# Patient Record
Sex: Female | Born: 1955 | Race: White | Hispanic: No | Smoking: Former smoker
Health system: Southern US, Community
[De-identification: ages and names within clinical notes are randomized; demographics above are authoritative.]

## PROBLEM LIST (undated history)

## (undated) DIAGNOSIS — E119 Type 2 diabetes mellitus without complications: Secondary | ICD-10-CM

## (undated) DIAGNOSIS — J40 Bronchitis, not specified as acute or chronic: Secondary | ICD-10-CM

## (undated) DIAGNOSIS — Z9889 Other specified postprocedural states: Secondary | ICD-10-CM

## (undated) DIAGNOSIS — C801 Malignant (primary) neoplasm, unspecified: Secondary | ICD-10-CM

## (undated) DIAGNOSIS — I639 Cerebral infarction, unspecified: Secondary | ICD-10-CM

## (undated) DIAGNOSIS — K922 Gastrointestinal hemorrhage, unspecified: Secondary | ICD-10-CM

## (undated) DIAGNOSIS — N200 Calculus of kidney: Secondary | ICD-10-CM

## (undated) DIAGNOSIS — F32A Depression, unspecified: Secondary | ICD-10-CM

## (undated) DIAGNOSIS — F329 Major depressive disorder, single episode, unspecified: Secondary | ICD-10-CM

## (undated) DIAGNOSIS — J189 Pneumonia, unspecified organism: Secondary | ICD-10-CM

## (undated) DIAGNOSIS — K219 Gastro-esophageal reflux disease without esophagitis: Secondary | ICD-10-CM

## (undated) DIAGNOSIS — K859 Acute pancreatitis without necrosis or infection, unspecified: Secondary | ICD-10-CM

## (undated) DIAGNOSIS — I10 Essential (primary) hypertension: Secondary | ICD-10-CM

## (undated) DIAGNOSIS — E7800 Pure hypercholesterolemia, unspecified: Secondary | ICD-10-CM

## (undated) DIAGNOSIS — G4730 Sleep apnea, unspecified: Secondary | ICD-10-CM

## (undated) DIAGNOSIS — T4145XA Adverse effect of unspecified anesthetic, initial encounter: Secondary | ICD-10-CM

## (undated) DIAGNOSIS — Z86010 Personal history of colonic polyps: Secondary | ICD-10-CM

## (undated) DIAGNOSIS — K3189 Other diseases of stomach and duodenum: Secondary | ICD-10-CM

## (undated) HISTORY — PX: HERNIA REPAIR: SHX51

## (undated) HISTORY — PX: APPENDECTOMY: SHX54

## (undated) HISTORY — PX: CHOLECYSTECTOMY: SHX55

## (undated) HISTORY — DX: Calculus of kidney: N20.0

---

## 1986-10-05 HISTORY — PX: TONSILLECTOMY AND ADENOIDECTOMY: SUR1326

## 1998-11-22 ENCOUNTER — Inpatient Hospital Stay (HOSPITAL_COMMUNITY): Admission: EM | Admit: 1998-11-22 | Discharge: 1998-11-25 | Payer: Self-pay | Admitting: Emergency Medicine

## 1998-11-22 ENCOUNTER — Encounter: Payer: Self-pay | Admitting: Internal Medicine

## 1998-11-22 ENCOUNTER — Encounter: Payer: Self-pay | Admitting: Emergency Medicine

## 1998-11-28 ENCOUNTER — Ambulatory Visit (HOSPITAL_COMMUNITY): Admission: RE | Admit: 1998-11-28 | Discharge: 1998-11-28 | Payer: Self-pay | Admitting: Gastroenterology

## 1999-01-15 ENCOUNTER — Ambulatory Visit (HOSPITAL_COMMUNITY): Admission: RE | Admit: 1999-01-15 | Discharge: 1999-01-15 | Payer: Self-pay | Admitting: Gastroenterology

## 1999-01-15 ENCOUNTER — Encounter: Payer: Self-pay | Admitting: Gastroenterology

## 1999-11-07 ENCOUNTER — Emergency Department (HOSPITAL_COMMUNITY): Admission: EM | Admit: 1999-11-07 | Discharge: 1999-11-07 | Payer: Self-pay | Admitting: Emergency Medicine

## 2000-10-05 HISTORY — PX: ABDOMINAL HYSTERECTOMY: SHX81

## 2000-10-26 ENCOUNTER — Inpatient Hospital Stay (HOSPITAL_COMMUNITY): Admission: EM | Admit: 2000-10-26 | Discharge: 2000-10-30 | Payer: Self-pay | Admitting: Emergency Medicine

## 2000-10-26 ENCOUNTER — Encounter: Payer: Self-pay | Admitting: Emergency Medicine

## 2000-10-30 ENCOUNTER — Inpatient Hospital Stay (HOSPITAL_COMMUNITY): Admission: EM | Admit: 2000-10-30 | Discharge: 2000-10-31 | Payer: Self-pay | Admitting: Emergency Medicine

## 2000-11-04 ENCOUNTER — Ambulatory Visit (HOSPITAL_BASED_OUTPATIENT_CLINIC_OR_DEPARTMENT_OTHER): Admission: RE | Admit: 2000-11-04 | Discharge: 2000-11-04 | Payer: Self-pay | Admitting: Pulmonary Disease

## 2000-11-16 ENCOUNTER — Emergency Department (HOSPITAL_COMMUNITY): Admission: EM | Admit: 2000-11-16 | Discharge: 2000-11-16 | Payer: Self-pay | Admitting: Emergency Medicine

## 2000-11-30 ENCOUNTER — Encounter (INDEPENDENT_AMBULATORY_CARE_PROVIDER_SITE_OTHER): Payer: Self-pay

## 2000-11-30 ENCOUNTER — Ambulatory Visit (HOSPITAL_COMMUNITY): Admission: RE | Admit: 2000-11-30 | Discharge: 2000-11-30 | Payer: Self-pay | Admitting: Gastroenterology

## 2001-03-09 ENCOUNTER — Encounter: Payer: Self-pay | Admitting: Emergency Medicine

## 2001-03-09 ENCOUNTER — Emergency Department (HOSPITAL_COMMUNITY): Admission: EM | Admit: 2001-03-09 | Discharge: 2001-03-09 | Payer: Self-pay | Admitting: Emergency Medicine

## 2001-06-21 ENCOUNTER — Encounter: Payer: Self-pay | Admitting: Anesthesiology

## 2001-06-23 ENCOUNTER — Encounter (INDEPENDENT_AMBULATORY_CARE_PROVIDER_SITE_OTHER): Payer: Self-pay | Admitting: Specialist

## 2001-06-23 ENCOUNTER — Ambulatory Visit (HOSPITAL_COMMUNITY): Admission: RE | Admit: 2001-06-23 | Discharge: 2001-06-23 | Payer: Self-pay | Admitting: Urology

## 2002-02-09 ENCOUNTER — Inpatient Hospital Stay (HOSPITAL_COMMUNITY): Admission: EM | Admit: 2002-02-09 | Discharge: 2002-02-14 | Payer: Self-pay | Admitting: Internal Medicine

## 2002-06-27 ENCOUNTER — Emergency Department (HOSPITAL_COMMUNITY): Admission: EM | Admit: 2002-06-27 | Discharge: 2002-06-27 | Payer: Self-pay | Admitting: Emergency Medicine

## 2002-10-12 ENCOUNTER — Ambulatory Visit (HOSPITAL_COMMUNITY): Admission: RE | Admit: 2002-10-12 | Discharge: 2002-10-12 | Payer: Self-pay | Admitting: *Deleted

## 2002-10-12 ENCOUNTER — Encounter: Payer: Self-pay | Admitting: *Deleted

## 2002-11-02 ENCOUNTER — Encounter: Payer: Self-pay | Admitting: *Deleted

## 2002-11-03 ENCOUNTER — Ambulatory Visit (HOSPITAL_COMMUNITY): Admission: RE | Admit: 2002-11-03 | Discharge: 2002-11-04 | Payer: Self-pay | Admitting: *Deleted

## 2002-11-03 ENCOUNTER — Encounter (INDEPENDENT_AMBULATORY_CARE_PROVIDER_SITE_OTHER): Payer: Self-pay | Admitting: *Deleted

## 2002-12-12 ENCOUNTER — Emergency Department (HOSPITAL_COMMUNITY): Admission: EM | Admit: 2002-12-12 | Discharge: 2002-12-12 | Payer: Self-pay | Admitting: Emergency Medicine

## 2002-12-12 ENCOUNTER — Encounter: Payer: Self-pay | Admitting: Emergency Medicine

## 2002-12-29 ENCOUNTER — Encounter: Admission: RE | Admit: 2002-12-29 | Discharge: 2002-12-29 | Payer: Self-pay | Admitting: Family Medicine

## 2002-12-29 ENCOUNTER — Encounter: Payer: Self-pay | Admitting: Gastroenterology

## 2003-01-10 ENCOUNTER — Other Ambulatory Visit: Admission: RE | Admit: 2003-01-10 | Discharge: 2003-01-10 | Payer: Self-pay | Admitting: Gastroenterology

## 2003-01-16 ENCOUNTER — Encounter: Payer: Self-pay | Admitting: Gastroenterology

## 2003-01-16 ENCOUNTER — Ambulatory Visit (HOSPITAL_COMMUNITY): Admission: RE | Admit: 2003-01-16 | Discharge: 2003-01-16 | Payer: Self-pay | Admitting: Gastroenterology

## 2003-06-13 ENCOUNTER — Observation Stay (HOSPITAL_COMMUNITY): Admission: EM | Admit: 2003-06-13 | Discharge: 2003-06-14 | Payer: Self-pay | Admitting: Emergency Medicine

## 2003-06-13 ENCOUNTER — Encounter: Payer: Self-pay | Admitting: *Deleted

## 2003-06-13 ENCOUNTER — Encounter: Payer: Self-pay | Admitting: Emergency Medicine

## 2004-08-15 ENCOUNTER — Ambulatory Visit: Payer: Self-pay | Admitting: Family Medicine

## 2004-08-25 ENCOUNTER — Ambulatory Visit: Payer: Self-pay | Admitting: Family Medicine

## 2004-09-22 ENCOUNTER — Ambulatory Visit: Payer: Self-pay | Admitting: Family Medicine

## 2004-10-22 ENCOUNTER — Ambulatory Visit: Payer: Self-pay | Admitting: Family Medicine

## 2004-11-04 ENCOUNTER — Ambulatory Visit: Payer: Self-pay | Admitting: Family Medicine

## 2005-01-01 ENCOUNTER — Ambulatory Visit: Payer: Self-pay | Admitting: Family Medicine

## 2005-03-04 ENCOUNTER — Ambulatory Visit: Payer: Self-pay | Admitting: Family Medicine

## 2005-03-31 ENCOUNTER — Ambulatory Visit: Payer: Self-pay | Admitting: Family Medicine

## 2005-04-13 ENCOUNTER — Ambulatory Visit: Payer: Self-pay | Admitting: Family Medicine

## 2005-05-12 ENCOUNTER — Ambulatory Visit: Payer: Self-pay | Admitting: Family Medicine

## 2005-05-26 ENCOUNTER — Ambulatory Visit: Payer: Self-pay | Admitting: Family Medicine

## 2005-06-03 ENCOUNTER — Ambulatory Visit: Payer: Self-pay | Admitting: Cardiology

## 2005-07-17 ENCOUNTER — Ambulatory Visit: Payer: Self-pay | Admitting: Family Medicine

## 2005-08-04 ENCOUNTER — Ambulatory Visit: Payer: Self-pay | Admitting: Family Medicine

## 2005-10-29 ENCOUNTER — Ambulatory Visit: Payer: Self-pay | Admitting: Family Medicine

## 2005-11-16 ENCOUNTER — Ambulatory Visit: Payer: Self-pay | Admitting: Family Medicine

## 2006-01-05 ENCOUNTER — Ambulatory Visit: Payer: Self-pay | Admitting: Family Medicine

## 2006-02-18 ENCOUNTER — Ambulatory Visit: Payer: Self-pay | Admitting: Family Medicine

## 2006-04-12 ENCOUNTER — Ambulatory Visit: Payer: Self-pay | Admitting: Family Medicine

## 2006-05-06 ENCOUNTER — Ambulatory Visit: Payer: Self-pay | Admitting: Family Medicine

## 2006-06-15 ENCOUNTER — Ambulatory Visit: Payer: Self-pay | Admitting: Family Medicine

## 2006-08-11 ENCOUNTER — Ambulatory Visit: Payer: Self-pay | Admitting: Family Medicine

## 2006-09-24 ENCOUNTER — Ambulatory Visit: Payer: Self-pay | Admitting: Family Medicine

## 2006-12-06 ENCOUNTER — Ambulatory Visit: Payer: Self-pay | Admitting: Family Medicine

## 2006-12-24 ENCOUNTER — Ambulatory Visit: Payer: Self-pay | Admitting: Family Medicine

## 2007-02-10 ENCOUNTER — Ambulatory Visit: Payer: Self-pay | Admitting: Family Medicine

## 2007-03-07 ENCOUNTER — Ambulatory Visit: Payer: Self-pay | Admitting: Family Medicine

## 2007-08-17 ENCOUNTER — Ambulatory Visit (HOSPITAL_COMMUNITY): Admission: RE | Admit: 2007-08-17 | Discharge: 2007-08-17 | Payer: Self-pay | Admitting: Family Medicine

## 2009-04-25 ENCOUNTER — Emergency Department (HOSPITAL_COMMUNITY): Admission: EM | Admit: 2009-04-25 | Discharge: 2009-04-25 | Payer: Self-pay | Admitting: Emergency Medicine

## 2009-07-05 ENCOUNTER — Emergency Department (HOSPITAL_COMMUNITY): Admission: EM | Admit: 2009-07-05 | Discharge: 2009-07-05 | Payer: Self-pay | Admitting: Emergency Medicine

## 2011-01-11 LAB — BASIC METABOLIC PANEL WITH GFR
BUN: 10 mg/dL (ref 6–23)
CO2: 30 meq/L (ref 19–32)
Calcium: 9.7 mg/dL (ref 8.4–10.5)
Chloride: 102 meq/L (ref 96–112)
Creatinine, Ser: 0.57 mg/dL (ref 0.4–1.2)
GFR calc non Af Amer: >60 mL/min
Glucose, Bld: 124 mg/dL — ABNORMAL HIGH (ref 70–99)
Potassium: 3.7 meq/L (ref 3.5–5.1)
Sodium: 143 meq/L (ref 135–145)

## 2011-01-11 LAB — URINALYSIS, ROUTINE W REFLEX MICROSCOPIC
Bilirubin Urine: NEGATIVE
Glucose, UA: NEGATIVE mg/dL
Ketones, ur: NEGATIVE mg/dL
Nitrite: NEGATIVE
Protein, ur: NEGATIVE mg/dL
Specific Gravity, Urine: 1.01 (ref 1.005–1.030)
Urobilinogen, UA: 0.2 mg/dL (ref 0.0–1.0)
pH: 6 (ref 5.0–8.0)

## 2011-01-11 LAB — DIFFERENTIAL
Basophils Absolute: 0 10*3/uL (ref 0.0–0.1)
Basophils Relative: 0 % (ref 0–1)
Eosinophils Absolute: 0.1 10*3/uL (ref 0.0–0.7)
Eosinophils Relative: 1 % (ref 0–5)
Lymphocytes Relative: 27 % (ref 12–46)
Lymphs Abs: 1.8 10*3/uL (ref 0.7–4.0)
Monocytes Absolute: 0.4 10*3/uL (ref 0.1–1.0)
Monocytes Relative: 6 % (ref 3–12)
Neutro Abs: 4.4 10*3/uL (ref 1.7–7.7)
Neutrophils Relative %: 66 % (ref 43–77)

## 2011-01-11 LAB — CBC
Hemoglobin: 14.5 g/dL (ref 12.0–15.0)
MCHC: 34.3 g/dL (ref 30.0–36.0)
MCV: 86.5 fL (ref 78.0–100.0)
RBC: 4.88 MIL/uL (ref 3.87–5.11)

## 2011-02-20 NOTE — Op Note (Signed)
NAME:  Destiny Robinson, SACHS NO.:  [id number]   MEDICAL RECORD NO.:  [id number]                   PATIENT TYPE:  OIB   LOCATION:  2872                                 FACILITY:  [facility]   PHYSICIAN:  Veverly Fells. Arletha Grippe, M.D.             DATE OF BIRTH:  [birth year]   DATE OF PROCEDURE:  11/03/2002  DATE OF DISCHARGE:                                 OPERATIVE REPORT   PREOPERATIVE DIAGNOSIS:  Nasal airway obstruction, obstructive sleep apnea,  recurrent and persistent sinusitis involving the ethmoid and maxillary sinus  cavities.   POSTOPERATIVE DIAGNOSIS:  Nasal airway obstruction, obstructive sleep apnea,  recurrent and persistent sinusitis involving the ethmoid and maxillary sinus  cavities.   PROCEDURE:  Nasal septal reconstruction, bilateral endoscopic anterior  ethmoidectomies, and bilateral endoscopic maxillary antrostomies using the  Stealth Station for stereotactic assisted navigation.   SURGEON:  Veverly Fells. Arletha Grippe, M.D.   ANESTHESIA:  General endotracheal.   INDICATIONS FOR PROCEDURE:  This is a 55 year old white female with a  history of mild obstructive sleep apnea with significant oxygen  desaturations currently using nasal CPAP.  Does have a history of nasal  airway obstruction and does have a problem with recurrent sinusitis being on  numerous courses of antibiotics throughout the year.  Physical examination  in the office did show a significant septal deviation to the right with  mucosal thickening of the inferior turbinates bilaterally.  Post treatment  CT scan of the sinus showed minimal maxillary sinus disease.  Based on her  history and physical examination and history of recurrent sinusitis, I have  recommended proceeding with the above noted surgical procedure.  I have  discussed with her the risks and benefits of surgery including risks of  general anesthesia, infection, bleeding, CNS injury, need for septal  splinting, light nasal  packing, and a normal recovery period expected after  this type of surgery. I have entertained any questions, answered then  appropriately, informed consent has been obtained, and the patient presents  for above noted procedure.   FINDINGS:  Septal deviation right lateral in nature, some thickening of the  lining of the ethmoid and maxillary sinuses bilaterally.   DESCRIPTION OF PROCEDURE:  The patient was brought to the operating room and  placed in the supine position.  General endotracheal anesthesia administered  via the anesthesiologist without complications. The patient was administered  600 mg of Clindamycin IV x1 and 10 mg of Decadron IV x1.  The head of the  table was elevated 30 degrees.  A throat pack was placed in the posterior  pharynx using a small baby sponge, bilateral sphenopalatine blocks were  administered via the greater palatine foramen each with 1.5 ml of 1%  lidocaine solution with 1:100,000 epinephrine.  The Stealth Station Head  Piece was placed on the patient's head and was calibrated  to [employer]  suction probe and a [employer]  probe per protocol. This was used throughout  the entire case to identify laminar arch and lamina, and the patient's skull  base which were not violated at any time.   The patient's face was draped in a standard fashion.  Pledgets soaked in a  4% cocaine solution were placed in both nares and left in place for  approximately 5 to 10 minutes and then removed.  Both sites septum were  infiltrated with 1% lidocaine solution with 1:100,000 epinephrine.  After  waiting approximately 10 minutes a standard Seldinger incision was made on  the left side of the septum, mucoperichondrial and mucoperiosteal flap was  elevated on the left side using both blunt and sharp dissection.  An  intercartilaginous incision was made approximately 1 cm posterior from the  caudal limb of the septum.  The mucoperichondrial and mucoperiosteal flap  was elevated on  the right side using both biopsies.  Cartilaginous deviation  was removed and posterior bony deflection removed with Jansen-Middleton  forceps.  Septal flaps were placed back in normal position using 0 degree  ridge endoscopic guidance.  The anterior portion of both middle turbinates  and uncinate processes were injected with a 1% lidocaine solution with  1:1000 epinephrine.  Cotton pledgets soaked in a 4% cocaine solution were  placed in both middle meatus which were left in place approximately 5 to 10  minutes and then removed.  Next, attention was turned to the left nasal  chamber.  Using 0 degree ridge endoscopic guidance, the uncinate process was  back elevated using the micro elevator and uncinate performed with  combination of two cutting forceps and the micro debrider without  difficulty.  The antral sinus ostium and anterior sinus was identified and  was enlarged using through cutting forceps and the micro debrider.  Some  thick secretions were removed through the enlarged ostium with suction  without difficulty.  A 30 and then a 70 degree rigid endoscope was used to  identify the contents of the maxillary sinus.  No other lesions or masses  were noted and no further biopsies were taken.  Next using through cutting  forceps, micro debrider, 0 degree endoscopic guidance, and the Stealth  Station for guidance, an ethmoidectomy was performed by removing the ethmoid  bulla up to skull base.  Since there was no significant disease posteriorly,  a posterior ethmoidectomy was not performed.  Next, attention was turned to  the right nasal chamber. Identical procedure was carried out on this side  compared to the left side with identical results.  After this was done,  sinus cavities were reinspected and there was no evidence of any active  bleeding.  A piece of trimmed morselized cartilage was placed in between the septal flaps. Septal incision was closed with interrupted chromic suture  and  the septum was reinforced with a running septal plain gut mattress stitch.  A pack soaked in a Bactroban ointment solution placed in both middle meatus.  Both inferior turbinates were injected with a total of 6 ml of 1% lidocaine  solution with 1:1000 epinephrine and both inferior turbinates were then  intramurally cauterized using the Elmed instrument cauterization unit using  a 12 watt setting.  Three passes of both inferior turbinates performed  without difficulty and both inferior turbinates were then outfractured using  gentle pressure with a large nasal speculum.  Doyle foam soaked in a  Bactroban ointment solution was placed on either side of the septum and held  in place  with a trans septal Prolene suture. Throat pack was removed.  Oral  gastric tube was placed and was used to decompress the stomach contents.  It  was then removed without incident.  Fluids given during the procedure was  approximately 1500 ml of crystalloid. Estimated blood loss was less than 50  ml.  There were no drains.  Two Doyle splints were placed.  Specimens sent  were sinus contents for culture, sensitivity, and pathology.  The patient  tolerated the procedure well without complications, was extubated in the  operating room, and transferred to the recovery room in stable condition.  Needle, sponge, and instrument count correct.  Total duration of the  procedure was approximately two hours.  The patient will be admitted for  overnight recovery.  Once she recovers well, she will be sent home on  November 04, 2002.  She will be sent home on Levaquin 500 mg p.o. daily for  14 days and Vicodin #30 with two refills one to two tablets p.o. q.4h p.r.n.  pain. She is to have light activity, no heavy lifting or nose blowing for  two weeks after surgery. Both  her and her family are given oral and written instructions. They are to call  with any problems with bleeding, fever, or vomiting, pain, medications, or   any other questions. She will follow up in the office for splint pack  removal and bilateral endoscopic debridement of sinus cavities on Thursday,  February 5, at 3:25 p.m.                                               Veverly Fells. Arletha Grippe, M.D.    MDR/MEDQ  D:  11/03/2002  T:  11/03/2002  Job:  [id number]   cc:   Delaney Meigs, M.D.  [address]  [place]  [place] [address]  Fax: [phone]   E. Verdis Prime., M.D.  [address] [place]  [place] [address]  Fax: [phone]

## 2011-02-20 NOTE — Op Note (Signed)
[place]. [facility]  Patient:    Destiny Robinson, Destiny Robinson Visit Number: [id number] MRN: [id number]          Service Type: DSU Location: DAY Attending Physician:  Laqueta Jean Dictated by:   Vonzell Schlatter Patsi Sears, M.D. Admit Date:  06/23/2001                             Operative Report  PREOPERATIVE DIAGNOSIS:  Interstitial cystitis.  POSTOPERATIVE DIAGNOSIS:  Interstitial cystitis.  OPERATION:  Cystourethroscopy, urethral dilation, hydrodistention of the bladder, bladder biopsy, insertion of Marcaine and Pyridium in the bladder, injection of Marcaine and Kenalog in the periurethral space.  SURGEON:  Sigmund I. Patsi Sears, M.D.  ANESTHESIA:  General endotracheal anesthesia.  REVIEW OF HISTORY:  [name] is a 55 year old, obese female, weighing 210 pounds, complaining of continuous "pelvic cramping," with pain, spasm, for cysto, hydrodistention and bladder biopsy today.  She also has dyspareunia with specific pain in the top of the urethra, the back by the base of the bladder.  Her past history is significant for non-insulin-dependent diabetes, hypertension, sleep apnea, and GERD.  DESCRIPTION OF PROCEDURE:  After appropriate preanesthesia, the patient was brought to the operating room and placed on the operating table in the dorsal supine position where general endotracheal anesthesia was introduced. She was then replaced in the dorsal lithotomy position with the pubis was prepped with Betadine solution and draped in the usual fashion.  Urethral dilation was accomplished to a size 30-French urethral sounds and following this, cysto HRD was accomplished with 1000 cc bladder capacity. Following hydrodistention, bladder biopsy was accomplished and cauterized with the Bugbee electrode.  Pyridium and Marcaine was inserted into the bladder and and Marcaine and Kenalog were injected at the periurethral space.  A vaginal pack was placed which  will  be removed in the recovery room.  The patient was then awakened after being given Toradol and taken to the recovery room in good condition. Dictated by:   Vonzell Schlatter Patsi Sears, M.D. Attending Physician:  Laqueta Jean DD:  06/23/01 TD:  06/23/01 Job: [id number]

## 2011-02-20 NOTE — H&P (Signed)
[facility]  Patient:    Destiny Robinson, Destiny Robinson Visit Number: [id number] MRN: [id number]          Service Type: MED Location: [phone] Attending Physician:  Avie Echevaria Dictated by:   Charlaine Dalton. Sherene Sires, M.D. [facility] Admit Date:  02/09/2002   CC:         Dr. Lyndal Rainbow L. Annalee Genta, M.D.   History and Physical  CHIEF COMPLAINT:  Cough and fever.  HISTORY OF PRESENT ILLNESS:  This is a complicated 55 year old white female, former smoker, seen by me in the office on January 27, 2002, with a refractory cough that had developed in the setting of what initially was thought to be a "sinus infection" on December 12, 2001.  However, she received three different rounds of antibiotics and was seen by both Dr. Zion Callas and Dr. Arletha Grippe, and she reports that she has been told she has no evidence of active sinusitis.  I had evaluated her and found that she had a very functional upper airway cough with no evidence of definite active purulent infection.  On her third round of antibiotics at that time I recommended that she finish the antibiotics and not continue them.  Instead, I focused on management of the upper airway with high-dose Prevacid and made an effort to try to control what I considered to be a cyclical component of the cough.  She states that on this regimen she never completely stopped coughing, but it is not clear to me she actually followed the instructions that she was given.  For instance, she did not recognize several of the medicines that I had placed her on.  She comes back now much worse over the last 48 hours with hacking cough, generalized chest discomfort anteriorly with coughing, and coughing to the point that she vomits.  She also had fever of 101, she reports, last night.  Given the fact that she has just received three different rounds of antibiotics and the cough never really resolved with outpatient therapy, I feel she has failed outpatient  therapy and needs more aggressive inpatient measures at this time.  She denies any true pleuritic pain at this point, any abdominal complaints, active sinus pain, rigors, myalgias, arthralgias, or sore throat.  PAST MEDICAL HISTORY:  Obesity, in turn complicated by both diabetes and obstructive sleep apnea, on BiPAP.  She is also status post cholecystectomy and tonsillectomy.  Also with documented GERD with active esophagitis by most recent upper endoscopy dated here in the office on November 30, 2000.  MEDICATIONS:  1. Hydrochlorothiazide 25 mg q.d.  2. Cozaar 100 mg q.d.  3. Norvasc 5 mg q.d.  4. Lipitor 20 mg q.d.  5. Avandia 4 mg q.d.  6. Calcium 600 mg b.i.d.  7. Multivitamins daily.  8. Estraderm patches twice weekly.  9. Celexa 30 mg q.d. 10. Singulair 10 mg q.d. 11. Astelin 1 puff b.i.d. 12. Rhinocort 1 puff q.d. 13. Stool softeners daily. 14. She was supposed to be using Tussi-12 one q.12h., Drixoral Cold and     Allergy p.r.n. postnasal drainage, Mepergan 1 q.4h. p.r.n. excessive     cough, but does not think she actually received the Mepergan and, instead,     taking a cough syrup that "doesnt work" and not able to identify it.  SOCIAL HISTORY:  She quit smoking five years ago.  She works as a Lawyer at [employer].  She denies any unusual travel, pet, or hobbies.  FAMILY HISTORY:  Positive  for heart disease in her brother and mother. Negative for any form of atopy or asthma to her knowledge.  REVIEW OF SYSTEMS:  Taken in detail and essentially negative except for weight gain that she attributes to steroid exposure.  PHYSICAL EXAMINATION:  GENERAL:  Obese white female in no acute distress, with a very harsh, hacking upper airway cough that is almost a "bark."  VITAL SIGNS:  Most recent weight in our records is 217 pounds, compared to 205 when we first saw her in the pulmonary clinic in 2002.  HEENT:  Oropharynx is clear.  No evidence of active postnasal drainage  or cobblestoning.  NECK:  Supple.  Without cervical adenopathy or tenderness. Trachea midline. No thyromegaly.  LUNGS:  Lung fields reveal a few rhonchi bilaterally.  Air movement is adequate.  However, when she tries to breathe in she immediately goes into coughing fits.  HEART:  Regular rate and rhythm without murmur, gallop, or rub present.  ABDOMEN:  Soft, benign.  EXTREMITIES:  Warm, without calf tenderness, cyanosis, clubbing, or edema.  NEUROLOGIC:  No focal deficits ______ although her mood and affect were both anxious and depressed.  SKIN:  Warm and dry.  LABORATORY DATA:  Chest x-ray and sinus CT scan as well as laboratory work are all pending at the time of this dictation.  IMPRESSION:  Refractory chronic cough that really was not well controlled previously and probably was upper airway in nature (either related to poor control with reflux and/or rhinitis and/or both).  She is now much worse and will need admission to the hospital to work out the cause of fever, which most presumably is related to purulent sinusitis.  Although I do not have a chest x-ray, yet I strongly doubt that she has pneumonia.  Long-term, the major task ahead is to control the cough without the need for systemic steroids and to have her lose weight, which is the most likely reason she would become refractory to outpatient management for what I presume to be upper airway instability related to reflux.  The other possibility is that she needs more aggressive treatment of her sinuses, but will wait and see what the sinus evaluation yields.  For further information, please see orders. Dictated by:   Charlaine Dalton. Sherene Sires, M.D. [facility] Attending Physician:  Avie Echevaria DD:  02/09/02 TD:  02/11/02 Job: [phone] [id number]

## 2011-02-20 NOTE — Discharge Summary (Signed)
[place]. [facility]  Patient:    Destiny Robinson, Destiny Robinson                         MRN: [id number] Adm. Date:  [id number] Disc. Date: [id number] Attending:  Talitha Givens Dictator:   Dian Queen, P.A.C. [facility] CC:         Delaney Meigs, M.D.   Referring Physician Discharge [place]  HISTORY OF PRESENT ILLNESS:  Essentially the problem is that of a 55 year old, white, female diabetic with hypertension and hyperlipoproteinemia.  She quit smoking many years ago.  She is admitted now with sudden onset of sharp substernal chest pain beginning yesterday at work while walking, at which time she became very short of breath and had to sit down.  She tried to continue at work.  She has had some spells while driving.  She has spells noted by her husband when she seems to be unresponsive for a few seconds, during which time there is no apparent seizure activity.  She does not stop breathing.  There is a question of sleep apnea that has been raised in the past.  She is admitted for further evaluation and therapy.  LABORATORY WORK:  Blood gas revealing a pH of 7.44 with a pCO2 of 43 and a pO2 of 68.0 on 2 L.  Hemoglobin 13.3 with a hematocrit of 38.7 and white count of 7000.  Electrolytes and renal function totally normal.  CKs were low with negative MBs.  Troponins were normal.  The TSH was 0.441.  Helicobacter antibody 3.26.  Myocardial perfusion study revealed changes compatible with anterior wall ischemia with normal wall motion and ejection fraction of 55%.  A portable chest x-ray revealed no active disease.  A 2-D echocardiogram revealed normal valvular architecture and normal LV function.  Assessment of the right heart was difficult.  Lower extremity venous Dopplers were normal with no evidence of DVT.  HOSPITAL COURSE:  She was seen in consultation by pulmonary who felt that she had significant symptoms compatible with GERD.  A CT of the chest was obtained, which was  negative for pulmonary embolization.  It was felt that she probably had sleep apnea versus perhaps narcolepsy.  A sleep study was recommended.  She was advised not to drive until such was completed and further therapies outlined.  Because of her GERD symptoms, she was seen in consultation by gastroenterology.  Proton pump inhibitor was recommended, although they felt that much of her symptoms were probably musculoskeletal rather than GI in etiology.  To fully evaluate the potential for cardiac involvement, it was ultimately felt optimal to proceed with cardiac catheterization given the Cardiolite data as above.  She therefore underwent cardiac catheterization by Rollene Rotunda, M.D., in October 29, 2000, which revealed normal coronaries and normal LV function.  FINAL DIAGNOSES: 1. Noncardiac chest pain.  Normal coronary arteries and left ventricular    function.  No evidence of pulmonary embolization. 2. Suspected obstructed sleep apnea. 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 4. Diabetes. 5. Controlled hypertension. 6. Hyperlipoproteinemia. 7. Family history of coronary artery disease. 8. Remote history of cigarette use, quit years ago.  DISPOSITION:  The patient is discharged home.  DISCHARGE MEDICATIONS: 1. HCTZ 25 mg q.d. 2. Lipitor 20 mg q.d. 3. Avandia 4 mg q.d. 4. Norvasc 5 mg q.d. 5. Cozaar 100 mg q.d. 6. Climara patch 0.25 mg. 7. Protonix 40 mg b.i.d.  FOLLOW-UP:  She will with Delaney Meigs, M.D., long term, sleep  study as above, and then with Barbaraann Share, M.D., for therapy option discussion. DD:  10/30/00 TD:  10/30/00 Job: [id number]

## 2011-02-20 NOTE — Discharge Summary (Signed)
[place]. [facility]  Patient:    Destiny Robinson, Destiny Robinson                         MRN: [id number] Adm. Date:  [id number] Disc. Date: [id number] Attending:  Talitha Givens Dictator:   Tereso Newcomer, P.A. CC:         [facility]  Delaney Meigs, M.D.   Discharge Summary  DATE OF BIRTH:  [birth year]  DISCHARGE DIAGNOSES:  1. Right groin hematoma.  2. Diabetes mellitus type 2.  3. Hypertension.  4. Hyperlipidemia.  5. Obesity.  6. Hypoxia.  7. Hormone replacement therapy.  8. Remote tobacco use.  9. Gastroesophageal reflux disease. 10. Family history of coronary artery disease.  HOSPITAL COURSE:  This 55 year old female had recently been discharged from [facility] on October 30, 2000, after being worked up for chest pain and dyspnea.  Her workup included a cardiac catheterization.  She went home on October 30, 2000, and developed acute pain in her right groin and developed a hematoma.  She came to the emergency room and Dr. Myrtis Ser saw her in the emergency room and held pressure to her right femoral artery for 20 minutes.  She improved in the emergency room.  She was kept overnight in the hospital for observation and bed rest for six hours.  On the morning of October 31, 2000, she was much better.  There was no bruit in her groin and Dr. Myrtis Ser felt it was okay to discharge her to home.  DISPOSITION:  She is to follow up as previously scheduled.  WOUND CARE:  She is to call our office with any concerns over groin swelling, bleeding, or bruising. DD:  11/15/00 TD:  11/15/00 Job: [id number]

## 2011-02-20 NOTE — Discharge Summary (Signed)
Destiny Robinson, Destiny Robinson NO.:  [id number]   MEDICAL RECORD NO.:  [id number]                   PATIENT TYPE:  INP   LOCATION:  4731                                 FACILITY:  [facility]   PHYSICIAN:  Rollene Rotunda, M.D.                DATE OF BIRTH:  1956-03-26   DATE OF ADMISSION:  06/13/2003  DATE OF DISCHARGE:  06/14/2003                           DISCHARGE SUMMARY - REFERRING   DISCHARGE DIAGNOSES:  1. Syncope.  2. Chest pain.  3. Headache.  4. Palpitations.  5. Diabetes mellitus, oral agents.  6. Hypertension.  7. Hyperlipidemia.  8. Asthma.  9. Allergies.  10.      Hyperlipidemia.   HOSPITAL COURSE:  Destiny Robinson is a 55 year old female who developed a  severe headache about 2 a.m. the date of admission while working. She took  two full strength aspirin and two hours later had a progressive dyspnea  associated with palpitations and sudden syncope. With her headache, she  noted bilateral blurred visual changes. She had a similar episode in January  2002 which resulted in cardiac catheterization which revealed normal  coronary arteries with a normal EF. After her episode on June 13, 2003,  she went to see Dr. Lysbeth Galas who sent the patient to the emergency room at  [facility] for cardiac evaluation. Part of this  evaluation included CT which was negative for acute bleed. Because of the  stabbing chest pain and shortness of breath, we did perform a spiral CT  which revealed no PE. She ruled out for MI by enzymes and the following day  was ready for discharge to home. The patient states she has been under  significant stress and believes that this is the reason for her syncope. We  did keep her telemetry unit during her hospitalization, and this revealed no  dysrhythmias.   LABORATORY DATA:  Lab studies during her hospital stay include sodium 140,  potassium 3.4 replaced, BUN 16, creatinine 0.8. Cardiac isoenzymes negative.  TSH 1.521.   DISPOSITION:  The patient is discharged to home in stable condition.   DISCHARGE MEDICATIONS:  She is to continue her home medications which  include:  1. Tricor 160 mg a day.  2. Lipitor 10 mg a day.  3. Avandia 4 mg a day.  4. Cozaar 100 mg a day.  5. Norvasc 5 mg a day.  6. Effexor XR 75 mg a day.  7. Potassium 10 mEq a day.  8. Albuterol as needed.  9. Zyrtec 10 mg a day.  10.      Singulair 10 mg a day.  11.      Advair 100/50 one inhalation b.i.d.  12.      She may utilize Tylenol one to two tablets as needed for pain.   ALLERGIES:  Her allergies are PENICILLIN, CODEINE, SULFA, and PREDNISONE.   ACTIVITY:  No strenuous activity  for one to two days, and then she is to  gradually increase activity. She is to return to work on June 18, 2003.   DIET:  She is remain on a low fat, diabetic diet.   FOLLOW UP:  She is to give [place] a call for any questions or concerns. She  needs to go ahead and make a followup appointment with Dr. Lysbeth Galas within the  next several weeks.      Guy Franco, P.A. [facility]                      Rollene Rotunda, M.D.    LB/MEDQ  D:  06/14/2003  T:  06/14/2003  Job:  [id number]   cc:   Delaney Meigs, M.D.  [address]  [place]  [place] [address]  Fax: [phone]

## 2011-02-20 NOTE — Cardiovascular Report (Signed)
. [facility]  Patient:    Destiny Robinson, Destiny Robinson                         MRN: [id number] Proc. Date: 10/29/00 Adm. Date:  [id number] Disc. Date: [id number] Attending:  Talitha Givens                        Cardiac Catheterization  DATE OF BIRTH:  16-Mar-1956  PROCEDURE:  Left heart catheterization/coronary arteriography.  CARDIOLOGIST:  Rollene Rotunda, M.D. [facility]  INDICATION:  Evaluate patient with chest pain and a Cardiolite suggesting anterior ischemia.  PROCEDURAL NOTE:  Left heart catheterization was performed via the right femoral artery.  The artery was cannulated using anterior wall puncture.  A #6 French arterial sheath was inserted via the modified Seldinger technique. Preformed Judkins and a pigtail catheter were utilized.  The patient tolerated the procedure well and left the lab in stable condition.  RESULTS:  HEMODYNAMICS:  LV: 137/20,  AO: 140/87.  CORONARIES:  left main is normal.  The LAD is normal.  The circumflex is normal.  The right coronary artery is normal.  LEFT VENTRICULOGRAM:  A left ventriculogram was obtained in the RAO projection.  The EF was 65%.  CONCLUSION: 1. Normal coronary arteries. 2. Normal left ventricular function.  PLAN:  No further cardiovascular workup is planned.  There does not appear to be a cardiac etiology to her chest pain.  She will follow up with primary care physician for evaluation and management of this. DD:  10/29/00 TD:  10/30/00 Job: [id number]

## 2011-02-20 NOTE — Discharge Summary (Signed)
[facility]  Patient:    Destiny Robinson, Destiny Robinson Visit Number: [id number] MRN: [id number]          Service Type: MED Location: [phone] Attending Physician:  Destiny Robinson Dictated by:   Destiny Favor, RN, MSN, ACNP Admit Date:  02/09/2002 Discharge Date: 02/14/2002   CC:         Destiny Robinson, D.O., Destiny Robinson, Destiny Robinson  Destiny Robinson, M.D., Destiny Robinson, Destiny Robinson  Destiny Robinson. Destiny Robinson, M.D.   Discharge Summary  DISCHARGE DIAGNOSES: 1. Refractory cough. 2. Purulent sputum. 3. Diabetes mellitus, adult-onset.  HISTORY OF PRESENT ILLNESS:  Destiny Robinson is a complicated 55 year old white female former smoker followed by Dr. Sandrea Robinson, last seen in the office on January 27, 2002, with refractory cough that developed in the setting of what initially was thought to be a sinus infection on December 12, 2001. However, she had been through multiple antibiotics and was seen by Dr. Annalee Robinson and Dr. Arletha Robinson and reportedly did not have any evidence of active sinusitis.  She was evaluated and found to have a functional upper airway cough with no evidence of definite purulent infection.  Having proven refractory to outpatient treatment and presenting with a fever of 103, she was admitted for further evaluation in the inpatient setting, as due to the complexity of her care she had proven refractory to outpatient care.  LABORATORY DATA:  Respiratory culture showed normal oropharyngeal flora.  WBC 8.7, hemoglobin 13.0, hematocrit 38.1, platelets 332.  Sodium 140, potassium 3.4, chloride 102, CO2 29, glucose 146, BUN 11, creatinine 0.7, calcium 9.2. IgE serum was 142.5.  CT of the sinuses demonstrated a mild swelling of the left nasal turbinate, cannot fully evaluate it on limited study, but there appears to be no grossly unfavorable anatomy.  No air-fluid levels were seen.  Chest x-ray shows some suboptimal level of inspiration and a somewhat elevated left hemidiaphragm, with  no active disease.  HOSPITAL COURSE:  #1 -  REFRACTORY COUGH:  Ms. Destiny Robinson was admitted to Destiny Robinson and placed on voice rest along with high-dose narcotics for cough suppression.  She reached maximal hospital benefit on Feb 14, 2002, and was discharged home.  She will remain on modified voice rest along with cough suppressants.  Note that she will be placed on Mepergan Fortis p.r.n. along with Tussi-12 and Humibid DM for cough suppression on an outpatient basis.  Of note, she had Reglan and Humibid DM added to her pharmaceutical interventions.  #2 -  PURULENT SPUTUM:  She is on day 2 of 7 of Tequin.  She will be continued on Tequin for another five days on discharge.  #3 -  HYPERTENSION:  Hypertension was addressed with consideration of her antihypertensives.  #4 -  POSTMENOPAUSAL:  She continues with her Estraderm patch.  #5 -  DEPRESSION:  She remains on Celexa 30 mg a day.  #6 -  DIABETES MELLITUS:  She is doing well on her Avandia.  DISCHARGE INSTRUCTIONS:  Her diet is a low-fat, low-salt diet.  SPECIAL INSTRUCTIONS:  She is to bring all her medications and her medication list to the office.  She is to make an appointment to follow up with Dr. Sandrea Robinson in one week.  DISCHARGE MEDICATIONS: 1. Avandia 4 mg one q.d. 2. Cozaar 100 mg q.d. 3. Norvasc 5 mg q.d. 4. Hydrochlorothiazide 25 mg q.d. 5. Estraderm 0.05 mg patch twice weekly. 6. Celexa 30 mg q.d. 7. Lipitor 20 mg qd 8. Senokot S two  tablets daily at h.s. 9. Multivitamin one a day. 10. Calcium twice a day. 11. Tussi-12 two times a day. 12. Prevacid 30 mg two times a day 30 minutes prior to meals. 13. Humibid DM two tablets b.i.d. 14. Singulair 10 mg q.d. 15. Tequin 400 mg until gone q.d. 16. Rhinocort two puffs b.i.d. 17. Astelin two puffs h.s. 18. Reglan 10 mg a.c. and h.s.  P.R.N. MEDICATIONS: 1. For _____ can have albuterol puffer. 2. For drippy nose she can have _____ cold and allergy. 3.  For severe cough, Mepergan Fortis.  FOLLOW-UP:  She is to follow up again with Dr. Sherene Robinson in one week.  DISPOSITION/CONDITION ON DISCHARGE:  Her cough has improved.  She will be followed up in one week with Dr. Sherene Robinson for further streamlining of her medication counter. Dictated by:   Destiny Favor, RN, MSN, ACNP Attending Physician:  Destiny Robinson DD:  02/14/02 TD:  02/14/02 Job: [id number]

## 2011-02-20 NOTE — H&P (Signed)
NAMEREJINA, ODLE NO.:  [id number]   MEDICAL RECORD NO.:  [id number]                   PATIENT TYPE:  INP   LOCATION:  4731                                 FACILITY:  [facility]   PHYSICIAN:  Rollene Rotunda, M.D.                DATE OF BIRTH:  Aug 20, 1956   DATE OF ADMISSION:  06/13/2003  DATE OF DISCHARGE:                                HISTORY & PHYSICAL   PRIMARY CARE PHYSICIAN:  Delaney Meigs, M.D.   REASON FOR ADMISSION:  Evaluate patient with syncope.   HISTORY OF PRESENT ILLNESS:  The patient is a 55 year old white female whose  past cardiac history includes evaluation for chest pain in 2002.  At that  time, she had normal coronaries and normal left ventricular function.  She  presents now after an episode of syncope at work.  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  She said she developed a headache.  This was  occipital.  It was somewhat intense.  She then became somewhat nauseated.  She had a stabbing discomfort in her chest, traveling down into her left arm  and to her fingers.  She developed some shallow breathing.  She apparently  lost consciousness.  She said she was standing in a patient's room and had  to be helped to a chair.  She does not remember this event.  There was no  trauma.  There was no loss of bowel or bladder.  There was no reported  seizure activity.  When she came to, she knew where she was.  She did go  home after calling her husband to drive her home.  She was still hurting in  the chest, although the headache had resolved.  The chest discomfort slowly  resolved.  She presented to Dr. Lysbeth Galas and was referred to the emergency  room this morning.  She reports the headaches is returning.  She has had a  head CT which is negative for any intracranial events or masses.  She has  had a chest CT negative for pulmonary emboli.   The patient otherwise reports that she has been under quite a bit of stress.  She has a 27 year old daughter who is apparently difficult.  She is caring  for in-laws and her own father, who has medical problems.  (Her father has  severe vascular disease and is my patient.)  She says she has been able to  do activities of daily living without difficulty; however, she has lost 40  pounds through dieting.  She has actually felt fairly well.  She has not had  any recent chest discomfort, neck discomfort, arm discomfort on activity.  Denies nausea, vomiting, or excessive diaphoresis.  She does feel  palpitations, but has not had presyncope or syncope associated with this.   PAST MEDICAL HISTORY:  1. Asthma, allergies.  2. Diabetes  mellitus x 6 years.  3. Hypertension x 15 years.  4. Hyperlipidemia recently diagnosed.   PAST SURGICAL HISTORY:  1. Hysterectomy.  2. Cholecystectomy.  3. Tonsillectomy.  4. Sinus surgery.   ALLERGIES:  PENICILLIN, CODEINE, SULFA, and PREDNISONE.   CURRENT MEDICATIONS:  1. TriCor 160 mg daily.  2. Lipitor 10 mg daily.  3. Avandia 4 mg daily.  4. Cozaar 100 mg daily.  5. Norvasc 5 mg daily.  6. Effexor 75 mg daily.  7. Potassium 10 mEq daily.  8. Albuterol.  9. Zyrtec 10 mg daily.  10.      Singulair 10 mg daily.  11.      Advair 100/50 b.i.d.   SOCIAL HISTORY:  The patient lives with her husband.  She works at  [employer] in [place].  She does not smoke cigarettes.  She does  not drink alcohol.  She is married with two children.   FAMILY HISTORY:  Contributory for her father having coronary disease  apparently in his 40s.  Her mother had a myocardial infarction also in her  33s.   REVIEW OF SYSTEMS:  As stated in the HPI and negative for other systems.   PHYSICAL EXAMINATION:  GENERAL:  The patient is in no distress.  VITAL SIGNS:  Blood pressure 122/78, heart rate 82 and regular, afebrile.  HEENT:  Eyes unremarkable.  Pupils are equal, round, and reactive to light.  Fundi within normal limits.  Oral mucosa  unremarkable.  NECK:  No jugular venous distention.  Waveform within normal limits.  Carotid upstrokes brisk and symmetric.  No bruits, thyromegaly.  LYMPHATICS:  No cervical, axillary, or inguinal adenopathy.  LUNGS:  Clear to auscultation bilaterally.  BACK:  No costovertebral angle tenderness.  CHEST:  Unremarkable.  HEART:  PMI nondisplaced or sustained. S1 and S2 within normal limits.  No  S3, no S4, no murmurs.  ABDOMEN:  Obese.  Positive bowel sounds, normal in frequency and pitch.  No  bruits, no rebound, no guarding, no midline pulsatile mass, no organomegaly.  SKIN:  No rash, nodule.  EXTREMITIES:  2+ pulses throughout.  No edema, cyanosis, or clubbing.  NEUROLOGIC:  Oriented to person, place, and time.  Cranial nerves II-XII  grossly intact.  Motor grossly intact.   LABORATORY DATA:  EKG:  Sinus rhythm, rate 77, axis within normal limits,  intervals within normal limits, no acute ST wave changes.   WBC 5.7, hemoglobin 15.4. Sodium 140, potassium 3.4, BUN 16, creatinine 0.8.  CK 108, MB 1.7, INR 1.9.   Chest x-ray: Bibasilar atelectasis.   ASSESSMENT AND PLAN:  1. Syncope.  The etiology of this is not clear.  She did have a headache     preceding this with some chest pain.  She has had a negative head CT.     She does have a history of palpitations but is currently in normal sinus     rhythm.  There are no symptoms consistent with orthostasis.  She has had     no focal motor findings.  At this point, we will observe for recurrent     symptoms of syncope or presyncope.  I would like to watch her on     telemetry overnight.  2. Chest discomfort.  She will be ruled out for myocardial infarction.  If     her enzymes are normal, will most likely not pursue further cardiac     workup given her cardiac catheterization which was normal two years ago,  the atypical nature of her symptoms, and the lack of objective evidence    of ischemia.  3. Headache.  Again, this was worked  up with a head CT.  If this persists,     she will follow up with Dr. Lysbeth Galas or possibly be referred to a     neurologist.  It is a possibility that Effexor may be contributing to     this.  4. Anxiety.  The patient reports she thinks this is playing a large role.     She has trouble at home as described.  This will be followed by Dr.     Lysbeth Galas.                                                Rollene Rotunda, M.D.    JH/MEDQ  D:  06/13/2003  T:  06/13/2003  Job:  [id number]   cc:   Delaney Meigs, M.D.  [address]  [place]  [place] [address]  Fax: [phone]

## 2011-07-02 ENCOUNTER — Emergency Department (HOSPITAL_COMMUNITY)
Admission: EM | Admit: 2011-07-02 | Discharge: 2011-07-02 | Disposition: A | Discharge status: Home / Self Care | Payer: Self-pay | Attending: Emergency Medicine | Admitting: Emergency Medicine

## 2011-07-02 ENCOUNTER — Emergency Department (HOSPITAL_COMMUNITY): Payer: Self-pay

## 2011-07-02 DIAGNOSIS — I10 Essential (primary) hypertension: Secondary | ICD-10-CM | POA: Insufficient documentation

## 2011-07-02 DIAGNOSIS — E119 Type 2 diabetes mellitus without complications: Secondary | ICD-10-CM | POA: Insufficient documentation

## 2011-07-02 DIAGNOSIS — R05 Cough: Secondary | ICD-10-CM | POA: Insufficient documentation

## 2011-07-02 DIAGNOSIS — R059 Cough, unspecified: Secondary | ICD-10-CM | POA: Insufficient documentation

## 2011-07-02 DIAGNOSIS — R0602 Shortness of breath: Secondary | ICD-10-CM | POA: Insufficient documentation

## 2011-07-02 DIAGNOSIS — R071 Chest pain on breathing: Secondary | ICD-10-CM | POA: Insufficient documentation

## 2011-07-02 DIAGNOSIS — R079 Chest pain, unspecified: Secondary | ICD-10-CM | POA: Insufficient documentation

## 2011-07-02 LAB — BASIC METABOLIC PANEL
BUN: 10 mg/dL (ref 6–23)
Potassium: 4 mEq/L (ref 3.5–5.1)
Sodium: 139 mEq/L (ref 135–145)

## 2011-07-02 LAB — URINALYSIS, ROUTINE W REFLEX MICROSCOPIC
Bilirubin Urine: NEGATIVE
Nitrite: NEGATIVE
Specific Gravity, Urine: 1.006 (ref 1.005–1.030)
Urobilinogen, UA: 0.2 mg/dL (ref 0.0–1.0)

## 2011-07-02 LAB — DIFFERENTIAL
Basophils Absolute: 0 10*3/uL (ref 0.0–0.1)
Basophils Relative: 0 % (ref 0–1)
Eosinophils Relative: 1 % (ref 0–5)
Monocytes Absolute: 0.5 10*3/uL (ref 0.1–1.0)

## 2011-07-02 LAB — CBC
MCHC: 32.5 g/dL (ref 30.0–36.0)
RDW: 14.9 % (ref 11.5–15.5)

## 2011-07-02 MED ORDER — IOHEXOL 300 MG/ML  SOLN
100.0000 mL | Freq: Once | INTRAMUSCULAR | Status: AC | PRN
  Administered 2011-07-02: 80 mL via INTRAVENOUS

## 2011-11-29 ENCOUNTER — Encounter (HOSPITAL_COMMUNITY): Payer: Self-pay

## 2011-11-29 ENCOUNTER — Emergency Department (HOSPITAL_COMMUNITY): Payer: Self-pay

## 2011-11-29 ENCOUNTER — Emergency Department (HOSPITAL_COMMUNITY)
Admission: EM | Admit: 2011-11-29 | Discharge: 2011-11-29 | Disposition: A | Discharge status: Home Health | Payer: Self-pay | Attending: Emergency Medicine | Admitting: Emergency Medicine

## 2011-11-29 DIAGNOSIS — R509 Fever, unspecified: Secondary | ICD-10-CM | POA: Insufficient documentation

## 2011-11-29 DIAGNOSIS — E7800 Pure hypercholesterolemia, unspecified: Secondary | ICD-10-CM | POA: Insufficient documentation

## 2011-11-29 DIAGNOSIS — E119 Type 2 diabetes mellitus without complications: Secondary | ICD-10-CM | POA: Insufficient documentation

## 2011-11-29 DIAGNOSIS — R109 Unspecified abdominal pain: Secondary | ICD-10-CM | POA: Insufficient documentation

## 2011-11-29 DIAGNOSIS — J45909 Unspecified asthma, uncomplicated: Secondary | ICD-10-CM | POA: Insufficient documentation

## 2011-11-29 DIAGNOSIS — R1110 Vomiting, unspecified: Secondary | ICD-10-CM | POA: Insufficient documentation

## 2011-11-29 DIAGNOSIS — K859 Acute pancreatitis without necrosis or infection, unspecified: Secondary | ICD-10-CM | POA: Insufficient documentation

## 2011-11-29 DIAGNOSIS — I10 Essential (primary) hypertension: Secondary | ICD-10-CM | POA: Insufficient documentation

## 2011-11-29 DIAGNOSIS — IMO0001 Reserved for inherently not codable concepts without codable children: Secondary | ICD-10-CM | POA: Insufficient documentation

## 2011-11-29 HISTORY — DX: Essential (primary) hypertension: I10

## 2011-11-29 HISTORY — DX: Bronchitis, not specified as acute or chronic: J40

## 2011-11-29 HISTORY — DX: Pure hypercholesterolemia, unspecified: E78.00

## 2011-11-29 HISTORY — DX: Pneumonia, unspecified organism: J18.9

## 2011-11-29 LAB — COMPREHENSIVE METABOLIC PANEL
ALT: 33 U/L (ref 0–35)
AST: 39 U/L — ABNORMAL HIGH (ref 0–37)
Albumin: 3.7 g/dL (ref 3.5–5.2)
Alkaline Phosphatase: 45 U/L (ref 39–117)
Glucose, Bld: 156 mg/dL — ABNORMAL HIGH (ref 70–99)
Potassium: 3.5 mEq/L (ref 3.5–5.1)
Sodium: 140 mEq/L (ref 135–145)
Total Protein: 7.2 g/dL (ref 6.0–8.3)

## 2011-11-29 LAB — DIFFERENTIAL
Basophils Absolute: 0 10*3/uL (ref 0.0–0.1)
Basophils Relative: 0 % (ref 0–1)
Eosinophils Absolute: 0.2 10*3/uL (ref 0.0–0.7)
Lymphs Abs: 0.6 10*3/uL — ABNORMAL LOW (ref 0.7–4.0)
Neutrophils Relative %: 77 % (ref 43–77)

## 2011-11-29 LAB — URINALYSIS, ROUTINE W REFLEX MICROSCOPIC
Bilirubin Urine: NEGATIVE
Glucose, UA: NEGATIVE mg/dL
Hgb urine dipstick: NEGATIVE
Specific Gravity, Urine: <1.005 — ABNORMAL LOW (ref 1.005–1.030)
pH: 6.5 (ref 5.0–8.0)

## 2011-11-29 LAB — CBC
MCH: 28.1 pg (ref 26.0–34.0)
Platelets: 133 10*3/uL — ABNORMAL LOW (ref 150–400)
RBC: 4.81 MIL/uL (ref 3.87–5.11)
RDW: 13.9 % (ref 11.5–15.5)

## 2011-11-29 MED ORDER — METOCLOPRAMIDE HCL 5 MG/ML IJ SOLN
10.0000 mg | Freq: Once | INTRAMUSCULAR | Status: AC
  Administered 2011-11-29: 10 mg via INTRAVENOUS
  Filled 2011-11-29: qty 2

## 2011-11-29 MED ORDER — METOCLOPRAMIDE HCL 10 MG PO TABS: 10.0000 mg | ORAL_TABLET | Freq: Four times a day (QID) | ORAL | Status: DC

## 2011-11-29 MED ORDER — ONDANSETRON HCL 4 MG/2ML IJ SOLN
4.0000 mg | Freq: Once | INTRAMUSCULAR | Status: AC
  Administered 2011-11-29: 4 mg via INTRAVENOUS
  Filled 2011-11-29: qty 2

## 2011-11-29 MED ORDER — SODIUM CHLORIDE 0.9 % IV BOLUS (SEPSIS)
1000.0000 mL | Freq: Once | INTRAVENOUS | Status: AC
  Administered 2011-11-29: 1000 mL via INTRAVENOUS

## 2011-11-29 MED ORDER — HYOSCYAMINE SULFATE 0.125 MG PO TABS
0.2500 mg | ORAL_TABLET | Freq: Once | ORAL | Status: AC
  Administered 2011-11-29: 0.25 mg via ORAL
  Filled 2011-11-29: qty 2

## 2011-11-29 MED ORDER — OXYCODONE-ACETAMINOPHEN 5-325 MG PO TABS: 1.0000 | ORAL_TABLET | ORAL | Status: DC | PRN

## 2011-11-29 MED ORDER — IOHEXOL 300 MG/ML  SOLN
100.0000 mL | Freq: Once | INTRAMUSCULAR | Status: AC | PRN
  Administered 2011-11-29: 100 mL via INTRAVENOUS

## 2011-11-29 MED ORDER — IOHEXOL 300 MG/ML  SOLN
40.0000 mL | Freq: Once | INTRAMUSCULAR | Status: AC | PRN
  Administered 2011-11-29: 40 mL via ORAL

## 2011-11-29 MED ORDER — MORPHINE SULFATE 4 MG/ML IJ SOLN
4.0000 mg | Freq: Once | INTRAMUSCULAR | Status: AC
  Administered 2011-11-29: 4 mg via INTRAVENOUS
  Filled 2011-11-29: qty 1

## 2011-11-29 NOTE — ED Notes (Signed)
Pt reports Friday started with generalized body aches, vomiting, and low grade fever.  Last night fever was 101.  Has been taking tylenol and ibuprofen prn.  Denies diarrhea, lbm was yesterday.

## 2011-11-29 NOTE — ED Provider Notes (Signed)
History     CSN: [id number]  Arrival date & time 11/29/11  4098   First MD Initiated Contact with Patient 11/29/11 [id number]      Chief Complaint  Patient presents with  . Emesis    (Consider location/radiation/quality/duration/timing/severity/associated sxs/prior treatment) Patient is a 56 y.o. female presenting with vomiting. The history is provided by the patient.  Emesis  This is a new problem. The current episode started 2 days ago. The problem has not changed since onset.The emesis has an appearance of stomach contents and bilious material. The maximum temperature recorded prior to her arrival was 101 to 101.9 F. The fever has been present for less than 1 day. Associated symptoms include abdominal pain, chills, a fever and myalgias. Pertinent negatives include no arthralgias, no cough, no diarrhea, no headaches and no URI. Associated symptoms comments: She reports 10-12 well-formed stools daily which is her norm, unchanged with last BM yesterday.  Abdominal pain is cramping, partially improved after emesis.  She has taken no medicines for this symptom.  She has taken Tylenol and Motrin for temporary fever reduction..    Past Medical History  Diagnosis Date  . Hypertension   . Diabetes mellitus   . Hypercholesterolemia   . Asthma   . Bronchitis   . Pneumonia     Past Surgical History  Procedure Date  . Abdominal hysterectomy   . Cholecystectomy   . Tonsillectomy and adenoidectomy     No family history on file.  History  Substance Use Topics  . Smoking status: Never Smoker   . Smokeless tobacco: Not on file  . Alcohol Use: No    OB History    Grav Para Term Preterm Abortions TAB SAB Ect Mult Living                  Review of Systems  Constitutional: Positive for fever and chills.  HENT: Negative for congestion, sore throat and neck pain.   Eyes: Negative.   Respiratory: Negative for cough, chest tightness and shortness of breath.   Cardiovascular: Negative for  chest pain.  Gastrointestinal: Positive for vomiting and abdominal pain. Negative for nausea and diarrhea.  Genitourinary: Negative.   Musculoskeletal: Positive for myalgias. Negative for joint swelling and arthralgias.  Skin: Negative.  Negative for rash and wound.  Neurological: Negative for dizziness, weakness, light-headedness, numbness and headaches.  Hematological: Negative.   Psychiatric/Behavioral: Negative.     Allergies  Penicillins  Home Medications  No current outpatient prescriptions on file.  BP 150/86  Pulse 107  Temp(Src) 98.5 F (36.9 C) (Oral)  Resp 20  Ht 5\' 2"  (1.575 m)  Wt 198 lb (89.812 kg)  BMI 36.21 kg/m2  SpO2 95%  Physical Exam  Nursing note and vitals reviewed. Constitutional: She is oriented to person, place, and time. She appears well-developed and well-nourished.  HENT:  Head: Normocephalic and atraumatic.  Eyes: Conjunctivae are normal.  Neck: Normal range of motion.  Cardiovascular: Normal rate, regular rhythm, normal heart sounds and intact distal pulses.   Pulmonary/Chest: Effort normal and breath sounds normal. She has no wheezes.  Abdominal: Soft. Bowel sounds are normal. She exhibits no mass. There is tenderness. There is no rebound and no guarding.       Adipose abdomen, generalized tenderness with no localized pain guarding or rebound.  Bowel sounds are normal active, no increased tympany to percussion  Musculoskeletal: Normal range of motion.  Neurological: She is alert and oriented to person, place, and time.  Skin:  Skin is warm and dry.  Psychiatric: She has a normal mood and affect.    ED Course  Procedures (including critical care time)  Labs Reviewed - No data to display No results found.   No diagnosis found.  Normal saline 1 L given per IV, Zofran 4 mg IV, Levsin 0.25 mg by mouth.  Still with moderate pain and nausea.  Given morphine 3 mg IV,  reglan 10 mg IV - pain improved,  Nausea resolved.  Ct scan results  negative for acute findings.  Labs unremarkable except for elevation of lipase level.  Pt sp cholecystectomy so doubt gallstone,  Esp with normal lft's.  Suspect purely pancreatitis.  Pt did tolerate po fluids in ed.  Plan for home trial with percocet and reglan prn.  Clear liquid diet.  Recheck by pcp in 1-2 days,  Returning here sooner if sx worsen (pain,  Vomiting,  Weakness)j.  MDM          Candis Musa, PA 11/29/11 2125

## 2011-11-30 NOTE — ED Provider Notes (Signed)
Medical screening examination/treatment/procedure(s) were performed by non-physician practitioner and as supervising physician I was immediately available for consultation/collaboration.  Geoffery Lyons, MD 11/30/11 [phone]

## 2011-12-09 ENCOUNTER — Inpatient Hospital Stay (HOSPITAL_COMMUNITY)
Admission: EM | Admit: 2011-12-09 | Discharge: 2011-12-13 | DRG: 391 | Disposition: A | Discharge status: Home / Self Care | Payer: Self-pay | Attending: Internal Medicine | Admitting: Internal Medicine

## 2011-12-09 ENCOUNTER — Encounter (HOSPITAL_COMMUNITY): Payer: Self-pay | Admitting: Emergency Medicine

## 2011-12-09 ENCOUNTER — Emergency Department (HOSPITAL_COMMUNITY): Payer: Self-pay

## 2011-12-09 DIAGNOSIS — W208XXA Other cause of strike by thrown, projected or falling object, initial encounter: Secondary | ICD-10-CM | POA: Diagnosis not present

## 2011-12-09 DIAGNOSIS — I10 Essential (primary) hypertension: Secondary | ICD-10-CM | POA: Diagnosis present

## 2011-12-09 DIAGNOSIS — K2970 Gastritis, unspecified, without bleeding: Secondary | ICD-10-CM | POA: Diagnosis present

## 2011-12-09 DIAGNOSIS — S92919A Unspecified fracture of unspecified toe(s), initial encounter for closed fracture: Secondary | ICD-10-CM | POA: Diagnosis not present

## 2011-12-09 DIAGNOSIS — E119 Type 2 diabetes mellitus without complications: Secondary | ICD-10-CM | POA: Diagnosis present

## 2011-12-09 DIAGNOSIS — Z880 Allergy status to penicillin: Secondary | ICD-10-CM

## 2011-12-09 DIAGNOSIS — F3289 Other specified depressive episodes: Secondary | ICD-10-CM | POA: Diagnosis present

## 2011-12-09 DIAGNOSIS — Z87891 Personal history of nicotine dependence: Secondary | ICD-10-CM

## 2011-12-09 DIAGNOSIS — E7800 Pure hypercholesterolemia, unspecified: Secondary | ICD-10-CM | POA: Diagnosis present

## 2011-12-09 DIAGNOSIS — D126 Benign neoplasm of colon, unspecified: Secondary | ICD-10-CM | POA: Diagnosis present

## 2011-12-09 DIAGNOSIS — K859 Acute pancreatitis without necrosis or infection, unspecified: Secondary | ICD-10-CM | POA: Diagnosis present

## 2011-12-09 DIAGNOSIS — F29 Unspecified psychosis not due to a substance or known physiological condition: Secondary | ICD-10-CM | POA: Diagnosis not present

## 2011-12-09 DIAGNOSIS — Z882 Allergy status to sulfonamides status: Secondary | ICD-10-CM

## 2011-12-09 DIAGNOSIS — B009 Herpesviral infection, unspecified: Secondary | ICD-10-CM | POA: Diagnosis present

## 2011-12-09 DIAGNOSIS — E785 Hyperlipidemia, unspecified: Secondary | ICD-10-CM | POA: Diagnosis present

## 2011-12-09 DIAGNOSIS — F32A Depression, unspecified: Secondary | ICD-10-CM | POA: Diagnosis present

## 2011-12-09 DIAGNOSIS — F329 Major depressive disorder, single episode, unspecified: Secondary | ICD-10-CM | POA: Diagnosis present

## 2011-12-09 DIAGNOSIS — Z87892 Personal history of anaphylaxis: Secondary | ICD-10-CM

## 2011-12-09 DIAGNOSIS — R112 Nausea with vomiting, unspecified: Principal | ICD-10-CM | POA: Diagnosis present

## 2011-12-09 DIAGNOSIS — Z885 Allergy status to narcotic agent status: Secondary | ICD-10-CM

## 2011-12-09 DIAGNOSIS — Z79899 Other long term (current) drug therapy: Secondary | ICD-10-CM

## 2011-12-09 DIAGNOSIS — Z7982 Long term (current) use of aspirin: Secondary | ICD-10-CM

## 2011-12-09 DIAGNOSIS — R197 Diarrhea, unspecified: Secondary | ICD-10-CM | POA: Diagnosis present

## 2011-12-09 DIAGNOSIS — J45909 Unspecified asthma, uncomplicated: Secondary | ICD-10-CM | POA: Diagnosis present

## 2011-12-09 HISTORY — DX: Acute pancreatitis without necrosis or infection, unspecified: K85.90

## 2011-12-09 LAB — URINALYSIS, ROUTINE W REFLEX MICROSCOPIC
Leukocytes, UA: NEGATIVE
Nitrite: NEGATIVE
Specific Gravity, Urine: 1.005 (ref 1.005–1.030)
Urobilinogen, UA: 0.2 mg/dL (ref 0.0–1.0)
pH: 7 (ref 5.0–8.0)

## 2011-12-09 LAB — COMPREHENSIVE METABOLIC PANEL
ALT: 24 U/L (ref 0–35)
Albumin: 4.1 g/dL (ref 3.5–5.2)
Alkaline Phosphatase: 41 U/L (ref 39–117)
BUN: 8 mg/dL (ref 6–23)
Chloride: 102 mEq/L (ref 96–112)
Glucose, Bld: 95 mg/dL (ref 70–99)
Potassium: 3.1 mEq/L — ABNORMAL LOW (ref 3.5–5.1)
Sodium: 141 mEq/L (ref 135–145)
Total Bilirubin: 0.4 mg/dL (ref 0.3–1.2)
Total Protein: 7.4 g/dL (ref 6.0–8.3)

## 2011-12-09 LAB — CBC
Hemoglobin: 13 g/dL (ref 12.0–15.0)
MCH: 28.3 pg (ref 26.0–34.0)
Platelets: 188 10*3/uL (ref 150–400)
RBC: 4.59 MIL/uL (ref 3.87–5.11)
WBC: 6.5 10*3/uL (ref 4.0–10.5)

## 2011-12-09 LAB — DIFFERENTIAL
Basophils Relative: 1 % (ref 0–1)
Eosinophils Absolute: 0.1 10*3/uL (ref 0.0–0.7)
Lymphs Abs: 2.5 10*3/uL (ref 0.7–4.0)
Monocytes Relative: 5 % (ref 3–12)
Neutro Abs: 3.5 10*3/uL (ref 1.7–7.7)
Neutrophils Relative %: 53 % (ref 43–77)

## 2011-12-09 MED ORDER — SODIUM CHLORIDE 0.9 % IV SOLN
INTRAVENOUS | Status: DC
  Administered 2011-12-09 – 2011-12-10 (×2): via INTRAVENOUS

## 2011-12-09 MED ORDER — ONDANSETRON HCL 4 MG/2ML IJ SOLN
4.0000 mg | Freq: Once | INTRAMUSCULAR | Status: AC
  Administered 2011-12-09: 4 mg via INTRAVENOUS
  Filled 2011-12-09: qty 2

## 2011-12-09 MED ORDER — POTASSIUM CHLORIDE 10 MEQ/100ML IV SOLN
10.0000 meq | Freq: Once | INTRAVENOUS | Status: AC
  Administered 2011-12-09: 10 meq via INTRAVENOUS
  Filled 2011-12-09: qty 100

## 2011-12-09 MED ORDER — IOHEXOL 300 MG/ML  SOLN
80.0000 mL | Freq: Once | INTRAMUSCULAR | Status: AC | PRN
  Administered 2011-12-09: 80 mL via INTRAVENOUS

## 2011-12-09 MED ORDER — HYDROMORPHONE HCL PF 1 MG/ML IJ SOLN
1.0000 mg | Freq: Once | INTRAMUSCULAR | Status: AC
  Administered 2011-12-09: 1 mg via INTRAVENOUS
  Filled 2011-12-09: qty 1

## 2011-12-09 MED ORDER — IOHEXOL 300 MG/ML  SOLN
20.0000 mL | INTRAMUSCULAR | Status: DC
  Administered 2011-12-09: 20 mL via ORAL

## 2011-12-09 MED ORDER — SODIUM CHLORIDE 0.9 % IV BOLUS (SEPSIS)
1000.0000 mL | Freq: Once | INTRAVENOUS | Status: AC
  Administered 2011-12-09: 1000 mL via INTRAVENOUS

## 2011-12-09 NOTE — ED Notes (Signed)
Pt denies needs or complaints at this time. No acute distress is noted. Resp are unlabored. SKin is warm and dry. Dtr remains at bedside. VSS. SR up. Call bell in reach.

## 2011-12-09 NOTE — ED Notes (Signed)
CT notified that pt has finished oral contrast

## 2011-12-09 NOTE — ED Notes (Signed)
PO contrast provided at this time.  Pt noted in no acute distress.

## 2011-12-09 NOTE — ED Notes (Signed)
Patient transported to CT 

## 2011-12-09 NOTE — ED Provider Notes (Signed)
Pt reports recent vomiting/diarrhea Reports blood tinged stool today Told by [facility] to come into the ED She had recent diagnosis of pancreatitis, had negative CT scan She was told to call [facility] while in the ED   Joya Gaskins, MD 12/09/11 [phone]

## 2011-12-09 NOTE — ED Provider Notes (Signed)
History     CSN: [id number]  Arrival date & time 12/09/11  1729   First MD Initiated Contact with Patient 12/09/11 1847      Chief Complaint  Patient presents with  . Abdominal Pain  . Emesis    HPI Pt was seen at 1950.  Per pt, c/o gradual onset and persistence of multiple intermittent episodes of N/V/D for the past 3 to 4 weeks.  Has been assoc with generalized abd "pain," described as "cramping" and "aching."  Also has been assoc with intermittent "black" stools.  Pt was eval in ED several weeks ago for same, dx pancreatitis.  Pt has been eval by GI MD for same, was told her "stool tests were negative" and to come to the ED for further eval and admission.  Denies fevers, no back pain, no blood in emesis or stools, no rash, no CP/SOB, no cough.     [facility] Past Medical History  Diagnosis Date  . Hypertension   . Diabetes mellitus   . Hypercholesterolemia   . Asthma   . Bronchitis   . Pneumonia   . Pancreatitis     Past Surgical History  Procedure Date  . Abdominal hysterectomy   . Cholecystectomy   . Tonsillectomy and adenoidectomy     History  Substance Use Topics  . Smoking status: Never Smoker   . Smokeless tobacco: Not on file  . Alcohol Use: No   Review of Systems ROS: Statement: All systems negative except as marked or noted in the HPI; Constitutional: Negative for fever and chills. ; ; Eyes: Negative for eye pain, redness and discharge. ; ; ENMT: Negative for ear pain, hoarseness, nasal congestion, sinus pressure and sore throat. ; ; Cardiovascular: Negative for chest pain, palpitations, diaphoresis, dyspnea and peripheral edema. ; ; Respiratory: Negative for cough, wheezing and stridor. ; ; Gastrointestinal: +N/V/D, abd pain. Negative for hematemesis, jaundice and rectal bleeding. . ; ; Genitourinary: Negative for dysuria, flank pain and hematuria. ; ; Musculoskeletal: Negative for back pain and neck pain. Negative for swelling and trauma.; ; Skin: Negative for  pruritus, rash, abrasions, blisters, bruising and skin lesion.; ; Neuro: Negative for headache, lightheadedness and neck stiffness. Negative for weakness, altered level of consciousness , altered mental status, extremity weakness, paresthesias, involuntary movement, seizure and syncope.     Allergies  Penicillins and Codeine  Home Medications   Current Outpatient Rx  Name Route Sig Dispense Refill  . AMLODIPINE BESYLATE 5 MG PO TABS Oral Take 5 mg by mouth every evening.    . ASPIRIN EC 81 MG PO TBEC Oral Take 81 mg by mouth daily.    Marland Kitchen VITAMIN D 2000 UNITS PO TABS Oral Take 2,000 Units by mouth daily.    . CHOLINE FENOFIBRATE 135 MG PO CPDR Oral Take 135 mg by mouth at bedtime.    . DESVENLAFAXINE SUCCINATE ER 50 MG PO TB24 Oral Take 50 mg by mouth daily.    Marland Kitchen EZETIMIBE-SIMVASTATIN 10-40 MG PO TABS Oral Take 1 tablet by mouth at bedtime.    Marland Kitchen FLUCONAZOLE 100 MG PO TABS Oral Take 100 mg by mouth every other day.    Marland Kitchen GLIPIZIDE ER 5 MG PO TB24 Oral Take 5 mg by mouth at bedtime.    Marland Kitchen LORAZEPAM 0.5 MG PO TABS Oral Take 0.5 mg by mouth 3 (three) times daily as needed. FOR ANXIETY    . LYSINE 500 MG PO CAPS Oral Take 1 capsule by mouth every morning.    Marland Kitchen  METFORMIN HCL 500 MG PO TABS Oral Take 1,000 mg by mouth 2 (two) times daily with a meal.    . ADULT MULTIVITAMIN W/MINERALS CH Oral Take 1 tablet by mouth daily.    Marland Kitchen OMEPRAZOLE 40 MG PO CPDR Oral Take 40 mg by mouth daily.    Marland Kitchen CREON PO Oral Take 1 capsule by mouth daily.    Marland Kitchen POTASSIUM CHLORIDE ER 10 MEQ PO TBCR Oral Take 10 mEq by mouth every morning.    Marland Kitchen SITAGLIPTIN PHOSPHATE 100 MG PO TABS Oral Take 100 mg by mouth daily.    Marland Kitchen VALSARTAN-HYDROCHLOROTHIAZIDE 160-12.5 MG PO TABS Oral Take 1 tablet by mouth daily.    Marland Kitchen VITAMIN C 500 MG PO TABS Oral Take 500 mg by mouth daily.      BP 142/74  Pulse 77  Temp(Src) 98.1 F (36.7 C) (Oral)  Resp 15  SpO2 93%  Physical Exam 1953: Physical examination:  Nursing notes reviewed; Vital  signs and O2 SAT reviewed;  Constitutional: Well developed, Well nourished, Well hydrated, In no acute distress; Head:  Normocephalic, atraumatic; Eyes: EOMI, PERRL, No scleral icterus; ENMT: Mouth and pharynx normal, Mucous membranes moist; Neck: Supple, Full range of motion, No lymphadenopathy; Cardiovascular: Regular rate and rhythm, No murmur, rub, or gallop; Respiratory: Breath sounds clear & equal bilaterally, No rales, rhonchi, wheezes, or rub, Normal respiratory effort/excursion; Chest: Nontender, Movement normal; Abdomen: Soft, +diffusely tender to palp, no rebound or guarding, Nondistended, Normal bowel sounds; Genitourinary: No CVA tenderness; Extremities: Pulses normal, No tenderness, No edema, No calf edema or asymmetry.; Neuro: AA&Ox3, Major CN grossly intact.  No gross focal motor or sensory deficits in extremities.; Skin: Color normal, Warm, Dry, no rash.    ED Course  Procedures  0010:  Pt states she continues to feel nauseated and have abd pain despite multiple doses of IV meds.  Refuses IV reglan, states it "makes me have more diarrhea."  No diarrhea after 7+ hours in the ED, no vomiting.  States she was sent to the ED for an admission, per her [facility] office.  T/C to GI Dr. Matthias Hughs, case discussed, including:  HPI, pertinent PM/SHx, VS/PE, dx testing, ED course and treatment:  States pt did call the office today c/o multiple multiple episodes of N/V/D, ongoing for weeks/worse since yesterday, stool samples from ofc neg for cdiff; requests to admit to observation status under medicine service, needs strict I&O's, GI MD will consult in the morning.    0045:  Dx testing d/w pt and family.  Questions answered.  Verb understanding, agreeable to observation admit.  T/C to [facility] Dr. Toniann Fail, case discussed, including:  HPI, pertinent PM/SHx, VS/PE, dx testing, ED course and treatment, as well as D/W GI MD above:  Agreeable to observation admit, requests to obtain medical bed to team  3.    MDM  MDM Reviewed: nursing note, vitals and previous chart Reviewed previous: labs Interpretation: CT scan and labs   Results for orders placed during the hospital encounter of 12/09/11  URINALYSIS, ROUTINE W REFLEX MICROSCOPIC      Component Value Range   Color, Urine YELLOW  YELLOW    APPearance CLEAR  CLEAR    Specific Gravity, Urine 1.005  1.005 - 1.030    pH 7.0  5.0 - 8.0    Glucose, UA NEGATIVE  NEGATIVE (mg/dL)   Hgb urine dipstick NEGATIVE  NEGATIVE    Bilirubin Urine NEGATIVE  NEGATIVE    Ketones, ur NEGATIVE  NEGATIVE (mg/dL)  Protein, ur NEGATIVE  NEGATIVE (mg/dL)   Urobilinogen, UA 0.2  0.0 - 1.0 (mg/dL)   Nitrite NEGATIVE  NEGATIVE    Leukocytes, UA NEGATIVE  NEGATIVE   CBC      Component Value Range   WBC 6.5  4.0 - 10.5 (K/uL)   RBC 4.59  3.87 - 5.11 (MIL/uL)   Hemoglobin 13.0  12.0 - 15.0 (g/dL)   HCT 16.1  09.6 - 04.5 (%)   MCV 83.7  78.0 - 100.0 (fL)   MCH 28.3  26.0 - 34.0 (pg)   MCHC 33.9  30.0 - 36.0 (g/dL)   RDW 40.9  81.1 - 91.4 (%)   Platelets 188  150 - 400 (K/uL)  DIFFERENTIAL      Component Value Range   Neutrophils Relative 53  43 - 77 (%)   Neutro Abs 3.5  1.7 - 7.7 (K/uL)   Lymphocytes Relative 39  12 - 46 (%)   Lymphs Abs 2.5  0.7 - 4.0 (K/uL)   Monocytes Relative 5  3 - 12 (%)   Monocytes Absolute 0.3  0.1 - 1.0 (K/uL)   Eosinophils Relative 2  0 - 5 (%)   Eosinophils Absolute 0.1  0.0 - 0.7 (K/uL)   Basophils Relative 1  0 - 1 (%)   Basophils Absolute 0.1  0.0 - 0.1 (K/uL)  COMPREHENSIVE METABOLIC PANEL      Component Value Range   Sodium 141  135 - 145 (mEq/L)   Potassium 3.1 (*) 3.5 - 5.1 (mEq/L)   Chloride 102  96 - 112 (mEq/L)   CO2 25  19 - 32 (mEq/L)   Glucose, Bld 95  70 - 99 (mg/dL)   BUN 8  6 - 23 (mg/dL)   Creatinine, Ser 7.82  0.50 - 1.10 (mg/dL)   Calcium 9.8  8.4 - 95.6 (mg/dL)   Total Protein 7.4  6.0 - 8.3 (g/dL)   Albumin 4.1  3.5 - 5.2 (g/dL)   AST 29  0 - 37 (U/L)   ALT 24  0 - 35 (U/L)    Alkaline Phosphatase 41  39 - 117 (U/L)   Total Bilirubin 0.4  0.3 - 1.2 (mg/dL)   GFR calc non Af Amer >90  >90 (mL/min)   GFR calc Af Amer >90  >90 (mL/min)  LIPASE, BLOOD      Component Value Range   Lipase 58  11 - 59 (U/L)    Ct Abdomen Pelvis W Contrast 12/09/2011  *RADIOLOGY REPORT*  Clinical Data: Abdominal pain with nausea, vomiting, diarrhea.  CT ABDOMEN AND PELVIS WITH CONTRAST  Technique:  Multidetector CT imaging of the abdomen and pelvis was performed following the standard protocol during bolus administration of intravenous contrast.  Contrast: 80mL OMNIPAQUE IOHEXOL 300 MG/ML IJ SOLN  Comparison: 11/29/2011  Findings: The liver, spleen, pancreas, adrenal glands, and kidneys are normal.  Gallbladder is been removed.  No dilated bile ducts. Bowel appears normal including the terminal ileum and appendix. Uterus has been removed.  Ovaries are not visible.  Small periumbilical hernia containing only fat, stable.  No acute osseous abnormality.  IMPRESSION: No acute abnormality of the abdomen or pelvis.  No change since 11/29/2011.  Original Report Authenticated By: Gwynn Burly, M.D.          Laray Anger, DO 12/10/11 1601

## 2011-12-09 NOTE — ED Notes (Signed)
Patient states history of pancreatitis and has nausea vomiting and diarrhea with LUQ and LLQ abdominal pain.  Pain ranges from 6-10/10 achy cramping sharp. Abdomen soft distended. Airway intact bilateral equal chest rise and fall.

## 2011-12-09 NOTE — ED Notes (Signed)
Potassium complete. IV site noted benign.  MD to bedside to explain plan of care at length to patient and family member.  Pt assisted to and from bedside toilet.  Pt noted in no acute distress. Skin is warm and dry. Resp are unlabored. Will continue to monitor.

## 2011-12-09 NOTE — ED Notes (Signed)
Patient complaining of abdominal pain, nausea, vomiting, and diarrhea since yesterday; referred to ED from [facility] due to black-tinged diarrhea (patient was seen by them today) -- patient had stool sample done at [facility].  Patient has history of pancreatitis.

## 2011-12-10 ENCOUNTER — Encounter (HOSPITAL_COMMUNITY): Payer: Self-pay | Admitting: *Deleted

## 2011-12-10 DIAGNOSIS — R112 Nausea with vomiting, unspecified: Secondary | ICD-10-CM | POA: Diagnosis present

## 2011-12-10 DIAGNOSIS — I10 Essential (primary) hypertension: Secondary | ICD-10-CM | POA: Diagnosis present

## 2011-12-10 DIAGNOSIS — E119 Type 2 diabetes mellitus without complications: Secondary | ICD-10-CM | POA: Diagnosis present

## 2011-12-10 LAB — GLUCOSE, CAPILLARY
Glucose-Capillary: 87 mg/dL (ref 70–99)
Glucose-Capillary: 88 mg/dL (ref 70–99)
Glucose-Capillary: 79 mg/dL (ref 70–99)
Glucose-Capillary: 85 mg/dL (ref 70–99)

## 2011-12-10 LAB — COMPREHENSIVE METABOLIC PANEL
AST: 51 U/L — ABNORMAL HIGH (ref 0–37)
BUN: 5 mg/dL — ABNORMAL LOW (ref 6–23)
CO2: 29 mEq/L (ref 19–32)
Chloride: 106 mEq/L (ref 96–112)
Creatinine, Ser: 0.61 mg/dL (ref 0.50–1.10)
GFR calc Af Amer: >90 mL/min (ref >90)
GFR calc non Af Amer: >90 mL/min (ref >90)
Glucose, Bld: 95 mg/dL (ref 70–99)
Total Bilirubin: 0.4 mg/dL (ref 0.3–1.2)

## 2011-12-10 LAB — LIPASE, BLOOD: Lipase: 55 U/L (ref 11–59)

## 2011-12-10 LAB — CLOSTRIDIUM DIFFICILE BY PCR: Toxigenic C. Difficile by PCR: NEGATIVE

## 2011-12-10 LAB — CBC
MCH: 27.4 pg (ref 26.0–34.0)
Platelets: 164 10*3/uL (ref 150–400)
RBC: 4.63 MIL/uL (ref 3.87–5.11)

## 2011-12-10 MED ORDER — ACETAMINOPHEN 325 MG PO TABS
650.0000 mg | ORAL_TABLET | Freq: Once | ORAL | Status: AC
  Administered 2011-12-10: 650 mg via ORAL

## 2011-12-10 MED ORDER — LORAZEPAM 0.5 MG PO TABS
0.5000 mg | ORAL_TABLET | ORAL | Status: DC | PRN
  Administered 2011-12-11: 0.5 mg via ORAL
  Filled 2011-12-10: qty 1

## 2011-12-10 MED ORDER — ACETAMINOPHEN 500 MG PO TABS
1000.0000 mg | ORAL_TABLET | Freq: Once | ORAL | Status: DC
  Filled 2011-12-10: qty 2

## 2011-12-10 MED ORDER — METOCLOPRAMIDE HCL 5 MG/ML IJ SOLN
10.0000 mg | Freq: Once | INTRAMUSCULAR | Status: DC
  Filled 2011-12-10: qty 2

## 2011-12-10 MED ORDER — VITAMIN D 50 MCG (2000 UT) PO TABS: 2000.0000 [IU] | ORAL_TABLET | Freq: Every day | ORAL | Status: DC

## 2011-12-10 MED ORDER — FENTANYL CITRATE 0.05 MG/ML IJ SOLN
25.0000 ug | INTRAMUSCULAR | Status: DC | PRN
  Administered 2011-12-11: 25 ug via INTRAVENOUS
  Filled 2011-12-10: qty 2

## 2011-12-10 MED ORDER — ACYCLOVIR 5 % EX CREA
TOPICAL_CREAM | CUTANEOUS | Status: DC
  Administered 2011-12-10 – 2011-12-13 (×23): via TOPICAL
  Filled 2011-12-10: qty 5

## 2011-12-10 MED ORDER — LABETALOL HCL 5 MG/ML IV SOLN
10.0000 mg | INTRAVENOUS | Status: DC | PRN
  Filled 2011-12-10: qty 4

## 2011-12-10 MED ORDER — PANCRELIPASE (LIP-PROT-AMYL) 12000-38000 UNITS PO CPEP
1.0000 | ORAL_CAPSULE | Freq: Three times a day (TID) | ORAL | Status: DC
  Administered 2011-12-10 – 2011-12-13 (×8): 1 via ORAL
  Filled 2011-12-10 (×12): qty 1

## 2011-12-10 MED ORDER — ACETAMINOPHEN 325 MG PO TABS
ORAL_TABLET | ORAL | Status: AC
  Administered 2011-12-10: 650 mg via ORAL
  Filled 2011-12-10: qty 2

## 2011-12-10 MED ORDER — VITAMIN D3 25 MCG (1000 UNIT) PO TABS
2000.0000 [IU] | ORAL_TABLET | Freq: Every day | ORAL | Status: DC
  Administered 2011-12-10 – 2011-12-13 (×4): 2000 [IU] via ORAL
  Filled 2011-12-10 (×4): qty 2

## 2011-12-10 MED ORDER — BISACODYL 5 MG PO TBEC
10.0000 mg | DELAYED_RELEASE_TABLET | Freq: Once | ORAL | Status: AC
  Administered 2011-12-10: 10 mg via ORAL
  Filled 2011-12-10: qty 2

## 2011-12-10 MED ORDER — ACETAMINOPHEN 650 MG RE SUPP: 650.0000 mg | Freq: Four times a day (QID) | RECTAL | Status: DC | PRN

## 2011-12-10 MED ORDER — INSULIN ASPART 100 UNIT/ML ~~LOC~~ SOLN
0.0000 [IU] | SUBCUTANEOUS | Status: DC
  Filled 2011-12-10: qty 3

## 2011-12-10 MED ORDER — INSULIN ASPART 100 UNIT/ML ~~LOC~~ SOLN
0.0000 [IU] | Freq: Three times a day (TID) | SUBCUTANEOUS | Status: DC
  Filled 2011-12-10: qty 3

## 2011-12-10 MED ORDER — AMLODIPINE BESYLATE 5 MG PO TABS
5.0000 mg | ORAL_TABLET | Freq: Every evening | ORAL | Status: DC
  Administered 2011-12-10 – 2011-12-12 (×3): 5 mg via ORAL
  Filled 2011-12-10 (×4): qty 1

## 2011-12-10 MED ORDER — LINAGLIPTIN 5 MG PO TABS
5.0000 mg | ORAL_TABLET | Freq: Every day | ORAL | Status: DC
  Administered 2011-12-10 – 2011-12-13 (×4): 5 mg via ORAL
  Filled 2011-12-10 (×4): qty 1

## 2011-12-10 MED ORDER — ACETAMINOPHEN 325 MG PO TABS
650.0000 mg | ORAL_TABLET | Freq: Four times a day (QID) | ORAL | Status: DC | PRN
  Administered 2011-12-10 – 2011-12-12 (×3): 650 mg via ORAL
  Filled 2011-12-10 (×3): qty 2

## 2011-12-10 MED ORDER — ACETAMINOPHEN 325 MG PO TABS
ORAL_TABLET | ORAL | Status: AC
  Filled 2011-12-10: qty 3

## 2011-12-10 MED ORDER — ONDANSETRON HCL 4 MG PO TABS: 4.0000 mg | ORAL_TABLET | Freq: Four times a day (QID) | ORAL | Status: DC | PRN

## 2011-12-10 MED ORDER — VENLAFAXINE HCL ER 75 MG PO CP24
75.0000 mg | ORAL_CAPSULE | Freq: Every day | ORAL | Status: DC
  Administered 2011-12-11: 75 mg via ORAL
  Filled 2011-12-10 (×2): qty 1

## 2011-12-10 MED ORDER — VALSARTAN-HYDROCHLOROTHIAZIDE 160-12.5 MG PO TABS: 1.0000 | ORAL_TABLET | Freq: Every day | ORAL | Status: DC

## 2011-12-10 MED ORDER — IRBESARTAN 150 MG PO TABS
150.0000 mg | ORAL_TABLET | Freq: Every day | ORAL | Status: DC
  Administered 2011-12-10 – 2011-12-13 (×4): 150 mg via ORAL
  Filled 2011-12-10 (×4): qty 1

## 2011-12-10 MED ORDER — ONDANSETRON 4 MG PO TBDP
8.0000 mg | ORAL_TABLET | Freq: Once | ORAL | Status: AC
  Administered 2011-12-10: 8 mg via ORAL
  Filled 2011-12-10: qty 2

## 2011-12-10 MED ORDER — ONDANSETRON HCL 4 MG/2ML IJ SOLN: 4.0000 mg | Freq: Four times a day (QID) | INTRAMUSCULAR | Status: DC | PRN

## 2011-12-10 MED ORDER — HYDROCHLOROTHIAZIDE 12.5 MG PO CAPS
12.5000 mg | ORAL_CAPSULE | Freq: Every day | ORAL | Status: DC
  Administered 2011-12-10 – 2011-12-13 (×4): 12.5 mg via ORAL
  Filled 2011-12-10 (×4): qty 1

## 2011-12-10 MED ORDER — SODIUM CHLORIDE 0.9 % IV SOLN
INTRAVENOUS | Status: DC
  Administered 2011-12-10 – 2011-12-11 (×4): via INTRAVENOUS

## 2011-12-10 MED ORDER — ONDANSETRON HCL 4 MG/2ML IJ SOLN: 4.0000 mg | INTRAMUSCULAR | Status: DC | PRN

## 2011-12-10 MED ORDER — POLYETHYLENE GLYCOL 3350 17 GM/SCOOP PO POWD
1.0000 | Freq: Once | ORAL | Status: AC
  Administered 2011-12-10: 1 via ORAL
  Filled 2011-12-10: qty 255

## 2011-12-10 NOTE — ED Notes (Signed)
Pt refused tylenol and reglan.  MD at bedside to explain and discuss plan of care.  Pt noted in no acute distress without acute changes.

## 2011-12-10 NOTE — Progress Notes (Signed)
   CARE MANAGEMENT NOTE 12/10/2011  Patient:  Destiny Robinson, Destiny Robinson   Account Number:  [id number]  Date Initiated:  12/10/2011  Documentation initiated by:  Darlyne Russian  Subjective/Objective Assessment:   patient admitted with nausea, vomitting and diarrhea     Action/Plan:   patient admitted from home, she lives with her brother   Anticipated DC Date:  12/12/2011   Anticipated DC Plan:  HOME/SELF CARE      DC Planning Services  CM consult      Choice offered to / List presented to:             Status of service:  In process, will continue to follow Medicare Important Message given?   (If response is "NO", the following Medicare IM given date fields will be blank) Date Medicare IM given:   Date Additional Medicare IM given:    Discharge Disposition:    Per UR Regulation:    Comments:  PCP: Dr Rosealee Albee  12/10/2011 11:30 am Darlyne Russian RN, CCM  Met with patient, discussed discharge planning.  Patient reports she gets her meds from the health department and [facility]. She denies ay need for medication assistance at this time.  She does not have insurance and has applied in the past and not eligible.  CM to continue to follow for discharge needs.

## 2011-12-10 NOTE — Progress Notes (Signed)
Oriented patient to the room, call bell, and given "Welcome to [facility]" pamphlet and [employer]. Completed patient's health history.

## 2011-12-10 NOTE — H&P (Signed)
Destiny Robinson is an 56 y.o. female.  PCP - Dr.Nyland. Gastroenterologist - Deboraha Sprang.  Chief Complaint: Persistent nausea vomiting and diarrhea. HPI: 56 year-old female with history of hypertension, diabetes mellitus type 2, hyperlipidemia had originally come to the ER on February 24, couple of weeks ago because of abdominal pain with nausea vomiting and diarrhea. At that time labs showed a lipase of 200 with CAT scan abdomen and pelvis unremarkable. Patient subsequently went home with referral to Dr. Beverely Risen of [facility] whom patient saw last week. Patient has been having this persistent dull aching pain in the epigastric area with nausea vomiting and multiple episodes of diarrhea. As the symptoms persisted patient again came to the ER yesterday. Repeat CAT scan was done which does not show anything acute. ER physician Dr. Clarene Duke did speak to Dr. Matthias Hughs gastroenterologist on call for [place]. At this time Dr. Matthias Hughs as requested hospitalization and he will be seeing patient in consult. Patient denies any chest pain short of breath fever chills dizziness or loss of consciousness.  Past Medical History  Diagnosis Date  . Hypertension   . Diabetes mellitus   . Hypercholesterolemia   . Bronchitis   . Pneumonia   . Pancreatitis   . Asthma     "come and go"    Past Surgical History  Procedure Date  . Abdominal hysterectomy   . Cholecystectomy   . Tonsillectomy and adenoidectomy   . Cesarean section     X2     Family History  Problem Relation Age of Onset  . Colon cancer Sister    Social History:  reports that she quit smoking about 15 years ago. Her smoking use included Cigarettes. She quit after 6 years of use. She has never used smokeless tobacco. She reports that she does not drink alcohol or use illicit drugs.  Allergies:  Allergies  Allergen Reactions  . Penicillins Anaphylaxis  . Codeine Hypertension  . Sulfa Antibiotics Hives    Pt states "some of it, not all of it"  give her a reaction     Medications Prior to Admission  Medication Dose Route Frequency Provider Last Rate Last Dose  . 0.9 %  sodium chloride infusion   Intravenous Continuous Eduard Clos, MD      . acetaminophen (TYLENOL) tablet 650 mg  650 mg Oral Q6H PRN Eduard Clos, MD       Or  . acetaminophen (TYLENOL) suppository 650 mg  650 mg Rectal Q6H PRN Eduard Clos, MD      . acetaminophen (TYLENOL) tablet 650 mg  650 mg Oral Once Laray Anger, DO   650 mg at 12/10/11 0049  . fentaNYL (SUBLIMAZE) injection 25 mcg  25 mcg Intravenous Q30 min PRN Laray Anger, DO      . HYDROmorphone (DILAUDID) injection 1 mg  1 mg Intravenous Once Joya Gaskins, MD   1 mg at 12/09/11 1913  . insulin aspart (novoLOG) injection 0-9 Units  0-9 Units Subcutaneous TID WC Eduard Clos, MD      . iohexol (OMNIPAQUE) 300 MG/ML solution 80 mL  80 mL Intravenous Once PRN Medication Radiologist, MD   80 mL at 12/09/11 2339  . labetalol (NORMODYNE,TRANDATE) injection 10 mg  10 mg Intravenous Q2H PRN Eduard Clos, MD      . ondansetron [facility]) injection 4 mg  4 mg Intravenous Once Joya Gaskins, MD   4 mg at 12/09/11 1912  . ondansetron (ZOFRAN) tablet  4 mg  4 mg Oral Q6H PRN Eduard Clos, MD       Or  . ondansetron [facility]) injection 4 mg  4 mg Intravenous Q6H PRN Eduard Clos, MD      . ondansetron (ZOFRAN-ODT) disintegrating tablet 8 mg  8 mg Oral Once Laray Anger, DO   8 mg at 12/10/11 0040  . potassium chloride 10 mEq in 100 mL IVPB  10 mEq Intravenous Once Laray Anger, DO   10 mEq at 12/09/11 2015  . sodium chloride 0.9 % bolus 1,000 mL  1,000 mL Intravenous Once Joya Gaskins, MD   1,000 mL at 12/09/11 1911  . DISCONTD: 0.9 %  sodium chloride infusion   Intravenous Continuous Laray Anger, DO 100 mL/hr at 12/10/11 0258    . DISCONTD: acetaminophen (TYLENOL) 325 MG tablet           . DISCONTD: acetaminophen (TYLENOL)  tablet 1,000 mg  1,000 mg Oral Once Laray Anger, DO      . DISCONTD: iohexol (OMNIPAQUE) 300 MG/ML solution 20 mL  20 mL Oral Q1 Hr x 2 Medication Radiologist, MD   20 mL at 12/09/11 2218  . DISCONTD: metoCLOPramide (REGLAN) injection 10 mg  10 mg Intravenous Once Laray Anger, DO      . DISCONTD: ondansetron [facility]) injection 4 mg  4 mg Intravenous Q1H PRN Laray Anger, DO       Medications Prior to Admission  Medication Sig Dispense Refill  . amLODipine (NORVASC) 5 MG tablet Take 5 mg by mouth every evening.      Marland Kitchen aspirin EC 81 MG tablet Take 81 mg by mouth daily.      . Cholecalciferol (VITAMIN D) 2000 UNITS tablet Take 2,000 Units by mouth daily.      . Choline Fenofibrate (TRILIPIX) 135 MG capsule Take 135 mg by mouth at bedtime.      Marland Kitchen desvenlafaxine (PRISTIQ) 50 MG 24 hr tablet Take 50 mg by mouth daily.      Marland Kitchen ezetimibe-simvastatin (VYTORIN) 10-40 MG per tablet Take 1 tablet by mouth at bedtime.      . fluconazole (DIFLUCAN) 100 MG tablet Take 100 mg by mouth every other day.      Marland Kitchen glipiZIDE (GLUCOTROL XL) 5 MG 24 hr tablet Take 5 mg by mouth at bedtime.      Marland Kitchen LORazepam (ATIVAN) 0.5 MG tablet Take 0.5 mg by mouth 3 (three) times daily as needed. FOR ANXIETY      . Lysine 500 MG CAPS Take 1 capsule by mouth every morning.      . Multiple Vitamin (MULITIVITAMIN WITH MINERALS) TABS Take 1 tablet by mouth daily.      . potassium chloride (K-DUR) 10 MEQ tablet Take 10 mEq by mouth every morning.      . sitaGLIPtin (JANUVIA) 100 MG tablet Take 100 mg by mouth daily.      . valsartan-hydrochlorothiazide (DIOVAN-HCT) 160-12.5 MG per tablet Take 1 tablet by mouth daily.      . vitamin C (ASCORBIC ACID) 500 MG tablet Take 500 mg by mouth daily.        Results for orders placed during the hospital encounter of 12/09/11 (from the past 48 hour(s))  URINALYSIS, ROUTINE W REFLEX MICROSCOPIC     Status: Normal   Collection Time   12/09/11  6:02 PM      Component Value Range  Comment   Color, Urine YELLOW  YELLOW  APPearance CLEAR  CLEAR     Specific Gravity, Urine 1.005  1.005 - 1.030     pH 7.0  5.0 - 8.0     Glucose, UA NEGATIVE  NEGATIVE (mg/dL)    Hgb urine dipstick NEGATIVE  NEGATIVE     Bilirubin Urine NEGATIVE  NEGATIVE     Ketones, ur NEGATIVE  NEGATIVE (mg/dL)    Protein, ur NEGATIVE  NEGATIVE (mg/dL)    Urobilinogen, UA 0.2  0.0 - 1.0 (mg/dL)    Nitrite NEGATIVE  NEGATIVE     Leukocytes, UA NEGATIVE  NEGATIVE  MICROSCOPIC NOT DONE ON URINES WITH NEGATIVE PROTEIN, BLOOD, LEUKOCYTES, NITRITE, OR GLUCOSE <1000 mg/dL.  CBC     Status: Normal   Collection Time   12/09/11  6:56 PM      Component Value Range Comment   WBC 6.5  4.0 - 10.5 (K/uL)    RBC 4.59  3.87 - 5.11 (MIL/uL)    Hemoglobin 13.0  12.0 - 15.0 (g/dL)    HCT 19.1  47.8 - 29.5 (%)    MCV 83.7  78.0 - 100.0 (fL)    MCH 28.3  26.0 - 34.0 (pg)    MCHC 33.9  30.0 - 36.0 (g/dL)    RDW 62.1  30.8 - 65.7 (%)    Platelets 188  150 - 400 (K/uL)   DIFFERENTIAL     Status: Normal   Collection Time   12/09/11  6:56 PM      Component Value Range Comment   Neutrophils Relative 53  43 - 77 (%)    Neutro Abs 3.5  1.7 - 7.7 (K/uL)    Lymphocytes Relative 39  12 - 46 (%)    Lymphs Abs 2.5  0.7 - 4.0 (K/uL)    Monocytes Relative 5  3 - 12 (%)    Monocytes Absolute 0.3  0.1 - 1.0 (K/uL)    Eosinophils Relative 2  0 - 5 (%)    Eosinophils Absolute 0.1  0.0 - 0.7 (K/uL)    Basophils Relative 1  0 - 1 (%)    Basophils Absolute 0.1  0.0 - 0.1 (K/uL)   COMPREHENSIVE METABOLIC PANEL     Status: Abnormal   Collection Time   12/09/11  6:56 PM      Component Value Range Comment   Sodium 141  135 - 145 (mEq/L)    Potassium 3.1 (*) 3.5 - 5.1 (mEq/L)    Chloride 102  96 - 112 (mEq/L)    CO2 25  19 - 32 (mEq/L)    Glucose, Bld 95  70 - 99 (mg/dL)    BUN 8  6 - 23 (mg/dL)    Creatinine, Ser 8.46  0.50 - 1.10 (mg/dL)    Calcium 9.8  8.4 - 10.5 (mg/dL)    Total Protein 7.4  6.0 - 8.3 (g/dL)    Albumin 4.1   3.5 - 5.2 (g/dL)    AST 29  0 - 37 (U/L)    ALT 24  0 - 35 (U/L)    Alkaline Phosphatase 41  39 - 117 (U/L)    Total Bilirubin 0.4  0.3 - 1.2 (mg/dL)    GFR calc non Af Amer >90  >90 (mL/min)    GFR calc Af Amer >90  >90 (mL/min)   LIPASE, BLOOD     Status: Normal   Collection Time   12/09/11  6:56 PM      Component Value Range Comment   Lipase 58  11 - 59 (U/L)    Ct Abdomen Pelvis W Contrast  12/09/2011  *RADIOLOGY REPORT*  Clinical Data: Abdominal pain with nausea, vomiting, diarrhea.  CT ABDOMEN AND PELVIS WITH CONTRAST  Technique:  Multidetector CT imaging of the abdomen and pelvis was performed following the standard protocol during bolus administration of intravenous contrast.  Contrast: 80mL OMNIPAQUE IOHEXOL 300 MG/ML IJ SOLN  Comparison: 11/29/2011  Findings: The liver, spleen, pancreas, adrenal glands, and kidneys are normal.  Gallbladder is been removed.  No dilated bile ducts. Bowel appears normal including the terminal ileum and appendix. Uterus has been removed.  Ovaries are not visible.  Small periumbilical hernia containing only fat, stable.  No acute osseous abnormality.  IMPRESSION: No acute abnormality of the abdomen or pelvis.  No change since 11/29/2011.  Original Report Authenticated By: Gwynn Burly, M.D.    Review of Systems  Constitutional: Negative.   HENT: Negative.   Eyes: Negative.   Respiratory: Negative.   Cardiovascular: Negative.   Gastrointestinal: Positive for nausea, vomiting, abdominal pain and diarrhea.  Genitourinary: Negative.   Musculoskeletal: Negative.   Skin: Negative.   Neurological: Negative.   Endo/Heme/Allergies: Negative.   Psychiatric/Behavioral: Negative.     Blood pressure 108/65, pulse 71, temperature 98.2 F (36.8 C), temperature source Oral, resp. rate 17, height 5\' 2"  (1.575 m), weight 89.8 kg (197 lb 15.6 oz), SpO2 90.00%. Physical Exam  Constitutional: She is oriented to person, place, and time. She appears well-developed  and well-nourished. No distress.  HENT:  Head: Normocephalic and atraumatic.  Right Ear: External ear normal.  Left Ear: External ear normal.  Mouth/Throat: No oropharyngeal exudate.  Eyes: Conjunctivae are normal. Pupils are equal, round, and reactive to light. Right eye exhibits no discharge. Left eye exhibits no discharge. No scleral icterus.  Neck: Normal range of motion. Neck supple.  Cardiovascular: Normal rate, regular rhythm and normal heart sounds.   Respiratory: Effort normal and breath sounds normal. No respiratory distress. She has no wheezes. She has no rales.  GI: Soft. Bowel sounds are normal. She exhibits no distension. There is no tenderness. There is no rebound.  Musculoskeletal: Normal range of motion. She exhibits no edema and no tenderness.  Neurological: She is alert and oriented to person, place, and time.       Moves all extremities.  Skin: Skin is warm and dry. No rash noted. She is not diaphoretic. No erythema.  Psychiatric: Her behavior is normal.     Assessment/Plan #1. Nausea vomiting, abdominal pain and diarrhea - could be from viral but has been persistent for 2 weeks now. Patient did have elevated lipase 2 weeks ago and today's lipase levels were normal. Patient could also have possibility of pancreatitis. At this time I am going to keep patient on IV fluids and n.p.o. Get stool cultures. Wait for GI consult and recommendations. #2. Diabetes mellitus2 - as patient is n.p.o. patient will be on CBG checks with sliding scale coverage. #3. History of hypertension - patient is placed on when necessary IV labetalol for systolic blood pressure more than 160. #4. History of hyperlipidemia.  CODE STATUS - full code.  Jaquavious Mercer N. 12/10/2011, 4:17 AM

## 2011-12-10 NOTE — Consult Note (Signed)
[facility] Consult Note  Referring Provider: No ref. provider found Primary Care Physician:  Josue Hector, MD, MD Primary Gastroenterologist:  Dr.  Antony Contras Complaint: Nausea vomiting and diarrhea HPI: Destiny Robinson is an 56 y.o. white female.  Admitted with a two-week history of begin his abrupt onset of nausea vomiting diarrhea and diffuse abdominal pain which has persisted. She was seen in the emergency room on February 24 and apparently had an unrevealing workup including a CT scan. She was then seen by her primary care physician at some point had an elevated lipase and possibly liver function tests although I do not have the results. She was then referred to gastroenterology saw her and started her on pancreatic enzymes. She persisted in having nausea and diarrhea with variable degrees of abdominal pain since the onset of her symptoms but yesterday she began having increasing diarrhea with recurrent nausea and vomiting. She has not had any fever. She states she does have a family history of colon cancer in a sister and has not had a colonoscopy. She denies any recent travel previous antibiotics but does get her drinking water from a well. She had some stool studies returned to our office yesterday which were reportedly negative but I'm not sure what studies these were.  Past Medical History  Diagnosis Date  . Hypertension   . Diabetes mellitus   . Hypercholesterolemia   . Bronchitis   . Pneumonia   . Pancreatitis   . Asthma     "come and go"    Past Surgical History  Procedure Date  . Abdominal hysterectomy   . Cholecystectomy   . Tonsillectomy and adenoidectomy   . Cesarean section     X2     Medications Prior to Admission  Medication Dose Route Frequency Provider Last Rate Last Dose  . 0.9 %  sodium chloride infusion   Intravenous Continuous Eduard Clos, MD 100 mL/hr at 12/10/11 0444    . acetaminophen (TYLENOL) tablet 650 mg  650 mg Oral Q6H PRN  Eduard Clos, MD       Or  . acetaminophen (TYLENOL) suppository 650 mg  650 mg Rectal Q6H PRN Eduard Clos, MD      . acetaminophen (TYLENOL) tablet 650 mg  650 mg Oral Once Laray Anger, DO   650 mg at 12/10/11 0049  . fentaNYL (SUBLIMAZE) injection 25 mcg  25 mcg Intravenous Q30 min PRN Laray Anger, DO      . HYDROmorphone (DILAUDID) injection 1 mg  1 mg Intravenous Once Joya Gaskins, MD   1 mg at 12/09/11 1913  . insulin aspart (novoLOG) injection 0-9 Units  0-9 Units Subcutaneous TID WC Eduard Clos, MD      . iohexol (OMNIPAQUE) 300 MG/ML solution 80 mL  80 mL Intravenous Once PRN Medication Radiologist, MD   80 mL at 12/09/11 2339  . labetalol (NORMODYNE,TRANDATE) injection 10 mg  10 mg Intravenous Q2H PRN Eduard Clos, MD      . ondansetron [facility]) injection 4 mg  4 mg Intravenous Once Joya Gaskins, MD   4 mg at 12/09/11 1912  . ondansetron (ZOFRAN) tablet 4 mg  4 mg Oral Q6H PRN Eduard Clos, MD       Or  . ondansetron [facility]) injection 4 mg  4 mg Intravenous Q6H PRN Eduard Clos, MD      . ondansetron (ZOFRAN-ODT) disintegrating tablet 8 mg  8 mg Oral Once Magnus Sinning  McManus, DO   8 mg at 12/10/11 0040  . potassium chloride 10 mEq in 100 mL IVPB  10 mEq Intravenous Once Laray Anger, DO   10 mEq at 12/09/11 2015  . sodium chloride 0.9 % bolus 1,000 mL  1,000 mL Intravenous Once Joya Gaskins, MD   1,000 mL at 12/09/11 1911  . DISCONTD: 0.9 %  sodium chloride infusion   Intravenous Continuous Laray Anger, DO 100 mL/hr at 12/10/11 0258    . DISCONTD: acetaminophen (TYLENOL) 325 MG tablet           . DISCONTD: acetaminophen (TYLENOL) tablet 1,000 mg  1,000 mg Oral Once Laray Anger, DO      . DISCONTD: iohexol (OMNIPAQUE) 300 MG/ML solution 20 mL  20 mL Oral Q1 Hr x 2 Medication Radiologist, MD   20 mL at 12/09/11 2218  . DISCONTD: metoCLOPramide (REGLAN) injection 10 mg  10 mg Intravenous Once  Laray Anger, DO      . DISCONTD: ondansetron [place]) injection 4 mg  4 mg Intravenous Q1H PRN Laray Anger, DO       Medications Prior to Admission  Medication Sig Dispense Refill  . amLODipine (NORVASC) 5 MG tablet Take 5 mg by mouth every evening.      Marland Kitchen aspirin EC 81 MG tablet Take 81 mg by mouth daily.      . Cholecalciferol (VITAMIN D) 2000 UNITS tablet Take 2,000 Units by mouth daily.      . Choline Fenofibrate (TRILIPIX) 135 MG capsule Take 135 mg by mouth at bedtime.      Marland Kitchen desvenlafaxine (PRISTIQ) 50 MG 24 hr tablet Take 50 mg by mouth daily.      Marland Kitchen ezetimibe-simvastatin (VYTORIN) 10-40 MG per tablet Take 1 tablet by mouth at bedtime.      . fluconazole (DIFLUCAN) 100 MG tablet Take 100 mg by mouth every other day.      Marland Kitchen glipiZIDE (GLUCOTROL XL) 5 MG 24 hr tablet Take 5 mg by mouth at bedtime.      Marland Kitchen LORazepam (ATIVAN) 0.5 MG tablet Take 0.5 mg by mouth 3 (three) times daily as needed. FOR ANXIETY      . Lysine 500 MG CAPS Take 1 capsule by mouth every morning.      . Multiple Vitamin (MULITIVITAMIN WITH MINERALS) TABS Take 1 tablet by mouth daily.      . Pancrelipase, Lip-Prot-Amyl, (CREON PO) Take 1 capsule by mouth 3 (three) times daily with meals.      . potassium chloride (K-DUR) 10 MEQ tablet Take 10 mEq by mouth every morning.      . sitaGLIPtin (JANUVIA) 100 MG tablet Take 100 mg by mouth daily.      . valsartan-hydrochlorothiazide (DIOVAN-HCT) 160-12.5 MG per tablet Take 1 tablet by mouth daily.      . vitamin C (ASCORBIC ACID) 500 MG tablet Take 500 mg by mouth daily.        Allergies:  Allergies  Allergen Reactions  . Penicillins Anaphylaxis  . Codeine Hypertension  . Sulfa Antibiotics Hives    Pt states "some of it, not all of it" give her a reaction     Family History  Problem Relation Age of Onset  . Colon cancer Sister     Social History:  reports that she quit smoking about 15 years ago. Her smoking use included Cigarettes. She quit after 6  years of use. She has never used smokeless tobacco. She reports  that she does not drink alcohol or use illicit drugs.  Negative except for as above   Blood pressure 119/68, pulse 69, temperature 98.3 F (36.8 C), temperature source Oral, resp. rate 17, height 5\' 2"  (1.575 m), weight 89.8 kg (197 lb 15.6 oz), SpO2 92.00%. Head: Normocephalic, without obvious abnormality, atraumatic Neck: no adenopathy, no carotid bruit, no JVD, supple, symmetrical, trachea midline and thyroid not enlarged, symmetric, no tenderness/mass/nodules Resp: clear to auscultation bilaterally Cardio: regular rate and rhythm, S1, S2 normal, no murmur, click, rub or gallop GI: Abdomen soft moderately tender diffusely no rebound guarding or mass Extremities: extremities normal, atraumatic, no cyanosis or edema  Results for orders placed during the hospital encounter of 12/09/11 (from the past 48 hour(s))  URINALYSIS, ROUTINE W REFLEX MICROSCOPIC     Status: Normal   Collection Time   12/09/11  6:02 PM      Component Value Range Comment   Color, Urine YELLOW  YELLOW     APPearance CLEAR  CLEAR     Specific Gravity, Urine 1.005  1.005 - 1.030     pH 7.0  5.0 - 8.0     Glucose, UA NEGATIVE  NEGATIVE (mg/dL)    Hgb urine dipstick NEGATIVE  NEGATIVE     Bilirubin Urine NEGATIVE  NEGATIVE     Ketones, ur NEGATIVE  NEGATIVE (mg/dL)    Protein, ur NEGATIVE  NEGATIVE (mg/dL)    Urobilinogen, UA 0.2  0.0 - 1.0 (mg/dL)    Nitrite NEGATIVE  NEGATIVE     Leukocytes, UA NEGATIVE  NEGATIVE  MICROSCOPIC NOT DONE ON URINES WITH NEGATIVE PROTEIN, BLOOD, LEUKOCYTES, NITRITE, OR GLUCOSE <1000 mg/dL.  CBC     Status: Normal   Collection Time   12/09/11  6:56 PM      Component Value Range Comment   WBC 6.5  4.0 - 10.5 (K/uL)    RBC 4.59  3.87 - 5.11 (MIL/uL)    Hemoglobin 13.0  12.0 - 15.0 (g/dL)    HCT 16.1  09.6 - 04.5 (%)    MCV 83.7  78.0 - 100.0 (fL)    MCH 28.3  26.0 - 34.0 (pg)    MCHC 33.9  30.0 - 36.0 (g/dL)    RDW 40.9   81.1 - 91.4 (%)    Platelets 188  150 - 400 (K/uL)   DIFFERENTIAL     Status: Normal   Collection Time   12/09/11  6:56 PM      Component Value Range Comment   Neutrophils Relative 53  43 - 77 (%)    Neutro Abs 3.5  1.7 - 7.7 (K/uL)    Lymphocytes Relative 39  12 - 46 (%)    Lymphs Abs 2.5  0.7 - 4.0 (K/uL)    Monocytes Relative 5  3 - 12 (%)    Monocytes Absolute 0.3  0.1 - 1.0 (K/uL)    Eosinophils Relative 2  0 - 5 (%)    Eosinophils Absolute 0.1  0.0 - 0.7 (K/uL)    Basophils Relative 1  0 - 1 (%)    Basophils Absolute 0.1  0.0 - 0.1 (K/uL)   COMPREHENSIVE METABOLIC PANEL     Status: Abnormal   Collection Time   12/09/11  6:56 PM      Component Value Range Comment   Sodium 141  135 - 145 (mEq/L)    Potassium 3.1 (*) 3.5 - 5.1 (mEq/L)    Chloride 102  96 - 112 (mEq/L)  CO2 25  19 - 32 (mEq/L)    Glucose, Bld 95  70 - 99 (mg/dL)    BUN 8  6 - 23 (mg/dL)    Creatinine, Ser 9.14  0.50 - 1.10 (mg/dL)    Calcium 9.8  8.4 - 10.5 (mg/dL)    Total Protein 7.4  6.0 - 8.3 (g/dL)    Albumin 4.1  3.5 - 5.2 (g/dL)    AST 29  0 - 37 (U/L)    ALT 24  0 - 35 (U/L)    Alkaline Phosphatase 41  39 - 117 (U/L)    Total Bilirubin 0.4  0.3 - 1.2 (mg/dL)    GFR calc non Af Amer >90  >90 (mL/min)    GFR calc Af Amer >90  >90 (mL/min)   LIPASE, BLOOD     Status: Normal   Collection Time   12/09/11  6:56 PM      Component Value Range Comment   Lipase 58  11 - 59 (U/L)   GLUCOSE, CAPILLARY     Status: Normal   Collection Time   12/10/11  4:49 AM      Component Value Range Comment   Glucose-Capillary 87  70 - 99 (mg/dL)   COMPREHENSIVE METABOLIC PANEL     Status: Abnormal   Collection Time   12/10/11  6:36 AM      Component Value Range Comment   Sodium 145  135 - 145 (mEq/L)    Potassium 3.4 (*) 3.5 - 5.1 (mEq/L)    Chloride 106  96 - 112 (mEq/L)    CO2 29  19 - 32 (mEq/L)    Glucose, Bld 95  70 - 99 (mg/dL)    BUN 5 (*) 6 - 23 (mg/dL)    Creatinine, Ser 7.82  0.50 - 1.10 (mg/dL)    Calcium  9.1  8.4 - 10.5 (mg/dL)    Total Protein 7.2  6.0 - 8.3 (g/dL)    Albumin 3.8  3.5 - 5.2 (g/dL)    AST 51 (*) 0 - 37 (U/L)    ALT 34  0 - 35 (U/L)    Alkaline Phosphatase 55  39 - 117 (U/L)    Total Bilirubin 0.4  0.3 - 1.2 (mg/dL)    GFR calc non Af Amer >90  >90 (mL/min)    GFR calc Af Amer >90  >90 (mL/min)   CBC     Status: Normal   Collection Time   12/10/11  6:36 AM      Component Value Range Comment   WBC 5.2  4.0 - 10.5 (K/uL)    RBC 4.63  3.87 - 5.11 (MIL/uL)    Hemoglobin 12.7  12.0 - 15.0 (g/dL)    HCT 95.6  21.3 - 08.6 (%)    MCV 85.5  78.0 - 100.0 (fL)    MCH 27.4  26.0 - 34.0 (pg)    MCHC 32.1  30.0 - 36.0 (g/dL)    RDW 57.8  46.9 - 62.9 (%)    Platelets 164  150 - 400 (K/uL)   LIPASE, BLOOD     Status: Normal   Collection Time   12/10/11  6:36 AM      Component Value Range Comment   Lipase 55  11 - 59 (U/L)   LIPID PANEL     Status: Abnormal   Collection Time   12/10/11  6:36 AM      Component Value Range Comment   Cholesterol 139  0 -  200 (mg/dL)    Triglycerides 161 (*) <150 (mg/dL)    HDL 39 (*) >09 (mg/dL)    Total CHOL/HDL Ratio 3.6      VLDL 31  0 - 40 (mg/dL)    LDL Cholesterol 69  0 - 99 (mg/dL)   GLUCOSE, CAPILLARY     Status: Normal   Collection Time   12/10/11  7:56 AM      Component Value Range Comment   Glucose-Capillary 88  70 - 99 (mg/dL)    Comment 1 Notify RN      Ct Abdomen Pelvis W Contrast  12/09/2011  *RADIOLOGY REPORT*  Clinical Data: Abdominal pain with nausea, vomiting, diarrhea.  CT ABDOMEN AND PELVIS WITH CONTRAST  Technique:  Multidetector CT imaging of the abdomen and pelvis was performed following the standard protocol during bolus administration of intravenous contrast.  Contrast: 80mL OMNIPAQUE IOHEXOL 300 MG/ML IJ SOLN  Comparison: 11/29/2011  Findings: The liver, spleen, pancreas, adrenal glands, and kidneys are normal.  Gallbladder is been removed.  No dilated bile ducts. Bowel appears normal including the terminal ileum and  appendix. Uterus has been removed.  Ovaries are not visible.  Small periumbilical hernia containing only fat, stable.  No acute osseous abnormality.  IMPRESSION: No acute abnormality of the abdomen or pelvis.  No change since 11/29/2011.  Original Report Authenticated By: Gwynn Burly, M.D.    Assessment: Nausea vomiting diarrhea persisted over 2-3 weeks no clear etiology Family history of colon cancer in first-degree  Plan:  At this point will pursue both EGD in evaluation of her nausea vomiting and diarrhea and colonoscopy particularly given absence of clear etiology date in need for high risk colon cancer screening. Kalyb Pemble C 12/10/2011, 9:06 AM

## 2011-12-10 NOTE — Progress Notes (Signed)
   Subjective:   Slightly better        Physical Exam: Blood pressure 119/68, pulse 69, temperature 98.3 F (36.8 C), temperature source Oral, resp. rate 17, height 5\' 2"  (1.575 m), weight 89.8 kg (197 lb 15.6 oz), SpO2 92.00%. Alert and oriented x3 CVS: RRR RS: CTAB Abdomen : soft minimal epigastric tenderness   Investigations:  No results found for this or any previous visit (from the past 240 hour(s)).   Basic Metabolic Panel:  Basename 12/10/11 0636 12/09/11 1856  NA 145 141  K 3.4* 3.1*  CL 106 102  CO2 29 25  GLUCOSE 95 95  BUN 5* 8  CREATININE 0.61 0.55  CALCIUM 9.1 9.8  MG -- --  PHOS -- --   Liver Function Tests:  [facility] 12/10/11 0636 12/09/11 1856  AST 51* 29  ALT 34 24  ALKPHOS 55 41  BILITOT 0.4 0.4  PROT 7.2 7.4  ALBUMIN 3.8 4.1     CBC:  Basename 12/10/11 0636 12/09/11 1856  WBC 5.2 6.5  NEUTROABS -- 3.5  HGB 12.7 13.0  HCT 39.6 38.4  MCV 85.5 83.7  PLT 164 188    Ct Abdomen Pelvis W Contrast  12/09/2011  *RADIOLOGY REPORT*  Clinical Data: Abdominal pain with nausea, vomiting, diarrhea.  CT ABDOMEN AND PELVIS WITH CONTRAST  Technique:  Multidetector CT imaging of the abdomen and pelvis was performed following the standard protocol during bolus administration of intravenous contrast.  Contrast: 80mL OMNIPAQUE IOHEXOL 300 MG/ML IJ SOLN  Comparison: 11/29/2011  Findings: The liver, spleen, pancreas, adrenal glands, and kidneys are normal.  Gallbladder is been removed.  No dilated bile ducts. Bowel appears normal including the terminal ileum and appendix. Uterus has been removed.  Ovaries are not visible.  Small periumbilical hernia containing only fat, stable.  No acute osseous abnormality.  IMPRESSION: No acute abnormality of the abdomen or pelvis.  No change since 11/29/2011.  Original Report Authenticated By: Gwynn Burly, M.D.      Medications:  Scheduled:   . acetaminophen  650 mg Oral Once  .  HYDROmorphone (DILAUDID)  injection  1 mg Intravenous Once  . insulin aspart  0-9 Units Subcutaneous TID WC  . ondansetron  4 mg Intravenous Once  . ondansetron  8 mg Oral Once  . potassium chloride  10 mEq Intravenous Once  . sodium chloride  1,000 mL Intravenous Once  . DISCONTD: acetaminophen  1,000 mg Oral Once  . DISCONTD: iohexol  20 mL Oral Q1 Hr x 2  . DISCONTD: metoCLOPramide (REGLAN) injection  10 mg Intravenous Once   Continuous:   . sodium chloride 100 mL/hr at 12/10/11 0444  . DISCONTD: sodium chloride 100 mL/hr at 12/10/11 0454   UJW:[id number], acetaminophen, fentaNYL, iohexol, labetalol, ondansetron (ZOFRAN) IV, ondansetron, DISCONTD: ondansetron  Impression:  Principal Problem:  *Nausea, vomiting and diarrhea Active Problems:  Pancreatitis  HTN (hypertension)  Diabetes mellitus, type 2     Plan: Await workup per GI     LOS: 1 day   Judas Mohammad, MD Pager: [phone] 12/10/2011, 8:54 AM

## 2011-12-10 NOTE — ED Notes (Signed)
Pt transported to ready bed by tech at this time stable and in no acute distress.

## 2011-12-10 NOTE — Progress Notes (Signed)
Utilization review complete 

## 2011-12-11 ENCOUNTER — Encounter (HOSPITAL_COMMUNITY)
Admission: EM | Disposition: A | Discharge status: Home / Self Care | Payer: Self-pay | Source: Home / Self Care | Attending: Internal Medicine

## 2011-12-11 ENCOUNTER — Encounter (HOSPITAL_COMMUNITY): Payer: Self-pay | Admitting: *Deleted

## 2011-12-11 DIAGNOSIS — T8859XA Other complications of anesthesia, initial encounter: Secondary | ICD-10-CM

## 2011-12-11 DIAGNOSIS — Z86010 Personal history of colonic polyps: Secondary | ICD-10-CM

## 2011-12-11 DIAGNOSIS — Z9889 Other specified postprocedural states: Secondary | ICD-10-CM

## 2011-12-11 DIAGNOSIS — F329 Major depressive disorder, single episode, unspecified: Secondary | ICD-10-CM | POA: Diagnosis present

## 2011-12-11 HISTORY — PX: ESOPHAGOGASTRODUODENOSCOPY: SHX5428

## 2011-12-11 HISTORY — DX: Other specified postprocedural states: Z98.890

## 2011-12-11 HISTORY — PX: COLONOSCOPY: SHX5424

## 2011-12-11 HISTORY — DX: Personal history of colonic polyps: Z86.010

## 2011-12-11 HISTORY — DX: Other complications of anesthesia, initial encounter: T88.59XA

## 2011-12-11 LAB — BASIC METABOLIC PANEL
Calcium: 9.7 mg/dL (ref 8.4–10.5)
Chloride: 105 mEq/L (ref 96–112)
Creatinine, Ser: 0.48 mg/dL — ABNORMAL LOW (ref 0.50–1.10)
GFR calc Af Amer: >90 mL/min (ref >90)
GFR calc non Af Amer: >90 mL/min (ref >90)

## 2011-12-11 LAB — GLUCOSE, CAPILLARY
Glucose-Capillary: 79 mg/dL (ref 70–99)
Glucose-Capillary: 79 mg/dL (ref 70–99)
Glucose-Capillary: 87 mg/dL (ref 70–99)
Glucose-Capillary: 93 mg/dL (ref 70–99)
Glucose-Capillary: 96 mg/dL (ref 70–99)

## 2011-12-11 SURGERY — EGD (ESOPHAGOGASTRODUODENOSCOPY)
Anesthesia: Moderate Sedation

## 2011-12-11 MED ORDER — DIPHENHYDRAMINE HCL 50 MG/ML IJ SOLN
INTRAMUSCULAR | Status: AC
  Filled 2011-12-11: qty 1

## 2011-12-11 MED ORDER — SPOT INK MARKER SYRINGE KIT
PACK | SUBMUCOSAL | Status: DC | PRN
  Administered 2011-12-11: 2 mL via SUBMUCOSAL

## 2011-12-11 MED ORDER — GLIPIZIDE ER 5 MG PO TB24
5.0000 mg | ORAL_TABLET | Freq: Every day | ORAL | Status: DC
  Administered 2011-12-12 – 2011-12-13 (×2): 5 mg via ORAL
  Filled 2011-12-11 (×3): qty 1

## 2011-12-11 MED ORDER — MIDAZOLAM HCL 10 MG/2ML IJ SOLN
INTRAMUSCULAR | Status: DC | PRN
  Administered 2011-12-11 (×7): 2 mg via INTRAVENOUS

## 2011-12-11 MED ORDER — MIDAZOLAM HCL 10 MG/2ML IJ SOLN
INTRAMUSCULAR | Status: AC
  Filled 2011-12-11: qty 4

## 2011-12-11 MED ORDER — SPOT INK MARKER SYRINGE KIT
PACK | SUBMUCOSAL | Status: AC
  Filled 2011-12-11: qty 5

## 2011-12-11 MED ORDER — ASPIRIN EC 81 MG PO TBEC
81.0000 mg | DELAYED_RELEASE_TABLET | Freq: Every day | ORAL | Status: DC
  Administered 2011-12-12 – 2011-12-13 (×2): 81 mg via ORAL
  Filled 2011-12-11 (×3): qty 1

## 2011-12-11 MED ORDER — FENTANYL CITRATE 0.05 MG/ML IJ SOLN
INTRAMUSCULAR | Status: AC
  Filled 2011-12-11: qty 4

## 2011-12-11 MED ORDER — FENTANYL NICU IV SYRINGE 50 MCG/ML
INJECTION | INTRAMUSCULAR | Status: DC | PRN
  Administered 2011-12-11 (×6): 25 ug via INTRAVENOUS

## 2011-12-11 MED ORDER — VENLAFAXINE HCL ER 150 MG PO CP24
150.0000 mg | ORAL_CAPSULE | Freq: Every day | ORAL | Status: DC
  Administered 2011-12-11: 75 mg via ORAL
  Administered 2011-12-12 – 2011-12-13 (×2): 150 mg via ORAL
  Filled 2011-12-11 (×4): qty 1

## 2011-12-11 MED ORDER — BUTAMBEN-TETRACAINE-BENZOCAINE 2-2-14 % EX AERO
INHALATION_SPRAY | CUTANEOUS | Status: DC | PRN
  Administered 2011-12-11: 2 via TOPICAL

## 2011-12-11 NOTE — Progress Notes (Signed)
Dr. Madilyn Fireman in to speak with patient about findings, sister present. After Dr. Madilyn Fireman left, pt became irrate, threatening to leave hospital if her doctor did not tell her what he found. Unable to reason with patient. All findings repeated by RN and sister of patient. Patient insisting that Dr Madilyn Fireman had not talked with her.

## 2011-12-11 NOTE — Progress Notes (Signed)
Called to room for patient complaint, at 1215.  Patient is yelling and not oriented to what has happened.  Came back from endo very upset.  Allowed pt. To vent, Dr Lavera Guise has already been paged to see pt. Pt is reacting to sedation per Dr. Lavera Guise and allowed to vent but asked not to scream.  Meds given to pt by L.  Moss RN at this time as pt was NPO earlier.  Visitor in room will not verify that Dr. Madilyn Fireman spoke to pt post procedure but per his note he did.  Dr Madilyn Fireman called room after Dr. Lavera Guise spoke with him.  Liquids at bedside for pt., family member in room.  Pt has calmed down a little and no longer yelling at this time. Adline Mango RN CC

## 2011-12-11 NOTE — Op Note (Signed)
[facility] [address] [place], [place]  [address]  COLONOSCOPY PROCEDURE REPORT  PATIENT:  Destiny, Robinson  MR#:  [id number] BIRTHDATE:  Jun 21, 1956, 55 yrs. old  GENDER:  female ENDOSCOPIST:  Dorena Cookey REF. BY: PROCEDURE DATE:  12/11/2011 PROCEDURE: ASA CLASS: INDICATIONS: MEDICATIONS:  Versed 6 mg, fentanyl 50 mcg  DESCRIPTION OF PROCEDURE:   After the risks benefits and alternatives of the procedure were thoroughly explained, informed consent was obtained.  The [id number] ([id number]) endoscope was introduced through the anus and advanced to the , without limitations.  The quality of the prep was .  The instrument was then slowly withdrawn as the colon was fully examined. <<PROCEDUREIMAGES>>  FINDINGS: 8 mm Cecal and ascending polyps snared 1.2-1.5 cm sessile multilobulated polyp in the transverse colon and a very difficult position, could not be snared despite multiple position changes. Knowing that there were multiple other polyps and then she would require fairly close followup I elected to tattoo the area with [place] in and reattempt the procedure in about 3 months. Multiple transverse descending and sigmoid polyps removed by snare ranging from 3-6 mm removed by snare Colonic mucosa was normal and biopsies were taken to rule out microscopic colitis  COMPLICATIONS:  IMPRESSION: Multiple colon polyps removed and one polyp left in place, could not be snared U2 sessile shaped, location behind a fold and difficult positioning, tattooed with [place] 8.  RECOMMENDATIONS: All biopsy results and will need repeat colonoscopy attempt possibly by one of my partners in 3 months.  ______________________________ Dorena Cookey  CC:  n. eSIGNED:   Dorena Cookey at 12/11/2011 10:46 AM  Trilby Leaver, [phone]

## 2011-12-11 NOTE — Progress Notes (Signed)
Subjective:  The patient had a reaction to sedation that resulted in anger and agitation. She seems to have calmed down now. She would like to stay in the hospital for further workup if necessary    Physical Exam: Blood pressure 152/98, pulse 78, temperature 98.1 F (36.7 C), temperature source Oral, resp. rate 54, height 5\' 2"  (1.575 m), weight 89.8 kg (197 lb 15.6 oz), SpO2 94.00%. Alert and oriented x3 CVS: RRR RS: CTAB Abdomen : soft minimal epigastric tenderness tearful and emotionally labile Has a herpes eruption of the upper lip  Investigations:  Recent Results (from the past 240 hour(s))  CLOSTRIDIUM DIFFICILE BY PCR     Status: Normal   Collection Time   12/10/11  2:36 PM      Component Value Range Status Comment   C difficile by pcr NEGATIVE  NEGATIVE  Final      Basic Metabolic Panel:  Basename 12/11/11 0655 12/10/11 0636  NA 146* 145  K 3.2* 3.4*  CL 105 106  CO2 28 29  GLUCOSE 85 95  BUN 5* 5*  CREATININE 0.48* 0.61  CALCIUM 9.7 9.1  MG -- --  PHOS -- --   Liver Function Tests:  [facility] 12/10/11 0636 12/09/11 1856  AST 51* 29  ALT 34 24  ALKPHOS 55 41  BILITOT 0.4 0.4  PROT 7.2 7.4  ALBUMIN 3.8 4.1     CBC:  Basename 12/10/11 0636 12/09/11 1856  WBC 5.2 6.5  NEUTROABS -- 3.5  HGB 12.7 13.0  HCT 39.6 38.4  MCV 85.5 83.7  PLT 164 188    Ct Abdomen Pelvis W Contrast  12/09/2011  *RADIOLOGY REPORT*  Clinical Data: Abdominal pain with nausea, vomiting, diarrhea.  CT ABDOMEN AND PELVIS WITH CONTRAST  Technique:  Multidetector CT imaging of the abdomen and pelvis was performed following the standard protocol during bolus administration of intravenous contrast.  Contrast: 80mL OMNIPAQUE IOHEXOL 300 MG/ML IJ SOLN  Comparison: 11/29/2011  Findings: The liver, spleen, pancreas, adrenal glands, and kidneys are normal.  Gallbladder is been removed.  No dilated bile ducts. Bowel appears normal including the terminal ileum and appendix. Uterus has been  removed.  Ovaries are not visible.  Small periumbilical hernia containing only fat, stable.  No acute osseous abnormality.  IMPRESSION: No acute abnormality of the abdomen or pelvis.  No change since 11/29/2011.  Original Report Authenticated By: Gwynn Burly, M.D.      Medications:  Scheduled:    . acyclovir cream   Topical Q3H  . amLODipine  5 mg Oral QPM  . aspirin EC  81 mg Oral Daily  . cholecalciferol  2,000 Units Oral Daily  . glipiZIDE  5 mg Oral QHS  . irbesartan  150 mg Oral Daily   And  . hydrochlorothiazide  12.5 mg Oral Daily  . insulin aspart  0-9 Units Subcutaneous Q4H  . linagliptin  5 mg Oral Daily  . lipase/protease/amylase  1 capsule Oral TID AC  . venlafaxine  150 mg Oral Q breakfast  . DISCONTD: insulin aspart  0-9 Units Subcutaneous TID WC  . DISCONTD: venlafaxine  75 mg Oral Q breakfast   Continuous:    . sodium chloride 100 mL/hr at 12/11/11 1225   ZOX:[id number], acetaminophen, fentaNYL, labetalol, LORazepam, ondansetron (ZOFRAN) IV, ondansetron, spot ink marker, DISCONTD: butamben-tetracaine-benzocaine, DISCONTD: fentaNYL, DISCONTD: midazolam  Impression:  Principal Problem:  *Nausea, vomiting and diarrhea Active Problems:  Pancreatitis  HTN (hypertension)  Diabetes mellitus, type 2  Depression  No clear explanation for the GI symptoms.  Will see how she responds to advancing the diet       LOS: 2 days   Tykira Wachs, MD Pager: [phone] 12/11/2011, 2:28 PM

## 2011-12-11 NOTE — Progress Notes (Signed)
MD notified of pt's K+ being 3.2  Rachelle Hora, Sharyon Medicus

## 2011-12-11 NOTE — Progress Notes (Signed)
[facility] Progress Note  Subjective: Tolerated bowel prep without vomiting  Objective: Vital signs in last 24 hours: Temp:  [98 F (36.7 C)-98.7 F (37.1 C)] 98.1 F (36.7 C) (03/08 0851) Pulse Rate:  [77-78] 78  (03/08 0517) Resp:  [11-57] 13  (03/08 1045) BP: (116-156)/(62-113) 125/68 mmHg (03/08 1045) SpO2:  [89 %-99 %] 96 % (03/08 1045) Weight change:    PE: Unchanged  Lab Results: Results for orders placed during the hospital encounter of 12/09/11 (from the past 24 hour(s))  GLUCOSE, CAPILLARY     Status: Normal   Collection Time   12/10/11 12:34 PM      Component Value Range   Glucose-Capillary 79  70 - 99 (mg/dL)   Comment 1 Notify RN    CLOSTRIDIUM DIFFICILE BY PCR     Status: Normal   Collection Time   12/10/11  2:36 PM      Component Value Range   C difficile by pcr NEGATIVE  NEGATIVE   GLUCOSE, CAPILLARY     Status: Normal   Collection Time   12/10/11  5:37 PM      Component Value Range   Glucose-Capillary 82  70 - 99 (mg/dL)   Comment 1 Notify RN    GLUCOSE, CAPILLARY     Status: Normal   Collection Time   12/10/11  8:15 PM      Component Value Range   Glucose-Capillary 85  70 - 99 (mg/dL)  GLUCOSE, CAPILLARY     Status: Normal   Collection Time   12/11/11 12:22 AM      Component Value Range   Glucose-Capillary 79  70 - 99 (mg/dL)  GLUCOSE, CAPILLARY     Status: Normal   Collection Time   12/11/11  5:14 AM      Component Value Range   Glucose-Capillary 87  70 - 99 (mg/dL)  BASIC METABOLIC PANEL     Status: Abnormal   Collection Time   12/11/11  6:55 AM      Component Value Range   Sodium 146 (*) 135 - 145 (mEq/L)   Potassium 3.2 (*) 3.5 - 5.1 (mEq/L)   Chloride 105  96 - 112 (mEq/L)   CO2 28  19 - 32 (mEq/L)   Glucose, Bld 85  70 - 99 (mg/dL)   BUN 5 (*) 6 - 23 (mg/dL)   Creatinine, Ser 0.86 (*) 0.50 - 1.10 (mg/dL)   Calcium 9.7  8.4 - 57.8 (mg/dL)   GFR calc non Af Amer >90  >90 (mL/min)   GFR calc Af Amer >90  >90 (mL/min)  GLUCOSE,  CAPILLARY     Status: Normal   Collection Time   12/11/11  8:00 AM      Component Value Range   Glucose-Capillary 81  70 - 99 (mg/dL)  GLUCOSE, CAPILLARY     Status: Normal   Collection Time   12/11/11  8:58 AM      Component Value Range   Glucose-Capillary 80  70 - 99 (mg/dL)    Studies/Results: Ct Abdomen Pelvis W Contrast  12/09/2011  *RADIOLOGY REPORT*  Clinical Data: Abdominal pain with nausea, vomiting, diarrhea.  CT ABDOMEN AND PELVIS WITH CONTRAST  Technique:  Multidetector CT imaging of the abdomen and pelvis was performed following the standard protocol during bolus administration of intravenous contrast.  Contrast: 80mL OMNIPAQUE IOHEXOL 300 MG/ML IJ SOLN  Comparison: 11/29/2011  Findings: The liver, spleen, pancreas, adrenal glands, and kidneys are normal.  Gallbladder is been removed.  No dilated bile ducts. Bowel appears normal including the terminal ileum and appendix. Uterus has been removed.  Ovaries are not visible.  Small periumbilical hernia containing only fat, stable.  No acute osseous abnormality.  IMPRESSION: No acute abnormality of the abdomen or pelvis.  No change since 11/29/2011.  Original Report Authenticated By: Gwynn Burly, M.D.    EGD revealed minimal gastritis, biopsies taken to rule out H. pylori and celiac disease  Colonoscopy revealed several polyps. The largest one was in the transverse colon was in a very difficult position and I could not snare despite moving the patient in several positions. It was injected with [place] in for localization for followup colonoscopy in 3 months. Biopsies were taken of normal mucosa to assess for microscopic colitis.  Assessment: Nausea vomiting diarrhea and abdominal pain, no clear cause from procedures. Await biopsies to rule out celiac disease H. pylori and microscopic colitis Multiple colon polyps 1 colon polyp that could not be removed will need repeat attempt later  Plan: Await all biopsy results Begin full liquid  diet We'll need repeat colonoscopy as an outpatient in 3-6 months    Destiny Robinson C 12/11/2011, 10:49 AM

## 2011-12-11 NOTE — Progress Notes (Signed)
Dr. Madilyn Fireman notified Pts'  K+ 3.2. No orders rec. Macy Mis RN

## 2011-12-11 NOTE — Op Note (Signed)
[facility] [address] [place], [place]  [address]  ENDOSCOPY PROCEDURE REPORT  PATIENT:  Destiny Robinson, Destiny Robinson  MR#:  [id number] BIRTHDATE:  04-Jan-1956, 55 yrs. old  GENDER:  female  ENDOSCOPIST:  Dorena Cookey Referred by:  PROCEDURE DATE:  12/11/2011 PROCEDURE: ASA CLASS: INDICATIONS:nauesea and vomiting  100mg  fentanyl 8 mg  MEDICATIONS:100 mg fentanyl 8 mg versed  TOPICAL ANESTHETIC: Cetacaine spray  DESCRIPTION OF PROCEDURE:   After the risks benefits and alternatives of the procedure were thoroughly explained, informed consent was obtained.  The Pentax Gastroscope [id number] endoscope was introduced through the mouth and advanced to the , without limitations.  The instrument was slowly withdrawn as the mucosa was fully examined. <<PROCEDUREIMAGES>>  FINDINGS: Mild antral gastritis otherwise normal. Biopsies taken of the duodenum and antrum  COMPLICATIONS:  None  ENDOSCOPIC IMPRESSION:  Mild antral gastritis  RECOMMENDATIONS: Await pathology and proceed with colonoscopy  REPEAT EXAM:  ______________________________ Dorena Cookey  CC:  n. eSIGNEDDorena Cookey at 12/11/2011 09:45 AM  Trilby Leaver, [phone]

## 2011-12-12 ENCOUNTER — Inpatient Hospital Stay (HOSPITAL_COMMUNITY): Payer: Self-pay

## 2011-12-12 LAB — GLUCOSE, CAPILLARY: Glucose-Capillary: 89 mg/dL (ref 70–99)

## 2011-12-12 MED ORDER — POTASSIUM CHLORIDE CRYS ER 20 MEQ PO TBCR
40.0000 meq | EXTENDED_RELEASE_TABLET | Freq: Once | ORAL | Status: AC
  Administered 2011-12-12: 40 meq via ORAL
  Filled 2011-12-12: qty 2

## 2011-12-12 MED ORDER — OXYCODONE-ACETAMINOPHEN 5-325 MG PO TABS: 1.0000 | ORAL_TABLET | ORAL | Status: DC | PRN

## 2011-12-12 MED ORDER — TETANUS-DIPHTH-ACELL PERTUSSIS 5-2.5-18.5 LF-MCG/0.5 IM SUSP
0.5000 mL | Freq: Once | INTRAMUSCULAR | Status: AC
  Administered 2011-12-12: 0.5 mL via INTRAMUSCULAR
  Filled 2011-12-12: qty 0.5

## 2011-12-12 MED ORDER — DIPHENHYDRAMINE HCL 50 MG/ML IJ SOLN: 12.5000 mg | Freq: Four times a day (QID) | INTRAMUSCULAR | Status: DC | PRN

## 2011-12-12 MED ORDER — METFORMIN HCL 500 MG PO TABS
1000.0000 mg | ORAL_TABLET | Freq: Two times a day (BID) | ORAL | Status: DC
  Administered 2011-12-12 – 2011-12-13 (×3): 1000 mg via ORAL
  Filled 2011-12-12 (×5): qty 2

## 2011-12-12 MED ORDER — EZETIMIBE-SIMVASTATIN 10-40 MG PO TABS
1.0000 | ORAL_TABLET | Freq: Every day | ORAL | Status: DC
  Administered 2011-12-12: 1 via ORAL
  Filled 2011-12-12 (×2): qty 1

## 2011-12-12 MED ORDER — ADULT MULTIVITAMIN W/MINERALS CH
1.0000 | ORAL_TABLET | Freq: Every day | ORAL | Status: DC
  Administered 2011-12-12 – 2011-12-13 (×2): 1 via ORAL
  Filled 2011-12-12 (×2): qty 1

## 2011-12-12 MED ORDER — SODIUM CHLORIDE 0.9 % IV SOLN: INTRAVENOUS | Status: DC

## 2011-12-12 MED ORDER — FLUCONAZOLE 100 MG PO TABS
100.0000 mg | ORAL_TABLET | ORAL | Status: DC
  Administered 2011-12-12: 100 mg via ORAL
  Filled 2011-12-12: qty 1

## 2011-12-12 MED ORDER — FENOFIBRATE 160 MG PO TABS
160.0000 mg | ORAL_TABLET | Freq: Every day | ORAL | Status: DC
  Administered 2011-12-12 – 2011-12-13 (×2): 160 mg via ORAL
  Filled 2011-12-12 (×2): qty 1

## 2011-12-12 MED ORDER — VITAMIN C 500 MG PO TABS
500.0000 mg | ORAL_TABLET | Freq: Every day | ORAL | Status: DC
  Administered 2011-12-12 – 2011-12-13 (×2): 500 mg via ORAL
  Filled 2011-12-12 (×2): qty 1

## 2011-12-12 NOTE — Progress Notes (Addendum)
Subjective:  No further nausea and vomiting  Physical Exam: Blood pressure 131/70, pulse 75, temperature 98.6 F (37 C), temperature source Oral, resp. rate 18, height 5\' 2"  (1.575 m), weight 89.8 kg (197 lb 15.6 oz), SpO2 97.00%. Patient Vitals for the past 24 hrs:  BP Temp Temp src Pulse Resp SpO2  12/12/11 1400 131/70 mmHg 98.6 F (37 C) Oral 75  18  97 %  12/12/11 0850 131/74 mmHg 98.1 F (36.7 C) Oral 84  18  99 %  12/12/11 0623 137/83 mmHg 98.9 F (37.2 C) Oral 70  18  99 %  12/12/11 0228 143/89 mmHg 97.5 F (36.4 C) Oral 75  18  98 %  12/11/11 2133 145/70 mmHg 98.3 F (36.8 C) Oral 76  18  96 %   Alert oriented x3 Chest clear to auscultation without wheeze rhonchi crackles Abdomen soft nontender bowel sounds are present Left foot third toe has bluish discoloration less than 0.5 cm skin excoriation on the dorsal aspect  Investigations:  Recent Results (from the past 240 hour(s))  CLOSTRIDIUM DIFFICILE BY PCR     Status: Normal   Collection Time   12/10/11  2:36 PM      Component Value Range Status Comment   C difficile by pcr NEGATIVE  NEGATIVE  Final      Basic Metabolic Panel:  Basename 12/11/11 0655 12/10/11 0636  NA 146* 145  K 3.2* 3.4*  CL 105 106  CO2 28 29  GLUCOSE 85 95  BUN 5* 5*  CREATININE 0.48* 0.61  CALCIUM 9.7 9.1  MG -- --  PHOS -- --   Liver Function Tests:  [facility] 12/10/11 0636 12/09/11 1856  AST 51* 29  ALT 34 24  ALKPHOS 55 41  BILITOT 0.4 0.4  PROT 7.2 7.4  ALBUMIN 3.8 4.1     CBC:  Basename 12/10/11 0636 12/09/11 1856  WBC 5.2 6.5  NEUTROABS -- 3.5  HGB 12.7 13.0  HCT 39.6 38.4  MCV 85.5 83.7  PLT 164 188    No results found.  I have personally reviewed the x-ray of the left foot and I do not see a fracture of the phalanx  Medications:  Scheduled:    . acyclovir cream   Topical Q3H  . amLODipine  5 mg Oral QPM  . aspirin EC  81 mg Oral Daily  . cholecalciferol  2,000 Units Oral Daily  .  ezetimibe-simvastatin  1 tablet Oral QHS  . fenofibrate  160 mg Oral Daily  . fluconazole  100 mg Oral QODAY  . glipiZIDE  5 mg Oral Q breakfast  . irbesartan  150 mg Oral Daily   And  . hydrochlorothiazide  12.5 mg Oral Daily  . insulin aspart  0-9 Units Subcutaneous Q4H  . linagliptin  5 mg Oral Daily  . lipase/protease/amylase  1 capsule Oral TID AC  . metFORMIN  1,000 mg Oral BID WC  . mulitivitamin with minerals  1 tablet Oral Daily  . potassium chloride  40 mEq Oral Once  . venlafaxine  150 mg Oral Q breakfast  . vitamin C  500 mg Oral Daily   Colonoscopy 12/11/11 IMPRESSION: Multiple colon polyps removed and one polyp left in  place, could not be snared U2 sessile shaped, location behind a  fold and difficult positioning, tattooed with [place] 8.  RECOMMENDATIONS: All biopsy results and will need repeat  colonoscopy attempt possibly by one of my partners in 3 months. EGD 12/11/11 ENDOSCOPIC IMPRESSION:  Mild antral gastritis   Continuous:   VHQ:IONGEXBMWUXLK, acetaminophen, fentaNYL, labetalol, LORazepam, ondansetron (ZOFRAN) IV, ondansetron, spot ink marker  Yesterday the patient pulled  the drawer on her toe.   Impression:  Principal Problem:  *Nausea, vomiting and diarrhea Active Problems:  Pancreatitis  HTN (hypertension)  Diabetes mellitus, type 2  Depression  The plan is to advance the diet, resume home medications, and see if the diarrhea nausea vomiting reappears  The radiologist called for a distal phalanx fracture- will proceed with prn percocet    LOS: 3 days   Areyanna Figeroa, MD Pager: [phone] 12/12/2011, 4:28 PM

## 2011-12-12 NOTE — Progress Notes (Signed)
Subjective: Less diarrhea and abdominal pain. Is hungry.  Objective: Vital signs in last 24 hours: Temp:  [97.5 F (36.4 C)-98.9 F (37.2 C)] 98.9 F (37.2 C) (03/09 0623) Pulse Rate:  [70-89] 70  (03/09 0623) Resp:  [11-54] 18  (03/09 0623) BP: (121-168)/(62-106) 137/83 mmHg (03/09 0623) SpO2:  [89 %-99 %] 99 % (03/09 0623) Weight change:  Last BM Date: 12/11/11  PE: GEN:  NAD, older appearing than stated age ABD:  Protuberant, soft, active bowel sounds  Lab Results: CBC    Component Value Date/Time   WBC 5.2 12/10/2011 0636   RBC 4.63 12/10/2011 0636   HGB 12.7 12/10/2011 0636   HCT 39.6 12/10/2011 0636   PLT 164 12/10/2011 0636   MCV 85.5 12/10/2011 0636   MCH 27.4 12/10/2011 0636   MCHC 32.1 12/10/2011 0636   RDW 13.7 12/10/2011 0636   LYMPHSABS 2.5 12/09/2011 1856   MONOABS 0.3 12/09/2011 1856   EOSABS 0.1 12/09/2011 1856   BASOSABS 0.1 12/09/2011 1856   CMP     Component Value Date/Time   NA 146* 12/11/2011 0655   K 3.2* 12/11/2011 0655   CL 105 12/11/2011 0655   CO2 28 12/11/2011 0655   GLUCOSE 85 12/11/2011 0655   BUN 5* 12/11/2011 0655   CREATININE 0.48* 12/11/2011 0655   CALCIUM 9.7 12/11/2011 0655   PROT 7.2 12/10/2011 0636   ALBUMIN 3.8 12/10/2011 0636   AST 51* 12/10/2011 0636   ALT 34 12/10/2011 0636   ALKPHOS 55 12/10/2011 0636   BILITOT 0.4 12/10/2011 0636   GFRNONAA >90 12/11/2011 0655   GFRAA >90 12/11/2011 1610   -Endoscopy:  Negative (small bowel biopsies pending)  - Colonoscopy:  Multiple small polyps (removed); larger polyp (site tattooed, not removed); random biopsies for microscopic colitis were obtained.  Assessment:  1.  Nausea, vomiting. Unclear etiology.  Improving with supportive care. 2.  Diarrhea.  Unclear etiology.  Negative evaluation to date.  Seemingly improving, but she has not been eating much since admission. 3.  Colon polyps.  Most removed.  Larger transverse polyp site was tattooed, but was not removed.  Plan:  1.  Advance diet. 2.  Supportive care with prn  antiemetics; if nausea/vomiting becomes more problematic, might need to consider scheduled antiemetics.  If symptoms persist still, might have to consider gastric emptying study +/- abdominal ultrasound. 3.  Continue pancreatic enzymes for now, pending endoscopy and colonoscopy biopsy results. 4.  Monitor stool output with increase in diet. 5.  If she tolerates increase in diet without prohibitive diarrhea or nausea/vomiting, would consider discharge tomorrow with outpatient follow-up with Dr. Dorena Cookey.   Destiny Robinson 12/12/2011, 10:07 AM

## 2011-12-13 LAB — GLUCOSE, CAPILLARY
Glucose-Capillary: 108 mg/dL — ABNORMAL HIGH (ref 70–99)
Glucose-Capillary: 118 mg/dL — ABNORMAL HIGH (ref 70–99)

## 2011-12-13 MED ORDER — OXYCODONE-ACETAMINOPHEN 5-325 MG PO TABS: 1.0000 | ORAL_TABLET | ORAL | Status: AC | PRN

## 2011-12-13 MED ORDER — ACYCLOVIR 5 % EX CREA: 1.0000 "application " | TOPICAL_CREAM | CUTANEOUS | Status: DC

## 2011-12-13 NOTE — Discharge Summary (Signed)
Patient ID: Destiny Robinson MRN: [id number] DOB/AGE: 1956/04/14 55 y.o. 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 Madilyn Fireman   Medication List  As of 12/13/2011  3:07 PM   START taking these medications         acyclovir cream 5 %   Commonly known as: ZOVIRAX   Apply 1 application topically every 3 (three) hours.         CONTINUE taking these medications         amLODipine 5 MG tablet   Commonly known as: NORVASC      aspirin EC 81 MG tablet      CREON PO      desvenlafaxine 50 MG 24 hr tablet   Commonly known as: PRISTIQ      ezetimibe-simvastatin 10-40 MG per tablet   Commonly known as: VYTORIN      fluconazole 100 MG tablet   Commonly known as: DIFLUCAN      glipiZIDE 5 MG 24 hr tablet   Commonly known as: GLUCOTROL XL      LORazepam 0.5 MG tablet   Commonly known as: ATIVAN      Lysine 500 MG Caps      metFORMIN 500 MG tablet   Commonly known as: GLUCOPHAGE      mulitivitamin with minerals Tabs      omeprazole 40 MG capsule   Commonly known as: PRILOSEC      oxyCODONE-acetaminophen 5-325 MG per tablet   Commonly known as: PERCOCET   Take 1 tablet by mouth every 4 (four) hours as needed for pain.      potassium chloride 10 MEQ tablet   Commonly known as: K-DUR      sitaGLIPtin 100 MG tablet   Commonly known as: JANUVIA      TRILIPIX 135 MG capsule   Generic drug: Choline Fenofibrate      valsartan-hydrochlorothiazide 160-12.5 MG per tablet   Commonly known as: DIOVAN-HCT      vitamin  C 500 MG tablet   Commonly known as: ASCORBIC ACID      Vitamin D 2000 UNITS tablet         STOP taking these medications         metoCLOPramide 10 MG tablet          Where to get your medications    These are the prescriptions that you need to pick up.   You may get these medications from any pharmacy.         oxyCODONE-acetaminophen 5-325 MG per tablet         Information on where to get these meds is not yet available. Ask your nurse or doctor.         acyclovir cream 5 %            Discharged Condition: Patient is tolerating a regular diet without abdominal pain nausea or vomiting   Consults: Dr. Dorena Cookey from gastroenterology  Significant Diagnostic Studies: Ct Abdomen Pelvis W Contrast  12/09/2011  *RADIOLOGY REPORT*  Clinical Data: Abdominal pain with  nausea, vomiting, diarrhea.  CT ABDOMEN AND PELVIS WITH CONTRAST  Technique:  Multidetector CT imaging of the abdomen and pelvis was performed following the standard protocol during bolus administration of intravenous contrast.  Contrast: 80mL OMNIPAQUE IOHEXOL 300 MG/ML IJ SOLN  Comparison: 11/29/2011  Findings: The liver, spleen, pancreas, adrenal glands, and kidneys are normal.  Gallbladder is been removed.  No dilated bile ducts. Bowel appears normal including the terminal ileum and appendix. Uterus has been removed.  Ovaries are not visible.  Small periumbilical hernia containing only fat, stable.  No acute osseous abnormality.  IMPRESSION: No acute abnormality of the abdomen or pelvis.  No change since 11/29/2011.  Original Report Authenticated By: Gwynn Burly, M.D.   Ct Abdomen Pelvis W Contrast  11/29/2011  *RADIOLOGY REPORT*  Clinical Data: 56 year old female with abdominal and pelvic pain, nausea and vomiting.  CT ABDOMEN AND PELVIS WITH CONTRAST  Technique:  Multidetector CT imaging of the abdomen and pelvis was performed following the standard protocol during bolus administration of intravenous  contrast.  Contrast: OMNIPAQUE IOHEXOL 300 MG/ML IV SOLN  Comparison: None  Findings: Probable fatty infiltration of the liver is noted. No focal hepatic abnormalities are present. The spleen, adrenal glands, pancreas, and kidneys are unremarkable. The patient is status post cholecystectomy and hysterectomy.  No free fluid, enlarged lymph nodes, biliary dilation or abdominal aortic aneurysm identified. The bowel, appendix and bladder are unremarkable.  A small to moderate periumbilical hernia containing fat is noted.  No acute or suspicious bony abnormalities are identified.  IMPRESSION: No evidence of acute abnormality.  Probable fatty infiltration of the liver.  Small to moderate periumbilical hernia containing fat.  Original Report Authenticated By: Rosendo Gros, M.D.   Dg Foot 2 Views Left  12/12/2011  *RADIOLOGY REPORT*  Clinical Data: Trauma to the third toe.  LEFT FOOT - 2 VIEW  Comparison: None.  Findings: There is a fracture of the distal phalanx of the third toe.  No displaced fragments are seen.  The remainder the foot appears negative.  IMPRESSION: Fracture of the distal phalanx of the third toe.  Original Report Authenticated By: Thomasenia Sales, M.D.    Lab Results: Results for orders placed during the hospital encounter of 12/09/11 (from the past 48 hour(s))  GLUCOSE, CAPILLARY     Status: Abnormal   Collection Time   12/11/11  3:58 PM      Component Value Range Comment   Glucose-Capillary 106 (*) 70 - 99 (mg/dL)   GLUCOSE, CAPILLARY     Status: Normal   Collection Time   12/11/11  8:19 PM      Component Value Range Comment   Glucose-Capillary 96  70 - 99 (mg/dL)   GLUCOSE, CAPILLARY     Status: Normal   Collection Time   12/11/11 11:53 PM      Component Value Range Comment   Glucose-Capillary 93  70 - 99 (mg/dL)   GLUCOSE, CAPILLARY     Status: Abnormal   Collection Time   12/12/11  4:09 AM      Component Value Range Comment   Glucose-Capillary 110 (*) 70 - 99 (mg/dL)     GLUCOSE, CAPILLARY     Status: Abnormal   Collection Time   12/12/11  8:13 AM      Component Value Range Comment   Glucose-Capillary 102 (*) 70 - 99 (mg/dL)    Comment 1 Documented in Chart      Comment 2 Notify RN  GLUCOSE, CAPILLARY     Status: Normal   Collection Time   12/12/11 11:57 AM      Component Value Range Comment   Glucose-Capillary 87  70 - 99 (mg/dL)    Comment 1 Documented in Chart      Comment 2 Notify RN     GLUCOSE, CAPILLARY     Status: Abnormal   Collection Time   12/12/11  4:04 PM      Component Value Range Comment   Glucose-Capillary 102 (*) 70 - 99 (mg/dL)    Comment 1 Documented in Chart      Comment 2 Notify RN     GLUCOSE, CAPILLARY     Status: Normal   Collection Time   12/12/11  8:07 PM      Component Value Range Comment   Glucose-Capillary 89  70 - 99 (mg/dL)    Comment 1 Notify RN      Comment 2 Documented in Chart     GLUCOSE, CAPILLARY     Status: Abnormal   Collection Time   12/12/11 11:59 PM      Component Value Range Comment   Glucose-Capillary 108 (*) 70 - 99 (mg/dL)    Comment 1 Notify RN      Comment 2 Documented in Chart     GLUCOSE, CAPILLARY     Status: Abnormal   Collection Time   12/13/11  3:11 AM      Component Value Range Comment   Glucose-Capillary 118 (*) 70 - 99 (mg/dL)    Comment 1 Notify RN      Comment 2 Documented in Chart     GLUCOSE, CAPILLARY     Status: Abnormal   Collection Time   12/13/11  6:49 AM      Component Value Range Comment   Glucose-Capillary 137 (*) 70 - 99 (mg/dL)    Comment 1 Documented in Chart      Comment 2 Notify RN      Recent Results (from the past 240 hour(s))  STOOL CULTURE     Status: Normal (Preliminary result)   Collection Time   12/10/11  2:35 PM      Component Value Range Status Comment   Specimen Description STOOL   Final    Special Requests NONE   Final    Culture NO GROWTH 1 DAY   Final    Report Status PENDING   Incomplete   CLOSTRIDIUM DIFFICILE BY PCR     Status: Normal    Collection Time   12/10/11  2:36 PM      Component Value Range Status Comment   C difficile by pcr NEGATIVE  NEGATIVE  Final      Hospital Course:  1. reported nausea vomiting and diarrhea. Patient was admitted from an outside emergency room with complaints of recurrent nausea vomiting diarrhea and abdominal pain. She were to have an outpatient visit for similar symptoms and was started on pancreatic enzymes by Dr. Evette Cristal. Dr. Madilyn Fireman proceeded with endoscopy and colonoscopy on hospital day #2 without any findings to explain the symptoms.  Postprocedure the patient developed acute psychotic rage which resolved after the sedation wore off. She will followup in the office of the gastroenterologists for the results of the pathology report. 2. diabetes mellitus - patient was kept on a sliding scale insulin while in-house, metformin was held for 48 hours, by the time of discharge all medications be resumed as before. 3. on December 11, 2011 the patient pulled a  drawer that was under the sink in her room and a drawer fell on her toe. There is a skin excoriation of less than 0.5 cm on top of the toe, and the toe is blue in color from the resultant hemorrhage. X-ray reveals fracture of the distal phalanx. The patient was placed on a postoperative hard sole shoe, and was told to followup with orthopedic surgeon [place] Dr. Fuller Canada. She was provided with Percocet for pain   Discharge Exam: Robinson pressure 135/76, pulse 81, temperature 98.1 F (36.7 C), temperature source Oral, resp. rate 17, height 5\' 2"  (1.575 m), weight 89.8 kg (197 lb 15.6 oz), SpO2 93.00%. Alert and oriented x3 CVS: RRR RS: CTAB Abdomen : soft, NT   Disposition:  home  Discharge Orders    Future Orders Please Complete By Expires   Diet Carb Modified      Increase activity slowly         Follow-up Information    Schedule an appointment as soon as possible for a visit with Josue Hector, MD.   Contact  information:   [address] [place] [address] [phone]       Schedule an appointment as soon as possible for a visit with Fuller Canada, MD.   Contact information:   [address], [address] [place] [address] [phone]       Schedule an appointment as soon as possible for a visit with HAYES,JOHN C, MD.   Contact information:   [address], [address] [employer], [place]. [place] [address] [phone]          Signed: Lonia Robinson 12/13/2011, 3:07 PM

## 2011-12-13 NOTE — Progress Notes (Addendum)
Telephone order for d/c EGD selected T. Outlaw,should be W. Jamse Arn

## 2011-12-13 NOTE — Progress Notes (Signed)
Orthopedic Tech Progress Note Patient Details:  Destiny Robinson September 19, 1956 [id number]  Other Ortho Devices Type of Ortho Device: Postop boot Ortho Device Location: left foot Ortho Device Interventions: Application   Debe Anfinson T 12/13/2011, 12:55 PM

## 2011-12-13 NOTE — Progress Notes (Signed)
Subjective: Diarrhea has resolved. No further pain. Tolerating diet.  Objective: Vital signs in last 24 hours: Temp:  [98 F (36.7 C)-98.6 F (37 C)] 98.1 F (36.7 C) (03/10 0606) Pulse Rate:  [75-86] 81  (03/10 0606) Resp:  [17-18] 17  (03/10 0606) BP: (131-151)/(70-89) 135/76 mmHg (03/10 0606) SpO2:  [93 %-97 %] 93 % (03/10 0606) Weight change:  Last BM Date: 12/12/11  PE: GEN:  NAD ABD:  Soft, non-tender  Assessment:  1.  Abdominal pain, resolved. 2.  Diarrhea, resolved while in-hospital, unclear etiology. 3.  Large colon polyp, tattooed, but not removed.  Plan:  1.  OK to discharge home from GI perspective. 2.  Would send home on pancreatic enzymes. 3.  Will sign off; please call with any questions. 4.  Dr. Madilyn Fireman will see her as outpatient, to follow biopsies and orchestrate repeat colonoscopy.   Destiny Robinson 12/13/2011, 10:44 AM

## 2011-12-14 ENCOUNTER — Encounter (HOSPITAL_COMMUNITY): Payer: Self-pay | Admitting: Gastroenterology

## 2011-12-14 LAB — STOOL CULTURE

## 2011-12-21 ENCOUNTER — Inpatient Hospital Stay (HOSPITAL_COMMUNITY)
Admission: EM | Admit: 2011-12-21 | Discharge: 2011-12-24 | DRG: 919 | Disposition: A | Discharge status: Home / Self Care | Payer: Self-pay | Attending: Family Medicine | Admitting: Family Medicine

## 2011-12-21 ENCOUNTER — Encounter (HOSPITAL_COMMUNITY): Payer: Self-pay | Admitting: *Deleted

## 2011-12-21 DIAGNOSIS — R109 Unspecified abdominal pain: Secondary | ICD-10-CM | POA: Diagnosis present

## 2011-12-21 DIAGNOSIS — L089 Local infection of the skin and subcutaneous tissue, unspecified: Secondary | ICD-10-CM

## 2011-12-21 DIAGNOSIS — K625 Hemorrhage of anus and rectum: Secondary | ICD-10-CM

## 2011-12-21 DIAGNOSIS — Y849 Medical procedure, unspecified as the cause of abnormal reaction of the patient, or of later complication, without mention of misadventure at the time of the procedure: Secondary | ICD-10-CM | POA: Diagnosis present

## 2011-12-21 DIAGNOSIS — E119 Type 2 diabetes mellitus without complications: Secondary | ICD-10-CM | POA: Diagnosis present

## 2011-12-21 DIAGNOSIS — J45909 Unspecified asthma, uncomplicated: Secondary | ICD-10-CM | POA: Diagnosis present

## 2011-12-21 DIAGNOSIS — IMO0002 Reserved for concepts with insufficient information to code with codable children: Principal | ICD-10-CM | POA: Diagnosis present

## 2011-12-21 DIAGNOSIS — T39095A Adverse effect of salicylates, initial encounter: Secondary | ICD-10-CM | POA: Diagnosis present

## 2011-12-21 DIAGNOSIS — F329 Major depressive disorder, single episode, unspecified: Secondary | ICD-10-CM | POA: Diagnosis present

## 2011-12-21 DIAGNOSIS — G8929 Other chronic pain: Secondary | ICD-10-CM | POA: Diagnosis present

## 2011-12-21 DIAGNOSIS — K859 Acute pancreatitis without necrosis or infection, unspecified: Secondary | ICD-10-CM | POA: Diagnosis present

## 2011-12-21 DIAGNOSIS — F3289 Other specified depressive episodes: Secondary | ICD-10-CM | POA: Diagnosis present

## 2011-12-21 DIAGNOSIS — E7800 Pure hypercholesterolemia, unspecified: Secondary | ICD-10-CM | POA: Diagnosis present

## 2011-12-21 DIAGNOSIS — I10 Essential (primary) hypertension: Secondary | ICD-10-CM | POA: Diagnosis present

## 2011-12-21 DIAGNOSIS — K922 Gastrointestinal hemorrhage, unspecified: Secondary | ICD-10-CM | POA: Diagnosis present

## 2011-12-21 HISTORY — DX: Adverse effect of unspecified anesthetic, initial encounter: T41.45XA

## 2011-12-21 HISTORY — DX: Personal history of colonic polyps: Z86.010

## 2011-12-21 HISTORY — DX: Other specified postprocedural states: Z98.890

## 2011-12-21 HISTORY — DX: Depression, unspecified: F32.A

## 2011-12-21 HISTORY — DX: Gastro-esophageal reflux disease without esophagitis: K21.9

## 2011-12-21 HISTORY — DX: Gastrointestinal hemorrhage, unspecified: K92.2

## 2011-12-21 HISTORY — DX: Type 2 diabetes mellitus without complications: E11.9

## 2011-12-21 HISTORY — DX: Malignant (primary) neoplasm, unspecified: C80.1

## 2011-12-21 HISTORY — DX: Major depressive disorder, single episode, unspecified: F32.9

## 2011-12-21 LAB — CBC
HCT: 32.9 % — ABNORMAL LOW (ref 36.0–46.0)
Hemoglobin: 12.7 g/dL (ref 12.0–15.0)
Hemoglobin: 10.9 g/dL — ABNORMAL LOW (ref 12.0–15.0)
MCH: 27.7 pg (ref 26.0–34.0)
MCH: 27.9 pg (ref 26.0–34.0)
MCHC: 33.1 g/dL (ref 30.0–36.0)
MCV: 84.1 fL (ref 78.0–100.0)
RBC: 4.59 MIL/uL (ref 3.87–5.11)

## 2011-12-21 LAB — HEPATIC FUNCTION PANEL
ALT: 15 U/L (ref 0–35)
AST: 23 U/L (ref 0–37)
Alkaline Phosphatase: 35 U/L — ABNORMAL LOW (ref 39–117)
Bilirubin, Direct: <0.1 mg/dL (ref 0.0–0.3)
Total Bilirubin: 0.2 mg/dL — ABNORMAL LOW (ref 0.3–1.2)

## 2011-12-21 LAB — DIFFERENTIAL
Eosinophils Absolute: 0.2 10*3/uL (ref 0.0–0.7)
Lymphs Abs: 2.2 10*3/uL (ref 0.7–4.0)
Monocytes Relative: 7 % (ref 3–12)
Neutro Abs: 3.3 10*3/uL (ref 1.7–7.7)
Neutrophils Relative %: 54 % (ref 43–77)

## 2011-12-21 LAB — LIPID PANEL
Cholesterol: 101 mg/dL (ref 0–200)
HDL: 28 mg/dL — ABNORMAL LOW (ref >39)
LDL Cholesterol: 37 mg/dL (ref 0–99)
Total CHOL/HDL Ratio: 3.6 RATIO
Triglycerides: 180 mg/dL — ABNORMAL HIGH (ref <150)
VLDL: 36 mg/dL (ref 0–40)

## 2011-12-21 LAB — BASIC METABOLIC PANEL
BUN: 25 mg/dL — ABNORMAL HIGH (ref 6–23)
Chloride: 103 mEq/L (ref 96–112)
Glucose, Bld: 97 mg/dL (ref 70–99)
Potassium: 3.7 mEq/L (ref 3.5–5.1)

## 2011-12-21 LAB — HEMOGLOBIN AND HEMATOCRIT, BLOOD
HCT: 32.4 % — ABNORMAL LOW (ref 36.0–46.0)
Hemoglobin: 10.7 g/dL — ABNORMAL LOW (ref 12.0–15.0)

## 2011-12-21 LAB — URINALYSIS, ROUTINE W REFLEX MICROSCOPIC
Bilirubin Urine: NEGATIVE
Glucose, UA: NEGATIVE mg/dL
Hgb urine dipstick: NEGATIVE
Ketones, ur: NEGATIVE mg/dL
Protein, ur: NEGATIVE mg/dL
Urobilinogen, UA: 0.2 mg/dL (ref 0.0–1.0)

## 2011-12-21 LAB — GLUCOSE, CAPILLARY: Glucose-Capillary: 89 mg/dL (ref 70–99)

## 2011-12-21 LAB — URINE MICROSCOPIC-ADD ON

## 2011-12-21 MED ORDER — DOXYCYCLINE HYCLATE 100 MG PO TABS
100.0000 mg | ORAL_TABLET | Freq: Two times a day (BID) | ORAL | Status: DC
  Administered 2011-12-21 – 2011-12-24 (×6): 100 mg via ORAL
  Filled 2011-12-21 (×7): qty 1

## 2011-12-21 MED ORDER — SODIUM CHLORIDE 0.9 % IV SOLN
1000.0000 mL | INTRAVENOUS | Status: DC
  Administered 2011-12-21: 1000 mL via INTRAVENOUS

## 2011-12-21 MED ORDER — OXYCODONE-ACETAMINOPHEN 5-325 MG PO TABS: 1.0000 | ORAL_TABLET | ORAL | Status: DC | PRN

## 2011-12-21 MED ORDER — AMLODIPINE BESYLATE 5 MG PO TABS
5.0000 mg | ORAL_TABLET | Freq: Every evening | ORAL | Status: DC
  Administered 2011-12-21 – 2011-12-23 (×3): 5 mg via ORAL
  Filled 2011-12-21 (×4): qty 1

## 2011-12-21 MED ORDER — ONDANSETRON HCL 4 MG/2ML IJ SOLN
4.0000 mg | Freq: Four times a day (QID) | INTRAMUSCULAR | Status: DC | PRN
  Administered 2011-12-22: 4 mg via INTRAVENOUS
  Filled 2011-12-21: qty 2

## 2011-12-21 MED ORDER — ACETAMINOPHEN 325 MG PO TABS
650.0000 mg | ORAL_TABLET | Freq: Once | ORAL | Status: AC
  Administered 2011-12-21: 650 mg via ORAL
  Filled 2011-12-21: qty 2

## 2011-12-21 MED ORDER — LINAGLIPTIN 5 MG PO TABS
5.0000 mg | ORAL_TABLET | Freq: Every day | ORAL | Status: DC
  Administered 2011-12-21: 5 mg via ORAL
  Filled 2011-12-21: qty 1

## 2011-12-21 MED ORDER — ZOLPIDEM TARTRATE 5 MG PO TABS: 5.0000 mg | ORAL_TABLET | Freq: Every evening | ORAL | Status: DC | PRN

## 2011-12-21 MED ORDER — ACETAMINOPHEN 650 MG RE SUPP: 650.0000 mg | Freq: Four times a day (QID) | RECTAL | Status: DC | PRN

## 2011-12-21 MED ORDER — INSULIN ASPART 100 UNIT/ML ~~LOC~~ SOLN
0.0000 [IU] | Freq: Three times a day (TID) | SUBCUTANEOUS | Status: DC
  Administered 2011-12-23 – 2011-12-24 (×2): 2 [IU] via SUBCUTANEOUS

## 2011-12-21 MED ORDER — SODIUM CHLORIDE 0.9 % IV SOLN
INTRAVENOUS | Status: DC
  Administered 2011-12-21: 1000 mL via INTRAVENOUS
  Administered 2011-12-21 – 2011-12-22 (×2): via INTRAVENOUS
  Administered 2011-12-23: 150 mL/h via INTRAVENOUS
  Administered 2011-12-23 (×2): via INTRAVENOUS

## 2011-12-21 MED ORDER — SODIUM CHLORIDE 0.9 % IV SOLN: INTRAVENOUS | Status: DC

## 2011-12-21 MED ORDER — METRONIDAZOLE 500 MG PO TABS
500.0000 mg | ORAL_TABLET | Freq: Two times a day (BID) | ORAL | Status: DC
  Administered 2011-12-21 – 2011-12-22 (×3): 500 mg via ORAL
  Filled 2011-12-21 (×3): qty 1

## 2011-12-21 MED ORDER — DESVENLAFAXINE SUCCINATE ER 50 MG PO TB24
50.0000 mg | ORAL_TABLET | Freq: Every day | ORAL | Status: DC
  Filled 2011-12-21: qty 1

## 2011-12-21 MED ORDER — PANTOPRAZOLE SODIUM 40 MG PO TBEC
40.0000 mg | DELAYED_RELEASE_TABLET | Freq: Every day | ORAL | Status: DC
  Administered 2011-12-22 – 2011-12-24 (×3): 40 mg via ORAL
  Filled 2011-12-21 (×3): qty 1

## 2011-12-21 MED ORDER — DESVENLAFAXINE SUCCINATE ER 50 MG PO TB24
50.0000 mg | ORAL_TABLET | Freq: Every day | ORAL | Status: DC
  Filled 2011-12-21 (×2): qty 1

## 2011-12-21 MED ORDER — ALUM & MAG HYDROXIDE-SIMETH 200-200-20 MG/5ML PO SUSP: 30.0000 mL | Freq: Four times a day (QID) | ORAL | Status: DC | PRN

## 2011-12-21 MED ORDER — LORAZEPAM 0.5 MG PO TABS
0.5000 mg | ORAL_TABLET | Freq: Three times a day (TID) | ORAL | Status: DC | PRN
  Administered 2011-12-21 – 2011-12-22 (×3): 0.5 mg via ORAL
  Filled 2011-12-21 (×3): qty 1

## 2011-12-21 MED ORDER — SIMVASTATIN 40 MG PO TABS
20.0000 mg | ORAL_TABLET | Freq: Every day | ORAL | Status: DC
  Administered 2011-12-21: 20 mg via ORAL
  Filled 2011-12-21: qty 0.5

## 2011-12-21 MED ORDER — VITAMIN D3 25 MCG (1000 UNIT) PO TABS
2000.0000 [IU] | ORAL_TABLET | Freq: Every day | ORAL | Status: DC
  Administered 2011-12-21 – 2011-12-24 (×3): 2000 [IU] via ORAL
  Filled 2011-12-21 (×4): qty 2

## 2011-12-21 MED ORDER — DICYCLOMINE HCL 20 MG PO TABS
20.0000 mg | ORAL_TABLET | Freq: Three times a day (TID) | ORAL | Status: DC
  Administered 2011-12-21 – 2011-12-24 (×8): 20 mg via ORAL
  Filled 2011-12-21 (×11): qty 1

## 2011-12-21 MED ORDER — ACETAMINOPHEN 325 MG PO TABS
650.0000 mg | ORAL_TABLET | Freq: Four times a day (QID) | ORAL | Status: DC | PRN
  Administered 2011-12-22 – 2011-12-23 (×5): 650 mg via ORAL
  Filled 2011-12-21 (×5): qty 2

## 2011-12-21 MED ORDER — VITAMIN D 50 MCG (2000 UT) PO TABS: 2000.0000 [IU] | ORAL_TABLET | Freq: Every day | ORAL | Status: DC

## 2011-12-21 MED ORDER — ONDANSETRON HCL 4 MG PO TABS: 4.0000 mg | ORAL_TABLET | Freq: Four times a day (QID) | ORAL | Status: DC | PRN

## 2011-12-21 MED ORDER — VITAMIN C 500 MG PO TABS
500.0000 mg | ORAL_TABLET | Freq: Every day | ORAL | Status: DC
  Administered 2011-12-21 – 2011-12-24 (×3): 500 mg via ORAL
  Filled 2011-12-21 (×4): qty 1

## 2011-12-21 NOTE — ED Notes (Signed)
Patient is resting comfortably. 

## 2011-12-21 NOTE — ED Notes (Signed)
Pt comes to the ED complaining of rectal bleeding that she has been having every 30 minutes.  Pt noticed bright red blood and clots in the toilet when she has a  bowel movement.  Pt reports that she was diagnosed with precancerous cells about a week ago.

## 2011-12-21 NOTE — ED Notes (Signed)
Medicated for c/o h/a

## 2011-12-21 NOTE — Consult Note (Signed)
Referring Provider: No ref. provider found Primary Care Physician:  Josue Hector, MD, MD Primary Gastroenterologist:  Dr. Evette Cristal   Reason for Consultation:  Hematochezia, 10 days s/p polypectomies  HPI: Destiny Robinson is a 56 y.o. female who has a several week h/o low abd pn, for which she underwent inpatient colonoscopy by Dr. Dorena Cookey 10 days ago.  She had 7 proximal colonic polyps removed by snare cautery, as well as a somewhat larger polyp in the transverse colon which could not be removed at the time due to its unusual position.  She did well thereafter, including improvement in her abd pn over the past several days, until this morning when she began to have multiple episodes of hematochezia, associated with dizziness but no syncope.  She would have some low abd pn prior to her passage of blood, which would then be relieved by the hematochezia.  Her last episode of hematochezia was several hours ago, at which time the stool had converted from a fairly bright red color to a dark color.  Of note, the patient has been on a daily baby aspirin.   Past Medical History  Diagnosis Date  . Hypertension   . Diabetes mellitus   . Hypercholesterolemia   . Bronchitis   . Pneumonia   . Pancreatitis   . Asthma     "come and go"    Past Surgical History  Procedure Date  . Abdominal hysterectomy   . Cholecystectomy   . Tonsillectomy and adenoidectomy   . Cesarean section     X2   . Esophagogastroduodenoscopy 12/11/2011    Procedure: ESOPHAGOGASTRODUODENOSCOPY (EGD);  Surgeon: Barrie Folk, MD;  Location: [facility] ENDOSCOPY;  Service: Endoscopy;  Laterality: N/A;  . Colonoscopy 12/11/2011    Procedure: COLONOSCOPY;  Surgeon: Barrie Folk, MD;  Location: [facility] ENDOSCOPY;  Service: Endoscopy;  Laterality: N/A;    Prior to Admission medications   Medication Sig Start Date End Date Taking? Authorizing Provider  acyclovir cream (ZOVIRAX) 5 % Apply 1 application topically every 3 (three) hours. 12/13/11  12/12/12 Yes Sorin Luanne Bras, MD  amLODipine (NORVASC) 5 MG tablet Take 5 mg by mouth every evening.   Yes Historical Provider, MD  aspirin EC 81 MG tablet Take 81 mg by mouth daily.   Yes Historical Provider, MD  Cholecalciferol (VITAMIN D) 2000 UNITS tablet Take 2,000 Units by mouth daily.   Yes Historical Provider, MD  Choline Fenofibrate (TRILIPIX) 135 MG capsule Take 135 mg by mouth at bedtime.   Yes Historical Provider, MD  desvenlafaxine (PRISTIQ) 50 MG 24 hr tablet Take 50 mg by mouth daily.   Yes Historical Provider, MD  dicyclomine (BENTYL) 20 MG tablet Take 20 mg by mouth 3 (three) times daily.   Yes Historical Provider, MD  doxycycline (VIBRA-TABS) 100 MG tablet Take 100 mg by mouth 2 (two) times daily. Take for 10 days 12/15/11 12/25/11 Yes Historical Provider, MD  ezetimibe-simvastatin (VYTORIN) 10-40 MG per tablet Take 1 tablet by mouth at bedtime.   Yes Historical Provider, MD  fluconazole (DIFLUCAN) 100 MG tablet Take 100 mg by mouth every other day.   Yes Historical Provider, MD  glipiZIDE (GLUCOTROL XL) 5 MG 24 hr tablet Take 5 mg by mouth at bedtime.   Yes Historical Provider, MD  LORazepam (ATIVAN) 0.5 MG tablet Take 0.5 mg by mouth 3 (three) times daily as needed. FOR ANXIETY   Yes Historical Provider, MD  Lysine 500 MG CAPS Take 1 capsule by mouth every  morning.   Yes Historical Provider, MD  metFORMIN (GLUCOPHAGE) 500 MG tablet Take 1,000 mg by mouth 2 (two) times daily with a meal.   Yes Historical Provider, MD  Multiple Vitamin (MULITIVITAMIN WITH MINERALS) TABS Take 1 tablet by mouth daily.   Yes Historical Provider, MD  omeprazole (PRILOSEC) 40 MG capsule Take 40 mg by mouth daily.   Yes Historical Provider, MD  oxyCODONE-acetaminophen (PERCOCET) 5-325 MG per tablet Take 1 tablet by mouth every 4 (four) hours as needed for pain. 12/13/11 12/23/11 Yes Sorin Luanne Bras, MD  Pancrelipase, Lip-Prot-Amyl, (CREON PO) Take 1 capsule by mouth 3 (three) times daily with meals.   Yes  Historical Provider, MD  potassium chloride (K-DUR) 10 MEQ tablet Take 10 mEq by mouth every morning.   Yes Historical Provider, MD  sitaGLIPtin (JANUVIA) 100 MG tablet Take 100 mg by mouth daily.   Yes Historical Provider, MD  valsartan-hydrochlorothiazide (DIOVAN-HCT) 160-12.5 MG per tablet Take 1 tablet by mouth daily.   Yes Historical Provider, MD  vitamin C (ASCORBIC ACID) 500 MG tablet Take 500 mg by mouth daily.   Yes Historical Provider, MD    Current Facility-Administered Medications  Medication Dose Route Frequency Provider Last Rate Last Dose  . 0.9 %  sodium chloride infusion   Intravenous Continuous Edsel Petrin, DO 150 mL/hr at 12/21/11 1702 1,000 mL at 12/21/11 1702  . acetaminophen (TYLENOL) tablet 650 mg  650 mg Oral Q6H PRN Edsel Petrin, DO       Or  . acetaminophen (TYLENOL) suppository 650 mg  650 mg Rectal Q6H PRN Edsel Petrin, DO      . acetaminophen (TYLENOL) tablet 650 mg  650 mg Oral Once Flint Melter, MD   650 mg at 12/21/11 1505  . alum & mag hydroxide-simeth (MAALOX/MYLANTA) 200-200-20 MG/5ML suspension 30 mL  30 mL Oral Q6H PRN Edsel Petrin, DO      . amLODipine (NORVASC) tablet 5 mg  5 mg Oral QPM Edsel Petrin, DO      . cholecalciferol (VITAMIN D) tablet 2,000 Units  2,000 Units Oral Daily Edsel Petrin, DO   2,000 Units at 12/21/11 1813  . desvenlafaxine (PRISTIQ) 24 hr tablet 50 mg  50 mg Oral Daily Edsel Petrin, DO      . dicyclomine (BENTYL) tablet 20 mg  20 mg Oral TID Edsel Petrin, DO   20 mg at 12/21/11 1812  . doxycycline (VIBRA-TABS) tablet 100 mg  100 mg Oral Q12H Edsel Petrin, DO      . LORazepam (ATIVAN) tablet 0.5 mg  0.5 mg Oral TID PRN Edsel Petrin, DO      . metroNIDAZOLE (FLAGYL) tablet 500 mg  500 mg Oral Q12H Edsel Petrin, DO      . ondansetron [facility]) tablet 4 mg  4 mg Oral Q6H PRN Edsel Petrin, DO       Or  . ondansetron [facility]) injection 4 mg  4 mg  Intravenous Q6H PRN Edsel Petrin, DO      . oxyCODONE-acetaminophen (PERCOCET) 5-325 MG per tablet 1 tablet  1 tablet Oral Q4H PRN Edsel Petrin, DO      . pantoprazole (PROTONIX) EC tablet 40 mg  40 mg Oral Q1200 Edsel Petrin, DO      . simvastatin (ZOCOR) tablet 20 mg  20 mg Oral q1800 Edsel Petrin, DO   20 mg at 12/21/11 1812  . vitamin C (ASCORBIC  ACID) tablet 500 mg  500 mg Oral Daily Edsel Petrin, DO   500 mg at 12/21/11 1813  . zolpidem (AMBIEN) tablet 5 mg  5 mg Oral QHS PRN Edsel Petrin, DO      . DISCONTD: 0.9 %  sodium chloride infusion  1,000 mL Intravenous Continuous Flint Melter, MD 125 mL/hr at 12/21/11 0748 1,000 mL at 12/21/11 0748  . DISCONTD: 0.9 %  sodium chloride infusion   Intravenous STAT Flint Melter, MD      . DISCONTD: Vitamin D 2,000 Units  2,000 Units Oral Daily Edsel Petrin, DO        Allergies as of 12/21/2011 - Review Complete 12/21/2011  Allergen Reaction Noted  . Penicillins Anaphylaxis 11/29/2011  . Codeine Hypertension 11/29/2011  . Sulfa antibiotics Hives 12/10/2011    Family History  Problem Relation Age of Onset  . Colon cancer Sister     History   Social History  . Marital Status: Widowed    Spouse Name: N/A    Number of Children: N/A  . Years of Education: N/A   Occupational History  . Not on file.   Social History Main Topics  . Smoking status: Former Smoker -- 6 years    Types: Cigarettes    Quit date: 12/09/1996  . Smokeless tobacco: Never Used  . Alcohol Use: No  . Drug Use: No  . Sexually Active: No     complete hysterectomy   Other Topics Concern  . Not on file   Social History Narrative  . No narrative on file    Review of Systems: Positive per HPI  Physical Exam:      Pleasant, healthy-appearing female, NAD, VS ok, no pallor, skin warm and dry, radial pulse full. Chest clr, heart nl, abd has sparse bowel sds w/ diffuse moderate tympany, but no frank distension,  no guarding or tenderness, no organomegaly or masses.  Vital signs in last 24 hours: Temp:  [98.3 F (36.8 C)-98.5 F (36.9 C)] 98.5 F (36.9 C) (03/18 1548) Pulse Rate:  [75-107] 75  (03/18 1548) Resp:  [16-18] 18  (03/18 1548) BP: (98-125)/(60-86) 121/75 mmHg (03/18 1548) SpO2:  [93 %-98 %] 95 % (03/18 1548) Weight:  [85.4 kg (188 lb 4.4 oz)] 85.4 kg (188 lb 4.4 oz) (03/18 1548)    Lab Results:  Basename 12/21/11 1546 12/21/11 0800  WBC 5.6 6.2  HGB 10.9* 12.7  HCT 32.9* 38.5  PLT 146* 146*   BMET  Basename 12/21/11 0800  NA 142  K 3.7  CL 103  CO2 26  GLUCOSE 97  BUN 25*  CREATININE 0.53  CALCIUM 9.4   LFT No results found for this basename: PROT,ALBUMIN,AST,ALT,ALKPHOS,BILITOT,BILIDIR,IBILI in the last 72 hours PT/INR No results found for this basename: LABPROT:2,INR:2 in the last 72 hours    Studies/Results: No results found.  Impression: The overall picture is very c/w a post-polypectomy bleed, especially considering the number, size and location of the polyps removed.  Her daily use of aspirin might have increased her risk of hemorrhage, although this is controversial.  Post-polypectomy bleeding can occur any time up to 2 weeks post procedure.  Plan: It appears that the patient's bleeding is slowing down, as reflected both by the decreased frequency, and the darker color, of her BM's.   She is hemodynamically stable, and her Hgb is acceptable.  Therefore, I would favor observation and clinical monitoring (VS, bld count, stool frequency and appearance).   If there  is evidence of accelerated bleeding, consider bleeding scan, and/or attempts at prep and colonoscopy to possibly find and treat the bleeding spot.   LOS: 0 days   Jesiah Yerby V  12/21/2011, 6:20 PM

## 2011-12-21 NOTE — ED Notes (Addendum)
Had colonoscopy3-8-13 & 7 polyps removed. C/o lower abd pain & Approx 8 bloody stools since 0300. Reports bright blood with clots. Denies n/v

## 2011-12-21 NOTE — ED Notes (Signed)
Report received, assumed care.  

## 2011-12-21 NOTE — ED Notes (Signed)
Vital signs stable. Has had total of 3 episodes of small bloody stools

## 2011-12-21 NOTE — ED Notes (Signed)
Diet tray ordered per Dr. Effie Shy order

## 2011-12-21 NOTE — ED Notes (Signed)
Pt given a cup of ice, ok'd by Effie Shy, MD

## 2011-12-21 NOTE — ED Notes (Signed)
Pt inquiring about the next step of her care; informed Effie Shy, MD and delayed explained to pt per MD

## 2011-12-21 NOTE — ED Notes (Signed)
Family at bedside. 

## 2011-12-21 NOTE — ED Notes (Signed)
Pt placed back on continuous pulse oximetry and blood pressure cuff; family at bedside 

## 2011-12-21 NOTE — ED Notes (Signed)
  States she was discharged from hospital 3/10 had colonscopy. States she had several poylps removed . Noticed streaks of blood yest. States she has been going to the bathroom every 30 mins and passing nothing but bright red blood.

## 2011-12-21 NOTE — ED Provider Notes (Signed)
History     CSN: [id number]  Arrival date & time 12/21/11  4098   First MD Initiated Contact with Patient 12/21/11 [phone]      Chief Complaint  Patient presents with  . Rectal Bleeding    (Consider location/radiation/quality/duration/timing/severity/associated sxs/prior treatment) HPI Comments: Patient is a 56 y.o. female presenting with hematochezia. The history is provided by the patient and a relative.  Rectal Bleeding  The current episode started today. The problem occurs frequently. The problem has been unchanged. There was no prior successful therapy. There was no prior unsuccessful therapy. Associated symptoms include abdominal pain and nausea. Pertinent negatives include no anorexia, no fever, no hematuria, no chest pain, no headaches and no difficulty breathing. She has been behaving normally. She has been eating and drinking normally. There were no sick contacts. Recently, medical care has been given by a specialist.   She had endoscopy 10 days ago with removal of several polyps that are stated to be benign. She has had subacute, abdominal pain for several weeks with diarrhea, nausea and vomiting. She's had several episodes of bright red per rectum, this morning. Her pain migrated from the upper abdomen to the lower abdomen, 3 days ago. Now, it is crampy in nature. Her GI specialist put her on Bentyl last week.      Past Medical History  Diagnosis Date  . Hypertension   . Diabetes mellitus   . Hypercholesterolemia   . Bronchitis   . Pneumonia   . Pancreatitis   . Asthma     "come and go"    Past Surgical History  Procedure Date  . Abdominal hysterectomy   . Cholecystectomy   . Tonsillectomy and adenoidectomy   . Cesarean section     X2   . Esophagogastroduodenoscopy 12/11/2011    Procedure: ESOPHAGOGASTRODUODENOSCOPY (EGD);  Surgeon: Barrie Folk, MD;  Location: [facility] ENDOSCOPY;  Service: Endoscopy;  Laterality: N/A;  . Colonoscopy 12/11/2011    Procedure: COLONOSCOPY;   Surgeon: Barrie Folk, MD;  Location: [facility] ENDOSCOPY;  Service: Endoscopy;  Laterality: N/A;    Family History  Problem Relation Age of Onset  . Colon cancer Sister     History  Substance Use Topics  . Smoking status: Former Smoker -- 6 years    Types: Cigarettes    Quit date: 12/09/1996  . Smokeless tobacco: Never Used  . Alcohol Use: No    OB History    Grav Para Term Preterm Abortions TAB SAB Ect Mult Living                  Review of Systems  Allergies  Penicillins; Codeine; and Sulfa antibiotics  Home Medications   Current Outpatient Rx  Name Route Sig Dispense Refill  . ACYCLOVIR 5 % EX CREA Topical Apply 1 application topically every 3 (three) hours. 15 g   . AMLODIPINE BESYLATE 5 MG PO TABS Oral Take 5 mg by mouth every evening.    . ASPIRIN EC 81 MG PO TBEC Oral Take 81 mg by mouth daily.    Marland Kitchen VITAMIN D 2000 UNITS PO TABS Oral Take 2,000 Units by mouth daily.    . CHOLINE FENOFIBRATE 135 MG PO CPDR Oral Take 135 mg by mouth at bedtime.    . DESVENLAFAXINE SUCCINATE ER 50 MG PO TB24 Oral Take 50 mg by mouth daily.    Marland Kitchen DICYCLOMINE HCL 20 MG PO TABS Oral Take 20 mg by mouth 3 (three) times daily.    Marland Kitchen DOXYCYCLINE  HYCLATE 100 MG PO TABS Oral Take 100 mg by mouth 2 (two) times daily. Take for 10 days    . EZETIMIBE-SIMVASTATIN 10-40 MG PO TABS Oral Take 1 tablet by mouth at bedtime.    Marland Kitchen FLUCONAZOLE 100 MG PO TABS Oral Take 100 mg by mouth every other day.    Marland Kitchen GLIPIZIDE ER 5 MG PO TB24 Oral Take 5 mg by mouth at bedtime.    Marland Kitchen LORAZEPAM 0.5 MG PO TABS Oral Take 0.5 mg by mouth 3 (three) times daily as needed. FOR ANXIETY    . LYSINE 500 MG PO CAPS Oral Take 1 capsule by mouth every morning.    Marland Kitchen METFORMIN HCL 500 MG PO TABS Oral Take 1,000 mg by mouth 2 (two) times daily with a meal.    . ADULT MULTIVITAMIN W/MINERALS CH Oral Take 1 tablet by mouth daily.    Marland Kitchen OMEPRAZOLE 40 MG PO CPDR Oral Take 40 mg by mouth daily.    . OXYCODONE-ACETAMINOPHEN 5-325 MG PO TABS Oral  Take 1 tablet by mouth every 4 (four) hours as needed for pain. 30 tablet 0  . CREON PO Oral Take 1 capsule by mouth 3 (three) times daily with meals.    Marland Kitchen POTASSIUM CHLORIDE ER 10 MEQ PO TBCR Oral Take 10 mEq by mouth every morning.    Marland Kitchen SITAGLIPTIN PHOSPHATE 100 MG PO TABS Oral Take 100 mg by mouth daily.    Marland Kitchen VALSARTAN-HYDROCHLOROTHIAZIDE 160-12.5 MG PO TABS Oral Take 1 tablet by mouth daily.    Marland Kitchen VITAMIN C 500 MG PO TABS Oral Take 500 mg by mouth daily.      BP 114/66  Pulse 90  Temp(Src) 98.3 F (36.8 C) (Oral)  Resp 16  SpO2 96%  Physical Exam  Nursing note and vitals reviewed. Constitutional: She is oriented to person, place, and time. She appears well-developed and well-nourished.  HENT:  Head: Normocephalic and atraumatic.  Eyes: Conjunctivae and EOM are normal. Pupils are equal, round, and reactive to light.  Neck: Normal range of motion and phonation normal. Neck supple.  Cardiovascular: Normal rate, regular rhythm and intact distal pulses.   Pulmonary/Chest: Effort normal and breath sounds normal. She exhibits no tenderness.  Abdominal: Soft. She exhibits no distension. There is no tenderness. There is no guarding.  Musculoskeletal: Normal range of motion.  Neurological: She is alert and oriented to person, place, and time. She has normal strength. She exhibits normal muscle tone.  Skin: Skin is warm and dry.  Psychiatric: She has a normal mood and affect. Her behavior is normal. Judgment and thought content normal.    ED Course  Procedures (including critical care time)   11:14 AM Reevaluation with update and discussion. After initial assessment and treatment, an updated evaluation reveals she has had one additional episode of bleeding in the emergency department that she describes as mild. Venecia Mehl L   11:10- consultation with her GI service- Dr Matthias Hughs, recommend admit as observation, to allow the process to declare itself. He will arrange for her to be seen  as a consult.   Labs Reviewed  CBC - Abnormal; Notable for the following:    Platelets 146 (*)    All other components within normal limits  BASIC METABOLIC PANEL - Abnormal; Notable for the following:    BUN 25 (*)    All other components within normal limits  DIFFERENTIAL   No results found.   1. Rectal bleed       MDM  Post polypectomy, rectal bleeding.  Stable in , requiring emergency requiring admission for observation. Department.    Plan: Admit- admit to hospitalist service    Flint Melter, MD 12/21/11 1115

## 2011-12-21 NOTE — ED Notes (Signed)
Pt and family demanded a bedside commode. Pt also reports having just had a bloody stool

## 2011-12-21 NOTE — H&P (Signed)
Hospital Admission Note Date: 12/21/2011  Patient name: Destiny Robinson Medical record number: [id number] Date of birth: Feb 19, 1956 Age: 56 y.o. Gender: female PCP: Josue Hector, MD, MD  Medical Service: [facility] 8 Attending physician: Aileen Fass     Chief Complaint:  Bloody stools, abdominal pain  History of Present Illness: This is a 56 year old woman with a medical history significant for type 2 diabetes, hyperlipidemia, hypertension and depression who has been dealing with abdominal pain of unknown etiology since November 29, 2011 when she had an initial emergency room evaluation for abdominal pain. At that time she was found to have an elevated lipase and was given a diagnosis of pancreatitis she was treated conservatively with IV fluids and pain medications she does not have a gallbladder so the etiology was undetermined she does not drink alcohol. CT scan did imaging at that time were negative. She followed up as an outpatient with gastroenterology but returned to the emergency department on March 6 where she was admitted for refractory abdominal pain with multiple episodes of nausea vomiting and diarrhea. She underwent a colonoscopy 10 days ago and was discharged home. She now comes back to the emergency department complaining of multiple bloody stools. She had 7 polyps removed during the colonoscopy with snare cautery. She remained on 81 mg baby aspirin. Bloody stools began this morning, he does not have any dizziness nausea or vomiting, he does complain of persistent diarrhea has been ongoing for the past few weeks. Incidentally she tells me that she was recently put on an antibiotic regimen for a presumed MRSA infection by her primary care provider she injured her toe while in the hospital or in the last admission and thinks that she got MRSA infection however the infection is in her left upper arm and looks like a subcutaneous nodule is mildly inflamed.  She denies any chest pain,  nausea vomiting. She denies any fevers chills. She says for the past month she has only been on clear liquids and every time she tries to eat "real food"  She gets sick and abdominal pain diarrhea recur minutes after eating  Meds: Medications Prior to Admission  Medication Dose Route Frequency Provider Last Rate Last Dose  . 0.9 %  sodium chloride infusion   Intravenous Continuous Edsel Petrin, DO 150 mL/hr at 12/21/11 1702 1,000 mL at 12/21/11 1702  . acetaminophen (TYLENOL) tablet 650 mg  650 mg Oral Q6H PRN Edsel Petrin, DO       Or  . acetaminophen (TYLENOL) suppository 650 mg  650 mg Rectal Q6H PRN Edsel Petrin, DO      . acetaminophen (TYLENOL) tablet 650 mg  650 mg Oral Once Flint Melter, MD   650 mg at 12/21/11 1505  . alum & mag hydroxide-simeth (MAALOX/MYLANTA) 200-200-20 MG/5ML suspension 30 mL  30 mL Oral Q6H PRN Edsel Petrin, DO      . amLODipine (NORVASC) tablet 5 mg  5 mg Oral QPM Edsel Petrin, DO   5 mg at 12/21/11 2115  . cholecalciferol (VITAMIN D) tablet 2,000 Units  2,000 Units Oral Daily Edsel Petrin, DO   2,000 Units at 12/21/11 1813  . dicyclomine (BENTYL) tablet 20 mg  20 mg Oral TID Edsel Petrin, DO   20 mg at 12/21/11 2115  . doxycycline (VIBRA-TABS) tablet 100 mg  100 mg Oral Q12H Edsel Petrin, DO   100 mg at 12/21/11 2115  . insulin aspart (novoLOG) injection 0-15 Units  0-15 Units Subcutaneous TID WC Edsel Petrin, DO      . linagliptin (TRADJENTA) tablet 5 mg  5 mg Oral Daily Edsel Petrin, DO   5 mg at 12/21/11 2115  . LORazepam (ATIVAN) tablet 0.5 mg  0.5 mg Oral TID PRN Edsel Petrin, DO   0.5 mg at 12/21/11 2114  . metroNIDAZOLE (FLAGYL) tablet 500 mg  500 mg Oral Q12H Edsel Petrin, DO   500 mg at 12/21/11 2115  . ondansetron (ZOFRAN) tablet 4 mg  4 mg Oral Q6H PRN Edsel Petrin, DO       Or  . ondansetron [facility]) injection 4 mg  4 mg Intravenous Q6H PRN Edsel Petrin, DO      . oxyCODONE-acetaminophen (PERCOCET) 5-325 MG per tablet 1 tablet  1 tablet Oral Q4H PRN Edsel Petrin, DO      . pantoprazole (PROTONIX) EC tablet 40 mg  40 mg Oral Q1200 Edsel Petrin, DO      . simvastatin (ZOCOR) tablet 20 mg  20 mg Oral q1800 Edsel Petrin, DO   20 mg at 12/21/11 1812  . vitamin C (ASCORBIC ACID) tablet 500 mg  500 mg Oral Daily Edsel Petrin, DO   500 mg at 12/21/11 1813  . zolpidem (AMBIEN) tablet 5 mg  5 mg Oral QHS PRN Edsel Petrin, DO      . DISCONTD: 0.9 %  sodium chloride infusion  1,000 mL Intravenous Continuous Flint Melter, MD 125 mL/hr at 12/21/11 0748 1,000 mL at 12/21/11 0748  . DISCONTD: 0.9 %  sodium chloride infusion   Intravenous STAT Flint Melter, MD      . DISCONTD: desvenlafaxine (PRISTIQ) 24 hr tablet 50 mg  50 mg Oral Daily Edsel Petrin, DO      . DISCONTD: desvenlafaxine (PRISTIQ) 24 hr tablet 50 mg  50 mg Oral Daily Edsel Petrin, DO      . DISCONTD: Vitamin D 2,000 Units  2,000 Units Oral Daily Edsel Petrin, DO       Medications Prior to Admission  Medication Sig Dispense Refill  . acyclovir cream (ZOVIRAX) 5 % Apply 1 application topically every 3 (three) hours.  15 g    . amLODipine (NORVASC) 5 MG tablet Take 5 mg by mouth every evening.      Marland Kitchen aspirin EC 81 MG tablet Take 81 mg by mouth daily.      . Cholecalciferol (VITAMIN D) 2000 UNITS tablet Take 2,000 Units by mouth daily.      . Choline Fenofibrate (TRILIPIX) 135 MG capsule Take 135 mg by mouth at bedtime.      Marland Kitchen desvenlafaxine (PRISTIQ) 50 MG 24 hr tablet Take 50 mg by mouth daily.      Marland Kitchen doxycycline (VIBRA-TABS) 100 MG tablet Take 100 mg by mouth 2 (two) times daily. Take for 10 days      . ezetimibe-simvastatin (VYTORIN) 10-40 MG per tablet Take 1 tablet by mouth at bedtime.      . fluconazole (DIFLUCAN) 100 MG tablet Take 100 mg by mouth every other day.      Marland Kitchen glipiZIDE (GLUCOTROL XL) 5 MG 24 hr tablet Take 5  mg by mouth at bedtime.      Marland Kitchen LORazepam (ATIVAN) 0.5 MG tablet Take 0.5 mg by mouth 3 (three) times daily as needed. FOR ANXIETY      . Lysine 500 MG CAPS Take 1 capsule by mouth every  morning.      . metFORMIN (GLUCOPHAGE) 500 MG tablet Take 1,000 mg by mouth 2 (two) times daily with a meal.      . Multiple Vitamin (MULITIVITAMIN WITH MINERALS) TABS Take 1 tablet by mouth daily.      Marland Kitchen omeprazole (PRILOSEC) 40 MG capsule Take 40 mg by mouth daily.      Marland Kitchen oxyCODONE-acetaminophen (PERCOCET) 5-325 MG per tablet Take 1 tablet by mouth every 4 (four) hours as needed for pain.  30 tablet  0  . Pancrelipase, Lip-Prot-Amyl, (CREON PO) Take 1 capsule by mouth 3 (three) times daily with meals.      . potassium chloride (K-DUR) 10 MEQ tablet Take 10 mEq by mouth every morning.      . sitaGLIPtin (JANUVIA) 100 MG tablet Take 100 mg by mouth daily.      . valsartan-hydrochlorothiazide (DIOVAN-HCT) 160-12.5 MG per tablet Take 1 tablet by mouth daily.      . vitamin C (ASCORBIC ACID) 500 MG tablet Take 500 mg by mouth daily.        Allergies: Codeine; Penicillins; and Sulfa antibiotics Past Medical History  Diagnosis Date  . Hypertension   . Hypercholesterolemia   . Bronchitis   . Pancreatitis   . Complication of anesthesia 12/11/11    "angry, mean, hateful after" endoscopy & colonoscopy  . Asthma ~ 1991    "come and go"  . Pneumonia 06/2011; 07/2011  . Diabetes mellitus type 2, controlled   . GERD (gastroesophageal reflux disease)   . GI bleeding 12/21/11    ""that's why I'm here"  . Hx of colonoscopy with polypectomy 12/11/11    "took out 7; couldn't get #8, that one was precancerous"  . Depression   . Cancer     "female parts; they got it all when I had hysterectomy"   Past Surgical History  Procedure Date  . Cesarean section 1981; 1986  . Esophagogastroduodenoscopy 12/11/2011    Procedure: ESOPHAGOGASTRODUODENOSCOPY (EGD);  Surgeon: Barrie Folk, MD;  Location: [facility] ENDOSCOPY;  Service:  Endoscopy;  Laterality: N/A;  . Colonoscopy 12/11/2011    Procedure: COLONOSCOPY;  Surgeon: Barrie Folk, MD;  Location: [facility] ENDOSCOPY;  Service: Endoscopy;  Laterality: N/A;  . Abdominal hysterectomy 2002  . Cholecystectomy ~ 2002  . Tonsillectomy and adenoidectomy 1988  . Appendectomy ~ 2002    "w/hysterectomy"   Family History  Problem Relation Age of Onset  . Colon cancer Sister    History   Social History  . Marital Status: Widowed    Spouse Name: N/A    Number of Children: N/A  . Years of Education: N/A   Occupational History  . Not on file.   Social History Main Topics  . Smoking status: Former Smoker -- 0.1 packs/day for 6 years    Types: Cigarettes    Quit date: 12/09/1996  . Smokeless tobacco: Never Used  . Alcohol Use: No  . Drug Use: No  . Sexually Active: No     complete hysterectomy   Other Topics Concern  . Not on file   Social History Narrative  . No narrative on file    Review of Systems: Pertinent items are noted in HPI.  Physical Exam: Blood pressure 123/74, pulse 75, temperature 97 F (36.1 C), temperature source Oral, resp. rate 18, weight 85.4 kg (188 lb 4.4 oz), SpO2 91.00%. BP 123/74  Pulse 75  Temp(Src) 97 F (36.1 C) (Oral)  Resp 18  Wt 85.4 kg (188 lb  4.4 oz)  SpO2 91%  General Appearance:    Alert, cooperative, no distress, appears stated age  Head:    Normocephalic, without obvious abnormality, atraumatic  Eyes:    PERRL, conjunctiva/corneas clear, EOM's intact,   Ears:    Normal external ear canals, both ears  Nose:   Nares normal, septum midline, mucosa normal, no drainage    or sinus tenderness  Throat:   Lips, mucosa, and tongue normal; teeth and gums normal  Neck:   Supple, symmetrical, trachea midline, no adenopathy;    thyroid:  no enlargement/tenderness/nodules; no carotid   bruit or JVD  Back:     Symmetric, no curvature, ROM normal, no CVA tenderness  Lungs:     Clear to auscultation bilaterally, respirations  unlabored  Chest Wall:    No tenderness or deformity   Heart:    Regular rate and rhythm, S1 and S2 normal, no murmur, rub   or gallop  Breast Exam:    No tenderness, masses, or nipple abnormality  Abdomen:     Soft, non-tender, bowel sounds active all four quadrants,    no masses, no organomegaly        Extremities:   Upper left arm small hard subq nodule, mildly inflammed  Pulses:   2+ and symmetric all extremities  Skin:   Skin color, texture, turgor normal, no rashes or lesions  Lymph nodes:   Cervical, supraclavicular, and axillary nodes normal  Neurologic:   CNII-XII intact, normal strength, sensation and reflexes    throughout    Lab results: Basic Metabolic Panel:  Basename 12/21/11 1804 12/21/11 0800  NA -- 142  K -- 3.7  CL -- 103  CO2 -- 26  GLUCOSE -- 97  BUN -- 25*  CREATININE -- 0.53  CALCIUM -- 9.4  MG 1.8 --  PHOS -- --   Liver Function Tests:  [facility] 12/21/11 1804  AST 23  ALT 15  ALKPHOS 35*  BILITOT 0.2*  PROT 6.3  ALBUMIN 3.6    Basename 12/21/11 1804  LIPASE 89*  AMYLASE --   CBC:  Basename 12/21/11 1804 12/21/11 1546 12/21/11 0800  WBC -- 5.6 6.2  NEUTROABS -- -- 3.3  HGB 10.7* 10.9* --  HCT 32.4* 32.9* --  MCV -- 84.1 83.9  PLT -- 146* 146*    Basename 12/21/11 2056 12/21/11 1826  GLUCAP 116* 89   Fasting Lipid Panel:  Basename 12/21/11 1804  CHOL 101  HDL 28*  LDLCALC 37  TRIG 161*  CHOLHDL 3.6  LDLDIRECT --    Basename 12/21/11 1804  LABPROT 13.0  INR 0.96   Urinalysis:  Basename 12/21/11 1948  COLORURINE YELLOW  LABSPEC 1.005  PHURINE 7.0  GLUCOSEU NEGATIVE  HGBUR NEGATIVE  BILIRUBINUR NEGATIVE  KETONESUR NEGATIVE  PROTEINUR NEGATIVE  UROBILINOGEN 0.2  NITRITE NEGATIVE  LEUKOCYTESUR TRACE*   Imaging results:  No results found.  Other results: EKG: normal EKG, normal sinus rhythm, unchanged from previous tracings, normal sinus rhythm.  Assessment & Plan by Problem: 1. GIB Likely secondary to  being on aspirin therapy and recent polypectomies.  -Will follow Hb, observation -Appreciate GI assistance -ASA stoped  2. Abdominal Pain Now consider chronic abdominal pain without a clear etiology she did have an elevated lipase in February therefore elected to check a lipase again today and it is mildly elevated cannot determine if there has been a significant workup for a reason for her pancreatitis-since she doesn't have and gallbladder and she doesn't drink alcohol  I am presuming that this is iatrogenic or medication induced -also in the setting of a normal CT scan. She was on a very aggressive regimen of a cholesterol drugs and diabetes drugs that I suspect may be culprits in her pancreatitis and possibly contributing to her diarrhea and abdominal pain-notably her lipid panel was very unusual with a very low LDL cholesterol and HDL cholesterol she was on a fibrate as well as a Vytorin- I have stopped both of these recommend that she not resume these until she has a discussion with her regular physician- metformin and Januvia have also been implicated in causing pancreatitis. My opinion she needs a complete outpatient medication overhaul. Urinalysis was negative. -Lipase is elevated, IV fluid, and diet as tolerate -Stop Januvia, Metformin,  Fenofibrate. Vytorin -Continue PPI  3. Type 2 diabetes The patient insisted that she continue on her Januvia I have advised against this since she has elevated lipase. She needs to be on insulin but has been very resistant to this in the outpatient setting, her A1C is pending.  4. Skin/ Soft tissue infection Subq/follicle nodule in left upper arm inflammed, was started on Doxy a few days ago for MRSA infection -Will resume Doxy and add on Flagyl for possible anerobic coverage/colitis until c. Diff PCR comes back  5. Dispo: Pending work-up completion, home when medically stable  Signed: Christapher Gillian 12/21/2011, 10:28 PM

## 2011-12-22 ENCOUNTER — Observation Stay (HOSPITAL_COMMUNITY): Payer: Self-pay

## 2011-12-22 LAB — BASIC METABOLIC PANEL
Chloride: 108 mEq/L (ref 96–112)
Creatinine, Ser: 0.53 mg/dL (ref 0.50–1.10)
GFR calc Af Amer: >90 mL/min (ref >90)
Potassium: 4.1 mEq/L (ref 3.5–5.1)

## 2011-12-22 LAB — HEMOGLOBIN AND HEMATOCRIT, BLOOD
HCT: 28.7 % — ABNORMAL LOW (ref 36.0–46.0)
HCT: 30.1 % — ABNORMAL LOW (ref 36.0–46.0)
HCT: 32.4 % — ABNORMAL LOW (ref 36.0–46.0)
Hemoglobin: 9.6 g/dL — ABNORMAL LOW (ref 12.0–15.0)

## 2011-12-22 LAB — HEMOGLOBIN A1C: Mean Plasma Glucose: 131 mg/dL — ABNORMAL HIGH (ref <117)

## 2011-12-22 LAB — GLUCOSE, CAPILLARY: Glucose-Capillary: 81 mg/dL (ref 70–99)

## 2011-12-22 MED ORDER — POLYETHYLENE GLYCOL 3350 17 G PO PACK
17.0000 g | PACK | Freq: Two times a day (BID) | ORAL | Status: DC
  Administered 2011-12-22 – 2011-12-24 (×4): 17 g via ORAL
  Filled 2011-12-22 (×5): qty 1

## 2011-12-22 MED ORDER — DESVENLAFAXINE SUCCINATE ER 50 MG PO TB24
50.0000 mg | ORAL_TABLET | Freq: Every day | ORAL | Status: DC
  Administered 2011-12-22 – 2011-12-24 (×3): 50 mg via ORAL
  Filled 2011-12-22 (×5): qty 1

## 2011-12-22 NOTE — Progress Notes (Signed)
3 Large bloody stools noted from pt. Recent HG 9.9 Hct 30.1 BP 140/83. Per MD Bucini previous note, notified MD oncall Magodi of increasing bloody stools, red/frank blood in stools and frequency increasing at this time. Will continue plan and redraw Hgb/Hct @ 0600. Will call MD if Hgb less than 8.

## 2011-12-22 NOTE — Progress Notes (Signed)
Pt noted 8th bloody stool on shift, awaiting am Hgb level. MD aware of stools and frequency, will call with results.

## 2011-12-22 NOTE — Progress Notes (Signed)
GASTROENTEROLOGY PROGRESS NOTE  Problem:   Hematochezia, presumed post-polypectomy hemorrhage  Subjective: The patient reports a large bloody bowel movement a short while ago. It had been probably about 6 or 8 hours since her last bloody bowel movement. She does report some nausea.  Objective: Hemoglobin is rising steadily, from 9.9 yesterday to 10.2 this morning to 10.6 this afternoon. BUN normal.  Bleeding scan negative.  Vital signs normal. No tachycardia or hypotension. Skin is warm and dry. Radial pulse is full. Abdomen nontender.  Assessment: Quiescent GI bleed Moderate posthemorrhagic anemia  Plan: Continue observation. Advance diet. Hold off on colonoscopy in view of negative bleeding scan and rising hemoglobin. Gentle laxation with MiraLAX to help push blood out of the colon.  Florencia Reasons, M.D. 12/22/2011 5:39 PM

## 2011-12-22 NOTE — Progress Notes (Addendum)
1027- Patient complains of having 9 bright red bloody stools last night, night RN confirmed.  This am patient had moderate amount of bright red blood in toilet.  MD paged and notified.  Patient to remain on med-surg unit off tele for now.  Per patient, patient has only taken in clear liquids since midnight last night.  Placing patient on clear liquid diet this am.  12/22/2011 Morley Kos

## 2011-12-22 NOTE — Progress Notes (Signed)
21- RN to bedside after patient called for medication.  Patient continued to be agitated, shouting at nurse.  Dinner tray brought in, patient slamming tea down, shouting at dietary, pounding her fists on herself and the bedside table, shouting in RNs face.  Sister shouting at [employer] as well.  Patient now refusing medications.  Security notified.   Morley Kos 12/22/2011

## 2011-12-22 NOTE — Progress Notes (Signed)
INITIAL ADULT NUTRITION ASSESSMENT Date: 12/22/2011   Time: 2:37 PM Reason for Assessment: Screened for nutrition risk, unintentional weight loss  ASSESSMENT: Female 56 y.o.  Dx: Abdominal pain  Hx:  Past Medical History  Diagnosis Date  . Hypertension   . Hypercholesterolemia   . Bronchitis   . Pancreatitis   . Complication of anesthesia 12/11/11    "angry, mean, hateful after" endoscopy & colonoscopy  . Asthma ~ 1991    "come and go"  . Pneumonia 06/2011; 07/2011  . Diabetes mellitus type 2, controlled   . GERD (gastroesophageal reflux disease)   . GI bleeding 12/21/11    ""that's why I'm here"  . Hx of colonoscopy with polypectomy 12/11/11    "took out 7; couldn't get #8, that one was precancerous"  . Depression   . Cancer     "female parts; they got it all when I had hysterectomy"   Related Meds:  Scheduled Meds:   . acetaminophen  650 mg Oral Once  . amLODipine  5 mg Oral QPM  . cholecalciferol  2,000 Units Oral Daily  . desvenlafaxine  50 mg Oral Daily  . dicyclomine  20 mg Oral TID  . doxycycline  100 mg Oral Q12H  . insulin aspart  0-15 Units Subcutaneous TID WC  . metroNIDAZOLE  500 mg Oral Q12H  . pantoprazole  40 mg Oral Q1200  . vitamin C  500 mg Oral Daily  . DISCONTD: sodium chloride   Intravenous STAT  . DISCONTD: desvenlafaxine  50 mg Oral Daily  . DISCONTD: desvenlafaxine  50 mg Oral Daily  . DISCONTD: linagliptin  5 mg Oral Daily  . DISCONTD: simvastatin  20 mg Oral q1800  . DISCONTD: Vitamin D  2,000 Units Oral Daily   Continuous Infusions:   . sodium chloride 150 mL/hr at 12/22/11 0500  . DISCONTD: sodium chloride 1,000 mL (12/21/11 0748)   PRN Meds:.acetaminophen, acetaminophen, alum & mag hydroxide-simeth, LORazepam, ondansetron (ZOFRAN) IV, ondansetron, oxyCODONE-acetaminophen, zolpidem  Ht:  5'2" per patient  Wt: 188 lb 4.4 oz (85.4 kg)   Ideal Wt:   50 kg % Ideal Wt: 170.9%  Usual Wt: 200 lb. % Usual Wt: 94%  BMI: 34.38 kg/m^2     (Class I obesity)  Food/Nutrition Related Hx: Patient reports unintentional weight loss since November 29, 2011. Unintentional weight loss of 12 pounds over the past month from UBW of 200 lb..  Patient reports has been on clear liquid diet per MD at home due to not tolerating solid foods. Patient stated follows diabetic diet. Patient currently NPO, prior to NPO status patient with varied PO intake at meals documented between 15-100%.   Labs:  CMP     Component Value Date/Time   NA 145 12/22/2011 0645   K 4.1 12/22/2011 0645   CL 108 12/22/2011 0645   CO2 29 12/22/2011 0645   GLUCOSE 122* 12/22/2011 0645   BUN 14 12/22/2011 0645   CREATININE 0.53 12/22/2011 0645   CALCIUM 8.9 12/22/2011 0645   PROT 6.3 12/21/2011 1804   ALBUMIN 3.6 12/21/2011 1804   AST 23 12/21/2011 1804   ALT 15 12/21/2011 1804   ALKPHOS 35* 12/21/2011 1804   BILITOT 0.2* 12/21/2011 1804   GFRNONAA >90 12/22/2011 0645   GFRAA >90 12/22/2011 0645    Intake/Output Summary (Last 24 hours) at 12/22/11 1438 Last data filed at 12/22/11 0600  Gross per 24 hour  Intake   2850 ml  Output      0  ml  Net   2850 ml  *fluid retention noted  Diet Order: NPO  Supplements/Tube Feeding: none at this time  IVF:    sodium chloride Last Rate: 150 mL/hr at 12/22/11 0500  DISCONTD: sodium chloride Last Rate: 1,000 mL (12/21/11 0748)    Estimated Nutritional Needs:   Kcal: 1610-9604 Protein: 94-111 grams Fluid: 1 ml per kcal  NUTRITION DIAGNOSIS: -Inadequate oral intake (NI-2.1).  Status: Ongoing  RELATED TO: inability to eat  AS EVIDENCE BY: clear liquid diet restriction  MONITORING/EVALUATION(Goals): Labs, PO intake, Weight trends, Diet advancement 1. Meet > 90% of estimated nutrition needs 2. PO intake >75 % at meals 3. Promote weight maintenance 4. Diet advancement  EDUCATION NEEDS: -No education needs identified at this time  INTERVENTION: 1. Patient refuses clear liquid diet nutrition supplement Resource  Breeze. 2. Recommend order patient Glucerna BID when medically able/ diet is advanced. Provides 440 kcal and 20 grams protein.  3. RD to follow for nutrition needs.   Dietitian [phone]  DOCUMENTATION CODES Per approved criteria  -Obesity Unspecified    Iven Finn [facility] 12/22/2011, 2:37 PM

## 2011-12-22 NOTE — Progress Notes (Signed)
Utilization review complete 

## 2011-12-22 NOTE — Progress Notes (Signed)
Subjective: Reported 9-10 bloody stools, Hb has been stable, BP is in good range, patient very agitated about situation and that no one is "doing anything", "feels chilled".  Objective: Vital signs in last 24 hours: Temp:  [96.7 F (35.9 C)-97.8 F (36.6 C)] 96.7 F (35.9 C) (03/19 2210) Pulse Rate:  [80-84] 82  (03/19 2210) Resp:  [18] 18  (03/19 2210) BP: (117-140)/(66-85) 126/85 mmHg (03/19 2210) SpO2:  [95 %-97 %] 97 % (03/19 2210) Weight change:  Last BM Date: 12/21/11  Intake/Output from previous day: 03/18 0701 - 03/19 0700 In: 2850 [P.O.:1200; I.V.:1650] Out: -    Physical Exam: General: Alert, awake, oriented x3, in no acute distress. HEENT: No bruits, no goiter. Heart: Regular rate and rhythm, without murmurs, rubs, gallops. Lungs: Clear to auscultation bilaterally. Abdomen: Soft, nontender, nondistended, positive bowel sounds. Extremities: No clubbing cyanosis or edema with positive pedal pulses. Neuro: Grossly intact, nonfocal.  Lab Results: Basic Metabolic Panel:  Basename 12/22/11 0645 12/21/11 1804 12/21/11 0800  NA 145 -- 142  K 4.1 -- 3.7  CL 108 -- 103  CO2 29 -- 26  GLUCOSE 122* -- 97  BUN 14 -- 25*  CREATININE 0.53 -- 0.53  CALCIUM 8.9 -- 9.4  MG -- 1.8 --  PHOS -- -- --   Liver Function Tests:  [facility] 12/21/11 1804  AST 23  ALT 15  ALKPHOS 35*  BILITOT 0.2*  PROT 6.3  ALBUMIN 3.6    Basename 12/21/11 1804  LIPASE 89*  AMYLASE --   No results found for this basename: AMMONIA:2 in the last 72 hours CBC:  Basename 12/22/11 2104 12/22/11 1339 12/21/11 1546 12/21/11 0800  WBC -- -- 5.6 6.2  NEUTROABS -- -- -- 3.3  HGB 9.6* 10.6* -- --  HCT 28.7* 32.4* -- --  MCV -- -- 84.1 83.9  PLT -- -- 146* 146*   CBG:  Basename 12/22/11 2216 12/22/11 1718 12/22/11 1358 12/22/11 0802 12/22/11 0644 12/21/11 2056  GLUCAP 85 117* 81 114* 119* 116*   Hemoglobin A1C:  Basename 12/21/11 2349  HGBA1C 6.2*   Fasting Lipid Panel:  Basename  12/21/11 1804  CHOL 101  HDL 28*  LDLCALC 37  TRIG 604*  CHOLHDL 3.6  LDLDIRECT --   Thyroid Function Tests:  Basename 12/21/11 1804  TSH 1.820  T4TOTAL --  FREET4 --  T3FREE --  THYROIDAB --   Coagulation:  Basename 12/21/11 1804  LABPROT 13.0  INR 0.96    Basename 12/21/11 1948  COLORURINE YELLOW  LABSPEC 1.005  PHURINE 7.0  GLUCOSEU NEGATIVE  HGBUR NEGATIVE  BILIRUBINUR NEGATIVE  KETONESUR NEGATIVE  PROTEINUR NEGATIVE  UROBILINOGEN 0.2  NITRITE NEGATIVE  LEUKOCYTESUR TRACE*    Recent Results (from the past 240 hour(s))  CLOSTRIDIUM DIFFICILE BY PCR     Status: Normal   Collection Time   12/22/11  1:00 AM      Component Value Range Status Comment   C difficile by pcr NEGATIVE  NEGATIVE  Final     Studies/Results: Nm Gi Blood Loss  12/22/2011  *RADIOLOGY REPORT*  Clinical Data: Gastrointestinal bleed.  NUCLEAR MEDICINE GASTROINTESTINAL BLEEDING STUDY  Technique:  Sequential abdominal images were obtained following intravenous administration of Tc-9m labeled red blood cells.  Radiopharmaceutical:  25 mCi technetium 63m.  Comparison: CT abdomen pelvis 12/09/2011  Findings: Images of the abdomen and pelvis were performed over 120 minutes.  Typical of radiotracer distribution is seen within the aortoiliac vasculature, the liver, kidneys, and urinary bladder. No  radiotracer activity is identified in the expected location of the bowel.  There is no migratory radiotracer to suggest blood moving within the bowel over time.  IMPRESSION: No evidence of active gastrointestinal bleeding.  Imaged over 120 minutes.  Original Report Authenticated By: Britta Mccreedy, M.D.    Medications: Scheduled Meds:   . amLODipine  5 mg Oral QPM  . cholecalciferol  2,000 Units Oral Daily  . desvenlafaxine  50 mg Oral Daily  . dicyclomine  20 mg Oral TID  . doxycycline  100 mg Oral Q12H  . insulin aspart  0-15 Units Subcutaneous TID WC  . metroNIDAZOLE  500 mg Oral Q12H  . pantoprazole   40 mg Oral Q1200  . polyethylene glycol  17 g Oral BID  . vitamin C  500 mg Oral Daily  . DISCONTD: linagliptin  5 mg Oral Daily  . DISCONTD: simvastatin  20 mg Oral q1800   Continuous Infusions:   . sodium chloride 150 mL/hr at 12/22/11 0500   PRN Meds:.acetaminophen, acetaminophen, alum & mag hydroxide-simeth, LORazepam, ondansetron (ZOFRAN) IV, ondansetron, oxyCODONE-acetaminophen, zolpidem  Assessment/Plan:  1. GIB  Likely secondary to being on aspirin therapy and recent polypectomies.  -Will follow Hb, observation, despite reports of bloody stools her Hb has been very stable and is starting to rise, patient very upset by this-probably old blood evacuation -Appreciate GI assistance  -ASA stoped   2. 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 She was on a very aggressive regimen of a cholesterol drugs and diabetes drugs that I suspect may be culprits in her pancreatitis and possibly contributing to her diarrhea and abdominal pain-notably her lipid panel was very unusual with a very low LDL cholesterol and HDL cholesterol she was on a fibrate as well as a Vytorin- I have stopped both of these recommend that she not resume these until she has a discussion with her regular physician- metformin and Januvia have also been implicated in causing pancreatitis. My opinion she needs a complete outpatient medication overhaul. Urinalysis was negative.  -Lipase is elevated, IV fluid, and diet as tolerate  -Stop Januvia, Metformin, Fenofibrate. Vytorin    -Continue PPI   3. Type 2 diabetes  A1C is only 6.1 she does not need aggressive oral DM regimen-she could probably be diet controlled-will not resume an DM meds now or at discharge-she can check sugars and discuss with her PCP.   4. Skin/ Soft tissue infection  Subq/follicle nodule in left upper arm inflammed, was started on Doxy a few days ago for MRSA infection  -Will resume Doxy and add on Flagyl for possible anerobic coverage/colitis until c. Diff PCR comes back   5. Dispo:  Pending work-up completion, home when medically stable    LOS: 1 day   [facility] Pager: [phone] 12/22/2011, 10:42 PM

## 2011-12-22 NOTE — Progress Notes (Signed)
1800- RN to bedside to check on patient.  Patient states she wants to leave AMA because the doctors can't find anything wrong with her.  Patient crying and shouting at RN.  MD paged and notified.  RN returned to room at 1845, patient states she has spoken to MD and that she was going to stay the night but continued to shout at [employer].  RN offered comfort and medication.  Patient told RN to "leave me alone".

## 2011-12-23 LAB — HEMOGLOBIN AND HEMATOCRIT, BLOOD
HCT: 29.9 % — ABNORMAL LOW (ref 36.0–46.0)
HCT: 31.9 % — ABNORMAL LOW (ref 36.0–46.0)
Hemoglobin: 9.8 g/dL — ABNORMAL LOW (ref 12.0–15.0)
Hemoglobin: 10.6 g/dL — ABNORMAL LOW (ref 12.0–15.0)
Hemoglobin: 10.6 g/dL — ABNORMAL LOW (ref 12.0–15.0)
Hemoglobin: 10.1 g/dL — ABNORMAL LOW (ref 12.0–15.0)

## 2011-12-23 LAB — GLUCOSE, CAPILLARY
Glucose-Capillary: 115 mg/dL — ABNORMAL HIGH (ref 70–99)
Glucose-Capillary: 93 mg/dL (ref 70–99)

## 2011-12-23 NOTE — Progress Notes (Signed)
Destiny Robinson 9:50 AM  Subjective: The patient is seeing less blood and more formed stool and is still having some cramps and urgency after she eats which predates her colonoscopy and we reviewed her two negative CT and I discussed her case with my partner Dr. Leonard Schwartz. and she has no new complaints Objective: Vital signs stable afebrile no acute distress abdomen is soft nontender good bowel sounds hemoglobin increased  Assessment: Probable post polypectomy bleed stable and chronic GI complaints questionable etiology  Plan: Would observe one more day and and probable discharge soon since she is eating a regular diet and the amount of blood she is seeing is less and her hemoglobin has been very stable and I have moved up her outpatient followup to next week with my partner Dr. Evette Cristal and please call us sooner when necessary  [facility]

## 2011-12-23 NOTE — Progress Notes (Signed)
Subjective: Pt mentions that bleeding has subsided.  Feels better today. Having some streaks of blood with her BM's.  Otherwise no acute issues overnight.  Objective: Filed Vitals:   12/22/11 2210 12/23/11 0700 12/23/11 1420 12/23/11 1602  BP: 126/85 122/75 149/83 139/80  Pulse: 82 78 79   Temp: 96.7 F (35.9 C) 98.4 F (36.9 C) 97.2 F (36.2 C)   TempSrc: Oral Oral Oral   Resp: 18 18 20    Weight:      SpO2: 97% 95% 98%    Weight change:   Intake/Output Summary (Last 24 hours) at 12/23/11 1834 Last data filed at 12/23/11 1300  Gross per 24 hour  Intake    360 ml  Output      0 ml  Net    360 ml    General: Alert, awake, oriented x3, in no acute distress.  HEENT: No bruits, no goiter.  Heart: Regular rate and rhythm, without murmurs, rubs, gallops.  Lungs: Clear to auscultation, no wheezes Abdomen: Soft, nontender, nondistended, positive bowel sounds.  Neuro: Grossly intact, nonfocal.   Lab Results:  Basename 12/22/11 0645 12/21/11 1804 12/21/11 0800  NA 145 -- 142  K 4.1 -- 3.7  CL 108 -- 103  CO2 29 -- 26  GLUCOSE 122* -- 97  BUN 14 -- 25*  CREATININE 0.53 -- 0.53  CALCIUM 8.9 -- 9.4  MG -- 1.8 --  PHOS -- -- --    Basename 12/21/11 1804  AST 23  ALT 15  ALKPHOS 35*  BILITOT 0.2*  PROT 6.3  ALBUMIN 3.6    Basename 12/21/11 1804  LIPASE 89*  AMYLASE --    Basename 12/23/11 1359 12/23/11 0812 12/21/11 1546 12/21/11 0800  WBC -- -- 5.6 6.2  NEUTROABS -- -- -- 3.3  HGB 10.6* 10.6* -- --  HCT 31.9* 31.6* -- --  MCV -- -- 84.1 83.9  PLT -- -- 146* 146*   No results found for this basename: CKTOTAL:3,CKMB:3,CKMBINDEX:3,TROPONINI:3 in the last 72 hours No components found with this basename: POCBNP:3 No results found for this basename: DDIMER:2 in the last 72 hours  Basename 12/21/11 2349  HGBA1C 6.2*    Basename 12/21/11 1804  CHOL 101  HDL 28*  LDLCALC 37  TRIG 161*  CHOLHDL 3.6  LDLDIRECT --    Basename 12/21/11 1804  TSH 1.820    T4TOTAL --  T3FREE --  THYROIDAB --   No results found for this basename: VITAMINB12:2,FOLATE:2,FERRITIN:2,TIBC:2,IRON:2,RETICCTPCT:2 in the last 72 hours  Micro Results: Recent Results (from the past 240 hour(s))  STOOL CULTURE     Status: Normal (Preliminary result)   Collection Time   12/22/11  1:00 AM      Component Value Range Status Comment   Specimen Description STOOL   Final    Special Requests NONE   Final    Culture Culture reincubated for better growth   Final    Report Status PENDING   Incomplete   CLOSTRIDIUM DIFFICILE BY PCR     Status: Normal   Collection Time   12/22/11  1:00 AM      Component Value Range Status Comment   C difficile by pcr NEGATIVE  NEGATIVE  Final     Studies/Results: Nm Gi Blood Loss  12/22/2011  *RADIOLOGY REPORT*  Clinical Data: Gastrointestinal bleed.  NUCLEAR MEDICINE GASTROINTESTINAL BLEEDING STUDY  Technique:  Sequential abdominal images were obtained following intravenous administration of Tc-34m labeled red blood cells.  Radiopharmaceutical:  25 mCi technetium 74m.  Comparison: CT abdomen pelvis 12/09/2011  Findings: Images of the abdomen and pelvis were performed over 120 minutes.  Typical of radiotracer distribution is seen within the aortoiliac vasculature, the liver, kidneys, and urinary bladder. No radiotracer activity is identified in the expected location of the bowel.  There is no migratory radiotracer to suggest blood moving within the bowel over time.  IMPRESSION: No evidence of active gastrointestinal bleeding.  Imaged over 120 minutes.  Original Report Authenticated By: Britta Mccreedy, M.D.    Medications: I have reviewed the patient's current medications.  Abdominal discomfort:  Patient's abdominal discomfort is subsiding.  Etiology uncertain at this point.  Patient Active Hospital Problem List: GIB (gastrointestinal bleeding) (12/21/2011)  Anemia:  Blood loss anemia from # 2.  Currently subsiding.  Will follow CBC's.  Likely  thought to be a post polypectomy bleed.  Will follow up with GI's recommendations and discharge patient once they feel patient is medically stable from their stand point.  Will recheck cbc tomorrow.  Soft tissue infection (12/21/2011) Subq/follicle nodule in left upper arm inflammed, was started on Doxy a few days ago for MRSA infection  -Will resume Doxy and add on Flagyl for possible anerobic coverage/colitis until c. Diff PCR comes back   Type 2 diabetes  A1C is only 6.1 she does not need aggressive oral DM regimen-she could probably be diet controlled-will not resume an DM meds now or at discharge-she can check sugars and discuss with her PCP.     LOS: 2 days   Penny Pia M.D.  [facility] 12/23/2011, 6:34 PM

## 2011-12-23 NOTE — Progress Notes (Signed)
   CARE MANAGEMENT NOTE 12/23/2011  Patient:  Destiny Robinson, Destiny Robinson   Account Number:  [id number]  Date Initiated:  12/23/2011  Documentation initiated by:  Onnie Boer  Subjective/Objective Assessment:   PT WAS ADMITTED WITH ABD PAIN     Action/Plan:   PROGRESSION OF CARE AND DISCHARGE PLANNING   Anticipated DC Date:  12/25/2011   Anticipated DC Plan:  HOME/SELF CARE      DC Planning Services  CM consult      Choice offered to / List presented to:             Status of service:  In process, will continue to follow Medicare Important Message given?   (If response is "NO", the following Medicare IM given date fields will be blank) Date Medicare IM given:   Date Additional Medicare IM given:    Discharge Disposition:    Per UR Regulation:    If discussed at Long Length of Stay Meetings, dates discussed:    Comments:  12/23/11 Onnie Boer, RN, BSN [address] [place]  PT WAS ADMITTED WITH ABD PAIN.  PTA PT WAS AT HOME WITH SELF CARE.  PT IS NOT EMPLOYED AND HAS NO INSURANCE.  PT HAS BEEN CONTACTED BY FINANCIAL COORDINATORS.  WILL F/U ON OTHER DC NEEDS.

## 2011-12-24 LAB — GLUCOSE, CAPILLARY: Glucose-Capillary: 131 mg/dL — ABNORMAL HIGH (ref 70–99)

## 2011-12-24 NOTE — Progress Notes (Signed)
D/c instructions reviewed with pt and her sister. Education provided. Both verbalized understanding of all instructions. Copy of instructions given to pt. No new meds. Pt had no further questions at this time. Pt d/c'd via wheelchair with belongings, with family escorted by hospital volunteer.

## 2011-12-24 NOTE — Progress Notes (Signed)
GASTROENTEROLOGY PROGRESS NOTE  Problem:   Diverticular hemorrhage. Posthemorrhagic anemia.  Subjective: Feels well. No bowel movements, no bleeding. Feels ready to go home.  Objective: Hemoglobin was 10.1 yesterday, not rechecked today.  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  Plan: Agree with plans for discharge. Patient has a followup appointment with Dr. Wandalee Ferdinand. Advised to avoid aspirin at least until seen by Dr. Evette Cristal. The patient and her daughter are aware that there is a slight risk, perhaps 10%, that she will have a delayed recurrence of her diverticular bleeding, in which case she knows to call our office day or night.  Destiny Robinson, M.D. 12/24/2011 1:23 PM

## 2011-12-24 NOTE — Discharge Summary (Signed)
Admit date: 12/21/2011 Discharge date: 12/24/2011  Primary Care Physician:  Josue Hector, MD, MD   Discharge Diagnoses:   No resolved problems to display.  Active Hospital Problems  Diagnoses Date Noted   . Abdominal pain 12/21/2011   . GIB (gastrointestinal bleeding) 12/21/2011   . Soft tissue infection 12/21/2011     Resolved Hospital Problems  Diagnoses Date Noted Date Resolved     DISCHARGE MEDICATION: Medication List  As of 12/24/2011 12:42 PM   STOP taking these medications         dicyclomine 20 MG tablet      ezetimibe-simvastatin 10-40 MG per tablet      glipiZIDE 5 MG 24 hr tablet      oxyCODONE-acetaminophen 5-325 MG per tablet      sitaGLIPtin 100 MG tablet      TRILIPIX 135 MG capsule         TAKE these medications         acyclovir cream 5 %   Commonly known as: ZOVIRAX   Apply 1 application topically every 3 (three) hours.      amLODipine 5 MG tablet   Commonly known as: NORVASC   Take 5 mg by mouth every evening.      aspirin EC 81 MG tablet   Take 81 mg by mouth daily.      CREON PO   Take 1 capsule by mouth 3 (three) times daily with meals.      desvenlafaxine 50 MG 24 hr tablet   Commonly known as: PRISTIQ   Take 50 mg by mouth daily.      doxycycline 100 MG tablet   Commonly known as: VIBRA-TABS   Take 100 mg by mouth 2 (two) times daily. Take for 10 days      fluconazole 100 MG tablet   Commonly known as: DIFLUCAN   Take 100 mg by mouth every other day.      LORazepam 0.5 MG tablet   Commonly known as: ATIVAN   Take 0.5 mg by mouth 3 (three) times daily as needed. FOR ANXIETY      Lysine 500 MG Caps   Take 1 capsule by mouth every morning.      metFORMIN 500 MG tablet   Commonly known as: GLUCOPHAGE   Take 1,000 mg by mouth 2 (two) times daily with a meal.      mulitivitamin with minerals Tabs   Take 1 tablet by mouth daily.      omeprazole 40 MG capsule   Commonly known as: PRILOSEC   Take 40 mg by mouth  daily.      potassium chloride 10 MEQ tablet   Commonly known as: K-DUR   Take 10 mEq by mouth every morning.      valsartan-hydrochlorothiazide 160-12.5 MG per tablet   Commonly known as: DIOVAN-HCT   Take 1 tablet by mouth daily.      vitamin C 500 MG tablet   Commonly known as: ASCORBIC ACID   Take 500 mg by mouth daily.      Vitamin D 2000 UNITS tablet   Take 2,000 Units by mouth daily.              Consults: Treatment Team:  Florencia Reasons, MD   SIGNIFICANT DIAGNOSTIC STUDIES:  Nm Gi Blood Loss  12/22/2011  *RADIOLOGY REPORT*  Clinical Data: Gastrointestinal bleed.  NUCLEAR MEDICINE GASTROINTESTINAL BLEEDING STUDY  Technique:  Sequential abdominal images were obtained following intravenous administration of Tc-6m labeled  red blood cells.  Radiopharmaceutical:  25 mCi technetium 32m.  Comparison: CT abdomen pelvis 12/09/2011  Findings: Images of the abdomen and pelvis were performed over 120 minutes.  Typical of radiotracer distribution is seen within the aortoiliac vasculature, the liver, kidneys, and urinary bladder. No radiotracer activity is identified in the expected location of the bowel.  There is no migratory radiotracer to suggest blood moving within the bowel over time.  IMPRESSION: No evidence of active gastrointestinal bleeding.  Imaged over 120 minutes.  Original Report Authenticated By: Britta Mccreedy, M.D.   Ct Abdomen Pelvis W Contrast  12/09/2011  *RADIOLOGY REPORT*  Clinical Data: Abdominal pain with nausea, vomiting, diarrhea.  CT ABDOMEN AND PELVIS WITH CONTRAST  Technique:  Multidetector CT imaging of the abdomen and pelvis was performed following the standard protocol during bolus administration of intravenous contrast.  Contrast: 80mL OMNIPAQUE IOHEXOL 300 MG/ML IJ SOLN  Comparison: 11/29/2011  Findings: The liver, spleen, pancreas, adrenal glands, and kidneys are normal.  Gallbladder is been removed.  No dilated bile ducts. Bowel appears normal including  the terminal ileum and appendix. Uterus has been removed.  Ovaries are not visible.  Small periumbilical hernia containing only fat, stable.  No acute osseous abnormality.  IMPRESSION: No acute abnormality of the abdomen or pelvis.  No change since 11/29/2011.  Original Report Authenticated By: Gwynn Burly, M.D.   Ct Abdomen Pelvis W Contrast  11/29/2011  *RADIOLOGY REPORT*  Clinical Data: 56 year old female with abdominal and pelvic pain, nausea and vomiting.  CT ABDOMEN AND PELVIS WITH CONTRAST  Technique:  Multidetector CT imaging of the abdomen and pelvis was performed following the standard protocol during bolus administration of intravenous contrast.  Contrast: OMNIPAQUE IOHEXOL 300 MG/ML IV SOLN  Comparison: None  Findings: Probable fatty infiltration of the liver is noted. No focal hepatic abnormalities are present. The spleen, adrenal glands, pancreas, and kidneys are unremarkable. The patient is status post cholecystectomy and hysterectomy.  No free fluid, enlarged lymph nodes, biliary dilation or abdominal aortic aneurysm identified. The bowel, appendix and bladder are unremarkable.  A small to moderate periumbilical hernia containing fat is noted.  No acute or suspicious bony abnormalities are identified.  IMPRESSION: No evidence of acute abnormality.  Probable fatty infiltration of the liver.  Small to moderate periumbilical hernia containing fat.  Original Report Authenticated By: Rosendo Gros, M.D.   Dg Foot 2 Views Left  12/12/2011  *RADIOLOGY REPORT*  Clinical Data: Trauma to the third toe.  LEFT FOOT - 2 VIEW  Comparison: None.  Findings: There is a fracture of the distal phalanx of the third toe.  No displaced fragments are seen.  The remainder the foot appears negative.  IMPRESSION: Fracture of the distal phalanx of the third toe.  Original Report Authenticated By: Thomasenia Sales, M.D.      CARDIAC CATH & OTHER PROCEDURES: Patient had multiple H/H and was fount to stabilize  with last hemoglobin level at 10.1.   Recent Results (from the past 240 hour(s))  STOOL CULTURE     Status: Normal (Preliminary result)   Collection Time   12/22/11  1:00 AM      Component Value Range Status Comment   Specimen Description STOOL   Final    Special Requests NONE   Final    Culture NO SUSPICIOUS COLONIES, CONTINUING TO HOLD   Final    Report Status PENDING   Incomplete   CLOSTRIDIUM DIFFICILE BY PCR     Status: Normal  Collection Time   12/22/11  1:00 AM      Component Value Range Status Comment   C difficile by pcr NEGATIVE  NEGATIVE  Final     BRIEF ADMITTING H & P:  Destiny Robinson is a 56 y.o. female who has a several week h/o low abd pn, for which she underwent inpatient colonoscopy by Dr. Dorena Cookey 10 days ago. She had 7 proximal colonic polyps removed by snare cautery, as well as a somewhat larger polyp in the transverse colon which could not be removed at the time due to its unusual position. She did well thereafter, including improvement in her abd pn over the past several days, until this morning when she began to have multiple episodes of hematochezia, associated with dizziness but no syncope.   Please see below for the hospital problem list and respective recommendations.  1. GIB  Likely secondary to being on aspirin therapy and recent polypectomies.  Patient has not had any more bloody stools and has resolved.  We have discontinue her aspirin.  Patient has GI follow up for next week.    2. 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 She was on a very aggressive regimen of a cholesterol drugs and diabetes drugs that I suspect may be culprits in her pancreatitis and possibly contributing to her diarrhea and abdominal pain-notably her lipid panel was very unusual with a very low LDL cholesterol and HDL cholesterol she was on a fibrate as well as a Vytorin- I have stopped both of these recommend that she not resume these until she has a discussion with her regular physician- metformin and Januvia have also been implicated in causing pancreatitis. My opinion she needs a complete outpatient medication overhaul. Urinalysis was negative.  -Lipase is elevated, IV fluid, and diet as tolerate  -Stop Januvia,Fenofibrate. Vytorin    3. Type 2 diabetes  A1C is only 6.2 on 12/21/11 she does not need aggressive oral DM regimen.  Will have her continue her metformin for now but will hold her other hypoglycemics.  Recommend discussion with her primary care physician.  4. Skin/ Soft tissue infection  Subq/follicle nodule in left upper arm inflammed, was started on Doxy a few days ago for MRSA infection  -Will resume Doxy on discharge.  Patient has doxycycline at home and reports that she was prescribed a 10 day course.  Will have her follow up with her primary care physician.    No resolved problems to display.  Active Hospital Problems  Diagnoses Date Noted   . Abdominal pain 12/21/2011   . GIB (gastrointestinal bleeding) 12/21/2011   . Soft tissue infection 12/21/2011     Resolved Hospital Problems  Diagnoses Date Noted Date Resolved     Disposition and Follow-up:  Discharge Orders    Future Orders Please Complete By Expires   Diet - low sodium heart healthy      Increase activity slowly      Discharge instructions      Comments:   Please stop taking your trilipix, vytorin, januvia, and  glucotrol until cleared by your primary care physician.  Your last hemoglobin A1c was 6.2.  Continue taking your Metformin and  continue to check your blood sugars at home.  Also finish all of your oral antibiotics and follow up with your primary care physician.   Call MD for:  temperature >100.4      Call MD for:  severe uncontrolled pain      Call MD for:  redness, tenderness, or signs of infection (pain, swelling, redness, odor or green/yellow discharge around incision site)        Follow-up Information    Follow up with Josue Hector, MD .          DISCHARGE EXAM:   General: Alert, awake, oriented x3, in no acute distress.  HEENT: No bruits, no goiter.  Heart: Regular rate and rhythm, without murmurs, rubs, gallops.  Lungs: Clear to auscultation bilaterally.  Abdomen: Soft, nontender, nondistended, positive bowel sounds.  Extremities: No clubbing cyanosis or edema with positive pedal pulses.  Neuro: Grossly intact, nonfocal. Skin:  There is subcutaneous nodule that is non erythematous with no overlying cellulitis ~ 2x2cm non adherent to underlying musculature.   Blood pressure 127/84, pulse 71, temperature 96.7 F (35.9 C), temperature source Oral, resp. rate 18, weight 85.4 kg (188 lb 4.4 oz), SpO2 97.00%.   Basename 12/22/11 0645 12/21/11 1804  NA 145 --  K 4.1 --  CL 108 --  CO2 29 --  GLUCOSE 122* --  BUN 14 --  CREATININE 0.53 --  CALCIUM 8.9 --  MG -- 1.8  PHOS -- --    Basename 12/21/11 1804  AST 23  ALT 15  ALKPHOS 35*  BILITOT 0.2*  PROT 6.3  ALBUMIN 3.6    Basename 12/21/11 1804  LIPASE 89*  AMYLASE --    Basename 12/23/11 1935 12/23/11 1359 12/21/11 1546  WBC -- -- 5.6  NEUTROABS -- -- --  HGB 10.1* 10.6* --  HCT 30.1* 31.9* --  MCV -- -- 84.1  PLT -- -- 146*    Signed: Penny Pia M.D. 12/24/2011, 12:42 PM

## 2011-12-25 LAB — GLUCOSE, CAPILLARY

## 2011-12-30 ENCOUNTER — Other Ambulatory Visit (HOSPITAL_COMMUNITY): Payer: Self-pay | Admitting: Gastroenterology

## 2011-12-30 DIAGNOSIS — R1110 Vomiting, unspecified: Secondary | ICD-10-CM

## 2012-01-08 ENCOUNTER — Encounter (HOSPITAL_COMMUNITY): Payer: Self-pay

## 2012-01-08 ENCOUNTER — Encounter (HOSPITAL_COMMUNITY)
Admission: RE | Admit: 2012-01-08 | Discharge: 2012-01-08 | Disposition: A | Discharge status: Home / Self Care | Payer: Self-pay | Source: Ambulatory Visit | Attending: Gastroenterology | Admitting: Gastroenterology

## 2012-01-08 DIAGNOSIS — R1013 Epigastric pain: Secondary | ICD-10-CM | POA: Insufficient documentation

## 2012-01-08 DIAGNOSIS — E119 Type 2 diabetes mellitus without complications: Secondary | ICD-10-CM | POA: Insufficient documentation

## 2012-01-08 DIAGNOSIS — R1110 Vomiting, unspecified: Secondary | ICD-10-CM | POA: Insufficient documentation

## 2012-01-08 DIAGNOSIS — K3189 Other diseases of stomach and duodenum: Secondary | ICD-10-CM | POA: Insufficient documentation

## 2012-01-08 MED ORDER — TECHNETIUM TC 99M SULFUR COLLOID
2.2000 | Freq: Once | INTRAVENOUS | Status: AC | PRN
  Administered 2012-01-08: 2.2 via ORAL

## 2012-01-26 ENCOUNTER — Other Ambulatory Visit: Payer: Self-pay | Admitting: Gastroenterology

## 2012-04-26 ENCOUNTER — Other Ambulatory Visit (HOSPITAL_COMMUNITY): Payer: Self-pay | Admitting: Family Medicine

## 2012-04-26 ENCOUNTER — Ambulatory Visit (HOSPITAL_COMMUNITY)
Admission: RE | Admit: 2012-04-26 | Discharge: 2012-04-26 | Disposition: A | Discharge status: Home / Self Care | Payer: Medicaid Other | Source: Ambulatory Visit | Attending: Gastroenterology | Admitting: Gastroenterology

## 2012-04-26 ENCOUNTER — Encounter (HOSPITAL_COMMUNITY): Payer: Self-pay | Admitting: *Deleted

## 2012-04-26 ENCOUNTER — Encounter (HOSPITAL_COMMUNITY)
Admission: RE | Disposition: A | Discharge status: Home / Self Care | Payer: Self-pay | Source: Ambulatory Visit | Attending: Gastroenterology

## 2012-04-26 DIAGNOSIS — J45909 Unspecified asthma, uncomplicated: Secondary | ICD-10-CM | POA: Insufficient documentation

## 2012-04-26 DIAGNOSIS — Z09 Encounter for follow-up examination after completed treatment for conditions other than malignant neoplasm: Secondary | ICD-10-CM | POA: Insufficient documentation

## 2012-04-26 DIAGNOSIS — E119 Type 2 diabetes mellitus without complications: Secondary | ICD-10-CM | POA: Insufficient documentation

## 2012-04-26 DIAGNOSIS — E7800 Pure hypercholesterolemia, unspecified: Secondary | ICD-10-CM | POA: Insufficient documentation

## 2012-04-26 DIAGNOSIS — I10 Essential (primary) hypertension: Secondary | ICD-10-CM | POA: Insufficient documentation

## 2012-04-26 DIAGNOSIS — Z139 Encounter for screening, unspecified: Secondary | ICD-10-CM

## 2012-04-26 DIAGNOSIS — Z9089 Acquired absence of other organs: Secondary | ICD-10-CM | POA: Insufficient documentation

## 2012-04-26 DIAGNOSIS — D126 Benign neoplasm of colon, unspecified: Secondary | ICD-10-CM | POA: Insufficient documentation

## 2012-04-26 HISTORY — PX: COLONOSCOPY: SHX5424

## 2012-04-26 SURGERY — COLONOSCOPY
Anesthesia: Moderate Sedation

## 2012-04-26 MED ORDER — FENTANYL CITRATE 0.05 MG/ML IJ SOLN
INTRAMUSCULAR | Status: AC
  Filled 2012-04-26: qty 2

## 2012-04-26 MED ORDER — SODIUM CHLORIDE 0.9 % IV SOLN
INTRAVENOUS | Status: DC
  Administered 2012-04-26: 500 mL via INTRAVENOUS

## 2012-04-26 MED ORDER — FENTANYL CITRATE 0.05 MG/ML IJ SOLN
INTRAMUSCULAR | Status: DC | PRN
  Administered 2012-04-26: 15 ug via INTRAVENOUS
  Administered 2012-04-26 (×3): 25 ug via INTRAVENOUS
  Administered 2012-04-26: 10 ug via INTRAVENOUS

## 2012-04-26 MED ORDER — MIDAZOLAM HCL 10 MG/2ML IJ SOLN
INTRAMUSCULAR | Status: AC
  Filled 2012-04-26: qty 2

## 2012-04-26 MED ORDER — MIDAZOLAM HCL 5 MG/5ML IJ SOLN
INTRAMUSCULAR | Status: DC | PRN
  Administered 2012-04-26: 1 mg via INTRAVENOUS
  Administered 2012-04-26: 2 mg via INTRAVENOUS
  Administered 2012-04-26: 1 mg via INTRAVENOUS
  Administered 2012-04-26: 2 mg via INTRAVENOUS
  Administered 2012-04-26: 1.5 mg via INTRAVENOUS
  Administered 2012-04-26: 2 mg via INTRAVENOUS

## 2012-04-26 MED ORDER — DIPHENHYDRAMINE HCL 50 MG/ML IJ SOLN
INTRAMUSCULAR | Status: AC
  Filled 2012-04-26: qty 1

## 2012-04-26 NOTE — H&P (Signed)
History: The patient is a 89 her old female who presents today to the outpatient endoscopy center at [facility] for a followup colonoscopy do to a history of colon polyps. Her last colonoscopy was in late April of this year with removal of several tubular adenomas and there was one adenoma in the descending colon that was removed but we are bringing her back today to see if there is any residual polyp.  Past history hypertension, diabetes, hypercholesterolemia, asthma, bronchitis, fatty liver, depression,  Surgical history hysterectomy, cholecystectomy, tonsillectomy, C-section  Social history former smoker, denies alcohol  Allergies penicillin, sulfa, Crestor, codeine,  Systems review denies chest pain or shortness of breath  Physical: Vital signs stable, she is alert and oriented  Heart regular rhythm no murmurs  Lungs clear  Abdomen is soft and nontender  Impression: History of colon polyps  Plan colonoscopy

## 2012-04-26 NOTE — Op Note (Signed)
[facility] [address] [place], [place]  [address]  COLONOSCOPY PROCEDURE REPORT  PATIENT:  Novelle, Addair  MR#:  [id number] BIRTHDATE:  1956-03-20, 55 yrs. old  GENDER:  female ENDOSCOPIST:  Wandalee Ferdinand, MD REF. BY: PROCEDURE DATE:  04/26/2012 PROCEDURE: Colonoscopy with polypectomy ASA CLASS: 2 INDICATIONS: History of colon polyps check for residual polyp from prior polypectomy in the ascending colon MEDICATIONS:  Fentanyl 100 mcg IV, Versed 10 mg IV DESCRIPTION OF PROCEDURE:   After the risks benefits and alternatives of the procedure were thoroughly explained, informed consent was obtained.  The  endoscope was introduced through the anus and advanced to the cecum with identification of the ileocecal valve and appendiceal orifice.  The quality of the prep was good.  The instrument was then slowly withdrawn as the colon was fully examined. <<PROCEDUREIMAGES>>  FINDINGS: The site of the previous polypectomy where the [place] ink stain was also noted did not reveal any residual polyp.  In the ascending colon there were 3 sessile polyps ranging in size from 0.5-1 cm and these were removed by snare cautery technique.  COMPLICATIONS:  No immediate complications  IMPRESSION: Colon polyps  RECOMMENDATIONS: Check pathology.  ______________________________ Wandalee Ferdinand, MD  CC:  n. eSIGNED:   Sam Kanishk Stroebel at 04/26/2012 10:11 AM  Trilby Leaver, [phone]

## 2012-04-27 ENCOUNTER — Encounter (HOSPITAL_COMMUNITY): Payer: Self-pay | Admitting: Gastroenterology

## 2012-04-27 ENCOUNTER — Encounter (HOSPITAL_COMMUNITY): Payer: Self-pay

## 2012-05-02 ENCOUNTER — Ambulatory Visit (HOSPITAL_COMMUNITY)
Admission: RE | Admit: 2012-05-02 | Discharge: 2012-05-02 | Disposition: A | Discharge status: Home / Self Care | Payer: Medicaid Other | Source: Ambulatory Visit | Attending: Family Medicine | Admitting: Family Medicine

## 2012-05-02 DIAGNOSIS — Z139 Encounter for screening, unspecified: Secondary | ICD-10-CM

## 2012-05-02 DIAGNOSIS — Z1231 Encounter for screening mammogram for malignant neoplasm of breast: Secondary | ICD-10-CM | POA: Insufficient documentation

## 2012-06-07 ENCOUNTER — Emergency Department (HOSPITAL_COMMUNITY)
Admission: EM | Admit: 2012-06-07 | Discharge: 2012-06-07 | Disposition: A | Discharge status: Home / Self Care | Payer: Medicaid Other | Attending: Emergency Medicine | Admitting: Emergency Medicine

## 2012-06-07 ENCOUNTER — Encounter (HOSPITAL_COMMUNITY): Payer: Self-pay

## 2012-06-07 ENCOUNTER — Emergency Department (HOSPITAL_COMMUNITY): Payer: Medicaid Other

## 2012-06-07 DIAGNOSIS — J45909 Unspecified asthma, uncomplicated: Secondary | ICD-10-CM | POA: Insufficient documentation

## 2012-06-07 DIAGNOSIS — Z79899 Other long term (current) drug therapy: Secondary | ICD-10-CM | POA: Insufficient documentation

## 2012-06-07 DIAGNOSIS — T466X5A Adverse effect of antihyperlipidemic and antiarteriosclerotic drugs, initial encounter: Secondary | ICD-10-CM | POA: Insufficient documentation

## 2012-06-07 DIAGNOSIS — Z87891 Personal history of nicotine dependence: Secondary | ICD-10-CM | POA: Insufficient documentation

## 2012-06-07 DIAGNOSIS — Z889 Allergy status to unspecified drugs, medicaments and biological substances status: Secondary | ICD-10-CM

## 2012-06-07 DIAGNOSIS — I10 Essential (primary) hypertension: Secondary | ICD-10-CM | POA: Insufficient documentation

## 2012-06-07 DIAGNOSIS — Z9089 Acquired absence of other organs: Secondary | ICD-10-CM | POA: Insufficient documentation

## 2012-06-07 DIAGNOSIS — E119 Type 2 diabetes mellitus without complications: Secondary | ICD-10-CM | POA: Insufficient documentation

## 2012-06-07 DIAGNOSIS — Z888 Allergy status to other drugs, medicaments and biological substances status: Secondary | ICD-10-CM | POA: Insufficient documentation

## 2012-06-07 DIAGNOSIS — E785 Hyperlipidemia, unspecified: Secondary | ICD-10-CM | POA: Insufficient documentation

## 2012-06-07 HISTORY — DX: Other diseases of stomach and duodenum: K31.89

## 2012-06-07 MED ORDER — SODIUM CHLORIDE 0.9 % IV SOLN
Freq: Once | INTRAVENOUS | Status: AC
  Administered 2012-06-07: 1000 mL via INTRAVENOUS

## 2012-06-07 MED ORDER — DIPHENHYDRAMINE HCL 50 MG/ML IJ SOLN
25.0000 mg | Freq: Once | INTRAMUSCULAR | Status: AC
  Administered 2012-06-07: 25 mg via INTRAVENOUS
  Filled 2012-06-07: qty 1

## 2012-06-07 MED ORDER — FAMOTIDINE IN NACL 20-0.9 MG/50ML-% IV SOLN
20.0000 mg | Freq: Once | INTRAVENOUS | Status: AC
  Administered 2012-06-07: 20 mg via INTRAVENOUS
  Filled 2012-06-07: qty 50

## 2012-06-07 NOTE — ED Provider Notes (Signed)
History  This chart was scribed for Flint Melter, MD by Ladona Ridgel Day. This patient was seen in room APA08/APA08 and the patient's care was started at 1108.   CSN: [id number]  Arrival date & time 06/07/12  1108   First MD Initiated Contact with Patient 06/07/12 1217      Chief Complaint  Patient presents with  . Allergic Reaction   The history is provided by the patient. No language interpreter was used.   Destiny Robinson is a 56 y.o. female who presents to the Emergency Department complaining of an allergic reaction to a medication that she began 6 days ago involving itchiness over her body and hives over her chest and buttocks. She recently started xifaxan last Wednesday by Dr. Chrissie Noa for an overgrowth of bacteria in her small intestines. She states that she regularly has nausea, emesis and diarrhea and that she is at her baseline today and yesterday except now she is having an allergic reaction to her new medicine. Her last episode of emesis was three days ago. She denies any chest pain. She states a mild sore throat and raspy voice which is new. She states allergic to Zocor, codeine, and PCN.   Past Medical History  Diagnosis Date  . Hypertension   . Hypercholesterolemia   . Bronchitis   . Pancreatitis   . Complication of anesthesia 12/11/11    "angry, mean, hateful after" endoscopy & colonoscopy  . Asthma ~ 1991    "come and go"  . Pneumonia 06/2011; 07/2011  . Diabetes mellitus type 2, controlled   . GERD (gastroesophageal reflux disease)   . GI bleeding 12/21/11    ""that's why I'm here"  . Hx of colonoscopy with polypectomy 12/11/11    "took out 7; couldn't get #8, that one was precancerous"  . Depression   . 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 Paralysis gastric     Past Surgical History  Procedure Date  . Cesarean section 1981; 1986  . Esophagogastroduodenoscopy 12/11/2011    Procedure: ESOPHAGOGASTRODUODENOSCOPY (EGD);  Surgeon: Barrie Folk, MD;  [facility] END[facility]COPY;  Service: Endoscopy;  Laterality: N/A;  . Colonoscopy 12/11/2011    Procedure: COLONOSCOPY;  Surgeon: Barrie Folk, MD;  Location: [facility] ENDOSCOPY;  Service: Endoscopy;  Laterality: N/A;  . Abdominal hysterectomy 2002  . Cholecystectomy ~ 2002  . Tonsillectomy and adenoidectomy 1988  . Appendectomy ~ 2002    "w/hysterectomy"  . Colonoscopy 04/26/2012    Procedure: COLONOSCOPY;  Surgeon: Graylin Shiver, MD;  Location: [facility] ENDOSCOPY;  Service: Endoscopy;  Laterality: N/A;  apc    Family History  Problem Relation Age of Onset  . Colon cancer Sister     History  Substance Use Topics  . Smoking status: Former Smoker -- 0.1 packs/day for 6 years    Types: Cigarettes    Quit date: 12/09/1996  . Smokeless tobacco: Never Used  . Alcohol Use: No    OB History    Grav Para Term Preterm Abortions TAB SAB Ect Mult Living                  Review of Systems  Constitutional: Negative for fever and chills.  HENT: Positive for sore throat and voice change. Negative for congestion and rhinorrhea.   Respiratory: Negative for shortness of breath.   Cardiovascular: Negative for chest pain.  Gastrointestinal: Positive for nausea and vomiting. Negative for abdominal pain.  Musculoskeletal: Negative for back pain.  Skin: Positive for rash (hives over her chest, buttocks and itchy all over her body).  Neurological: Negative for weakness and headaches.  All other systems reviewed and are negative.    Allergies  Codeine; Penicillins; Reglan; Sulfa antibiotics; Xifaxan; and Zocor  Home Medications   Current Outpatient Rx  Name Route Sig Dispense Refill  . ACYCLOVIR 5 % EX CREA Topical Apply 1 application topically every 3 (three) hours as needed. Fever Blisters    . AMLODIPINE BESYLATE 5 MG PO TABS Oral Take 5 mg by mouth every evening.    . CHOLINE FENOFIBRATE 135 MG PO CPDR Oral Take 135 mg by mouth daily.    . DESVENLAFAXINE SUCCINATE ER 50 MG PO TB24 Oral  Take 50 mg by mouth daily.    . DEXLANSOPRAZOLE 60 MG PO CPDR Oral Take 60 mg by mouth daily.    Marland Kitchen EZETIMIBE-SIMVASTATIN 10-40 MG PO TABS Oral Take 1 tablet by mouth at bedtime.    Marland Kitchen FLUCONAZOLE 100 MG PO TABS Oral Take 100 mg by mouth at bedtime.     Marland Kitchen GLIPIZIDE ER 5 MG PO TB24 Oral Take 5 mg by mouth daily.    Marland Kitchen HYOSCYAMINE SULFATE 0.125 MG SL SUBL Sublingual Place 0.125 mg under the tongue every 4 (four) hours as needed. Stomach Cramps    . LORAZEPAM 0.5 MG PO TABS Oral Take 0.5 mg by mouth 3 (three) times daily as needed. FOR ANXIETY    . POTASSIUM CHLORIDE ER 10 MEQ PO TBCR Oral Take 10 mEq by mouth every morning.    Marland Kitchen PSEUDOEPHEDRINE HCL 30 MG PO TABS Oral Take 30 mg by mouth daily as needed. Congestion    . SITAGLIPTIN PHOSPHATE 100 MG PO TABS Oral Take 100 mg by mouth daily.    Marland Kitchen VALSARTAN-HYDROCHLOROTHIAZIDE 160-12.5 MG PO TABS Oral Take 1 tablet by mouth daily.      Triage Vitals: BP 143/88  Pulse 85  Temp 98.1 F (36.7 C) (Oral)  Resp 22  Ht 5\' 2"  (1.575 m)  Wt 181 lb (82.101 kg)  BMI 33.11 kg/m2  SpO2 94%  Physical Exam  Nursing note and vitals reviewed. Constitutional: She is oriented to person, place, and time. She appears well-developed and well-nourished. No distress.  HENT:  Head: Normocephalic and atraumatic.  Eyes: EOM are normal.  Neck: Neck supple. No tracheal deviation present.  Cardiovascular: Normal rate, regular rhythm and normal heart sounds.   No murmur heard. Pulmonary/Chest: Effort normal and breath sounds normal. No respiratory distress. She has no wheezes. She has no rales.  Abdominal: There is tenderness (Mild diffuse tenderness.).  Musculoskeletal: Normal range of motion.  Neurological: She is alert and oriented to person, place, and time.  Skin: Skin is warm and dry.  Psychiatric: She has a normal mood and affect. Her behavior is normal.    ED Course  Procedures (including critical care time) DIAGNOSTIC STUDIES: Oxygen Saturation is 94% on  room air, adequate by my interpretation.     Date: 06/07/2012  Rate: 73  Rhythm: normal sinus rhythm  QRS Axis: normal  Intervals: normal  ST/T Wave abnormalities: normal  Conduction Disutrbances:none  Narrative Interpretation:   Old EKG Reviewed: unchanged    COORDINATION OF CARE: At 100 PM Discussed treatment plan with patient which includes pepcid, benadryl, IV fluids, and CXR. Patient agrees.   345 PM Patient states that she feels better and informed her that our tests here in the ED are normal and  without any abnormalities. Patient agrees and understands.   Plan: Home Medications- benadryl and pepcid; Home Treatments- take her medicines for 5 days; Stop Xifaxan.  Recommended follow up- return to ED if her symptoms change or worsen. Contact GI doctor who Rx Xifaxan.  Labs Reviewed - No data to display Dg Chest Portable 1 View  06/07/2012  *RADIOLOGY REPORT*  Clinical Data: Asthma  PORTABLE CHEST - 1 VIEW  Comparison: 07/02/2011  Findings: Mild cardiomegaly.  Normal vascularity.  No pneumothorax. Clear lungs.  IMPRESSION: No active cardiopulmonary disease.  Cardiomegaly.   Original Report Authenticated By: Donavan Burnet, M.D.      1. Drug allergy       MDM  Drug allergy with mild respiratory distress. Doubt anaphylaxis, or significant angioedema. Doubt metabolic instability, serious bacterial infection or impending vascular collapse; the patient is stable for discharge.      I personally performed the services described in this documentation, which was scribed in my presence. The recorded information has been reviewed and considered.           Flint Melter, MD 06/07/12 2035

## 2012-06-07 NOTE — ED Notes (Signed)
Pt reports started taking xifaxan last Wednesday for over growth of bacteria in intestines.  PT says yesterday started noticing a little difference in breathing.  This morning woke up with hives on buttocks, itching, and worsening SOB.

## 2012-07-07 ENCOUNTER — Ambulatory Visit (HOSPITAL_BASED_OUTPATIENT_CLINIC_OR_DEPARTMENT_OTHER): Payer: Medicare Other | Attending: Family Medicine

## 2012-07-07 VITALS — Ht 62.0 in | Wt 177.0 lb

## 2012-07-07 DIAGNOSIS — G4733 Obstructive sleep apnea (adult) (pediatric): Secondary | ICD-10-CM | POA: Insufficient documentation

## 2012-07-09 DIAGNOSIS — R0989 Other specified symptoms and signs involving the circulatory and respiratory systems: Secondary | ICD-10-CM

## 2012-07-09 DIAGNOSIS — G4733 Obstructive sleep apnea (adult) (pediatric): Secondary | ICD-10-CM

## 2012-07-09 DIAGNOSIS — R0609 Other forms of dyspnea: Secondary | ICD-10-CM

## 2012-07-09 NOTE — Procedures (Signed)
NAME:  Destiny Robinson, Destiny Robinson NO.:  [id number]  MEDICAL RECORD NO.:  [id number]          PATIENT TYPE:  OUT  LOCATION:  SLEEP CENTER                 FACILITY:  [facility]  PHYSICIAN:  Zamantha Strebel D. Maple Hudson, MD, FCCP, FACPDATE OF BIRTH:  [birth year] 07, 1956  DATE OF STUDY:  07/07/2012                           NOCTURNAL POLYSOMNOGRAM  REFERRING PHYSICIAN:  Delaney Meigs, M.D.  INDICATION FOR STUDY:  Hypersomnia with sleep apnea.  EPWORTH SLEEPINESS SCORE:  8/24.  BMI 32, weight 177 pounds, height 62 inches, neck 15 inches.  MEDICATIONS:  Home medications are charted and reviewed.  SLEEP ARCHITECTURE:  Split study protocol.  During the diagnostic phase, total sleep time 202 minutes with sleep efficiency 96.4%.  Stage I was 6.9%, stage II 80.7%, stage III absent, REM 12.4% of total sleep time. Sleep latency 4.5 minutes, REM latency 147 minutes.  Awake after sleep onset 2.5 minutes.  Arousal index 5.  Bedtime medication:  Erythromycin.  RESPIRATORY DATA:  Split study protocol.  Apnea/hypopnea index (AHI) 14.3 per hour.  A total of 48 events was scored including 19 obstructive apneas, 7 central apneas, 22 hypopneas.  Events were not positional. REM AHI 52.8 per hour.  CPAP was titrated to 8 CWP, AHI 0 per hour.  She wore a large Eson mask with heated humidifier.  OXYGEN DATA:  Moderate snoring with oxygen desaturation to a nadir of 67% before CPAP.  With CPAP control, mean oxygen saturation held 92.2% on room air and snoring was prevented.  CARDIAC DATA:  Normal sinus rhythm.  MOVEMENT/PARASOMNIA:  No significant movement disturbance. Bathroom x1.  IMPRESSION/RECOMMENDATION: 1. Mild obstructive sleep apnea/hypopnea syndrome, AHI 14.3 per hour     with non-positional events.  Moderate snoring and oxygen     desaturation to a nadir of 67% on room air. 2. Successful CPAP titration to 8 CWP, AHI 0 per hour.  She wore a     large Eson mask with heated humidifier.  Snoring was  prevented and     mean oxygen saturation held 92.2% on room air.     Keland Peyton D. Maple Hudson, MD, [facility], FACP Diplomate, American Board of Sleep Medicine    CDY/MEDQ  D:  07/09/2012 11:27:56  T:  07/09/2012 13:50:57  Job:  [id number]

## 2012-11-11 ENCOUNTER — Ambulatory Visit (INDEPENDENT_AMBULATORY_CARE_PROVIDER_SITE_OTHER): Payer: Medicaid Other | Admitting: Internal Medicine

## 2012-11-11 ENCOUNTER — Ambulatory Visit (INDEPENDENT_AMBULATORY_CARE_PROVIDER_SITE_OTHER)
Admission: RE | Admit: 2012-11-11 | Discharge: 2012-11-11 | Disposition: A | Discharge status: Home / Self Care | Payer: Medicaid Other | Source: Ambulatory Visit | Attending: Internal Medicine | Admitting: Internal Medicine

## 2012-11-11 ENCOUNTER — Encounter: Payer: Self-pay | Admitting: Internal Medicine

## 2012-11-11 VITALS — BP 120/76 | HR 100 | Ht 62.0 in | Wt 199.4 lb

## 2012-11-11 DIAGNOSIS — G4733 Obstructive sleep apnea (adult) (pediatric): Secondary | ICD-10-CM

## 2012-11-11 DIAGNOSIS — R0600 Dyspnea, unspecified: Secondary | ICD-10-CM

## 2012-11-11 DIAGNOSIS — R0989 Other specified symptoms and signs involving the circulatory and respiratory systems: Secondary | ICD-10-CM

## 2012-11-11 DIAGNOSIS — R0609 Other forms of dyspnea: Secondary | ICD-10-CM

## 2012-11-11 NOTE — Progress Notes (Signed)
11/11/12- 57 yoF former smoker Dr Letta Median study attached to consult sheet; falls asleep not meaning to and stops breathing as told by her sister; had to have O2 bled through CPAP after recent surgery-no longer has O2. Also states that she feels liker her O2 levels drop when she walks. CPAP Advanced, 8 CWP.less sleepy with CPAP. NPSG 07/07/12- AHI 14.3 per hour. Body weight 177 pounds. Mild obstructive sleep apnea/hypoxia syndrome. CPAP titrated to 8. Bedtime between 9 and 10 PM, sleep latency 15 minutes, waking twice before up at 7 AM. Hospital for umbilical hernia repair. At that time desaturating and oxygen was added through her CPAP. Medical history of hypertension, followed by cardiology with atherosclerosis seen on CT scan. She denies MI or stroke. Diabetic with gastroparesis. Abdominal distention crowds breathing lying supine. No history of lung disease. Long history of second hand smoke. She quit smoking herself at age 57 but husband died of lung cancer.  Prior to Admission medications   Medication Sig Start Date End Date Taking? Authorizing Provider  acyclovir cream (ZOVIRAX) 5 % Apply 1 application topically every 3 (three) hours as needed. Fever Blisters 12/13/11 12/12/12 Yes Sorin Luanne Bras, MD  amLODipine (NORVASC) 5 MG tablet Take 5 mg by mouth every evening.   Yes Historical Provider, MD  Choline Fenofibrate (TRILIPIX) 135 MG capsule Take 135 mg by mouth daily.   Yes Historical Provider, MD  dexlansoprazole (DEXILANT) 60 MG capsule Take 60 mg by mouth daily.   Yes Historical Provider, MD  ezetimibe-simvastatin (VYTORIN) 10-40 MG per tablet Take 1 tablet by mouth at bedtime.   Yes Historical Provider, MD  fluconazole (DIFLUCAN) 100 MG tablet Take 100 mg by mouth at bedtime.    Yes Historical Provider, MD  glipiZIDE (GLUCOTROL XL) 5 MG 24 hr tablet Take 5 mg by mouth daily.   Yes Historical Provider, MD  HYDROcodone-acetaminophen (NORCO/VICODIN) 5-325 MG per tablet Take 1 tablet by mouth  every 4 (four) hours as needed.   Yes Historical Provider, MD  hyoscyamine (LEVSIN SL) 0.125 MG SL tablet Place 0.125 mg under the tongue every 4 (four) hours as needed. Stomach Cramps   Yes Historical Provider, MD  LORazepam (ATIVAN) 0.5 MG tablet Take 0.5 mg by mouth 3 (three) times daily as needed. FOR ANXIETY   Yes Historical Provider, MD  Pancrelipase, Lip-Prot-Amyl, (CREON) 24000 UNITS CPEP Take by mouth. 1-2 capsules with meals as directed   Yes Historical Provider, MD  potassium chloride (K-DUR) 10 MEQ tablet Take 10 mEq by mouth every morning.   Yes Historical Provider, MD  promethazine (PHENERGAN) 25 MG tablet Take 25 mg by mouth every 6 (six) hours as needed.   Yes Historical Provider, MD  pseudoephedrine (SUDAFED) 30 MG tablet Take 30 mg by mouth daily as needed. Congestion   Yes Historical Provider, MD  valsartan-hydrochlorothiazide (DIOVAN-HCT) 160-12.5 MG per tablet Take 1 tablet by mouth daily.   Yes Historical Provider, MD  venlafaxine (EFFEXOR) 75 MG tablet Take 75 mg by mouth 2 (two) times daily.   Yes Historical Provider, MD   Past Medical History  Diagnosis Date  . Hypertension   . Hypercholesterolemia   . Bronchitis   . Pancreatitis   . Complication of anesthesia 12/11/11    "angry, mean, hateful after" endoscopy & colonoscopy  . Asthma ~ 1991    "come and go"  . Pneumonia 06/2011; 07/2011  . Diabetes mellitus type 2, controlled   . GERD (gastroesophageal reflux disease)   . GI bleeding 12/21/11    ""  that's why I'm here"  . Hx of colonoscopy with polypectomy 12/11/11    "took out 7; couldn't get #8, that one was precancerous"  . Depression   . 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 Paralysis gastric    Past Surgical History  Procedure Laterality Date  . Cesarean section  1981; 1986  . Esophagogastroduodenoscopy  12/11/2011    Procedure: ESOPHAGOGASTRODUODENOSCOPY (EGD);  Surgeon: Barrie Folk, MD;  Location: [facility] ENDOSCOPY;  Service:  Endoscopy;  Laterality: N/A;  . Colonoscopy  12/11/2011    Procedure: COLONOSCOPY;  Surgeon: Barrie Folk, MD;  Location: [facility] ENDOSCOPY;  Service: Endoscopy;  Laterality: N/A;  . Abdominal hysterectomy  2002  . Cholecystectomy  ~ 2002  . Tonsillectomy and adenoidectomy  1988  . Appendectomy  ~ 2002    "w/hysterectomy"  . Colonoscopy  04/26/2012    Procedure: COLONOSCOPY;  Surgeon: Graylin Shiver, MD;  Location: [facility] ENDOSCOPY;  Service: Endoscopy;  Laterality: N/A;  apc   Family History  Problem Relation Age of Onset  . Colon cancer Sister    History   Social History  . Marital Status: Widowed    Spouse Name: N/A    Number of Children: N/A  . Years of Education: N/A   Occupational History  . Not on file.   Social History Main Topics  . Smoking status: Former Smoker -- 0.12 packs/day for 6 years    Types: Cigarettes    Quit date: 12/09/1996  . Smokeless tobacco: Never Used  . Alcohol Use: No  . Drug Use: No  . Sexually Active: No     Comment: complete hysterectomy   Other Topics Concern  . Not on file   Social History Narrative  . No narrative on file   ROS-see HPI Constitutional:   No-   weight loss, night sweats, fevers, chills, +fatigue, lassitude. HEENT:   No-  headaches, difficulty swallowing, tooth/dental problems, sore throat,       No-  sneezing, itching, ear ache, +nasal congestion, post nasal drip,  CV:  No-   chest pain, orthopnea, PND, swelling in lower extremities, anasarca,                                  dizziness, +palpitations Resp: + shortness of breath with exertion or at rest.              No-   productive cough,  No non-productive cough,  No- coughing up of blood.              No-   change in color of mucus.  No- wheezing.   Skin: No-   rash or lesions. GI:  No-   heartburn, indigestion, abdominal pain, nausea, vomiting, diarrhea,                 change in bowel habits, loss of appetite GU: No-   dysuria, change in color of urine, no urgency or  frequency.  No- flank pain. MS:  No-   joint pain or swelling.  No- decreased range of motion.  No- back pain. Neuro-     nothing unusual Psych:  No- change in mood or affect. + depression or anxiety.  No memory loss.  OBJ- Physical Exam General- Alert, Oriented, Affect-appropriate, Distress- none acute Skin- rash-none, lesions- none, excoriation- none Lymphadenopathy- none Head- atraumatic  Eyes- Gross vision intact, PERRLA, conjunctivae and secretions clear            Ears- Hearing, canals-normal            Nose- Clear, no-Septal dev, mucus, polyps, erosion, perforation             Throat- Mallampati III , mucosa clear , drainage- none, tonsils- atrophic. Upper denture, lower missing teeth. Neck- flexible , trachea midline, no stridor , thyroid nl, carotid no bruit Chest - symmetrical excursion , unlabored           Heart/CV- RRR , no murmur , no gallop  , no rub, nl s1 s2                           - JVD- none , edema- none, stasis changes- none, varices- none           Lung- clear to P&A, wheeze- none, cough- none , dullness-none, rub- none           Chest wall-  Abd- tender-no, distended-no, bowel sounds-present, HSM- no Br/ Gen/ Rectal- Not done, not indicated Extrem- cyanosis- none, clubbing, none, atrophy- none, strength- nl Neuro- grossly intact to observation

## 2012-11-11 NOTE — Patient Instructions (Addendum)
Order- CXR dx dyspnea  Order- schedule PFT, 6 MWT   Dx dyspnea  Order- DME Advanced  ONOX on CPAP with room air    Dx OSA

## 2012-11-14 ENCOUNTER — Telehealth: Payer: Self-pay | Admitting: Internal Medicine

## 2012-11-15 NOTE — Telephone Encounter (Signed)
Order was given to [email or web address]  to forward to the [place] office Tobe Sos

## 2012-11-16 NOTE — Progress Notes (Signed)
Quick Note:  Pt aware of results. ______ 

## 2012-11-19 DIAGNOSIS — G4733 Obstructive sleep apnea (adult) (pediatric): Secondary | ICD-10-CM | POA: Insufficient documentation

## 2012-11-19 NOTE — Assessment & Plan Note (Signed)
Compliance and control and seems good but she still notices excessive daytime sleepiness. Question role ofAtivan given for her GI symptoms. Plan-overnight oximetry on CPAP

## 2012-11-19 NOTE — Assessment & Plan Note (Signed)
She notices dyspnea. Some of this is hypoventilation related to abdominal distention and crowding of diaphragm because of her diabetic gastroparesis as she describes. There seems to be increased awareness of dyspnea with exertion and she was given oxygen through her CPAP during her hospital stay. Former smoker with long secondhand exposure. Plan-PFT, chest x-ray, 6 minute walk test

## 2012-12-12 ENCOUNTER — Ambulatory Visit (INDEPENDENT_AMBULATORY_CARE_PROVIDER_SITE_OTHER): Payer: Medicaid Other | Admitting: Internal Medicine

## 2012-12-12 ENCOUNTER — Encounter: Payer: Self-pay | Admitting: Internal Medicine

## 2012-12-12 VITALS — BP 120/76 | HR 110 | Ht 62.0 in | Wt 205.6 lb

## 2012-12-12 DIAGNOSIS — R0902 Hypoxemia: Secondary | ICD-10-CM

## 2012-12-12 DIAGNOSIS — R0609 Other forms of dyspnea: Secondary | ICD-10-CM

## 2012-12-12 DIAGNOSIS — R0600 Dyspnea, unspecified: Secondary | ICD-10-CM

## 2012-12-12 DIAGNOSIS — G4733 Obstructive sleep apnea (adult) (pediatric): Secondary | ICD-10-CM

## 2012-12-12 DIAGNOSIS — R0989 Other specified symptoms and signs involving the circulatory and respiratory systems: Secondary | ICD-10-CM

## 2012-12-12 LAB — PULMONARY FUNCTION TEST

## 2012-12-12 MED ORDER — ALBUTEROL SULFATE HFA 108 (90 BASE) MCG/ACT IN AERS: 2.0000 | INHALATION_SPRAY | Freq: Four times a day (QID) | RESPIRATORY_TRACT | Status: DC | PRN

## 2012-12-12 NOTE — Progress Notes (Signed)
PFT done today. 

## 2012-12-12 NOTE — Patient Instructions (Addendum)
Script sent for albuterol HFA rescue inhaler to use only if needed for chest tightness, wheeze  Order- DME Advanced- Add O2 for sleep, 2 L/Min, through CPAP     Dx hypoxemia, OSA

## 2012-12-12 NOTE — Progress Notes (Signed)
11/11/12- 57 yoF former smoker Dr Letta Median study attached to consult sheet; falls asleep not meaning to and stops breathing as told by her sister; had to have O2 bled through CPAP after recent surgery-no longer has O2. Also states that she feels like her O2 levels drop when she walks. CPAP Advanced, 8 CWP.less sleepy with CPAP. NPSG 07/07/12- AHI 14.3 per hour. Body weight 177 pounds. Mild obstructive sleep apnea/hypoxia syndrome. CPAP titrated to 8. Bedtime between 9 and 10 PM, sleep latency 15 minutes, waking twice before up at 7 AM. Hospital for umbilical hernia repair. At that time desaturating and oxygen was added through her CPAP. Medical history of hypertension, followed by cardiology with atherosclerosis seen on CT scan. She denies MI or stroke. Diabetic with gastroparesis. Abdominal distention crowds breathing lying supine. No history of lung disease. Long history of second hand smoke. She quit smoking herself at age 51 but husband died of lung cancer.  Dec 29, 2012- 57 yo F former smoker followed for OSA, dyspnea Daughter here  On oxygen now. Being evaluated for pancreatitis. She felt "just tired" with some cough and wheeze. Dyspnea on exertion walking 1/2 mile. Wakes in the morning with dry cough. Chest tightness without pain but sometimes with wheeze. Continues CPAP 8/Advanced, all night, every night. 29-Dec-2012-93%, 96%, 97%, 495 m. Noted some chest tightness while walking, resolved with rest. No oxygen limitation. PFT 12-29-2012-normal spirometry airflow, normal lung volumes, normal diffusion. Small airway flows improved with bronchodilator. FVC 2.54/85%, FEV1 2.16/97%, FEV1/FVC 0.85, FEF 25-75% 2.79/105%, TLC 95%, DLCO 88%. ONOX on CPAP 8/ Room Air 11/21/12- significant desaturation- less than or equal to 88% for over one hour during the night. CXR 11/16/12- IMPRESSION:  No acute cardiopulmonary abnormality.  Original Report Authenticated By: Erskine Speed, M.D.   ROS-see  HPI Constitutional:   No-   weight loss, night sweats, fevers, chills, +fatigue, lassitude. HEENT:   No-  headaches, difficulty swallowing, tooth/dental problems, sore throat,       No-  sneezing, itching, ear ache, +nasal congestion, post nasal drip,  CV:  No-   chest pain, orthopnea, PND, swelling in lower extremities, anasarca,                                  dizziness, +palpitations Resp: + shortness of breath with exertion or at rest.              No-   productive cough,  No non-productive cough,  No- coughing up of blood.              No-   change in color of mucus.  No- wheezing.   Skin: No-   rash or lesions. GI:  No-   heartburn, indigestion, abdominal pain, nausea, vomiting,  GU: No-   dysuria, change in color of urine, no urgency or frequency.  No- flank pain. MS:  No-   joint pain or swelling.   Neuro-     nothing unusual Psych:  No- change in mood or affect. + depression or anxiety.  No memory loss.  OBJ- Physical Exam General- Alert, Oriented, Affect-appropriate, Distress- none acute. Overweight Skin- rash-none, lesions- none, excoriation- none Lymphadenopathy- none Head- atraumatic            Eyes- Gross vision intact, PERRLA, conjunctivae and secretions clear            Ears- Hearing, canals-normal  Nose- Clear, no-Septal dev, mucus, polyps, erosion, perforation             Throat- Mallampati III , mucosa clear , drainage- none, tonsils- atrophic. Upper denture, lower missing teeth. Neck- flexible , trachea midline, no stridor , thyroid nl, carotid no bruit Chest - symmetrical excursion , unlabored           Heart/CV- RRR , no murmur , no gallop  , no rub, nl s1 s2                           - JVD- none , edema- none, stasis changes- none, varices- none           Lung- clear to P&A, wheeze- none, cough- none , dullness-none, rub- none           Chest wall-  Abd-  Br/ Gen/ Rectal- Not done, not indicated Extrem- cyanosis- none, clubbing, none, atrophy- none,  strength- nl Neuro- grossly intact to observation

## 2012-12-14 NOTE — Assessment & Plan Note (Addendum)
Plan-normal pulmonary function and oxygenation makes a pulmonary basis for her dyspnea on exertion unlikely. Cardiac problems aren't excluded but most of this will be deconditioning and obesity with hypoventilation. Treat as mild asthma with a rescue inhaler for use before exercise.

## 2012-12-14 NOTE — Assessment & Plan Note (Addendum)
Good compliance and control with CPAP Persistent oxygen desaturation during sleep probably reflects obesity with hypoventilation. Plan-Advanced will add oxygen at 2 L during sleep

## 2012-12-14 NOTE — Progress Notes (Signed)
Documentation for 6 minute walk test 

## 2012-12-22 ENCOUNTER — Encounter: Payer: Self-pay | Admitting: Internal Medicine

## 2013-01-05 ENCOUNTER — Telehealth: Payer: Self-pay | Admitting: Internal Medicine

## 2013-01-05 MED ORDER — AZITHROMYCIN 250 MG PO TABS: ORAL_TABLET | ORAL | Status: DC

## 2013-01-05 NOTE — Telephone Encounter (Signed)
lmomtcb x1 

## 2013-01-05 NOTE — Telephone Encounter (Signed)
I spoke with pt and she c/o PND, nasal congestion, sneezing, SOB, cough w/ green phlem, wheezing, cehst tx, HA, facial pressure x 1 week. No f/c/s/n/v. Nothing OTC. She is requesting further recs. Please advise Dr. Maple Hudson thanks Last OV 12/12/12 Pending 04/13/14 Allergies  Allergen Reactions  . Codeine Hypertension  . Penicillins Anaphylaxis  . Reglan (Metoclopramide) Swelling  . Sulfa Antibiotics Hives    Pt states "some of it, not all of it" give her a reaction; "hives all over"  . Xifaxan (Rifaximin) Hives and Shortness Of Breath    SOB  . Zocor (Simvastatin) Anaphylaxis and Hives

## 2013-01-05 NOTE — Telephone Encounter (Signed)
Pt aware of CDY recs. She stated she is not having a lot of wheezing. rx has been sent. Nothing further was needed

## 2013-01-05 NOTE — Telephone Encounter (Signed)
Per CY-Zpak #1 take as directed no refills, Mucinex D; if a lot of wheeze she may need prednisone.

## 2013-01-05 NOTE — Telephone Encounter (Signed)
Pt returned call. Destiny Robinson  

## 2013-03-08 IMAGING — CT CT ANGIO CHEST
1 of 2 series · 19 of 32 positions shown · IV contrast (APPLIED)
Comparison: Chest radiograph 07/02/2011

CLINICAL DATA: Shortness of breath on exertion with right-sided
chest pain

CT ANGIOGRAPHY CHEST WITH CONTRAST
TECHNIQUE: Multidetector CT imaging of the chest was performed
using the standard protocol during bolus administration of
intravenous contrast.  Multiplanar CT image reconstructions
including MIPs were obtained to evaluate the vascular anatomy.
Contrast:  80 ml [id number]-P99

[Series 7: thins for pacs · axial · 0.77mm/px · z∈[+1112,+1341]mm · 19 of 255 slices shown]
[im 13/255  lung]
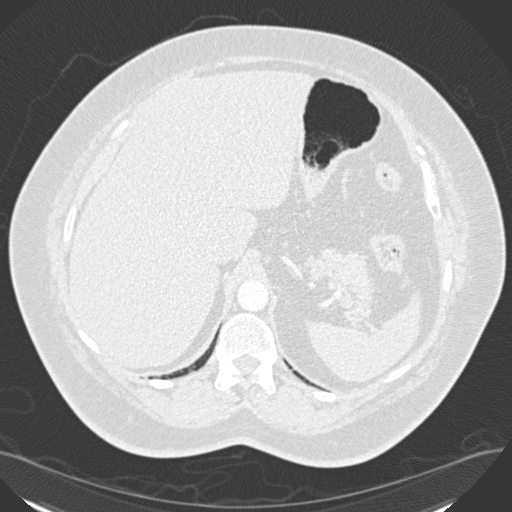
[im 26/255  mediastinal]
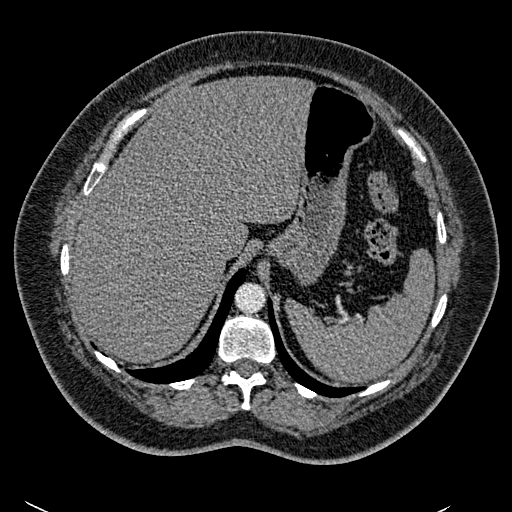
[im 39/255  lung]
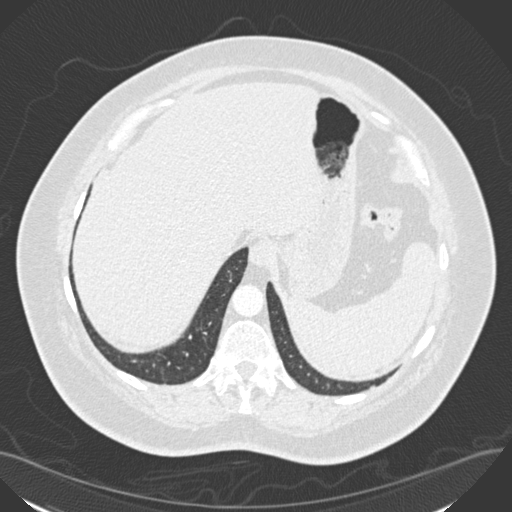
[im 64/255  mediastinal]
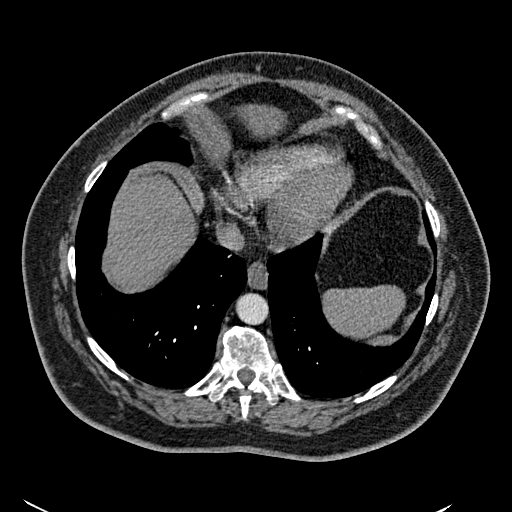
[im 77/255  lung]
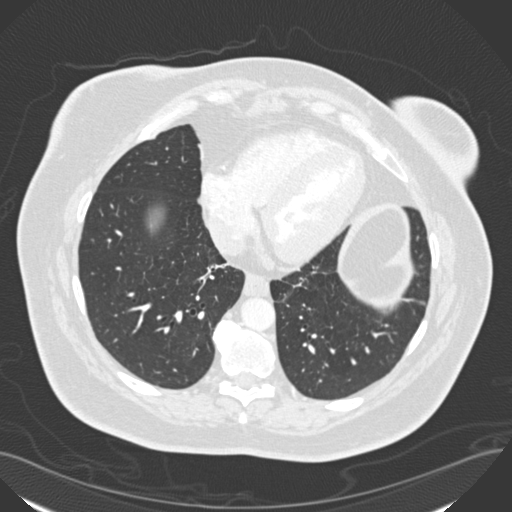
[im 85/255  mediastinal]
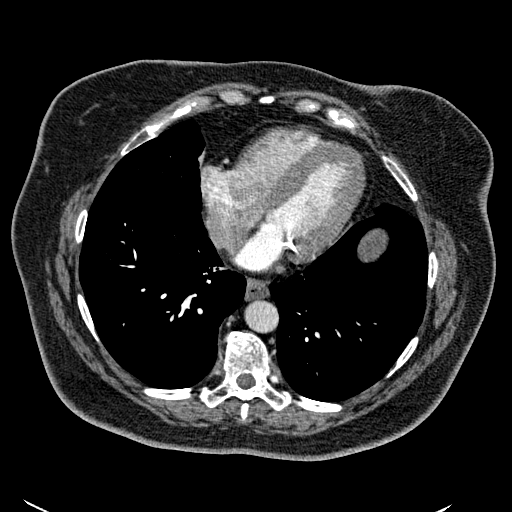
[im 89/255  lung]
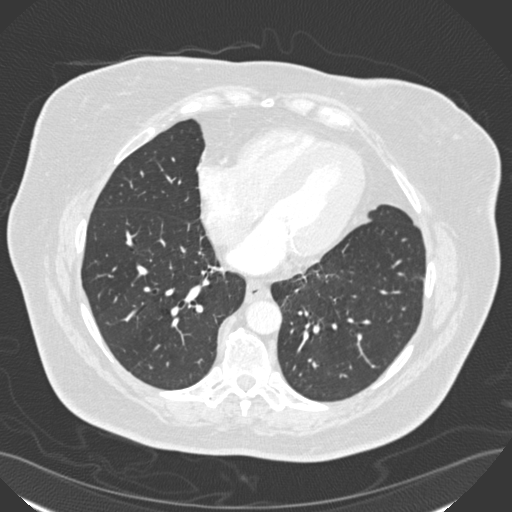
[im 102/255  mediastinal]
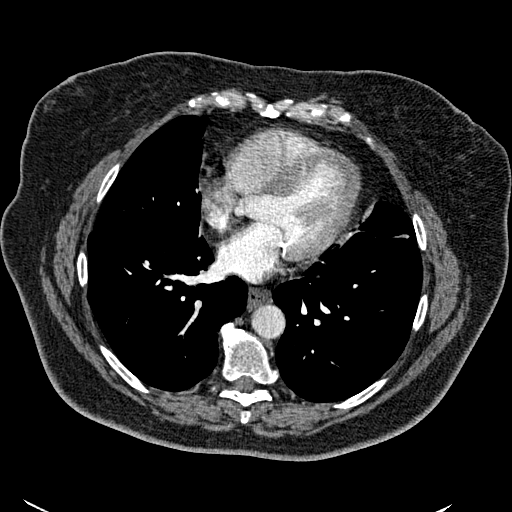
[im 115/255  lung]
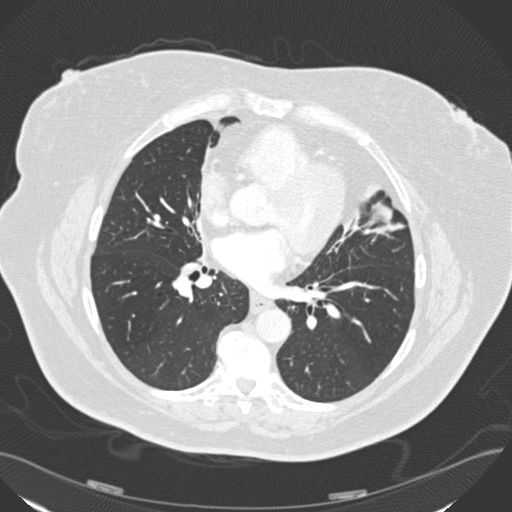
[im 128/255  mediastinal]
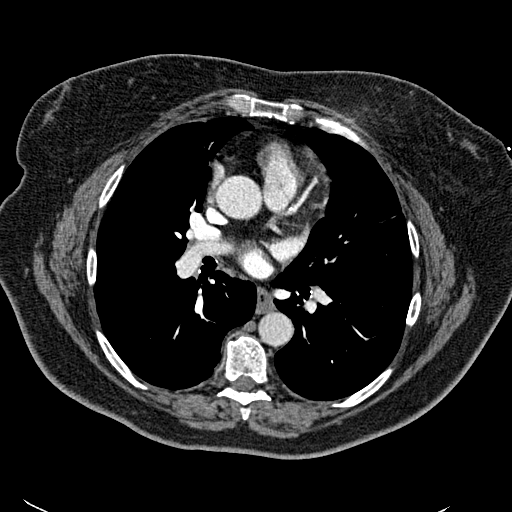
[im 140/255  lung]
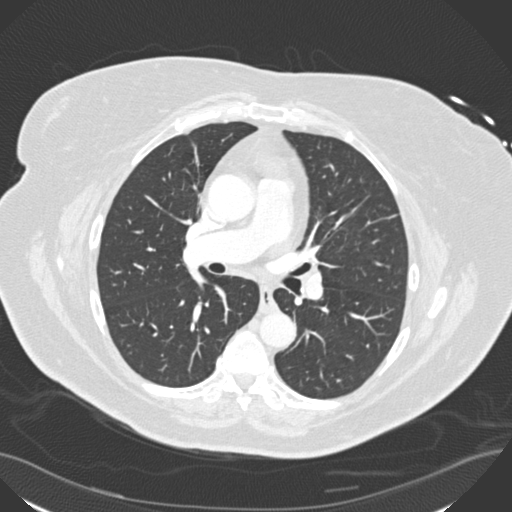
[im 153/255  mediastinal]
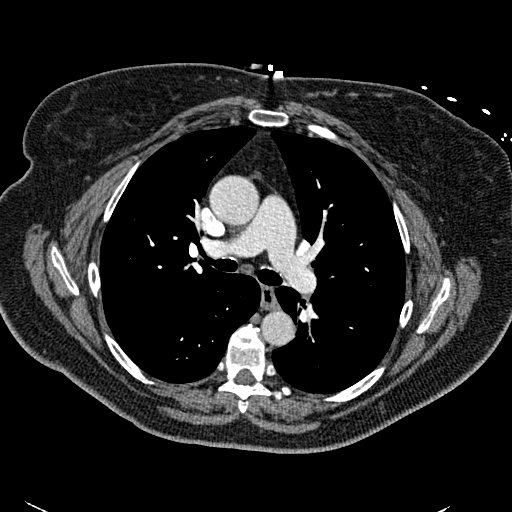
[im 166/255  lung]
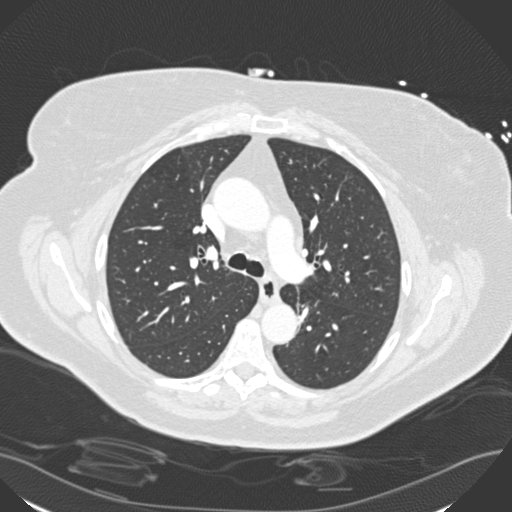
[im 170/255  mediastinal]
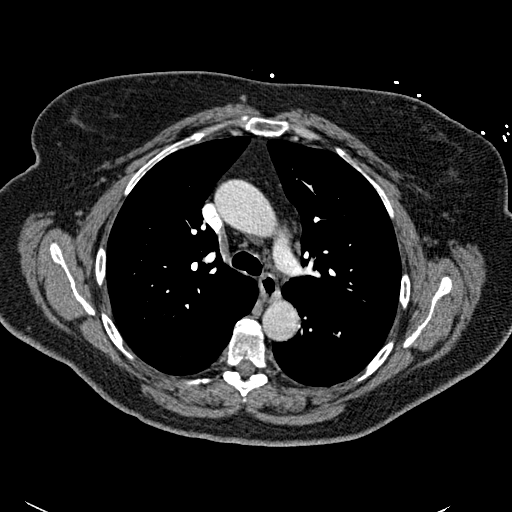
[im 178/255  lung]
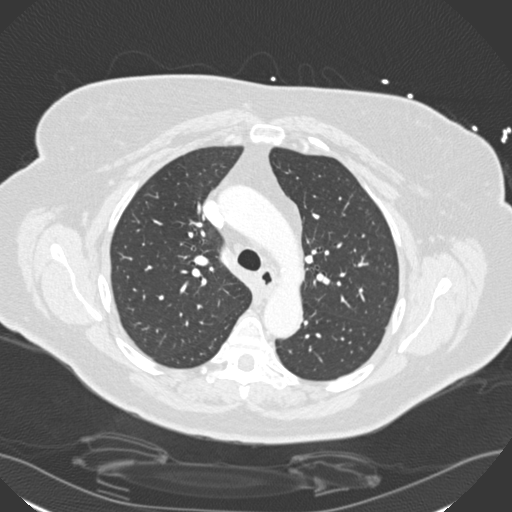
[im 191/255  mediastinal]
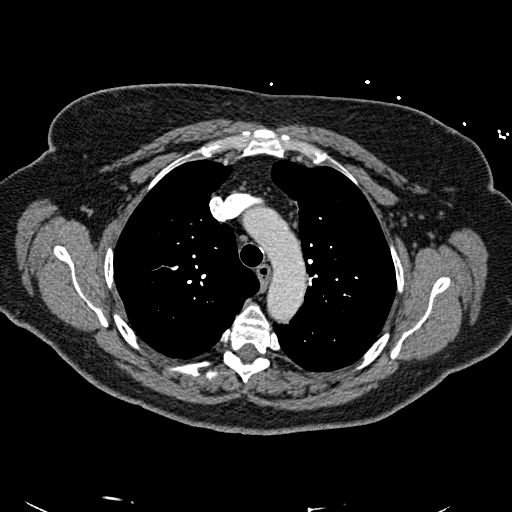
[im 216/255  lung]
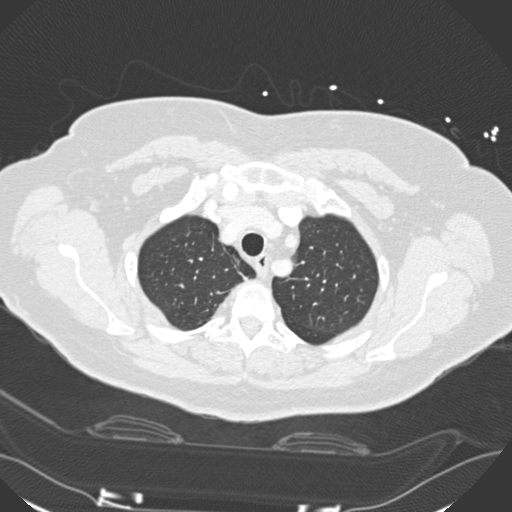
[im 229/255  mediastinal]
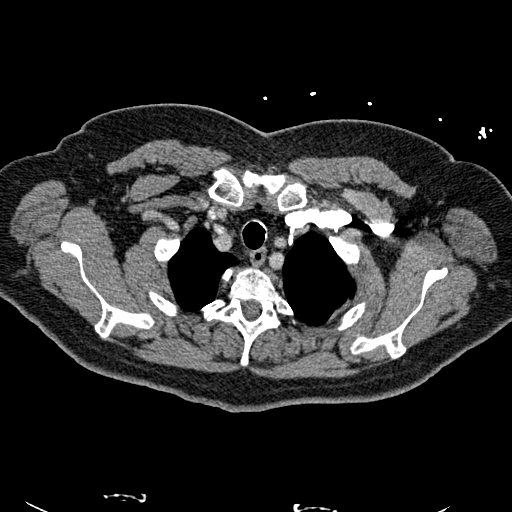
[im 242/255  lung]
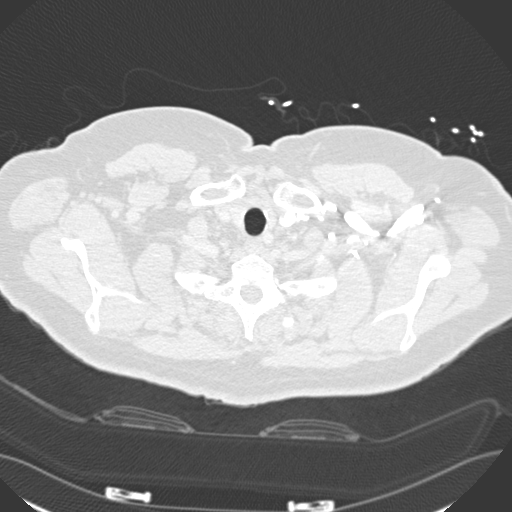

[19 of 32 positions shown; findings below may reference images not displayed]

FINDINGS: Technically satisfactory evaluation of the pulmonary
arterial tree.  No focal filling defects are identified to suggest
pulmonary embolism.  The thoracic aorta is normal in caliber.
Negative for dissection.  Heart size is upper normal to mildly
prominent, with slight biatrial enlargement. There is some
calcification in the region of the mitral valve.  Negative for
pleural pericardial effusion.  Negative for lymphadenopathy.
Atherosclerotic calcification of the left coronary artery noted.

There is linear atelectasis or scarring in the lingula.  Otherwise,
the lungs are well expanded and clear.  The trachea mainstem
bronchi are patent.  The bony thorax is intact.  Vertebral bodies
are normal in height and alignment.

Visualized portion of the upper abdomen is unremarkable.

Soft tissues of the breasts appear normal by CT.

Review of the MIP images confirms the above findings.
IMPRESSION: 1.  Negative for pulmonary embolism.
2.  No acute findings in the lungs.  Minimal scar or atelectasis in
the lingula.
3.  Borderline cardiomegaly with slight biatrial prominence.
Calcification in the region of the mitral valve noted.  Cannot
exclude mitral valve stenosis.
4.  Coronary artery atherosclerotic calcification.

## 2013-03-08 IMAGING — CR DG CHEST 2V
2 series · 2 of 2 positions shown · non-contrast
Comparison: Chest radiograph 07/05/2009

CLINICAL DATA: Shortness of breath, chest pain

CHEST - 2 VIEW

[w chest lat]
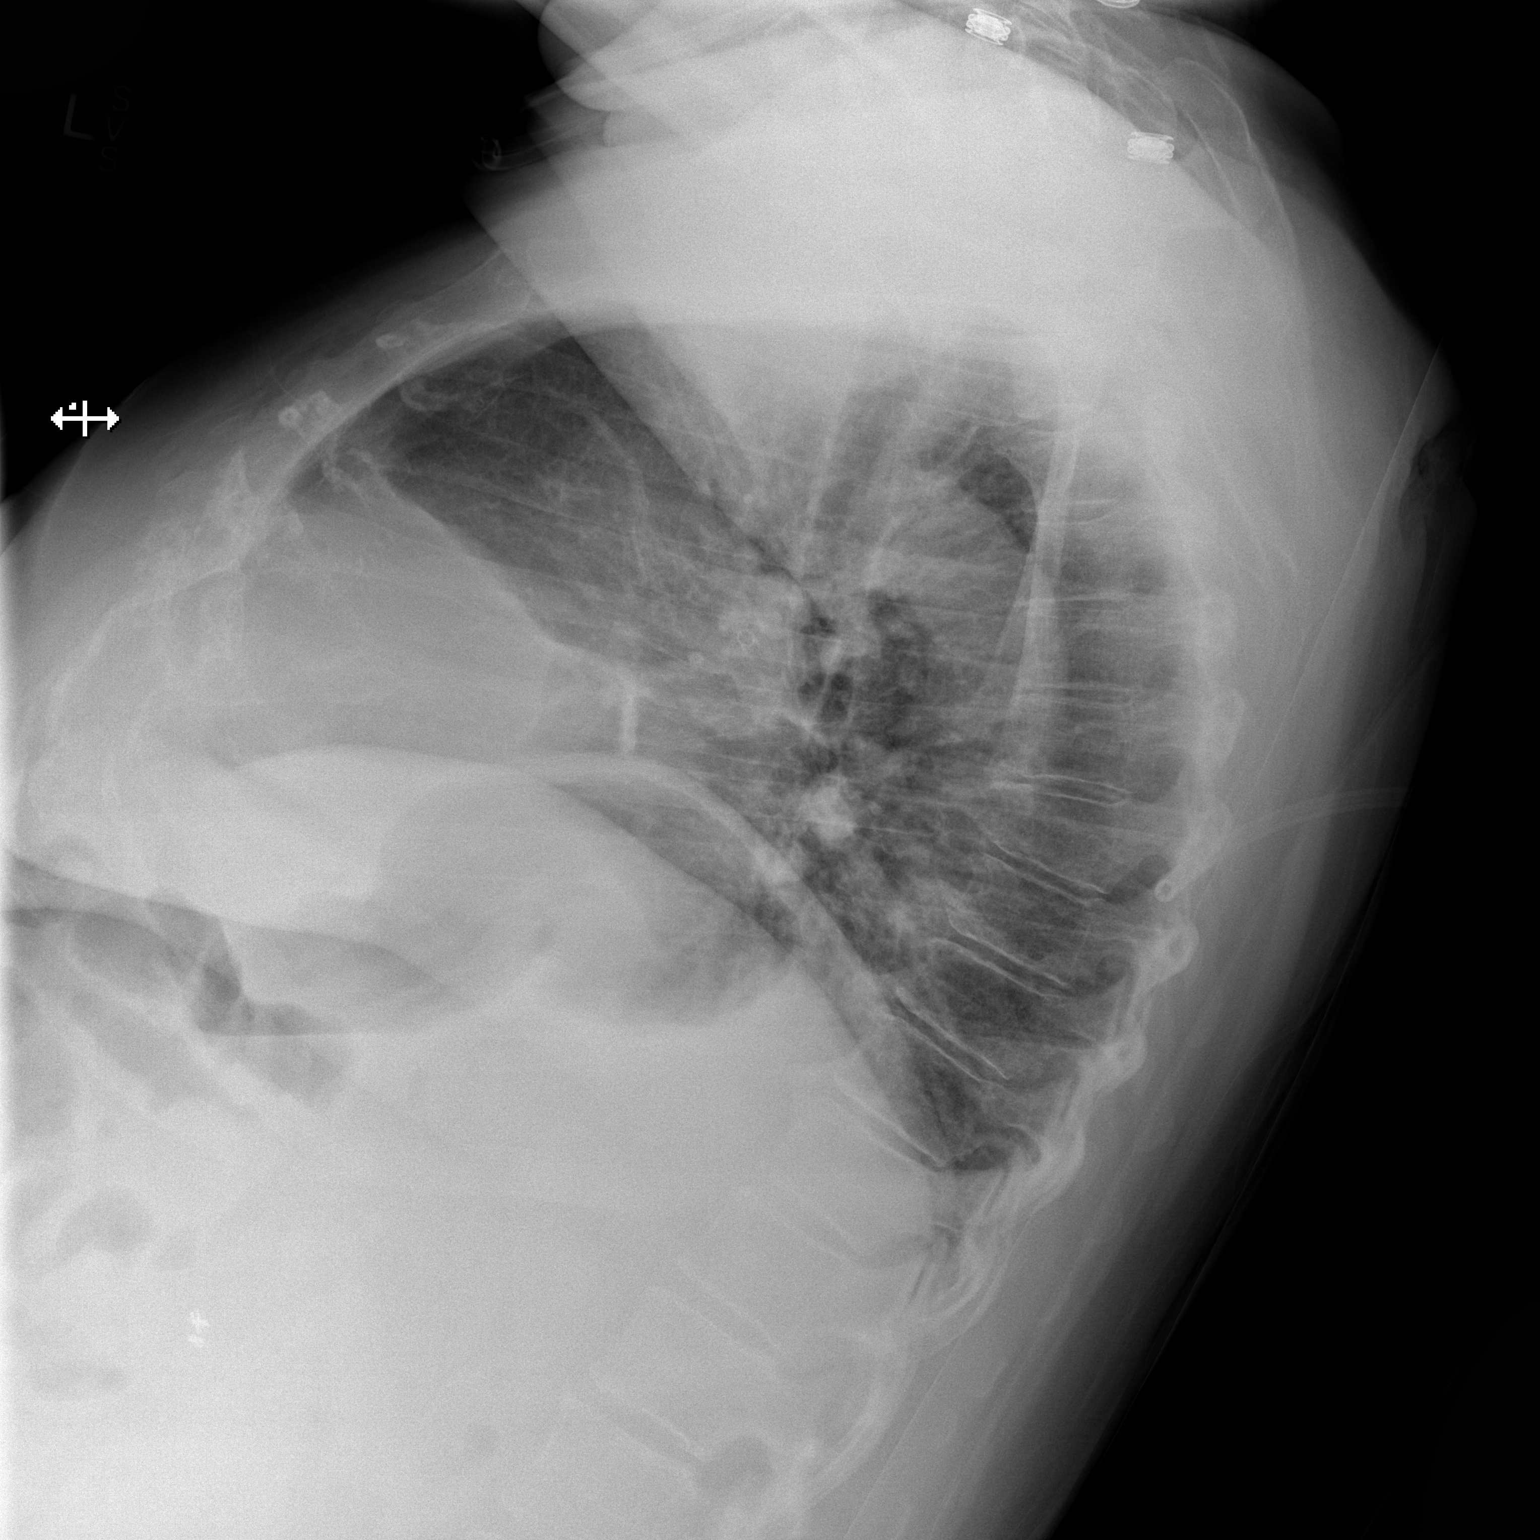

[x chest ap]
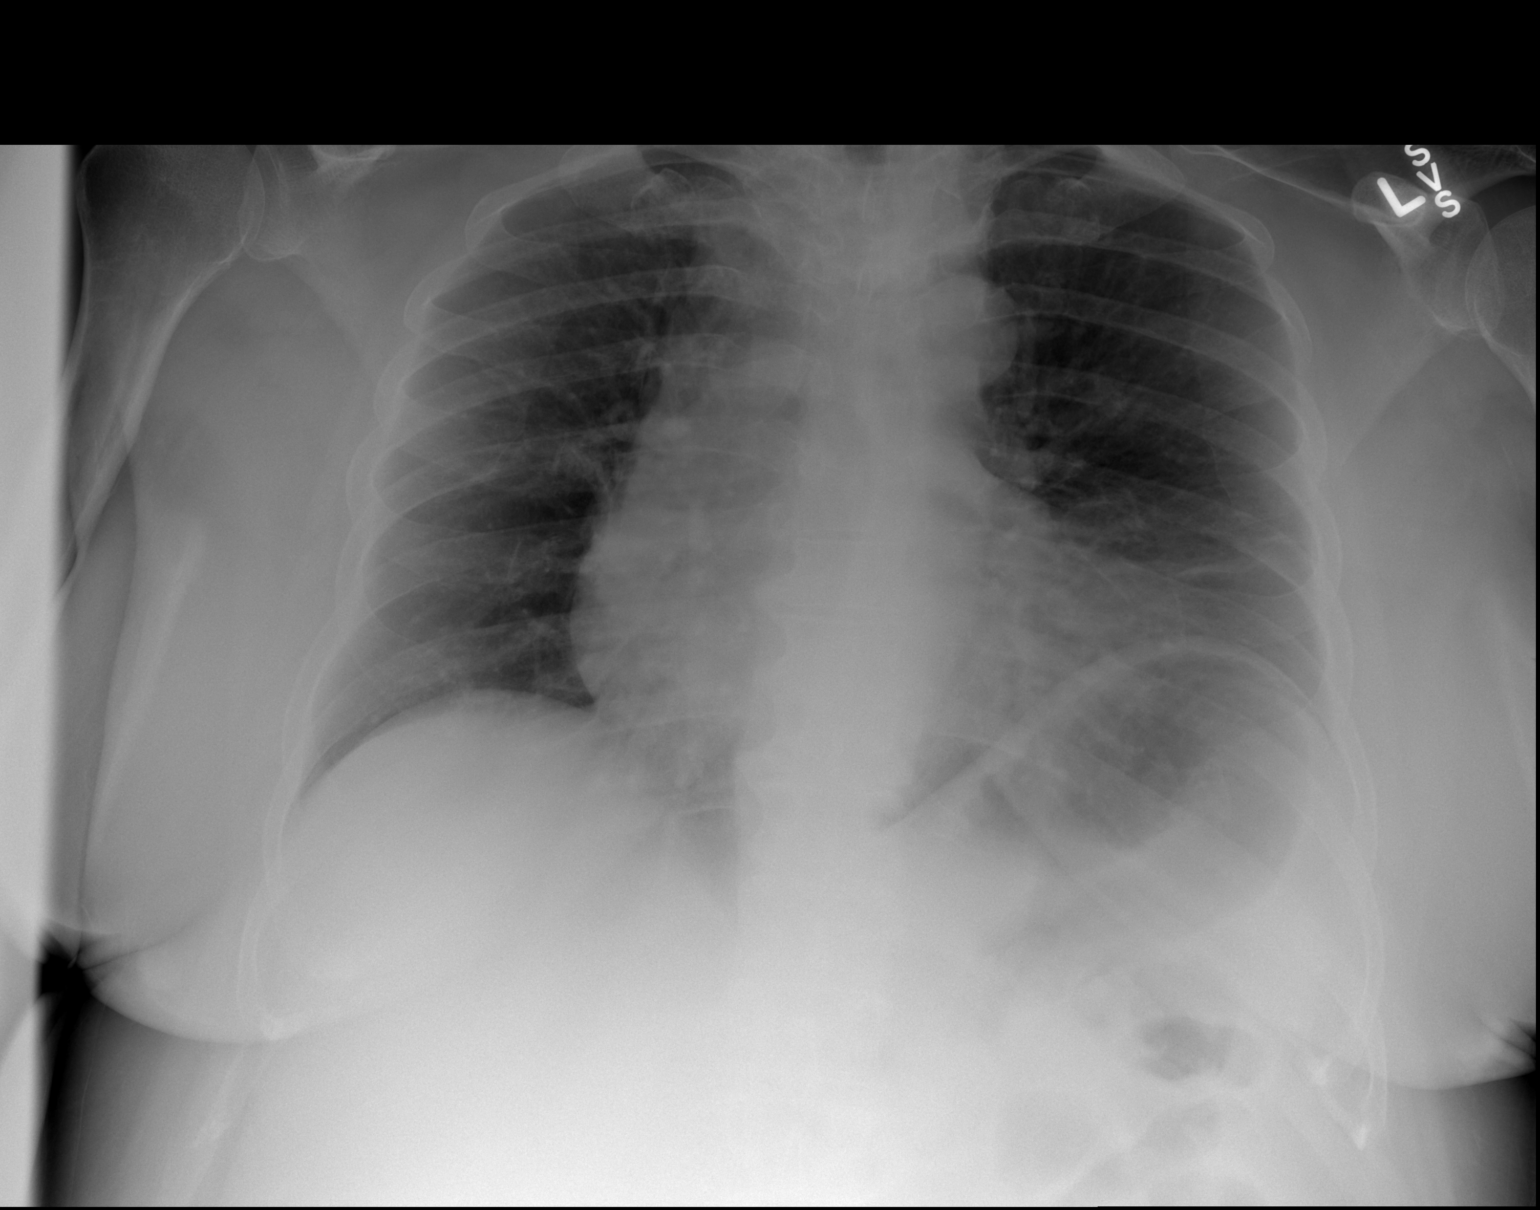

[2 of 2 positions shown; findings below may reference images not displayed]

FINDINGS: Heart size is upper normal in stable.  The lung volumes
are very low.  There is perihilar atelectasis on the left.
Peribronchial thickening is present.  No airspace disease,
effusion, or pneumothorax.  The trachea is midline.  No acute bony
abnormality.
IMPRESSION: Low lung volumes are with peribronchial thickening and minimal left
basilar atelectasis.

Borderline cardiomegaly.

## 2013-04-05 ENCOUNTER — Encounter: Payer: Self-pay | Admitting: Internal Medicine

## 2013-04-05 ENCOUNTER — Ambulatory Visit (INDEPENDENT_AMBULATORY_CARE_PROVIDER_SITE_OTHER): Payer: Medicaid Other | Admitting: Internal Medicine

## 2013-04-05 VITALS — BP 146/84 | HR 109 | Ht 62.0 in | Wt 208.0 lb

## 2013-04-05 DIAGNOSIS — G4733 Obstructive sleep apnea (adult) (pediatric): Secondary | ICD-10-CM

## 2013-04-05 DIAGNOSIS — K219 Gastro-esophageal reflux disease without esophagitis: Secondary | ICD-10-CM

## 2013-04-05 DIAGNOSIS — R0989 Other specified symptoms and signs involving the circulatory and respiratory systems: Secondary | ICD-10-CM

## 2013-04-05 MED ORDER — FLUTICASONE FUROATE-VILANTEROL 100-25 MCG/INH IN AEPB: 1.0000 | INHALATION_SPRAY | Freq: Every day | RESPIRATORY_TRACT | Status: DC

## 2013-04-05 NOTE — Patient Instructions (Addendum)
Continue Dexilant each morning  Add otc Pepcid/ famotadine  10  Or 20 mg every evening before supper. Try this for at least 2 weeks. See if it helps the cough  Sample Breo Ellipta     1 puff then rinse mouth, one time each day

## 2013-04-05 NOTE — Progress Notes (Signed)
11/11/12- 88 yoF former smoker Dr Letta Median study attached to consult sheet; falls asleep not meaning to and stops breathing as told by her sister; had to have O2 bled through CPAP after recent surgery-no longer has O2. Also states that she feels like her O2 levels drop when she walks. CPAP Advanced, 8 CWP.less sleepy with CPAP. NPSG 07/07/12- AHI 14.3 per hour. Body weight 177 pounds. Mild obstructive sleep apnea/hypoxia syndrome. CPAP titrated to 8. Bedtime between 9 and 10 PM, sleep latency 15 minutes, waking twice before up at 7 AM. Hospital for umbilical hernia repair. At that time desaturating and oxygen was added through her CPAP. Medical history of hypertension, followed by cardiology with atherosclerosis seen on CT scan. She denies MI or stroke. Diabetic with gastroparesis. Abdominal distention crowds breathing lying supine. No history of lung disease. Long history of second hand smoke. She quit smoking herself at age 55 but husband died of lung cancer.  2012/12/28- 51 yo F former smoker followed for OSA, dyspnea Daughter here  On oxygen now. Being evaluated for pancreatitis. She felt "just tired" with some cough and wheeze. Dyspnea on exertion walking 1/2 mile. Wakes in the morning with dry cough. Chest tightness without pain but sometimes with wheeze. Continues CPAP 8/Advanced, all night, every night. 12-28-2012-93%, 96%, 97%, 495 m. Noted some chest tightness while walking, resolved with rest. No oxygen limitation. PFT 12-28-2012-normal spirometry airflow, normal lung volumes, normal diffusion. Small airway flows improved with bronchodilator. FVC 2.54/85%, FEV1 2.16/97%, FEV1/FVC 0.85, FEF 25-75% 2.79/105%, TLC 95%, DLCO 88%. ONOX on CPAP 8/ Room Air 11/21/12- significant desaturation- less than or equal to 88% for over one hour during the night. CXR 11/16/12- IMPRESSION:  No acute cardiopulmonary abnormality.  Original Report Authenticated By: Erskine Speed, M.D.  04/05/13- 67 yo F former  smoker followed for OSA, dyspnea, OHS    Sister here Short of breath started mid-may, coming and going, but worsened in June,hoarseness, worsened, coughing, no production except in the mornings, greenish yellow.  nasal canula with cpap 8/ Advanced and O2 2L - wearing it every night, no problems with pressure.  Hoarseness and dyspnea, and go. On Dexilant for known reflux and peptic ulcer. Discussed relation to hoarseness. Uses rescue inhaler at least once daily, but not wheezing.  ROS-see HPI Constitutional:   No-   weight loss, night sweats, fevers, chills, +fatigue, lassitude. HEENT:   No-  headaches, difficulty swallowing, tooth/dental problems, sore throat,       No-  sneezing, itching, ear ache, +nasal congestion, post nasal drip,  CV:  No-   chest pain, orthopnea, PND, swelling in lower extremities, anasarca,                                  dizziness, +palpitations Resp: + shortness of breath with exertion or at rest.              No-   productive cough,  No non-productive cough,  No- coughing up of blood.              No-   change in color of mucus.  No- wheezing.   Skin: No-   rash or lesions. GI:  + heartburn, no-indigestion, abdominal pain, nausea, vomiting,  GU: No-   dysuria, change in color of urine, no urgency or frequency.  No- flank pain. MS:  No-   joint pain or swelling.   Neuro-  nothing unusual Psych:  No- change in mood or affect. + depression or anxiety.  No memory loss.  OBJ- Physical Exam General- Alert, Oriented, Affect-appropriate, Distress- none acute. Overweight Skin- rash-none, lesions- none, excoriation- none Lymphadenopathy- none Head- atraumatic            Eyes- Gross vision intact, PERRLA, conjunctivae and secretions clear            Ears- Hearing, canals-normal            Nose- Clear, no-Septal dev, mucus, polyps, erosion, perforation             Throat- Mallampati III , mucosa clear , drainage- none, tonsils- atrophic. +Upper denture, lower missing  teeth. Neck- flexible , trachea midline, no stridor , thyroid nl, carotid no bruit Chest - symmetrical excursion , unlabored           Heart/CV- RRR , no murmur , no gallop  , no rub, nl s1 s2                           - JVD- none , edema- none, stasis changes- none, varices- none           Lung- clear to P&A, wheeze- none, cough- none , dullness-none, rub- none           Chest wall-  Abd-  Br/ Gen/ Rectal- Not done, not indicated Extrem- +boot right foot Neuro- grossly intact to observation

## 2013-04-06 ENCOUNTER — Other Ambulatory Visit (HOSPITAL_COMMUNITY): Payer: Self-pay | Admitting: Family Medicine

## 2013-04-06 DIAGNOSIS — Z139 Encounter for screening, unspecified: Secondary | ICD-10-CM

## 2013-04-11 ENCOUNTER — Telehealth: Payer: Self-pay | Admitting: Internal Medicine

## 2013-04-11 MED ORDER — FLUTICASONE FUROATE-VILANTEROL 100-25 MCG/INH IN AEPB: 1.0000 | INHALATION_SPRAY | Freq: Every day | RESPIRATORY_TRACT | Status: DC

## 2013-04-11 NOTE — Telephone Encounter (Signed)
Please advise if okay to give RX:  Patient Instructions    Continue Dexilant each morning  Add otc Pepcid/ famotadine 10 Or 20 mg every evening before supper. Try this for at least 2 weeks. See if it helps the cough  Sample Breo Ellipta 1 puff then rinse mouth, one time each day    Thanks.

## 2013-04-11 NOTE — Telephone Encounter (Signed)
Per CY---  #1  1 puff and rinse mouth once daily and ok to refill prn.    Called and spoke with pt and she is aware of rx that has been sent in to her pharmacy. Nothing further is needed.

## 2013-04-13 ENCOUNTER — Ambulatory Visit: Payer: Medicaid Other | Admitting: Internal Medicine

## 2013-04-18 ENCOUNTER — Telehealth: Payer: Self-pay | Admitting: Internal Medicine

## 2013-04-18 MED ORDER — MOMETASONE FURO-FORMOTEROL FUM 100-5 MCG/ACT IN AERO: 2.0000 | INHALATION_SPRAY | Freq: Two times a day (BID) | RESPIRATORY_TRACT | Status: DC

## 2013-04-18 NOTE — Telephone Encounter (Signed)
Pt advised and samples left at front. Carron Curie, CMA

## 2013-04-18 NOTE — Telephone Encounter (Signed)
Dr. Maple Hudson please advise if you want [place] to initiate PA for the breo and if so may pt have a sample? Thanks Pt last OV 04/05/13 Pending 08/10/2013  Allergies  Allergen Reactions  . Codeine Hypertension  . Penicillins Anaphylaxis  . Reglan (Metoclopramide) Swelling  . Sulfa Antibiotics Hives    Pt states "some of it, not all of it" give her a reaction; "hives all over"  . Xifaxan (Rifaximin) Hives and Shortness Of Breath    SOB  . Zocor (Simvastatin) Anaphylaxis and Hives

## 2013-04-18 NOTE — Telephone Encounter (Signed)
Breo prior auth won't be approved unless we demonstrate lack of benefit from cheaper meds. Recommend trial sample Dulera 100  2 puffs then rinse mouth, twice daily.

## 2013-04-22 DIAGNOSIS — K219 Gastro-esophageal reflux disease without esophagitis: Secondary | ICD-10-CM | POA: Insufficient documentation

## 2013-04-22 NOTE — Assessment & Plan Note (Signed)
MWT 12/12/12- 93%, 96%, 97%, 495 m. Noted some chest tightness while walking, resolved with rest. No oxygen limitation. ONOX on CPAP 8/ Room Air 11/21/12- significant desaturation- less than or equal to 88% for over one hour during the night. PFT 12/12/2012-normal spirometry airflow, normal lung volumes, normal diffusion. Small airway flows improved with bronchodilator. FVC 2.54/85%, FEV1 2.16/97%, FEV1/FVC 0.85, FEF 25-75% 2.79/105%, TLC 95%, DLCO 88% Probable obesity hypoventilation syndrome causing hypoxia at night. Plan-try sample Breo Ellipta. Weight loss encouraged

## 2013-04-22 NOTE — Assessment & Plan Note (Signed)
Good compliance and control 

## 2013-04-22 NOTE — Assessment & Plan Note (Signed)
Already on Dexilant daily. Insurance won't increase.  Plan-add Pepcid to give twice daily therapy. See if this gives better control of occasional heart burn and cough.

## 2013-05-02 ENCOUNTER — Ambulatory Visit (INDEPENDENT_AMBULATORY_CARE_PROVIDER_SITE_OTHER): Payer: Medicaid Other | Admitting: Internal Medicine

## 2013-05-02 ENCOUNTER — Other Ambulatory Visit: Payer: Medicaid Other

## 2013-05-02 ENCOUNTER — Encounter: Payer: Self-pay | Admitting: Internal Medicine

## 2013-05-02 ENCOUNTER — Ambulatory Visit (INDEPENDENT_AMBULATORY_CARE_PROVIDER_SITE_OTHER)
Admission: RE | Admit: 2013-05-02 | Discharge: 2013-05-02 | Disposition: A | Discharge status: Home / Self Care | Payer: Medicaid Other | Source: Ambulatory Visit | Attending: Internal Medicine | Admitting: Internal Medicine

## 2013-05-02 VITALS — BP 130/76 | HR 84 | Ht 62.0 in | Wt 205.4 lb

## 2013-05-02 DIAGNOSIS — R0609 Other forms of dyspnea: Secondary | ICD-10-CM

## 2013-05-02 DIAGNOSIS — R0989 Other specified symptoms and signs involving the circulatory and respiratory systems: Secondary | ICD-10-CM

## 2013-05-02 DIAGNOSIS — R0600 Dyspnea, unspecified: Secondary | ICD-10-CM

## 2013-05-02 DIAGNOSIS — J209 Acute bronchitis, unspecified: Secondary | ICD-10-CM

## 2013-05-02 DIAGNOSIS — G4733 Obstructive sleep apnea (adult) (pediatric): Secondary | ICD-10-CM

## 2013-05-02 MED ORDER — LEVALBUTEROL HCL 0.63 MG/3ML IN NEBU
0.6300 mg | INHALATION_SOLUTION | Freq: Once | RESPIRATORY_TRACT | Status: AC
  Administered 2013-05-02: 0.63 mg via RESPIRATORY_TRACT

## 2013-05-02 MED ORDER — METHYLPREDNISOLONE ACETATE 80 MG/ML IJ SUSP
80.0000 mg | Freq: Once | INTRAMUSCULAR | Status: AC
  Administered 2013-05-02: 80 mg via INTRAMUSCULAR

## 2013-05-02 NOTE — Progress Notes (Signed)
11/11/12- 57 yoF former smoker Dr Letta Median study attached to consult sheet; falls asleep not meaning to and stops breathing as told by her sister; had to have O2 bled through CPAP after recent surgery-no longer has O2. Also states that she feels like her O2 levels drop when she walks. CPAP Advanced, 8 CWP.less sleepy with CPAP. NPSG 07/07/12- AHI 14.3 per hour. Body weight 177 pounds. Mild obstructive sleep apnea/hypoxia syndrome. CPAP titrated to 8. Bedtime between 9 and 10 PM, sleep latency 15 minutes, waking twice before up at 7 AM. Hospital for [facility] hernia repair. At that time desaturating and oxygen was added through her CPAP. Medical history of hypertension, followed by cardiology with atherosclerosis seen on CT scan. She denies MI or stroke. Diabetic with gastroparesis. Abdominal distention crowds breathing lying supine. No history of lung disease. Long history of second hand smoke. She quit smoking herself at age 65 but husband died of lung cancer.  2013-01-05- 57 yo F former smoker followed for OSA, dyspnea Daughter here  On oxygen now. Being evaluated for pancreatitis. She felt "just tired" with some cough and wheeze. Dyspnea on exertion walking 1/2 mile. Wakes in the morning with dry cough. Chest tightness without pain but sometimes with wheeze. Continues CPAP 8/Advanced, all night, every night. 01-05-2013-93%, 96%, 97%, 495 m. Noted some chest tightness while walking, resolved with rest. No oxygen limitation. PFT 01-05-13-normal spirometry airflow, normal lung volumes, normal diffusion. Small airway flows improved with bronchodilator. FVC 2.54/85%, FEV1 2.16/97%, FEV1/FVC 0.85, FEF 25-75% 2.79/105%, TLC 95%, DLCO 88%. ONOX on CPAP 8/ Room Air 11/21/12- significant desaturation- less than or equal to 88% for over one hour during the night. CXR 11/16/12- IMPRESSION:  No acute cardiopulmonary abnormality.  Original Report Authenticated By: Erskine Speed, M.D.  04/05/13- 57 yo F former  smoker followed for OSA, dyspnea, OHS    Sister here Short of breath started mid-may, coming and going, but worsened in June,hoarseness, worsened, coughing, no production except in the mornings, greenish yellow.  nasal canula with cpap 8/ Advanced and O2 2L - wearing it every night, no problems with pressure.  Hoarseness and dyspnea, and go. On Dexilant for known reflux and peptic ulcer. Discussed relation to hoarseness. Uses rescue inhaler at least once daily, but not wheezing.  05/02/13- 57 yo F former smoker followed for OSA, dyspnea, OHS    Dtr here here ACUTE VISIT: spoke with CY over wkend; told to call if no better today; continues to have SOB and CP in center upper and under breast area. Dry cough at night. Using [facility] and Pepcid. CPAP 8/ O2 2L/Advanced. Will short of breath and put oxygen on without CPAP dyspnea persisted. No sore throat or fever feels worse with cough and wheeze. Orthopnea 3 pillows but feels better. Heavy sensation upper anterior chest, not pain, persistent. Was taken off diuretic in January because she didn't seem to need it, and has felt bloated with more rapid pulse. Weight gain 6 lbs since Feb. Denies history of MI or blood clot. Started tetracycline 04/26/13. Started prednisone taper 7/27.   ROS-see HPI Constitutional:   No-   weight loss, night sweats, fevers, chills, +fatigue, lassitude. HEENT:   No-  headaches, difficulty swallowing, tooth/dental problems, sore throat,       No-  sneezing, itching, ear ache, +nasal congestion, post nasal drip,  CV:  No-   chest pain, orthopnea, PND, swelling in lower extremities, anasarca, dizziness, +palpitations Resp: + shortness of breath with exertion or at rest.  No-   productive cough,  + non-productive cough,  No- coughing up of blood.              No-   change in color of mucus.  + wheezing.   Skin: No-   rash or lesions. GI:  + heartburn, no-indigestion, abdominal pain, nausea, vomiting,  GU: No-   dysuria,  change in color of urine, no urgency or frequency.  No- flank pain. MS:  No-   joint pain or swelling.   Neuro-     nothing unusual Psych:  No- change in mood or affect. + depression or anxiety.  No memory loss.  OBJ- Physical Exam General- Alert, Oriented, Affect-appropriate, Distress- none acute. Overweight Skin- rash-none, lesions- none, excoriation- none Lymphadenopathy- none Head- atraumatic            Eyes- Gross vision intact, PERRLA, conjunctivae and secretions clear            Ears- Hearing, canals-normal            Nose- Clear, no-Septal dev, mucus, polyps, erosion, perforation             Throat- Mallampati III , mucosa clear , drainage- none, tonsils- atrophic. +Upper denture, lower                          missing teeth. Neck- flexible , trachea midline, no stridor , thyroid nl, carotid no bruit Chest - symmetrical excursion , unlabored           Heart/CV- RRR , no murmur , no gallop  , no rub, nl s1 s2                           - JVD- none , edema- none, stasis changes- none, varices- none           Lung- clear to P&A, wheeze- none, cough- none , dullness-none, rub- none           Chest wall-  Abd-  Br/ Gen/ Rectal- Not done, not indicated Extrem-  Neuro- grossly intact to observation

## 2013-05-02 NOTE — Patient Instructions (Addendum)
Neb xop 0.63  Depo 80  EKG   Dx Chest pain  Order lab-  D-dimer, Troponin I  Dx dyspnea  Order CXR   Dx dyspnea,

## 2013-05-03 LAB — TROPONIN I: Troponin I: 0.02 ng/mL (ref <0.06)

## 2013-05-03 NOTE — Progress Notes (Signed)
Quick Note:  Spoke with patient, informed her of results/recs as listed below per Dr. Maple Hudson Patient verbalized understanding and nothing further needed at this time ______

## 2013-05-04 ENCOUNTER — Telehealth: Payer: Self-pay | Admitting: Internal Medicine

## 2013-05-04 NOTE — Telephone Encounter (Signed)
Notes Recorded by Waymon Budge, MD on 05/02/2013 at 8:54 PM CXR looks clear with no active process  I spoke with patient about results and she verbalized understanding and had no questions

## 2013-05-08 ENCOUNTER — Ambulatory Visit (HOSPITAL_COMMUNITY)
Admission: RE | Admit: 2013-05-08 | Discharge: 2013-05-08 | Disposition: A | Discharge status: Home / Self Care | Payer: Medicaid Other | Source: Ambulatory Visit | Attending: Family Medicine | Admitting: Family Medicine

## 2013-05-08 DIAGNOSIS — Z1231 Encounter for screening mammogram for malignant neoplasm of breast: Secondary | ICD-10-CM | POA: Insufficient documentation

## 2013-05-08 DIAGNOSIS — Z139 Encounter for screening, unspecified: Secondary | ICD-10-CM

## 2013-05-14 DIAGNOSIS — J209 Acute bronchitis, unspecified: Secondary | ICD-10-CM | POA: Insufficient documentation

## 2013-05-14 NOTE — Assessment & Plan Note (Addendum)
Tentative diagnosis based on her report of cough and wheeze. Further testing required. Plan-EKG, D- Dimer, chest x-ray, cardiac enzymes, nebulizer treatment Xopenex

## 2013-05-14 NOTE — Assessment & Plan Note (Signed)
Continues CPAP 8/O2 2L/ Advanced

## 2013-05-15 ENCOUNTER — Telehealth: Payer: Self-pay | Admitting: Internal Medicine

## 2013-05-15 NOTE — Telephone Encounter (Signed)
Left message for patient to call back-was not home at the time of my call.

## 2013-05-15 NOTE — Telephone Encounter (Signed)
Pt returned call. Please call [phone] instead of the # originally given  (that's her sister's house #). Destiny Robinson

## 2013-05-15 NOTE — Telephone Encounter (Signed)
Ok to schedule 6 MWT on room air, so we can see if she qualifies for portable O2  Dx COPD

## 2013-05-15 NOTE — Telephone Encounter (Signed)
Pt is returning call & can be reached at [phone].  Destiny Robinson

## 2013-05-15 NOTE — Telephone Encounter (Signed)
lmtcb x1 

## 2013-05-15 NOTE — Telephone Encounter (Signed)
i spoke with pt and scheduled . Will sign off message

## 2013-05-15 NOTE — Telephone Encounter (Signed)
Called spoke with patient who reports her breathing is improved since last ov, but noticed some increased DOE x2 weeks > mainly when she ambulates "any distance outside" w/ hoarsness, wheezing w/ lying down and bloating.  The patient reports the wheezing improves if she wears her O2 when she lies down.  Pt denies any cough, congestion, tightness in chest, CP, edema.    Pt is requesting Dr Roxy Cedar recs and wonders if wearing O2 continuously would be an option, though I advised her insurance will require qualification for this. Pt has also called her her GI physician. Dr Maple Hudson please advise, thank you.  Allergies  Allergen Reactions  . Codeine Hypertension  . Penicillins Anaphylaxis  . Reglan (Metoclopramide) Swelling  . Sulfa Antibiotics Hives    Pt states "some of it, not all of it" give her a reaction; "hives all over"  . Xifaxan (Rifaximin) Hives and Shortness Of Breath    SOB  . Zocor (Simvastatin) Anaphylaxis and [employer].

## 2013-05-16 ENCOUNTER — Ambulatory Visit (INDEPENDENT_AMBULATORY_CARE_PROVIDER_SITE_OTHER): Payer: Medicaid Other | Admitting: Internal Medicine

## 2013-05-16 DIAGNOSIS — R0600 Dyspnea, unspecified: Secondary | ICD-10-CM

## 2013-05-16 DIAGNOSIS — R0609 Other forms of dyspnea: Secondary | ICD-10-CM

## 2013-06-02 ENCOUNTER — Other Ambulatory Visit: Payer: Self-pay | Admitting: Internal Medicine

## 2013-06-02 MED ORDER — MOMETASONE FURO-FORMOTEROL FUM 100-5 MCG/ACT IN AERO: 2.0000 | INHALATION_SPRAY | Freq: Two times a day (BID) | RESPIRATORY_TRACT | Status: DC

## 2013-06-02 NOTE — Telephone Encounter (Signed)
pts insurance will not cover the breo.  Ok per CY to change to the dulera 100 mg  2 puff bid+ rinse mouth after each use.  #1  With prn refills.  This has been sent to the pharmacy with a note attached to make the pharmacy aware.

## 2013-06-03 NOTE — Progress Notes (Signed)
Documentation for 6 minute walk test 

## 2013-07-26 ENCOUNTER — Ambulatory Visit (INDEPENDENT_AMBULATORY_CARE_PROVIDER_SITE_OTHER): Payer: Medicaid Other

## 2013-07-26 VITALS — BP 139/87 | HR 99 | Resp 20 | Ht 62.0 in | Wt 198.0 lb

## 2013-07-26 DIAGNOSIS — R269 Unspecified abnormalities of gait and mobility: Secondary | ICD-10-CM

## 2013-07-26 DIAGNOSIS — R52 Pain, unspecified: Secondary | ICD-10-CM

## 2013-07-26 DIAGNOSIS — M7752 Other enthesopathy of left foot: Secondary | ICD-10-CM

## 2013-07-26 DIAGNOSIS — M76821 Posterior tibial tendinitis, right leg: Secondary | ICD-10-CM

## 2013-07-26 DIAGNOSIS — M2020 Hallux rigidus, unspecified foot: Secondary | ICD-10-CM

## 2013-07-26 DIAGNOSIS — M2022 Hallux rigidus, left foot: Secondary | ICD-10-CM

## 2013-07-26 DIAGNOSIS — Z9889 Other specified postprocedural states: Secondary | ICD-10-CM

## 2013-07-26 DIAGNOSIS — M779 Enthesopathy, unspecified: Secondary | ICD-10-CM

## 2013-07-26 MED ORDER — TRIAMCINOLONE ACETONIDE 10 MG/ML IJ SUSP: 10.0000 mg | Freq: Once | INTRAMUSCULAR | Status: DC

## 2013-07-26 NOTE — Progress Notes (Signed)
  Subjective:    Patient ID: Destiny Robinson, female    DOB: 06-22-1956, 57 y.o.   MRN: [id number] "My right foot is good except for my ankle.  It hurts in this joint on my left foot."  HPI Comments: N sharp shooting pains L  Bunion left painful D  1 month O  Gradually C  Gotten worse A  Bending it T  Tylenol       Review of Systems  Constitutional: Negative.   HENT: Positive for congestion.   Eyes: Positive for itching.  Respiratory: Positive for apnea and wheezing.   Cardiovascular: Positive for leg swelling.  Gastrointestinal: Positive for nausea, vomiting, abdominal pain and diarrhea.  Endocrine: Negative.   Genitourinary: Positive for difficulty urinating.  Musculoskeletal: Positive for arthralgias and joint swelling.  Skin: Negative.   Allergic/Immunologic: Positive for environmental allergies and immunocompromised state.  Neurological: Negative.   Hematological: Bruises/bleeds easily.  Psychiatric/Behavioral: Negative.        Objective:   Physical Exam Neurovascular status is intact. Pedal pulses palpable DP and PT +2/4. Refill timed 3-4 seconds all digits. Patient plenty of pain medial left first MTP area limitation of motion of the great toe joint left x-rays confirm essentially space narrowing for spurring first MTP area consistent with hallux demonstrates deformity left foot similar to the problem she's had on the right foot for which she's had surgical correction. Right foot has successful fusion with screw fixation intact as confirmed on x-rays. The only concern on the right side at this time is some residual posterior tibial tendinitis secondary to history gait changes and sciatic neuropathy causing gait abnormality. Patient is having some gait unsteadiness and was recommended to use a cane for walking. Skin color pigment normal hair growth absent patient has mild to moderate HAV deformity on the left with limitation of motion great toe joint and asymmetric joint  space narrowing       Assessment & Plan:  Assessment this time mild posterior tibial tendinitis right ankle. Good postop progress following first MTP fusion right foot with intact screw fixation. Left foot demonstrates continued onset of arthrosis first MTP joint and capsulitis secondary to hallux limitus rigidus deformity left. Per patient request my recommendation the injection tender with Kenalog in 20 mg Xylocaine plain infiltrated to the first MTP joint left recommended ice and Tylenol for pain reappointed 2 months for long-term followup maintain a stiff soled athletic or walking shoe at all times patient may be candidate for future surgical intervention or additional steroid injections based on progress.  Alvan Dame DPM

## 2013-07-26 NOTE — Patient Instructions (Signed)

## 2013-08-10 ENCOUNTER — Encounter: Payer: Self-pay | Admitting: Internal Medicine

## 2013-08-10 ENCOUNTER — Other Ambulatory Visit: Payer: Self-pay

## 2013-08-10 ENCOUNTER — Ambulatory Visit (INDEPENDENT_AMBULATORY_CARE_PROVIDER_SITE_OTHER): Payer: Medicaid Other | Admitting: Internal Medicine

## 2013-08-10 VITALS — BP 118/82 | HR 106 | Ht 62.0 in | Wt 209.4 lb

## 2013-08-10 DIAGNOSIS — R0609 Other forms of dyspnea: Secondary | ICD-10-CM

## 2013-08-10 DIAGNOSIS — G4733 Obstructive sleep apnea (adult) (pediatric): Secondary | ICD-10-CM

## 2013-08-10 MED ORDER — MOMETASONE FURO-FORMOTEROL FUM 100-5 MCG/ACT IN AERO: INHALATION_SPRAY | RESPIRATORY_TRACT | Status: DC

## 2013-08-10 NOTE — Progress Notes (Signed)
11/11/12- 57 yoF former smoker Dr Roma Schanz study attached to consult sheet; falls asleep not meaning to and stops breathing as told by her sister; had to have O2 bled through CPAP after recent surgery-no longer has O2. Also states that she feels like her O2 levels drop when she walks. CPAP Advanced, 8 CWP.less sleepy with CPAP. NPSG 07/07/12- AHI 14.3 per hour. Body weight 177 pounds. Mild obstructive sleep apnea/hypoxia syndrome. CPAP titrated to 8. Bedtime between 9 and 10 PM, sleep latency 15 minutes, waking twice before up at [facility] for umbilical hernia repair. At that time desaturating and oxygen was added through her CPAP. Medical history of hypertension, followed by cardiology with atherosclerosis seen on CT scan. She denies MI or stroke. Diabetic with gastroparesis. Abdominal distention crowds breathing lying supine. No history of lung disease. Long history of second hand smoke. She quit smoking herself at age 57 but husband died of lung cancer.  12-31-12- 57 yo F former smoker followed for OSA, dyspnea Daughter here  On oxygen now. Being evaluated for pancreatitis. She felt "just tired" with some cough and wheeze. Dyspnea on exertion walking 1/2 mile. Wakes in the morning with dry cough. Chest tightness without pain but sometimes with wheeze. Continues CPAP 8/Advanced, all night, every night. 6MWT 2012-12-31-93%, 96%, 97%, 495 m. Noted some chest tightness while walking, resolved with rest. No oxygen limitation. PFT December 31, 2012-normal spirometry airflow, normal lung volumes, normal diffusion. Small airway flows improved with bronchodilator. FVC 2.54/85%, FEV1 2.16/97%, FEV1/FVC 0.85, FEF 25-75% 2.79/105%, TLC 95%, DLCO 88%. ONOX on CPAP 8/ Room Air 11/21/12- significant desaturation- less than or equal to 88% for over one hour during the night. CXR 11/16/12- IMPRESSION:  No acute cardiopulmonary abnormality.  Original Report Authenticated By: Roselyn Reef, M.D.  04/05/13- 57 yo F former  smoker followed for OSA, dyspnea, OHS    Sister here Short of breath started mid-may, coming and going, but worsened in June,hoarseness, worsened, coughing, no production except in the mornings, greenish yellow.  nasal canula with cpap 8/ Advanced and O2 2L - wearing it every night, no problems with pressure.  Hoarseness and dyspnea, and go. On Dexilant for known reflux and peptic ulcer. Discussed relation to hoarseness. Uses rescue inhaler at least once daily, but not wheezing.  05/02/13- 57 yo F former smoker followed for OSA, dyspnea, OHS    Dtr here here ACUTE VISIT: spoke with CY over wkend; told to call if no better today; continues to have SOB and CP in center upper and under breast area. Dry cough at night. Using [facility] and Pepcid. CPAP 8/ O2 2L/Advanced. Will short of breath and put oxygen on without CPAP dyspnea persisted. No sore throat or fever feels worse with cough and wheeze. Orthopnea 3 pillows but feels better. Heavy sensation upper anterior chest, not pain, persistent. Was taken off diuretic in January because she didn't seem to need it, and has felt bloated with more rapid pulse. Weight gain 6 lbs since Feb. Denies history of MI or blood clot. Started tetracycline 04/26/13. Started prednisone taper 7/27.   08/10/13- 57 yo F former smoker followed for OSA, dyspnea, OHS    Dtr here, wearing portable oxygen FOLLOWS FOR: has noticed she wheezes alot when moving around. Review 6MW test with patient. CPAP 8/ O2 2L/ Advanced Weight up 10 lbs in past month. Blames flu vaccine October 30 for vague malaise.we reviewed labs, all okay. Dr Edrick Oh started [facility]- she likes. Discussed pneumonia vaccine. 6 MWT 05/16/13- 95%, 94%,  97%, 474 m. Some nonspecific chest tightness relieved by rest. No oxygen limitation on this exercise. CXR 05/02/13 IMPRESSION:  No acute cardiopulmonary disease identified.  Original Report Authenticated By: Sherian Rein, M.D.  ROS-see HPI Constitutional:   No-   weight  loss, night sweats, fevers, chills, +fatigue, lassitude. HEENT:   No-  headaches, difficulty swallowing, tooth/dental problems, sore throat,       No-  sneezing, itching, ear ache, +nasal congestion, post nasal drip,  CV:  No-   chest pain, orthopnea, PND, swelling in lower extremities, anasarca, dizziness, +palpitations Resp: + shortness of breath with exertion or at rest.              No-   productive cough,  + non-productive cough,  No- coughing up of blood.              No-   change in color of mucus.  + wheezing.   Skin: No-   rash or lesions. GI:  + heartburn, no-indigestion, abdominal pain, nausea, vomiting,  GU: . MS:  No-   joint pain or swelling.   Neuro-     nothing unusual Psych:  No- change in mood or affect. + depression or anxiety.  No memory loss.  OBJ- Physical Exam General- Alert, Oriented, Affect-appropriate, Distress- none acute. Overweight Skin- rash-none, lesions- none, excoriation- none Lymphadenopathy- none Head- atraumatic            Eyes- Gross vision intact, PERRLA, conjunctivae and secretions clear            Ears- Hearing, canals-normal            Nose- Clear, no-Septal dev, mucus, polyps, erosion, perforation             Throat- Mallampati III , mucosa clear , drainage- none, tonsils- atrophic. +Upper denture, lower                          missing teeth. Neck- flexible , trachea midline, no stridor , thyroid nl, carotid no bruit Chest - symmetrical excursion , unlabored           Heart/CV- RRR , no murmur , no gallop  , no rub, nl s1 s2                           - JVD- none , edema- none, stasis changes- none, varices- none           Lung- clear to P&A, wheeze- none, cough- none , dullness-none, rub- none           Chest wall-  Abd-  Br/ Gen/ Rectal- Not done, not indicated Extrem-  Neuro- grossly intact to observation

## 2013-08-23 ENCOUNTER — Other Ambulatory Visit: Payer: Self-pay

## 2013-08-25 NOTE — Assessment & Plan Note (Signed)
Malaise, nonspecific, after flu shot. She likes Elwin Sleight- educated on bronchodilators.

## 2013-08-25 NOTE — Assessment & Plan Note (Signed)
Discussed CPAP compliance and oxygenation. Probable component of obesity hypoventilation syndrome causing nocturnal desaturation.  Plan-work first on CPAP optimization including compliance and mask fit

## 2013-08-25 NOTE — Patient Instructions (Addendum)
We can continue CPAP 8 with oxygen 2 L/Advanced  Watch response to [facility]  Keep weight down

## 2013-09-20 ENCOUNTER — Ambulatory Visit (INDEPENDENT_AMBULATORY_CARE_PROVIDER_SITE_OTHER): Payer: Medicaid Other

## 2013-09-20 VITALS — BP 136/80 | HR 94 | Resp 16 | Ht 62.0 in | Wt 198.0 lb

## 2013-09-20 DIAGNOSIS — M2020 Hallux rigidus, unspecified foot: Secondary | ICD-10-CM

## 2013-09-20 DIAGNOSIS — S90121A Contusion of right lesser toe(s) without damage to nail, initial encounter: Secondary | ICD-10-CM

## 2013-09-20 DIAGNOSIS — M2022 Hallux rigidus, left foot: Secondary | ICD-10-CM

## 2013-09-20 DIAGNOSIS — E1142 Type 2 diabetes mellitus with diabetic polyneuropathy: Secondary | ICD-10-CM

## 2013-09-20 DIAGNOSIS — R52 Pain, unspecified: Secondary | ICD-10-CM

## 2013-09-20 DIAGNOSIS — M7752 Other enthesopathy of left foot: Secondary | ICD-10-CM

## 2013-09-20 MED ORDER — GABAPENTIN 300 MG PO CAPS: 300.0000 mg | ORAL_CAPSULE | Freq: Three times a day (TID) | ORAL | Status: DC

## 2013-09-20 NOTE — Patient Instructions (Signed)

## 2013-09-20 NOTE — Progress Notes (Signed)
   Subjective:    Patient ID: Denna Haggard, female    DOB: 1956-06-28, 57 y.o.   MRN: [id number]  "I think I broke my toe on the right foot.  I hit it on a couch with my shoe on.  I heard it pop."  HPI Comments: N  Throbs, swelling, redness L  5th digit right painful D  Yesterday  O  Suddenly  C  About the same A  Shoes  T  Iced it       Review of Systems no change     Objective:   Physical Exam Neurovascular status is unchanged patient continues to smoke paresthesia burning in both feet pain tenderness and dorsal to plantar flexion of the knee the digits on the left foot. Significant hallux with capsulitis left foot noted neurovascular status unchanged bilateral. When you completed this time is a contusion to the fifth toe right foot ran into a door jam. X-rays reveal no signs of fracture no displacements dislocation. Likely findings severe contusion of toe with significant edema being noted dispensed and applied buddy wrap utilizing Coflex wrap in. Maintain Coflex wrap for the next several weeks.  Patient continues to pain of the left foot is bending gabapentin just once at bedtime at this time I suggested since she cannot take any NSAIDs due to GI history ulcerations of her gabapentin dose to 300 mg 3 times daily recheck in 2-3 months for followup       Assessment & Plan:  #1 contusion of toe right foot apply Coflex wrap and suggested ice to the area. Capsulitis hallux rigidus first MTP area left foot as well as persistent diabetic neuropathy and paresthesia. Upgraded dose gabapentin 300 mg 3 times daily called in to pharmacy. Recheck in 2-3 months for reassessment. Maintain appropriate accommodative shoes. No barefoot or flimsy shoes allowed.  Alvan Dame DP

## 2013-10-31 ENCOUNTER — Telehealth: Payer: Self-pay | Admitting: *Deleted

## 2013-10-31 NOTE — Telephone Encounter (Signed)
sched pt to come in tomorrow 1-28 @ 1:30 pm

## 2013-10-31 NOTE — Telephone Encounter (Signed)
Pt complains of a sticking or stinging feeling, and blueness in the left big toe of the foot Dr Blenda Mounts did surgery.  I told pt I would have the schedulers call to set up a earlier appt than March.

## 2013-11-01 ENCOUNTER — Ambulatory Visit (INDEPENDENT_AMBULATORY_CARE_PROVIDER_SITE_OTHER): Payer: Medicaid Other

## 2013-11-01 DIAGNOSIS — M1990 Unspecified osteoarthritis, unspecified site: Secondary | ICD-10-CM

## 2013-11-01 DIAGNOSIS — G629 Polyneuropathy, unspecified: Secondary | ICD-10-CM

## 2013-11-01 DIAGNOSIS — E1149 Type 2 diabetes mellitus with other diabetic neurological complication: Secondary | ICD-10-CM

## 2013-11-01 DIAGNOSIS — G609 Hereditary and idiopathic neuropathy, unspecified: Secondary | ICD-10-CM

## 2013-11-01 DIAGNOSIS — R52 Pain, unspecified: Secondary | ICD-10-CM

## 2013-11-01 DIAGNOSIS — E1142 Type 2 diabetes mellitus with diabetic polyneuropathy: Secondary | ICD-10-CM

## 2013-11-01 NOTE — Patient Instructions (Signed)
Diabetes and Foot Care Diabetes may cause you to have problems because of poor blood supply (circulation) to your feet and legs. This may cause the skin on your feet to become thinner, break easier, and heal more slowly. Your skin may become dry, and the skin may peel and crack. You may also have nerve damage in your legs and feet causing decreased feeling in them. You may not notice minor injuries to your feet that could lead to infections or more serious problems. Taking care of your feet is one of the most important things you can do for yourself.  HOME CARE INSTRUCTIONS  Wear shoes at all times, even in the house. Do not go barefoot. Bare feet are easily injured.  Check your feet daily for blisters, cuts, and redness. If you cannot see the bottom of your feet, use a mirror or ask someone for help.  Wash your feet with warm water (do not use hot water) and mild soap. Then pat your feet and the areas between your toes until they are completely dry. Do not soak your feet as this can dry your skin.  Apply a moisturizing lotion or petroleum jelly (that does not contain alcohol and is unscented) to the skin on your feet and to dry, brittle toenails. Do not apply lotion between your toes.  Trim your toenails straight across. Do not dig under them or around the cuticle. File the edges of your nails with an emery board or nail file.  Do not cut corns or calluses or try to remove them with medicine.  Wear clean socks or stockings every day. Make sure they are not too tight. Do not wear knee-high stockings since they may decrease blood flow to your legs.  Wear shoes that fit properly and have enough cushioning. To break in new shoes, wear them for just a few hours a day. This prevents you from injuring your feet. Always look in your shoes before you put them on to be sure there are no objects inside.  Do not cross your legs. This may decrease the blood flow to your feet.  If you find a minor  scrape, cut, or break in the skin on your feet, keep it and the skin around it clean and dry. These areas may be cleansed with mild soap and water. Do not cleanse the area with peroxide, alcohol, or iodine.  When you remove an adhesive bandage, be sure not to damage the skin around it.  If you have a wound, look at it several times a day to make sure it is healing.  Do not use heating pads or hot water bottles. They may burn your skin. If you have lost feeling in your feet or legs, you may not know it is happening until it is too late.  Make sure your health care provider performs a complete foot exam at least annually or more often if you have foot problems. Report any cuts, sores, or bruises to your health care provider immediately. SEEK MEDICAL CARE IF:   You have an injury that is not healing.  You have cuts or breaks in the skin.  You have an ingrown nail.  You notice redness on your legs or feet.  You feel burning or tingling in your legs or feet.  You have pain or cramps in your legs and feet.  Your legs or feet are numb.  Your feet always feel cold. SEEK IMMEDIATE MEDICAL CARE IF:   There is increasing  redness, swelling, or pain in or around a wound.  There is a red line that goes up your leg.  Pus is coming from a wound.  You develop a fever or as directed by your health care provider.  You notice a bad smell coming from an ulcer or wound. Document Released: 09/18/2000 Document Revised: 05/24/2013 Document Reviewed: 02/28/2013 [facility] Patient Information 2014 [place].  To help with the neuropathy and burning in the feet and stinging in the feet increase your dose of gabapentin to 4 pills a day for 3 or 4 day. And then return to regular doses needed  Followup in one month if symptoms continue

## 2013-11-01 NOTE — Progress Notes (Signed)
   Subjective:    Patient ID: Destiny Robinson, female    DOB: 12/27/1955, 58 y.o.   MRN: [id number]  HPI Comments: "It feels like something is sticking me under this right big toe"  DOS 02-06-2013 POV hallux fusion right       Review of Systems no significant changes or findings other than patient's blood sugars have been running high. Patient had some kidney infections and kidney stones and her blood sugars have been running higher than usual.     Objective:   Physical Exam For her extremity objective findings vascular status is intact with DP +2/4 PT plus one over 4 bilateral Refill time 3 seconds all digits skin temperature warm turgor normal no edema rubor pallor or varicosities noted dermatologically skin color pigment normal hair growth absent nails unremarkable. Patient describing pain and she both feet have paresthesias and diabetic neuropathy symptoms or has more significant pain on palpation around first MTP joint under the metatarsal sesamoids actually the distal IP joint is painful as well as and digital sulcus  of the right hallux. No history of injury or trauma. Patient also indicates that in her hands been bothering her more and her left foot is also bothering and burning at times as well to patient has increased her gabapentin to 3 times a day as recommended last visit.       Assessment & Plan:  Assessment this time is diabetic neuropathy and neuralgia x-rays revealed this time consolidation of the osteotomy and first fusion first MTP joint right foot. Although there may be some irritation from the screw fixation I cannot actually palpate the screw however there is diffuse paresthesia and pain on palpation around MTP IP joint of the right hallux and right forefoot. My feeling is this time is exacerbation of her diabetic neuropathy due to elevated blood sugars they're adjusting her insulin dosage and hopefully 1 seconds improved her symptoms may resolve temporarily this time  suggested increasing gabapentin to 300 mg 4 times a day for a 3-5 day regimen and then resuming regular dosing. Followup in one month if there is no improvement  Harriet Masson DPM

## 2013-12-01 ENCOUNTER — Ambulatory Visit: Payer: Medicaid Other

## 2013-12-08 ENCOUNTER — Ambulatory Visit: Payer: Medicaid Other

## 2013-12-19 ENCOUNTER — Ambulatory Visit: Payer: Medicaid Other

## 2013-12-20 ENCOUNTER — Ambulatory Visit: Payer: Medicaid Other

## 2013-12-26 ENCOUNTER — Ambulatory Visit: Payer: Medicaid Other

## 2013-12-27 ENCOUNTER — Ambulatory Visit (INDEPENDENT_AMBULATORY_CARE_PROVIDER_SITE_OTHER): Payer: Medicaid Other

## 2013-12-27 VITALS — BP 166/99 | HR 100 | Resp 12

## 2013-12-27 DIAGNOSIS — R269 Unspecified abnormalities of gait and mobility: Secondary | ICD-10-CM

## 2013-12-27 DIAGNOSIS — E1142 Type 2 diabetes mellitus with diabetic polyneuropathy: Secondary | ICD-10-CM

## 2013-12-27 DIAGNOSIS — G609 Hereditary and idiopathic neuropathy, unspecified: Secondary | ICD-10-CM

## 2013-12-27 DIAGNOSIS — G629 Polyneuropathy, unspecified: Secondary | ICD-10-CM

## 2013-12-27 DIAGNOSIS — R52 Pain, unspecified: Secondary | ICD-10-CM

## 2013-12-27 DIAGNOSIS — M1990 Unspecified osteoarthritis, unspecified site: Secondary | ICD-10-CM

## 2013-12-27 DIAGNOSIS — E1149 Type 2 diabetes mellitus with other diabetic neurological complication: Secondary | ICD-10-CM

## 2013-12-27 DIAGNOSIS — G608 Other hereditary and idiopathic neuropathies: Secondary | ICD-10-CM

## 2013-12-27 MED ORDER — GABAPENTIN 300 MG PO CAPS: ORAL_CAPSULE | ORAL | Status: DC

## 2013-12-27 NOTE — Patient Instructions (Signed)
Diabetes and Foot Care Diabetes may cause you to have problems because of poor blood supply (circulation) to your feet and legs. This may cause the skin on your feet to become thinner, break easier, and heal more slowly. Your skin may become dry, and the skin may peel and crack. You may also have nerve damage in your legs and feet causing decreased feeling in them. You may not notice minor injuries to your feet that could lead to infections or more serious problems. Taking care of your feet is one of the most important things you can do for yourself.  HOME CARE INSTRUCTIONS  Wear shoes at all times, even in the house. Do not go barefoot. Bare feet are easily injured.  Check your feet daily for blisters, cuts, and redness. If you cannot see the bottom of your feet, use a mirror or ask someone for help.  Wash your feet with warm water (do not use hot water) and mild soap. Then pat your feet and the areas between your toes until they are completely dry. Do not soak your feet as this can dry your skin.  Apply a moisturizing lotion or petroleum jelly (that does not contain alcohol and is unscented) to the skin on your feet and to dry, brittle toenails. Do not apply lotion between your toes.  Trim your toenails straight across. Do not dig under them or around the cuticle. File the edges of your nails with an emery board or nail file.  Do not cut corns or calluses or try to remove them with medicine.  Wear clean socks or stockings every day. Make sure they are not too tight. Do not wear knee-high stockings since they may decrease blood flow to your legs.  Wear shoes that fit properly and have enough cushioning. To break in new shoes, wear them for just a few hours a day. This prevents you from injuring your feet. Always look in your shoes before you put them on to be sure there are no objects inside.  Do not cross your legs. This may decrease the blood flow to your feet.  If you find a minor scrape,  cut, or break in the skin on your feet, keep it and the skin around it clean and dry. These areas may be cleansed with mild soap and water. Do not cleanse the area with peroxide, alcohol, or iodine.  When you remove an adhesive bandage, be sure not to damage the skin around it.  If you have a wound, look at it several times a day to make sure it is healing.  Do not use heating pads or hot water bottles. They may burn your skin. If you have lost feeling in your feet or legs, you may not know it is happening until it is too late.  Make sure your health care provider performs a complete foot exam at least annually or more often if you have foot problems. Report any cuts, sores, or bruises to your health care provider immediately. SEEK MEDICAL CARE IF:   You have an injury that is not healing.  You have cuts or breaks in the skin.  You have an ingrown nail.  You notice redness on your legs or feet.  You feel burning or tingling in your legs or feet.  You have pain or cramps in your legs and feet.  Your legs or feet are numb.  Your feet always feel cold. SEEK IMMEDIATE MEDICAL CARE IF:   There is increasing redness,   swelling, or pain in or around a wound.  There is a red line that goes up your leg.  Pus is coming from a wound.  You develop a fever or as directed by your health care provider.  You notice a bad smell coming from an ulcer or wound. Document Released: 09/18/2000 Document Revised: 05/24/2013 Document Reviewed: 02/28/2013 [employer] Patient Information 2014 [employer].  

## 2013-12-27 NOTE — Progress Notes (Signed)
   Subjective:    Patient ID: Destiny Robinson, female    DOB: 04/27/1956, 58 y.o.   MRN: [id number]  HPI  PT STATED BOTH FEET ARE SWELLING AND HURTING, ESPECIALLY LT FOOT FOR 3 WEEKS. THE FEET ARE GETTING WORSE. THE FEET GET AGGRAVATED BY WALKING ND PUTTING PRESSURE. TRIED TO TAKE TYLENOL 3 TAB A DAY.   Review of Systems no changes or findings are noted    Objective:   Physical Exam Neurovascular status is intact although diminished pedal pulses DP postal for PT one over 4 bilateral refill timed 3-4 seconds all digits turgor normal there is +1 edema noted bilateral no mild no rubor pallor or varicosities noted neurologically epicritic and proprioceptive sensations grossly diminished on Semmes Weinstein testing patient hyperesthesias in her hands and in her feet both feet around the great toe joint and lesser toes both hands around the knuckles are painful symptomatic edematous. Patient indicates increased gabapentin help temporarily which is leading to 2 days at this time we'll update her to 600 mg 3 times a day increasing the gabapentin dose may help her or symptomology significantly. Orthopedic no mechanical exam otherwise unremarkable x-rays reveal no other significant changes intact fixation of the first MTP fusion right foot no displacements no irritation skin open wounds ulcerations no other abnormal findings no increased temperature no ascending psoas lymphangitis noted.       Assessment & Plan:  Assessment diabetes with peripheral neuropathy and arthropathy. Patient is status post first MTP fusion right rectus hip persistent paresthesia or hyperesthesia both feet lower extremities as well as both hands he did responses every increased gabapentin at this time we'll initiate a higher dose of gabapentin prescription to her pharmacy will take 23 mg capsules 3 times daily followup in 3 months for reassessment  Harriet Masson DPM

## 2014-01-02 ENCOUNTER — Ambulatory Visit: Payer: Medicaid Other

## 2014-02-22 ENCOUNTER — Other Ambulatory Visit: Payer: Self-pay | Admitting: Internal Medicine

## 2014-03-02 ENCOUNTER — Ambulatory Visit (INDEPENDENT_AMBULATORY_CARE_PROVIDER_SITE_OTHER): Payer: Medicaid Other

## 2014-03-02 VITALS — Resp 16 | Ht 62.0 in | Wt 214.0 lb

## 2014-03-02 DIAGNOSIS — G629 Polyneuropathy, unspecified: Secondary | ICD-10-CM

## 2014-03-02 DIAGNOSIS — M1990 Unspecified osteoarthritis, unspecified site: Secondary | ICD-10-CM

## 2014-03-02 DIAGNOSIS — R269 Unspecified abnormalities of gait and mobility: Secondary | ICD-10-CM

## 2014-03-02 DIAGNOSIS — Z79899 Other long term (current) drug therapy: Secondary | ICD-10-CM

## 2014-03-02 DIAGNOSIS — IMO0002 Reserved for concepts with insufficient information to code with codable children: Secondary | ICD-10-CM

## 2014-03-02 DIAGNOSIS — E1142 Type 2 diabetes mellitus with diabetic polyneuropathy: Secondary | ICD-10-CM

## 2014-03-02 NOTE — Progress Notes (Signed)
   Subjective:    Patient ID: Destiny Robinson, female    DOB: 1955/11/06, 58 y.o.   MRN: [id number]  HPI Comments: Pt states the numbness is a little better, except at night.  Pt states has blister on right plantar 2nd toe two weeks ago, but has no known injury.     Review of Systems no new findings or systemic changes noted     Objective:   Physical Exam Neurovascular status is intact pedal pulses are palpable DP pulse two over four bilateral PT plus one over 4 bilateral patient is been taking gabapentin 600 mg 3 times a day and had significant improvement however indicates your recent A1c indicates her blood sugars been running high A1c recently was 8.4 patient been losing some weight however is to do a better job managing her diabetes and blood sugars. Is likely contributing to neuropathy patient is advised that should note that her first MTP joint right foot is doing well does have some arthropathy and generalized soreness maintain good stable walking or athletic shoe may be K. for diabetic shoes in the future per patient request we'll followup as needed if she decides to followup at the shoes. At this time maintain current regimen of 60 mg 3 times a day or 800 mg daily gabapentin recheck in 6 months for long-term follow       Assessment & Plan:  Assessment diabetes with peripheral neuropathy arthropathy the foot with use first MTP joint right foot patient does have some gait difficulties abnormalities will continue with gabapentin as instructed 6 month followup if needed on an as-needed basis for possible diabetic shoes at some point in the interim. Stable although still not well-managed diabetes patient will do a better job or try to better job keep your blood glucose is better and lower her A1c down to the 6.5 range is recommended recheck in 6 months next  [employer] DP

## 2014-03-02 NOTE — Patient Instructions (Signed)
Diabetes and Foot Care Diabetes may cause you to have problems because of poor blood supply (circulation) to your feet and legs. This may cause the skin on your feet to become thinner, break easier, and heal more slowly. Your skin may become dry, and the skin may peel and crack. You may also have nerve damage in your legs and feet causing decreased feeling in them. You may not notice minor injuries to your feet that could lead to infections or more serious problems. Taking care of your feet is one of the most important things you can do for yourself.  HOME CARE INSTRUCTIONS  Wear shoes at all times, even in the house. Do not go barefoot. Bare feet are easily injured.  Check your feet daily for blisters, cuts, and redness. If you cannot see the bottom of your feet, use a mirror or ask someone for help.  Wash your feet with warm water (do not use hot water) and mild soap. Then pat your feet and the areas between your toes until they are completely dry. Do not soak your feet as this can dry your skin.  Apply a moisturizing lotion or petroleum jelly (that does not contain alcohol and is unscented) to the skin on your feet and to dry, brittle toenails. Do not apply lotion between your toes.  Trim your toenails straight across. Do not dig under them or around the cuticle. File the edges of your nails with an emery board or nail file.  Do not cut corns or calluses or try to remove them with medicine.  Wear clean socks or stockings every day. Make sure they are not too tight. Do not wear knee-high stockings since they may decrease blood flow to your legs.  Wear shoes that fit properly and have enough cushioning. To break in new shoes, wear them for just a few hours a day. This prevents you from injuring your feet. Always look in your shoes before you put them on to be sure there are no objects inside.  Do not cross your legs. This may decrease the blood flow to your feet.  If you find a minor scrape,  cut, or break in the skin on your feet, keep it and the skin around it clean and dry. These areas may be cleansed with mild soap and water. Do not cleanse the area with peroxide, alcohol, or iodine.  When you remove an adhesive bandage, be sure not to damage the skin around it.  If you have a wound, look at it several times a day to make sure it is healing.  Do not use heating pads or hot water bottles. They may burn your skin. If you have lost feeling in your feet or legs, you may not know it is happening until it is too late.  Make sure your health care provider performs a complete foot exam at least annually or more often if you have foot problems. Report any cuts, sores, or bruises to your health care provider immediately. SEEK MEDICAL CARE IF:   You have an injury that is not healing.  You have cuts or breaks in the skin.  You have an ingrown nail.  You notice redness on your legs or feet.  You feel burning or tingling in your legs or feet.  You have pain or cramps in your legs and feet.  Your legs or feet are numb.  Your feet always feel cold. SEEK IMMEDIATE MEDICAL CARE IF:   There is increasing redness,   swelling, or pain in or around a wound.  There is a red line that goes up your leg.  Pus is coming from a wound.  You develop a fever or as directed by your health care provider.  You notice a bad smell coming from an ulcer or wound. Document Released: 09/18/2000 Document Revised: 05/24/2013 Document Reviewed: 02/28/2013 [employer] Patient Information 2014 [employer].  

## 2014-03-30 ENCOUNTER — Ambulatory Visit: Payer: Medicaid Other

## 2014-04-06 ENCOUNTER — Other Ambulatory Visit: Payer: Self-pay

## 2014-04-16 ENCOUNTER — Encounter (HOSPITAL_COMMUNITY): Payer: Self-pay | Admitting: Emergency Medicine

## 2014-04-16 ENCOUNTER — Emergency Department (HOSPITAL_COMMUNITY)
Admission: EM | Admit: 2014-04-16 | Discharge: 2014-04-16 | Disposition: A | Discharge status: Home / Self Care | Payer: Medicaid Other | Attending: Emergency Medicine | Admitting: Emergency Medicine

## 2014-04-16 DIAGNOSIS — J3489 Other specified disorders of nose and nasal sinuses: Secondary | ICD-10-CM | POA: Insufficient documentation

## 2014-04-16 DIAGNOSIS — Z90710 Acquired absence of both cervix and uterus: Secondary | ICD-10-CM | POA: Insufficient documentation

## 2014-04-16 DIAGNOSIS — Z79899 Other long term (current) drug therapy: Secondary | ICD-10-CM | POA: Insufficient documentation

## 2014-04-16 DIAGNOSIS — Z9089 Acquired absence of other organs: Secondary | ICD-10-CM | POA: Insufficient documentation

## 2014-04-16 DIAGNOSIS — K219 Gastro-esophageal reflux disease without esophagitis: Secondary | ICD-10-CM | POA: Insufficient documentation

## 2014-04-16 DIAGNOSIS — E119 Type 2 diabetes mellitus without complications: Secondary | ICD-10-CM | POA: Insufficient documentation

## 2014-04-16 DIAGNOSIS — M5430 Sciatica, unspecified side: Secondary | ICD-10-CM | POA: Insufficient documentation

## 2014-04-16 DIAGNOSIS — M5441 Lumbago with sciatica, right side: Secondary | ICD-10-CM

## 2014-04-16 DIAGNOSIS — Z8543 Personal history of malignant neoplasm of ovary: Secondary | ICD-10-CM | POA: Insufficient documentation

## 2014-04-16 DIAGNOSIS — Z87442 Personal history of urinary calculi: Secondary | ICD-10-CM | POA: Insufficient documentation

## 2014-04-16 DIAGNOSIS — F329 Major depressive disorder, single episode, unspecified: Secondary | ICD-10-CM | POA: Insufficient documentation

## 2014-04-16 DIAGNOSIS — Z880 Allergy status to penicillin: Secondary | ICD-10-CM | POA: Insufficient documentation

## 2014-04-16 DIAGNOSIS — Z8701 Personal history of pneumonia (recurrent): Secondary | ICD-10-CM | POA: Insufficient documentation

## 2014-04-16 DIAGNOSIS — I10 Essential (primary) hypertension: Secondary | ICD-10-CM | POA: Insufficient documentation

## 2014-04-16 DIAGNOSIS — F3289 Other specified depressive episodes: Secondary | ICD-10-CM | POA: Insufficient documentation

## 2014-04-16 DIAGNOSIS — J45909 Unspecified asthma, uncomplicated: Secondary | ICD-10-CM | POA: Insufficient documentation

## 2014-04-16 DIAGNOSIS — Z794 Long term (current) use of insulin: Secondary | ICD-10-CM | POA: Insufficient documentation

## 2014-04-16 DIAGNOSIS — R109 Unspecified abdominal pain: Secondary | ICD-10-CM | POA: Insufficient documentation

## 2014-04-16 DIAGNOSIS — Z87891 Personal history of nicotine dependence: Secondary | ICD-10-CM | POA: Insufficient documentation

## 2014-04-16 LAB — URINALYSIS, ROUTINE W REFLEX MICROSCOPIC
Bilirubin Urine: NEGATIVE
Glucose, UA: NEGATIVE mg/dL
Hgb urine dipstick: NEGATIVE
KETONES UR: NEGATIVE mg/dL
LEUKOCYTES UA: NEGATIVE
NITRITE: NEGATIVE
PROTEIN: NEGATIVE mg/dL
Specific Gravity, Urine: 1.01 (ref 1.005–1.030)
UROBILINOGEN UA: 0.2 mg/dL (ref 0.0–1.0)
pH: 7 (ref 5.0–8.0)

## 2014-04-16 MED ORDER — METHOCARBAMOL 500 MG PO TABS: 500.0000 mg | ORAL_TABLET | Freq: Three times a day (TID) | ORAL | Status: DC

## 2014-04-16 MED ORDER — HYDROCODONE-ACETAMINOPHEN 5-325 MG PO TABS: 1.0000 | ORAL_TABLET | ORAL | Status: DC | PRN

## 2014-04-16 MED ORDER — HYDROCODONE-ACETAMINOPHEN 5-325 MG PO TABS
1.0000 | ORAL_TABLET | Freq: Once | ORAL | Status: AC
  Administered 2014-04-16: 1 via ORAL
  Filled 2014-04-16: qty 1

## 2014-04-16 MED ORDER — FEXOFENADINE-PSEUDOEPHED ER 60-120 MG PO TB12: 1.0000 | ORAL_TABLET | Freq: Two times a day (BID) | ORAL | Status: DC

## 2014-04-16 MED ORDER — METHOCARBAMOL 500 MG PO TABS
500.0000 mg | ORAL_TABLET | Freq: Once | ORAL | Status: AC
  Administered 2014-04-16: 500 mg via ORAL
  Filled 2014-04-16: qty 1

## 2014-04-16 NOTE — ED Provider Notes (Signed)
CSN: [id number]     Arrival date & time 04/16/14  1252 History  This chart was scribed for non-physician practitioner, Lily Kocher, PA-C working with Alfonzo Feller, DO by Einar Pheasant, ED scribe. This patient was seen in room APFT23/APFT23 and the patient's care was started at 4:57 PM.    Chief Complaint  Patient presents with  . Sinusitis  . Dysuria   The history is provided by the patient. No language interpreter was used.   HPI Comments: Destiny Robinson is a 58 y.o. female who presents to the Emergency Department complaining of a sinus infection that started approximately 4 days ago. Pt is also complaining of associated dysuria, otalgia, and back pain. She states that this morning upon waking she noticed that her eyes were "crusted over". Pt also states that she had to cough up "greeen colored" sputum this morning. She reports a fever with a measured TMAX of 101. Current ED temperature is 98. Pt reports taking Ibuprofen and Tylenol which relieved her fever. Ms. Gannett reports a history of arthritis in her feet. Denies any abdominal pain, nausea, emesis, SOB, recent falls, recent injuries, or chest pain.   Past Medical History  Diagnosis Date  . Hypertension   . Hypercholesterolemia   . Bronchitis   . Pancreatitis   . Complication of anesthesia 12/11/11    "angry, mean, hateful after" endoscopy & colonoscopy  . Asthma ~ 1991    "come and go"  . Pneumonia 06/2011; 07/2011  . Diabetes mellitus type 2, controlled   . GERD (gastroesophageal reflux disease)   . GI bleeding 12/21/11    ""that's why I'm here"  . Hx of colonoscopy with polypectomy 12/11/11    "took out 7; couldn't get #8, that one was precancerous"  . Depression   . 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 Paralysis gastric   . Kidney stones    Past Surgical History  Procedure Laterality Date  . Cesarean section  1981; 1986  . Esophagogastroduodenoscopy  12/11/2011    Procedure:  ESOPHAGOGASTRODUODENOSCOPY (EGD);  Surgeon: Missy Sabins, MD;  Location: [facility] ENDOSCOPY;  Service: Endoscopy;  Laterality: N/A;  . Colonoscopy  12/11/2011    Procedure: COLONOSCOPY;  Surgeon: Missy Sabins, MD;  Location: [facility];  Service: Endoscopy;  Laterality: N/A;  . Abdominal hysterectomy  2002  . Cholecystectomy  ~ 2002  . Tonsillectomy and adenoidectomy  1988  . Appendectomy  ~ 2002    "w/hysterectomy"  . Colonoscopy  04/26/2012    Procedure: COLONOSCOPY;  Surgeon: Wonda Horner, MD;  Location: [facility] ENDOSCOPY;  Service: Endoscopy;  Laterality: N/A;  apc  . Hernia repair     Family History  Problem Relation Age of Onset  . Colon cancer Sister   . Alzheimer's disease Mother   . Heart disease Father    History  Substance Use Topics  . Smoking status: Former Smoker -- 0.12 packs/day for 6 years    Types: Cigarettes    Quit date: 12/09/1996  . Smokeless tobacco: Never Used  . Alcohol Use: No   OB History   Grav Para Term Preterm Abortions TAB SAB Ect Mult Living                 Review of Systems  Constitutional: Negative for fever and chills.  HENT: Positive for congestion and sinus pressure.   Respiratory: Negative for shortness of breath.   Cardiovascular: Negative for chest  pain.  Gastrointestinal: Positive for abdominal pain.  Genitourinary: Positive for dysuria.  Musculoskeletal: Positive for arthralgias. Negative for back pain.  All other systems reviewed and are negative.  Allergies  Codeine; Penicillins; Reglan; Sulfa antibiotics; Xifaxan; and Zocor  Home Medications   Prior to Admission medications   Medication Sig Start Date End Date Taking? Authorizing Provider  albuterol (PROVENTIL) (2.5 MG/3ML) 0.083% nebulizer solution Take 2.5 mg by nebulization 4 (four) times daily as needed for wheezing.    Historical Provider, MD  amLODipine (NORVASC) 5 MG tablet Take 5 mg by mouth every evening.    Historical Provider, MD  Choline Fenofibrate (TRILIPIX) 135 MG  capsule Take 135 mg by mouth daily.    Historical Provider, MD  dexlansoprazole (DEXILANT) 60 MG capsule Take 60 mg by mouth daily.    Historical Provider, MD  dicyclomine (BENTYL) 10 MG capsule Take 10 mg by mouth 4 (four) times daily -  before meals and at bedtime.    Historical Provider, MD  ezetimibe-simvastatin (VYTORIN) 10-20 MG per tablet Take 1 tablet by mouth daily.    Historical Provider, MD  fluconazole (DIFLUCAN) 100 MG tablet Take 100 mg by mouth at bedtime.     Historical Provider, MD  gabapentin (NEURONTIN) 300 MG capsule TAKE (2) CAPSULES THREE TIMES DAILY. 9AM, 3PM, 9PM    Richard Sikora, DPM  glipiZIDE (GLUCOTROL) 10 MG tablet Take 10 mg by mouth daily before breakfast.    Historical Provider, MD  hydrochlorothiazide (HYDRODIURIL) 25 MG tablet Take 25 mg by mouth daily.    Historical Provider, MD  insulin detemir (LEVEMIR) 100 UNIT/ML injection Inject 65 Units into the skin at bedtime.     Historical Provider, MD  LORazepam (ATIVAN) 0.5 MG tablet Take 0.5 mg by mouth 3 (three) times daily as needed. FOR ANXIETY    Historical Provider, MD  mometasone-formoterol [facility]) 100-5 MCG/ACT AERO 2 puffs then rinse mouth, twice daily 08/10/13   Deneise Lever, MD  oxycodone (OXY-IR) 5 MG capsule Take 5 mg by mouth daily.    Historical Provider, MD  Pancrelipase, Lip-Prot-Amyl, (CREON) 24000 UNITS CPEP Take 2 capsules by mouth 4 (four) times daily.     Historical Provider, MD  pantoprazole (PROTONIX) 40 MG tablet Take 40 mg by mouth daily.     Historical Provider, MD  potassium chloride (K-DUR) 10 MEQ tablet Take 10 mEq by mouth every morning.    Historical Provider, MD  PROAIR HFA 108 (90 BASE) MCG/ACT inhaler USE 2 PUFFS EVERY 6 HOURS AS NEEDED FOR WHEEZING    Clinton D Young, MD  Probiotic Product (ALIGN PO) Take 1 capsule by mouth daily.    Historical Provider, MD  promethazine (PHENERGAN) 25 MG tablet Take 25 mg by mouth every 6 (six) hours as needed.    Historical Provider, MD   valsartan (DIOVAN) 160 MG tablet Take 160 mg by mouth daily.    Historical Provider, MD  venlafaxine (EFFEXOR) 75 MG tablet Take 75 mg by mouth 2 (two) times daily.    Historical Provider, MD   BP 133/81  Pulse 95  Temp(Src) 98 F (36.7 C) (Oral)  Resp 18  Ht 5\' 2"  (1.575 m)  Wt 212 lb (96.163 kg)  BMI 38.77 kg/m2  SpO2 95%  Physical Exam  Nursing note and vitals reviewed. Constitutional: She is oriented to person, place, and time. She appears well-developed and well-nourished. No distress.  HENT:  Head: Normocephalic and atraumatic.  Right Ear: External ear normal.  Left Ear: External  ear normal.  Mouth/Throat: No oropharyngeal exudate.  No temperature changes in the face. No loss of nasal labial fold. External auditory canals clear. TM without any problems. Uvula mildly enlarged.  Eyes: Conjunctivae and EOM are normal.  Minimal redness of the conjunctiva.  Neck: Normal range of motion. Neck supple. No thyromegaly present.  Cardiovascular: Normal rate and regular rhythm.  Exam reveals no gallop and no friction rub.   No murmur heard. Pulmonary/Chest: Effort normal and breath sounds normal. No stridor. No respiratory distress. She has no wheezes. She has no rales.  No tracheal stridor.  Musculoskeletal: Normal range of motion.  Capillary refill is less than two seconds. No edema.   Pain to paraspinal area of the lumbar region right and left. Pain over the lower rib area posteriorly.   Lymphadenopathy:    She has no cervical adenopathy.  Neurological: She is alert and oriented to person, place, and time.  Skin: Skin is warm and dry.  Psychiatric: She has a normal mood and affect. Her behavior is normal.    ED Course  Procedures (including critical care time)  DIAGNOSTIC STUDIES: Oxygen Saturation is 95% on RA, low by my interpretation.    COORDINATION OF CARE: 4:05 PM- Will prescribe a short term course of steroids. Pt advised of plan for treatment and pt  agrees.  Labs Review Labs Reviewed  URINALYSIS, ROUTINE W REFLEX MICROSCOPIC    Imaging Review No results found.   EKG Interpretation None      MDM Patient is tender over the right and left maxillary sinuses. Mild nasal congestion present. Vital signs are well within normal limits, in particular temperature is within normal limits.  The patient demonstrates pain over the lower lumbar area. Not in the kidney area as she had supposed. The urine is well within normal limits. The patient has a history of back problems, as well as" sciatica". Suspect that this back pain is related to degenerative changes, and/or sciatica.  Patient will be treated with Allegra-D, Norco, and Robaxin. Patient is to see [name] for additional evaluation and management.    Final diagnoses:  None    *I have reviewed nursing notes, vital signs, and all appropriate lab and imaging results for this patient.**  I personally performed the services described in this documentation, which was scribed in my presence. The recorded information has been reviewed and is accurate.    Lenox Ahr, PA-C 04/16/14 [phone]

## 2014-04-16 NOTE — ED Notes (Signed)
Sinus infection and dysuria for several day.

## 2014-04-16 NOTE — Discharge Instructions (Signed)
Your urine test is negative for infection or kidney stone. I suspect your back pain is probably arthritis, and/or degenerative disc related.  Please use Allegra-D for ear congestion. Please use Robaxin for back spasm pain, Norco for pain. This medication may cause drowsiness, please use with caution. Please see [name] for followup and recheck. Back Pain, Adult Back pain is very common. The pain often gets better over time. The cause of back pain is usually not dangerous. Most people can learn to manage their back pain on their own.  HOME CARE   Stay active. Start with short walks on flat ground if you can. Try to walk farther each day.  Do not sit, drive, or stand in one place for more than 30 minutes. Do not stay in bed.  Do not avoid exercise or work. Activity can help your back heal faster.  Be careful when you bend or lift an object. Bend at your knees, keep the object close to you, and do not twist.  Sleep on a firm mattress. Lie on your side, and bend your knees. If you lie on your back, put a pillow under your knees.  Only take medicines as told by your doctor.  Put ice on the injured area.  Put ice in a plastic bag.  Place a towel between your skin and the bag.  Leave the ice on for 15-20 minutes, 03-04 times a day for the first 2 to 3 days. After that, you can switch between ice and heat packs.  Ask your doctor about back exercises or massage.  Avoid feeling anxious or stressed. Find good ways to deal with stress, such as exercise. GET HELP RIGHT AWAY IF:   Your pain does not go away with rest or medicine.  Your pain does not go away in 1 week.  You have new problems.  You do not feel well.  The pain spreads into your legs.  You cannot control when you poop (bowel movement) or pee (urinate).  Your arms or legs feel weak or lose feeling (numbness).  You feel sick to your stomach (nauseous) or throw up (vomit).  You have belly (abdominal) pain.  You feel  like you may pass out (faint). MAKE SURE YOU:   Understand these instructions.  Will watch your condition.  Will get help right away if you are not doing well or get worse. Document Released: 03/09/2008 Document Revised: 12/14/2011 Document Reviewed: 02/09/2011 [facility] Patient Information 2015 [place], [place]. This information is not intended to replace advice given to you by your health care provider. Make sure you discuss any questions you have with your health care provider.

## 2014-04-17 NOTE — ED Provider Notes (Signed)
Medical screening examination/treatment/procedure(s) were performed by non-physician practitioner and as supervising physician I was immediately available for consultation/collaboration.   EKG Interpretation None        Alfonzo Feller, DO 04/17/14 2351

## 2014-05-03 ENCOUNTER — Other Ambulatory Visit: Payer: Self-pay

## 2014-05-04 NOTE — Telephone Encounter (Signed)
Okay to refill plus 2 additional refills, then patient will need to be seen before any additional refills.

## 2014-06-05 ENCOUNTER — Other Ambulatory Visit (HOSPITAL_COMMUNITY): Payer: Self-pay | Admitting: Family Medicine

## 2014-06-05 DIAGNOSIS — Z139 Encounter for screening, unspecified: Secondary | ICD-10-CM

## 2014-06-08 ENCOUNTER — Ambulatory Visit (HOSPITAL_COMMUNITY)
Admission: RE | Admit: 2014-06-08 | Discharge: 2014-06-08 | Disposition: A | Discharge status: Home / Self Care | Payer: Medicare Other | Source: Ambulatory Visit | Attending: Family Medicine | Admitting: Family Medicine

## 2014-06-08 DIAGNOSIS — Z139 Encounter for screening, unspecified: Secondary | ICD-10-CM

## 2014-06-08 DIAGNOSIS — Z1231 Encounter for screening mammogram for malignant neoplasm of breast: Secondary | ICD-10-CM | POA: Diagnosis present

## 2014-08-03 ENCOUNTER — Other Ambulatory Visit: Payer: Self-pay

## 2014-08-09 ENCOUNTER — Other Ambulatory Visit: Payer: Self-pay

## 2014-08-10 ENCOUNTER — Ambulatory Visit: Payer: Medicaid Other | Admitting: Internal Medicine

## 2014-08-10 ENCOUNTER — Ambulatory Visit: Payer: Self-pay | Admitting: Internal Medicine

## 2014-09-04 ENCOUNTER — Ambulatory Visit: Payer: Medicaid Other

## 2014-09-07 ENCOUNTER — Ambulatory Visit: Payer: Medicaid Other

## 2014-09-11 ENCOUNTER — Ambulatory Visit: Payer: Medicaid Other

## 2014-10-01 ENCOUNTER — Observation Stay (HOSPITAL_COMMUNITY): Payer: Medicare Other

## 2014-10-01 ENCOUNTER — Emergency Department (HOSPITAL_COMMUNITY): Payer: Medicare Other

## 2014-10-01 ENCOUNTER — Encounter (HOSPITAL_COMMUNITY): Payer: Self-pay

## 2014-10-01 ENCOUNTER — Observation Stay (HOSPITAL_COMMUNITY)
Admission: EM | Admit: 2014-10-01 | Discharge: 2014-10-03 | Disposition: A | Discharge status: Home / Self Care | Payer: Medicare Other | Attending: Internal Medicine | Admitting: Internal Medicine

## 2014-10-01 DIAGNOSIS — Z8673 Personal history of transient ischemic attack (TIA), and cerebral infarction without residual deficits: Secondary | ICD-10-CM | POA: Diagnosis not present

## 2014-10-01 DIAGNOSIS — J45909 Unspecified asthma, uncomplicated: Secondary | ICD-10-CM | POA: Insufficient documentation

## 2014-10-01 DIAGNOSIS — R208 Other disturbances of skin sensation: Secondary | ICD-10-CM | POA: Diagnosis not present

## 2014-10-01 DIAGNOSIS — Z885 Allergy status to narcotic agent status: Secondary | ICD-10-CM | POA: Insufficient documentation

## 2014-10-01 DIAGNOSIS — K3184 Gastroparesis: Secondary | ICD-10-CM | POA: Diagnosis not present

## 2014-10-01 DIAGNOSIS — R059 Cough, unspecified: Secondary | ICD-10-CM

## 2014-10-01 DIAGNOSIS — Z882 Allergy status to sulfonamides status: Secondary | ICD-10-CM | POA: Insufficient documentation

## 2014-10-01 DIAGNOSIS — I6523 Occlusion and stenosis of bilateral carotid arteries: Secondary | ICD-10-CM | POA: Insufficient documentation

## 2014-10-01 DIAGNOSIS — F418 Other specified anxiety disorders: Secondary | ICD-10-CM | POA: Diagnosis not present

## 2014-10-01 DIAGNOSIS — Z6836 Body mass index (BMI) 36.0-36.9, adult: Secondary | ICD-10-CM | POA: Insufficient documentation

## 2014-10-01 DIAGNOSIS — Z794 Long term (current) use of insulin: Secondary | ICD-10-CM | POA: Insufficient documentation

## 2014-10-01 DIAGNOSIS — E119 Type 2 diabetes mellitus without complications: Secondary | ICD-10-CM

## 2014-10-01 DIAGNOSIS — E1340 Other specified diabetes mellitus with diabetic neuropathy, unspecified: Secondary | ICD-10-CM

## 2014-10-01 DIAGNOSIS — E785 Hyperlipidemia, unspecified: Secondary | ICD-10-CM | POA: Insufficient documentation

## 2014-10-01 DIAGNOSIS — E1140 Type 2 diabetes mellitus with diabetic neuropathy, unspecified: Secondary | ICD-10-CM | POA: Insufficient documentation

## 2014-10-01 DIAGNOSIS — M25552 Pain in left hip: Secondary | ICD-10-CM | POA: Insufficient documentation

## 2014-10-01 DIAGNOSIS — E1143 Type 2 diabetes mellitus with diabetic autonomic (poly)neuropathy: Secondary | ICD-10-CM | POA: Diagnosis not present

## 2014-10-01 DIAGNOSIS — I10 Essential (primary) hypertension: Secondary | ICD-10-CM | POA: Diagnosis not present

## 2014-10-01 DIAGNOSIS — Z7982 Long term (current) use of aspirin: Secondary | ICD-10-CM | POA: Diagnosis not present

## 2014-10-01 DIAGNOSIS — F329 Major depressive disorder, single episode, unspecified: Secondary | ICD-10-CM | POA: Insufficient documentation

## 2014-10-01 DIAGNOSIS — K219 Gastro-esophageal reflux disease without esophagitis: Secondary | ICD-10-CM | POA: Diagnosis not present

## 2014-10-01 DIAGNOSIS — R209 Unspecified disturbances of skin sensation: Secondary | ICD-10-CM

## 2014-10-01 DIAGNOSIS — Z880 Allergy status to penicillin: Secondary | ICD-10-CM | POA: Diagnosis not present

## 2014-10-01 DIAGNOSIS — E781 Pure hyperglyceridemia: Secondary | ICD-10-CM | POA: Diagnosis not present

## 2014-10-01 DIAGNOSIS — R05 Cough: Secondary | ICD-10-CM

## 2014-10-01 DIAGNOSIS — Z87891 Personal history of nicotine dependence: Secondary | ICD-10-CM | POA: Insufficient documentation

## 2014-10-01 DIAGNOSIS — Z888 Allergy status to other drugs, medicaments and biological substances status: Secondary | ICD-10-CM | POA: Insufficient documentation

## 2014-10-01 DIAGNOSIS — I639 Cerebral infarction, unspecified: Secondary | ICD-10-CM | POA: Diagnosis present

## 2014-10-01 LAB — CBC
HEMATOCRIT: 42.1 % (ref 36.0–46.0)
HEMOGLOBIN: 13.5 g/dL (ref 12.0–15.0)
MCH: 28.9 pg (ref 26.0–34.0)
MCHC: 32.1 g/dL (ref 30.0–36.0)
MCV: 90.1 fL (ref 78.0–100.0)
Platelets: 180 10*3/uL (ref 150–400)
RBC: 4.67 MIL/uL (ref 3.87–5.11)
RDW: 14.4 % (ref 11.5–15.5)
WBC: 6.7 10*3/uL (ref 4.0–10.5)

## 2014-10-01 LAB — COMPREHENSIVE METABOLIC PANEL
ALBUMIN: 4.4 g/dL (ref 3.5–5.2)
ALK PHOS: 46 U/L (ref 39–117)
ALT: 24 U/L (ref 0–35)
ANION GAP: 13 (ref 5–15)
AST: 53 U/L — ABNORMAL HIGH (ref 0–37)
BUN: 18 mg/dL (ref 6–23)
CO2: 24 mmol/L (ref 19–32)
Calcium: 9.3 mg/dL (ref 8.4–10.5)
Chloride: 103 mEq/L (ref 96–112)
Creatinine, Ser: 0.57 mg/dL (ref 0.50–1.10)
GFR calc Af Amer: >90 mL/min (ref >90)
GFR calc non Af Amer: >90 mL/min (ref >90)
Glucose, Bld: 86 mg/dL (ref 70–99)
POTASSIUM: 3.8 mmol/L (ref 3.5–5.1)
Sodium: 140 mmol/L (ref 135–145)
TOTAL PROTEIN: 7.6 g/dL (ref 6.0–8.3)
Total Bilirubin: 0.3 mg/dL (ref 0.3–1.2)

## 2014-10-01 LAB — TROPONIN I: Troponin I: <0.03 ng/mL (ref <0.031)

## 2014-10-01 LAB — GLUCOSE, CAPILLARY
GLUCOSE-CAPILLARY: 116 mg/dL — AB (ref 70–99)
GLUCOSE-CAPILLARY: 104 mg/dL — AB (ref 70–99)

## 2014-10-01 LAB — URINALYSIS, ROUTINE W REFLEX MICROSCOPIC
Bilirubin Urine: NEGATIVE
Glucose, UA: NEGATIVE mg/dL
Hgb urine dipstick: NEGATIVE
Ketones, ur: NEGATIVE mg/dL
Leukocytes, UA: NEGATIVE
NITRITE: NEGATIVE
Protein, ur: NEGATIVE mg/dL
SPECIFIC GRAVITY, URINE: 1.01 (ref 1.005–1.030)
Urobilinogen, UA: 0.2 mg/dL (ref 0.0–1.0)
pH: 6.5 (ref 5.0–8.0)

## 2014-10-01 MED ORDER — SODIUM CHLORIDE 0.9 % IJ SOLN
3.0000 mL | Freq: Two times a day (BID) | INTRAMUSCULAR | Status: DC
  Administered 2014-10-02 – 2014-10-03 (×3): 3 mL via INTRAVENOUS

## 2014-10-01 MED ORDER — ACETAMINOPHEN 325 MG PO TABS
650.0000 mg | ORAL_TABLET | Freq: Four times a day (QID) | ORAL | Status: DC | PRN
  Administered 2014-10-01 – 2014-10-03 (×5): 650 mg via ORAL
  Filled 2014-10-01 (×5): qty 2

## 2014-10-01 MED ORDER — ONDANSETRON HCL 4 MG/2ML IJ SOLN
4.0000 mg | Freq: Four times a day (QID) | INTRAMUSCULAR | Status: DC | PRN
Start: 2014-10-01 — End: 2014-10-03

## 2014-10-01 MED ORDER — VENLAFAXINE HCL 75 MG PO TABS
75.0000 mg | ORAL_TABLET | Freq: Two times a day (BID) | ORAL | Status: DC
  Administered 2014-10-01 – 2014-10-03 (×4): 75 mg via ORAL
  Filled 2014-10-01 (×5): qty 1

## 2014-10-01 MED ORDER — GABAPENTIN 300 MG PO CAPS
300.0000 mg | ORAL_CAPSULE | Freq: Two times a day (BID) | ORAL | Status: DC
  Administered 2014-10-01 – 2014-10-03 (×4): 300 mg via ORAL
  Filled 2014-10-01 (×4): qty 1

## 2014-10-01 MED ORDER — ALBUTEROL SULFATE (2.5 MG/3ML) 0.083% IN NEBU: 2.5000 mg | INHALATION_SOLUTION | Freq: Four times a day (QID) | RESPIRATORY_TRACT | Status: DC | PRN

## 2014-10-01 MED ORDER — LORAZEPAM 2 MG/ML IJ SOLN
1.0000 mg | Freq: Once | INTRAMUSCULAR | Status: AC | PRN
Start: 2014-10-01 — End: 2014-10-01
  Administered 2014-10-01: 1 mg via INTRAVENOUS

## 2014-10-01 MED ORDER — ONDANSETRON HCL 4 MG PO TABS
4.0000 mg | ORAL_TABLET | Freq: Four times a day (QID) | ORAL | Status: DC | PRN
  Administered 2014-10-01 – 2014-10-03 (×4): 4 mg via ORAL
  Filled 2014-10-01 (×4): qty 1

## 2014-10-01 MED ORDER — INSULIN ASPART 100 UNIT/ML ~~LOC~~ SOLN: 0.0000 [IU] | Freq: Every day | SUBCUTANEOUS | Status: DC

## 2014-10-01 MED ORDER — LOSARTAN POTASSIUM 50 MG PO TABS
100.0000 mg | ORAL_TABLET | Freq: Every day | ORAL | Status: DC
  Administered 2014-10-02 – 2014-10-03 (×2): 100 mg via ORAL
  Filled 2014-10-01 (×2): qty 2

## 2014-10-01 MED ORDER — ENSURE COMPLETE PO LIQD
237.0000 mL | Freq: Two times a day (BID) | ORAL | Status: DC
  Administered 2014-10-02 (×2): 237 mL via ORAL

## 2014-10-01 MED ORDER — CO Q-10 100 MG PO CAPS: 100.0000 mg | ORAL_CAPSULE | Freq: Every day | ORAL | Status: DC

## 2014-10-01 MED ORDER — AMLODIPINE BESYLATE 5 MG PO TABS
5.0000 mg | ORAL_TABLET | Freq: Every evening | ORAL | Status: DC
  Administered 2014-10-01 – 2014-10-02 (×2): 5 mg via ORAL
  Filled 2014-10-01 (×4): qty 1

## 2014-10-01 MED ORDER — ACETAMINOPHEN 650 MG RE SUPP: 650.0000 mg | Freq: Four times a day (QID) | RECTAL | Status: DC | PRN

## 2014-10-01 MED ORDER — PANCRELIPASE (LIP-PROT-AMYL) 12000-38000 UNITS PO CPEP
24000.0000 [IU] | ORAL_CAPSULE | Freq: Two times a day (BID) | ORAL | Status: DC
  Administered 2014-10-01 – 2014-10-03 (×4): 24000 [IU] via ORAL
  Filled 2014-10-01 (×5): qty 2

## 2014-10-01 MED ORDER — ASPIRIN 325 MG PO TABS
325.0000 mg | ORAL_TABLET | Freq: Every day | ORAL | Status: DC
  Administered 2014-10-01 – 2014-10-03 (×3): 325 mg via ORAL
  Filled 2014-10-01 (×3): qty 1

## 2014-10-01 MED ORDER — PANTOPRAZOLE SODIUM 40 MG PO TBEC
40.0000 mg | DELAYED_RELEASE_TABLET | Freq: Two times a day (BID) | ORAL | Status: DC
  Administered 2014-10-01 – 2014-10-03 (×4): 40 mg via ORAL
  Filled 2014-10-01 (×4): qty 1

## 2014-10-01 MED ORDER — METFORMIN HCL 500 MG PO TABS
1000.0000 mg | ORAL_TABLET | Freq: Two times a day (BID) | ORAL | Status: DC
  Filled 2014-10-01 (×2): qty 2

## 2014-10-01 MED ORDER — HEPARIN SODIUM (PORCINE) 5000 UNIT/ML IJ SOLN
5000.0000 [IU] | Freq: Three times a day (TID) | INTRAMUSCULAR | Status: DC
  Administered 2014-10-01 – 2014-10-03 (×5): 5000 [IU] via SUBCUTANEOUS
  Filled 2014-10-01 (×6): qty 1

## 2014-10-01 MED ORDER — GLIPIZIDE 10 MG PO TABS
10.0000 mg | ORAL_TABLET | Freq: Every day | ORAL | Status: DC
  Administered 2014-10-02 – 2014-10-03 (×2): 10 mg via ORAL
  Filled 2014-10-01 (×2): qty 1

## 2014-10-01 MED ORDER — HYDROCHLOROTHIAZIDE 25 MG PO TABS
25.0000 mg | ORAL_TABLET | Freq: Every day | ORAL | Status: DC
  Administered 2014-10-02 – 2014-10-03 (×2): 25 mg via ORAL
  Filled 2014-10-01 (×2): qty 1

## 2014-10-01 MED ORDER — LORAZEPAM 2 MG/ML IJ SOLN
INTRAMUSCULAR | Status: AC
  Filled 2014-10-01: qty 1

## 2014-10-01 MED ORDER — INSULIN ASPART 100 UNIT/ML ~~LOC~~ SOLN
0.0000 [IU] | Freq: Three times a day (TID) | SUBCUTANEOUS | Status: DC
  Administered 2014-10-02: 3 [IU] via SUBCUTANEOUS
  Administered 2014-10-03: 2 [IU] via SUBCUTANEOUS

## 2014-10-01 MED ORDER — PROMETHAZINE HCL 25 MG PO TABS: 25.0000 mg | ORAL_TABLET | Freq: Four times a day (QID) | ORAL | Status: DC | PRN

## 2014-10-01 MED ORDER — INSULIN NPH (HUMAN) (ISOPHANE) 100 UNIT/ML ~~LOC~~ SUSP
44.0000 [IU] | Freq: Every day | SUBCUTANEOUS | Status: DC
  Administered 2014-10-01 – 2014-10-02 (×2): 44 [IU] via SUBCUTANEOUS
  Filled 2014-10-01: qty 10

## 2014-10-01 NOTE — Progress Notes (Signed)
PHARMACIST - PHYSICIAN ORDER COMMUNICATION  CONCERNING: P&T Medication Policy on Herbal Medications  DESCRIPTION:  This patient's order for:  Co Q 10  has been noted.  This product(s) is classified as an "herbal" or natural product. Due to a lack of definitive safety studies or FDA approval, nonstandard manufacturing practices, plus the potential risk of unknown drug-drug interactions while on inpatient medications, the Pharmacy and Therapeutics Committee does not permit the use of "herbal" or natural products of this type within [facility].   ACTION TAKEN: The pharmacy department is unable to verify this order at this time and your patient has been informed of this safety policy. Please reevaluate patient's clinical condition at discharge and address if the herbal or natural product(s) should be resumed at that time.  Minda Ditto PharmD Pager [phone] 10/01/2014, 4:01 PM

## 2014-10-01 NOTE — H&P (Signed)
[facility] History and Physical  Destiny Robinson KDT:[id number] DOB: [birth year] DOA: 10/01/2014  Referring physician: Emergency department PCP: [name], MD  Specialists:   Chief Complaint: L sided numbness  HPI: Destiny Robinson is a 58 y.o. female  With a hx of htn, dm with neuropathy who presents with complaints of persistent L sided numbness primarily involving the arm and leg, sparing the face. Symptoms initially started one day prior to admit, lasting no more than 70min. Symptoms again returned upon awakening on the am of admit. The patient then presented to the ED where the patient underwent CT head demonstrating age-indeterminate lacunar infarcts in the R and L basal ganglia. Neurology was consulted with recommendations for admission to [facility] and to complete formal stroke work up. Hospitalist was consulted for consideration for admission.  Review of Systems:  Per above, the remainder of the 10pt ros reviewed and are neg  Past Medical History  Diagnosis Date  . Hypertension   . Hypercholesterolemia   . Bronchitis   . Pancreatitis   . Complication of anesthesia 12/11/11    "angry, mean, hateful after" endoscopy & colonoscopy  . Asthma ~ 1991    "come and go"  . Pneumonia 06/2011; 07/2011  . Diabetes mellitus type 2, controlled   . GERD (gastroesophageal reflux disease)   . GI bleeding 12/21/11    ""that's why I'm here"  . Hx of colonoscopy with polypectomy 12/11/11    "took out 7; couldn't get #8, that one was precancerous"  . Depression   . 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 Paralysis gastric   . Kidney stones    Past Surgical History  Procedure Laterality Date  . Cesarean section  1981; 1986  . Esophagogastroduodenoscopy  12/11/2011    Procedure: ESOPHAGOGASTRODUODENOSCOPY (EGD);  Surgeon: Missy Sabins, MD;  Location: [facility] ENDOSCOPY;  Service: Endoscopy;  Laterality: N/A;  . Colonoscopy  12/11/2011    Procedure:  COLONOSCOPY;  Surgeon: Missy Sabins, MD;  Location: [facility];  Service: Endoscopy;  Laterality: N/A;  . Abdominal hysterectomy  2002  . Cholecystectomy  ~ 2002  . Tonsillectomy and adenoidectomy  1988  . Appendectomy  ~ 2002    "w/hysterectomy"  . Colonoscopy  04/26/2012    Procedure: COLONOSCOPY;  Surgeon: Wonda Horner, MD;  Location: [facility] ENDOSCOPY;  Service: Endoscopy;  Laterality: N/A;  apc  . Hernia repair     Social History:  reports that she quit smoking about 17 years ago. Her smoking use included Cigarettes. She has a .72 pack-year smoking history. She has never used smokeless tobacco. She reports that she does not drink alcohol or use illicit drugs.  where does patient live--home, ALF, SNF? and with whom if at home?  Can patient participate in ADLs?  Allergies  Allergen Reactions  . Codeine Hypertension  . Penicillins Anaphylaxis  . Reglan [Metoclopramide] Swelling and Rash    5MG  MAKES THROAT SWELL  . Sulfa Antibiotics Hives    Pt states "some of it, not all of it" give her a reaction; "hives all over"  . Xifaxan [Rifaximin] Hives, Shortness Of Breath and Swelling    SOB 550MG  MAKES THROAT SWELL SOB  . Zocor [Simvastatin] Anaphylaxis and Hives  . Crestor [Rosuvastatin] Other (See Comments)    Muscles ache  . Pravastatin Other (See Comments)    Muscles cramp    Family History  Problem Relation Age of Onset  .  Colon cancer Sister   . Alzheimer's disease Mother   . Heart disease Father     (be sure to complete)  Prior to Admission medications   Medication Sig Start Date End Date Taking? Authorizing Provider  acetaminophen (TYLENOL) 500 MG tablet Take 1,000 mg by mouth daily as needed for fever or headache (headache).    Yes Historical Provider, MD  amLODipine (NORVASC) 5 MG tablet Take 5 mg by mouth every evening.   Yes Historical Provider, MD  calcitonin, salmon, (MIACALCIN/FORTICAL) 200 UNIT/ACT nasal spray Place 1 spray into alternate nostrils daily.   Yes  Historical Provider, MD  calcium carbonate (OS-CAL) 600 MG TABS tablet Take 600 mg by mouth daily.   Yes Historical Provider, MD  Cholecalciferol (VITAMIN D PO) Take 1 tablet by mouth daily.   Yes Historical Provider, MD  Choline Fenofibrate (FENOFIBRIC ACID) 135 MG CPDR Take 135 mg by mouth at bedtime.   Yes Historical Provider, MD  Coenzyme Q10 (CO Q-10) 100 MG CAPS Take 100 mg by mouth at bedtime.   Yes Historical Provider, MD  dicyclomine (BENTYL) 10 MG capsule Take 10 mg by mouth 4 (four) times daily -  before meals and at bedtime.   Yes Historical Provider, MD  fluconazole (DIFLUCAN) 100 MG tablet Take 100 mg by mouth at bedtime.    Yes Historical Provider, MD  gabapentin (NEURONTIN) 300 MG capsule TAKE (2) CAPSULES THREE TIMES DAILY. 9AM, 3PM, 9PM 08/10/14  Yes Richard Blenda Mounts, DPM  glipiZIDE (GLUCOTROL) 10 MG tablet Take 10 mg by mouth daily before breakfast.   Yes Historical Provider, MD  hydrochlorothiazide (HYDRODIURIL) 25 MG tablet Take 25 mg by mouth daily.   Yes Historical Provider, MD  Insulin NPH Human, Isophane, (NOVOLIN N RELION [place]) Inject 44 Units into the skin at bedtime.   Yes Historical Provider, MD  LORazepam (ATIVAN) 0.5 MG tablet Take 0.5 mg by mouth 4 (four) times daily. FOR ANXIETY   Yes Historical Provider, MD  losartan (COZAAR) 100 MG tablet Take 100 mg by mouth daily.   Yes Historical Provider, MD  metFORMIN (GLUCOPHAGE) 1000 MG tablet Take 1,000 mg by mouth 2 (two) times daily with a meal.   Yes Historical Provider, MD  Multiple Vitamin (MULTIVITAMIN WITH MINERALS) TABS tablet Take 1 tablet by mouth daily.   Yes Historical Provider, MD  ondansetron (ZOFRAN) 4 MG tablet Take 4 mg by mouth daily as needed for nausea or vomiting.   Yes Historical Provider, MD  Pancrelipase, Lip-Prot-Amyl, (CREON) 24000 UNITS CPEP Take 1 capsule by mouth 2 (two) times daily.    Yes Historical Provider, MD  pantoprazole (PROTONIX) 40 MG tablet Take 40 mg by mouth 2 (two) times daily.    Yes  Historical Provider, MD  potassium chloride (K-DUR) 10 MEQ tablet Take 10 mEq by mouth every morning.   Yes Historical Provider, MD  Probiotic Product (ALIGN PO) Take 1 capsule by mouth daily.   Yes Historical Provider, MD  venlafaxine (EFFEXOR) 75 MG tablet Take 75 mg by mouth 2 (two) times daily.   Yes Historical Provider, MD  albuterol (PROVENTIL) (2.5 MG/3ML) 0.083% nebulizer solution Take 2.5 mg by nebulization 4 (four) times daily as needed for wheezing.    Historical Provider, MD  fexofenadine-pseudoephedrine (ALLEGRA-D) 60-120 MG per tablet Take 1 tablet by mouth every 12 (twelve) hours. Patient not taking: Reported on 10/01/2014 04/16/14   Lenox Ahr, PA-C  HYDROcodone-acetaminophen (NORCO/VICODIN) 5-325 MG per tablet Take 1 tablet by mouth every 4 (four) hours  as needed. 04/16/14   Lenox Ahr, PA-C  methocarbamol (ROBAXIN) 500 MG tablet Take 1 tablet (500 mg total) by mouth 3 (three) times daily. Patient not taking: Reported on 10/01/2014 04/16/14   Lenox Ahr, PA-C  mometasone-formoterol [facility]) 100-5 MCG/ACT AERO 2 puffs then rinse mouth, twice daily Patient not taking: Reported on 10/01/2014 08/10/13   Deneise Lever, MD  PROAIR HFA 108 (90 BASE) MCG/ACT inhaler USE 2 PUFFS EVERY 6 HOURS AS NEEDED FOR WHEEZING    Deneise Lever, MD  promethazine (PHENERGAN) 25 MG tablet Take 25 mg by mouth every 6 (six) hours as needed for nausea or vomiting.     Historical Provider, MD   Physical Exam: Filed Vitals:   10/01/14 1412 10/01/14 1430 10/01/14 1500 10/01/14 1505  BP:  155/87 144/72   Pulse: 92 90 91   Temp:      TempSrc:      Resp: 17 17 16    SpO2: 93% 91% 89% 94%     General:  Awake, in nad  Eyes: PERRL B  ENT: membranes moist, dentition fair  Neck: trachea midline, neck supple  Cardiovascular: regular, s1, s2  Respiratory: normal resp effort, no wheezing  Abdomen: soft,nondistended  Skin: normal skin turgor, no abnormal skin lesions  seen  Musculoskeletal: perfused,no clubbing  Psychiatric: mood/affect normal// no auditory/visual hallucinations  Neurologic: cn2-12 grossly intact, decreased sensation over L arm and leg, strength intact throughout  Labs on Admission:  Basic Metabolic Panel:  Recent Labs Lab 10/01/14 1332  NA 140  K 3.8  CL 103  CO2 24  GLUCOSE 86  BUN 18  CREATININE 0.57  CALCIUM 9.3   Liver Function Tests:  Recent Labs Lab 10/01/14 1332  AST 53*  ALT 24  ALKPHOS 46  BILITOT 0.3  PROT 7.6  ALBUMIN 4.4   No results for input(s): LIPASE, AMYLASE in the last 168 hours. No results for input(s): AMMONIA in the last 168 hours. CBC:  Recent Labs Lab 10/01/14 1332  WBC 6.7  HGB 13.5  HCT 42.1  MCV 90.1  PLT 180   Cardiac Enzymes:  Recent Labs Lab 10/01/14 1332  TROPONINI <0.03    BNP (last 3 results) No results for input(s): PROBNP in the last 8760 hours. CBG: No results for input(s): GLUCAP in the last 168 hours.  Radiological Exams on Admission: Dg Chest 2 View  10/01/2014   CLINICAL DATA:  Left-sided numbness for 3 months off and on. Mild shortness of breath.  EXAM: CHEST  2 VIEW  COMPARISON:  None.  FINDINGS: The heart size and mediastinal contours are within normal limits. Both lungs are clear. Mild degenerative changes in the spine.  IMPRESSION: No active cardiopulmonary disease.   Electronically Signed   By: Shon Hale M.D.   On: 10/01/2014 14:02   Ct Head Wo Contrast  10/01/2014   CLINICAL DATA:  Left-sided numbness and tingling.  EXAM: CT HEAD WITHOUT CONTRAST  TECHNIQUE: Contiguous axial images were obtained from the base of the skull through the vertex without intravenous contrast.  COMPARISON:  Report from CT head 06/13/2003, images not available  FINDINGS: Small hypodensities within the right and left basal ganglia consistent lacunar infarcts, not reported on remote prior exam, however age indeterminate. No evidence of territorial infarct. The basilar artery  appears ectatic, this was described previously. No intracranial hemorrhage, mass effect, or midline shift. No hydrocephalus. No intracranial fluid collection. Calvarium is intact. Included paranasal sinuses and mastoid air cells are well  aerated.  IMPRESSION: 1. Age-indeterminate lacunar infarcts in the right and left basal ganglia. 2. Ectasia of the basilar artery, described previously.   Electronically Signed   By: Jeb Levering M.D.   On: 10/01/2014 14:17    EKG: Independently reviewed. NSR  Assessment/Plan Principal Problem:   Cerebrovascular accident Active Problems:   HTN (hypertension)   Diabetes mellitus, type 2   CVA (cerebral infarction)  1. L sided numbness 1. CT with findings of age indeterminate B lacunar infarcts 2. Neurology consulted with recs to admit for formal stroke work up 3. Will admit to med-tele 4. Will obtain MRI/MRA head/brain, check 2d echo, carotid dopplers 5. Continue on asa 325mg  for now 2. HTN 1. bp stable 2. Continue home regimen 3. DM 1. Cont on SSI coverage 2. Will cont metformin 4. DVT prophylaxis 1. Heparin subq  Code Status: Full (must indicate code status--if unknown or must be presumed, indicate so) Family Communication: Pt and family in room (indicate person spoken with, if applicable, with phone number if by telephone) Disposition Plan: admit to med tele (indicate anticipated LOS)  Time spent: 5min  [facility], [facility] Pager [phone]  If 7PM-7AM, please contact night-coverage [email or web address] Password [facility] 10/01/2014, 3:58 PM

## 2014-10-01 NOTE — ED Notes (Signed)
Pt still in CT; will reassess neuro checks and vital signs when patient returns.

## 2014-10-01 NOTE — ED Provider Notes (Signed)
CSN: [id number]     Arrival date & time 10/01/14  1236 History   First MD Initiated Contact with Patient 10/01/14 1311     Chief Complaint  Patient presents with  . Numbness     (Consider location/radiation/quality/duration/timing/severity/associated sxs/prior Treatment) The history is provided by the patient.  pt c/o left sided numbness for the past day. States note in church yesterday, left arm and leg felt numb and felt was dragging left leg when walking. States symptoms lasted approximately 15 minutes and resolved.  States symptoms occurred intermittently during day. Last pm when went to bed felt fine.  States awoke around 8 am today, and when tried to walk to kitchen left arm and leg felt numb, and again felt left leg was weak.  Last felt normal, last pm prior to bed. Today symptoms have been persistent/constant. No neck or back pain, no radicular pain. No hx ddd. Denies headache. No fever or chills. Pt denies cp or sob. States otherwise recent health c/w baseline. Pt denies change in speech or vision.      Past Medical History  Diagnosis Date  . Hypertension   . Hypercholesterolemia   . Bronchitis   . Pancreatitis   . Complication of anesthesia 12/11/11    "angry, mean, hateful after" endoscopy & colonoscopy  . Asthma ~ 1991    "come and go"  . Pneumonia 06/2011; 07/2011  . Diabetes mellitus type 2, controlled   . GERD (gastroesophageal reflux disease)   . GI bleeding 12/21/11    ""that's why I'm here"  . Hx of colonoscopy with polypectomy 12/11/11    "took out 7; couldn't get #8, that one was precancerous"  . Depression   . 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 Paralysis gastric   . Kidney stones    Past Surgical History  Procedure Laterality Date  . Cesarean section  1981; 1986  . Esophagogastroduodenoscopy  12/11/2011    Procedure: ESOPHAGOGASTRODUODENOSCOPY (EGD);  Surgeon: Missy Sabins, MD;  Location: [facility] ENDOSCOPY;  Service: Endoscopy;   Laterality: N/A;  . Colonoscopy  12/11/2011    Procedure: COLONOSCOPY;  Surgeon: Missy Sabins, MD;  Location: [place];  Service: Endoscopy;  Laterality: N/A;  . Abdominal hysterectomy  2002  . Cholecystectomy  ~ 2002  . Tonsillectomy and adenoidectomy  1988  . Appendectomy  ~ 2002    "w/hysterectomy"  . Colonoscopy  04/26/2012    Procedure: COLONOSCOPY;  Surgeon: Wonda Horner, MD;  Location: [facility] ENDOSCOPY;  Service: Endoscopy;  Laterality: N/A;  apc  . Hernia repair     Family History  Problem Relation Age of Onset  . Colon cancer Sister   . Alzheimer's disease Mother   . Heart disease Father    History  Substance Use Topics  . Smoking status: Former Smoker -- 0.12 packs/day for 6 years    Types: Cigarettes    Quit date: 12/09/1996  . Smokeless tobacco: Never Used  . Alcohol Use: No   OB History    No data available     Review of Systems  Constitutional: Negative for fever and chills.  HENT: Negative for sore throat.   Eyes: Negative for visual disturbance.  Respiratory: Negative for shortness of breath.   Cardiovascular: Negative for chest pain and leg swelling.  Gastrointestinal: Negative for vomiting, abdominal pain and diarrhea.  Endocrine: Negative for polyuria.  Genitourinary: Negative for dysuria and flank pain.  Musculoskeletal: Negative for  back pain and neck pain.  Skin: Negative for rash.  Neurological: Positive for weakness and numbness. Negative for syncope, speech difficulty and headaches.  Hematological: Does not bruise/bleed easily.  Psychiatric/Behavioral: Negative for confusion.      Allergies  Codeine; Penicillins; Reglan; Sulfa antibiotics; Xifaxan; and Zocor  Home Medications   Prior to Admission medications   Medication Sig Start Date End Date Taking? Authorizing Provider  acetaminophen (TYLENOL) 500 MG tablet Take 1,000 mg by mouth daily as needed for fever or headache.    Historical Provider, MD  albuterol (PROVENTIL) (2.5 MG/3ML)  0.083% nebulizer solution Take 2.5 mg by nebulization 4 (four) times daily as needed for wheezing.    Historical Provider, MD  amLODipine (NORVASC) 5 MG tablet Take 5 mg by mouth every evening.    Historical Provider, MD  Cholecalciferol (VITAMIN D PO) Take 1 tablet by mouth daily.    Historical Provider, MD  dicyclomine (BENTYL) 10 MG capsule Take 10 mg by mouth 4 (four) times daily -  before meals and at bedtime.    Historical Provider, MD  fexofenadine-pseudoephedrine (ALLEGRA-D) 60-120 MG per tablet Take 1 tablet by mouth every 12 (twelve) hours. 04/16/14   Lenox Ahr, PA-C  fluconazole (DIFLUCAN) 100 MG tablet Take 100 mg by mouth at bedtime.     Historical Provider, MD  gabapentin (NEURONTIN) 300 MG capsule TAKE (2) CAPSULES THREE TIMES DAILY. Melrose Nakayama, 9PM 08/10/14   Harriet Masson, DPM  glipiZIDE (GLUCOTROL) 10 MG tablet Take 10 mg by mouth daily before breakfast.    Historical Provider, MD  hydrochlorothiazide (HYDRODIURIL) 25 MG tablet Take 25 mg by mouth daily.    Historical Provider, MD  HYDROcodone-acetaminophen (NORCO/VICODIN) 5-325 MG per tablet Take 1 tablet by mouth every 4 (four) hours as needed. 04/16/14   Lenox Ahr, PA-C  insulin detemir (LEVEMIR) 100 UNIT/ML injection Inject 65 Units into the skin at bedtime.     Historical Provider, MD  LORazepam (ATIVAN) 0.5 MG tablet Take 0.5 mg by mouth 3 (three) times daily. FOR ANXIETY    Historical Provider, MD  methocarbamol (ROBAXIN) 500 MG tablet Take 1 tablet (500 mg total) by mouth 3 (three) times daily. 04/16/14   Lenox Ahr, PA-C  mometasone-formoterol [facility]) 100-5 MCG/ACT AERO 2 puffs then rinse mouth, twice daily 08/10/13   Deneise Lever, MD  Multiple Vitamin (MULTIVITAMIN WITH MINERALS) TABS tablet Take 1 tablet by mouth daily.    Historical Provider, MD  Olopatadine HCl (PATADAY) 0.2 % SOLN Place 1 drop into both eyes daily.    Historical Provider, MD  ondansetron (ZOFRAN) 4 MG tablet Take 4 mg by mouth daily as  needed for nausea or vomiting.    Historical Provider, MD  Pancrelipase, Lip-Prot-Amyl, (CREON) 24000 UNITS CPEP Take 2 capsules by mouth 4 (four) times daily.     Historical Provider, MD  pantoprazole (PROTONIX) 40 MG tablet Take 40 mg by mouth 2 (two) times daily.     Historical Provider, MD  potassium chloride (K-DUR) 10 MEQ tablet Take 10 mEq by mouth every morning.    Historical Provider, MD  PROAIR HFA 108 (90 BASE) MCG/ACT inhaler USE 2 PUFFS EVERY 6 HOURS AS NEEDED FOR WHEEZING    Clinton D Young, MD  Probiotic Product (ALIGN PO) Take 1 capsule by mouth daily.    Historical Provider, MD  promethazine (PHENERGAN) 25 MG tablet Take 25 mg by mouth every 6 (six) hours as needed for nausea or vomiting.  Historical Provider, MD  rosuvastatin (CRESTOR) 10 MG tablet Take 10 mg by mouth daily.    Historical Provider, MD  venlafaxine (EFFEXOR) 75 MG tablet Take 75 mg by mouth 2 (two) times daily.    Historical Provider, MD   BP 139/69 mmHg  Pulse 95  Temp(Src) 97.9 F (36.6 C) (Oral)  Resp 16  SpO2 90% Physical Exam  Constitutional: She is oriented to person, place, and time. She appears well-developed and well-nourished. No distress.  HENT:  Head: Atraumatic.  No temporal tenderness.   Eyes: Conjunctivae and EOM are normal. Pupils are equal, round, and reactive to light. No scleral icterus.  Neck: Normal range of motion. Neck supple. No tracheal deviation present.  No bruit  Cardiovascular: Normal rate, regular rhythm, normal heart sounds and intact distal pulses.   Pulmonary/Chest: Effort normal and breath sounds normal. No respiratory distress.  Abdominal: Soft. Normal appearance and bowel sounds are normal. She exhibits no distension. There is no tenderness.  Musculoskeletal: She exhibits no edema or tenderness.  c spine non tender.   Neurological: She is alert and oriented to person, place, and time. No cranial nerve deficit.  No pronator drift. Finger to nose wnl bil. Pt w weak  grip on left 4+/5. Pt reports altered sensation LUE/LLE, although can feel pressure/light touch.   Skin: Skin is warm and dry. No rash noted. She is not diaphoretic.  Psychiatric: She has a normal mood and affect.  Nursing note and vitals reviewed.   ED Course  Procedures (including critical care time) Labs Review  Results for orders placed or performed during the hospital encounter of 10/01/14  Urinalysis, Routine w reflex microscopic  Result Value Ref Range   Color, Urine YELLOW YELLOW   APPearance CLEAR CLEAR   Specific Gravity, Urine 1.010 1.005 - 1.030   pH 6.5 5.0 - 8.0   Glucose, UA NEGATIVE NEGATIVE mg/dL   Hgb urine dipstick NEGATIVE NEGATIVE   Bilirubin Urine NEGATIVE NEGATIVE   Ketones, ur NEGATIVE NEGATIVE mg/dL   Protein, ur NEGATIVE NEGATIVE mg/dL   Urobilinogen, UA 0.2 0.0 - 1.0 mg/dL   Nitrite NEGATIVE NEGATIVE   Leukocytes, UA NEGATIVE NEGATIVE  CBC  Result Value Ref Range   WBC 6.7 4.0 - 10.5 K/uL   RBC 4.67 3.87 - 5.11 MIL/uL   Hemoglobin 13.5 12.0 - 15.0 g/dL   HCT 42.1 36.0 - 46.0 %   MCV 90.1 78.0 - 100.0 fL   MCH 28.9 26.0 - 34.0 pg   MCHC 32.1 30.0 - 36.0 g/dL   RDW 14.4 11.5 - 15.5 %   Platelets 180 150 - 400 K/uL   Dg Chest 2 View  10/01/2014   CLINICAL DATA:  Left-sided numbness for 3 months off and on. Mild shortness of breath.  EXAM: CHEST  2 VIEW  COMPARISON:  None.  FINDINGS: The heart size and mediastinal contours are within normal limits. Both lungs are clear. Mild degenerative changes in the spine.  IMPRESSION: No active cardiopulmonary disease.   Electronically Signed   By: Shon Hale M.D.   On: 10/01/2014 14:02   Ct Head Wo Contrast  10/01/2014   CLINICAL DATA:  Left-sided numbness and tingling.  EXAM: CT HEAD WITHOUT CONTRAST  TECHNIQUE: Contiguous axial images were obtained from the base of the skull through the vertex without intravenous contrast.  COMPARISON:  Report from CT head 06/13/2003, images not available  FINDINGS: Small  hypodensities within the right and left basal ganglia consistent lacunar infarcts, not reported  on remote prior exam, however age indeterminate. No evidence of territorial infarct. The basilar artery appears ectatic, this was described previously. No intracranial hemorrhage, mass effect, or midline shift. No hydrocephalus. No intracranial fluid collection. Calvarium is intact. Included paranasal sinuses and mastoid air cells are well aerated.  IMPRESSION: 1. Age-indeterminate lacunar infarcts in the right and left basal ganglia. 2. Ectasia of the basilar artery, described previously.   Electronically Signed   By: Jeb Levering M.D.   On: 10/01/2014 14:17        EKG Interpretation   Date/Time:  Monday October 01 2014 12:59:02 EST Ventricular Rate:  92 PR Interval:  154 QRS Duration: 92 QT Interval:  386 QTC Calculation: 477 R Axis:   32 Text Interpretation:  Sinus rhythm No significant change since last  tracing Confirmed by Ashok Cordia  MD, Lennette Bihari ([id number]) on 10/01/2014 1:25:56 PM      MDM   Iv ns. Labs. Ct.  Reviewed nursing notes and prior charts for additional history.   Ct results noted, discussed w pt.    Paged neurology, discussed w Dr Aram Beecham - he indicates given symptoms, exam findings, and duration of symptoms, recommends admit at [facility] for completion cva workup to medical service.  [facility] paged for admission.  Recheck pt, no change in exam from prior.      Mirna Mires, MD 10/01/14 1500

## 2014-10-01 NOTE — ED Notes (Signed)
Per pt, noted left side numbness and tingling to left arm and leg starting at 8 am .  Has had in past but only lasted 15 min.  Noticeable weakness to left arm and leg.  No change in speech.

## 2014-10-01 NOTE — ED Notes (Signed)
Patient transported to CT 

## 2014-10-02 DIAGNOSIS — E1129 Type 2 diabetes mellitus with other diabetic kidney complication: Secondary | ICD-10-CM

## 2014-10-02 DIAGNOSIS — R208 Other disturbances of skin sensation: Secondary | ICD-10-CM

## 2014-10-02 DIAGNOSIS — R209 Unspecified disturbances of skin sensation: Secondary | ICD-10-CM

## 2014-10-02 DIAGNOSIS — I369 Nonrheumatic tricuspid valve disorder, unspecified: Secondary | ICD-10-CM

## 2014-10-02 DIAGNOSIS — I639 Cerebral infarction, unspecified: Secondary | ICD-10-CM

## 2014-10-02 LAB — HEMOGLOBIN A1C
Hgb A1c MFr Bld: 6.6 % — ABNORMAL HIGH (ref <5.7)
MEAN PLASMA GLUCOSE: 143 mg/dL — AB (ref <117)

## 2014-10-02 LAB — CBC
HCT: 42.8 % (ref 36.0–46.0)
HEMOGLOBIN: 13.5 g/dL (ref 12.0–15.0)
MCH: 28.2 pg (ref 26.0–34.0)
MCHC: 31.5 g/dL (ref 30.0–36.0)
MCV: 89.5 fL (ref 78.0–100.0)
Platelets: 187 10*3/uL (ref 150–400)
RBC: 4.78 MIL/uL (ref 3.87–5.11)
RDW: 14.1 % (ref 11.5–15.5)
WBC: 6 10*3/uL (ref 4.0–10.5)

## 2014-10-02 LAB — COMPREHENSIVE METABOLIC PANEL
ALK PHOS: 39 U/L (ref 39–117)
ALT: 24 U/L (ref 0–35)
ANION GAP: 9 (ref 5–15)
AST: 48 U/L — ABNORMAL HIGH (ref 0–37)
Albumin: 4.3 g/dL (ref 3.5–5.2)
BILIRUBIN TOTAL: 0.5 mg/dL (ref 0.3–1.2)
BUN: 18 mg/dL (ref 6–23)
CHLORIDE: 101 meq/L (ref 96–112)
CO2: 32 mmol/L (ref 19–32)
Calcium: 9.1 mg/dL (ref 8.4–10.5)
Creatinine, Ser: 0.57 mg/dL (ref 0.50–1.10)
GFR calc Af Amer: >90 mL/min (ref >90)
GFR calc non Af Amer: >90 mL/min (ref >90)
GLUCOSE: 106 mg/dL — AB (ref 70–99)
POTASSIUM: 3 mmol/L — AB (ref 3.5–5.1)
SODIUM: 142 mmol/L (ref 135–145)
TOTAL PROTEIN: 7.9 g/dL (ref 6.0–8.3)

## 2014-10-02 LAB — GLUCOSE, CAPILLARY
GLUCOSE-CAPILLARY: 161 mg/dL — AB (ref 70–99)
Glucose-Capillary: 94 mg/dL (ref 70–99)
Glucose-Capillary: 76 mg/dL (ref 70–99)
Glucose-Capillary: 135 mg/dL — ABNORMAL HIGH (ref 70–99)
Glucose-Capillary: 154 mg/dL — ABNORMAL HIGH (ref 70–99)

## 2014-10-02 LAB — LIPID PANEL
Cholesterol: 259 mg/dL — ABNORMAL HIGH (ref 0–200)
HDL: 33 mg/dL — AB (ref >39)
LDL CALC: UNDETERMINED mg/dL (ref 0–99)
Total CHOL/HDL Ratio: 7.8 RATIO
Triglycerides: 523 mg/dL — ABNORMAL HIGH (ref <150)
VLDL: UNDETERMINED mg/dL (ref 0–40)

## 2014-10-02 MED ORDER — LORAZEPAM 0.5 MG PO TABS
0.5000 mg | ORAL_TABLET | Freq: Four times a day (QID) | ORAL | Status: DC
  Administered 2014-10-02 – 2014-10-03 (×4): 0.5 mg via ORAL
  Filled 2014-10-02 (×4): qty 1

## 2014-10-02 MED ORDER — NIACIN ER 250 MG PO CPCR
250.0000 mg | ORAL_CAPSULE | Freq: Every day | ORAL | Status: DC
  Administered 2014-10-02: 250 mg via ORAL
  Filled 2014-10-02: qty 1

## 2014-10-02 MED ORDER — POTASSIUM CHLORIDE 10 MEQ/100ML IV SOLN
10.0000 meq | INTRAVENOUS | Status: AC
  Administered 2014-10-02 (×4): 10 meq via INTRAVENOUS
  Filled 2014-10-02 (×4): qty 100

## 2014-10-02 MED ORDER — ATORVASTATIN CALCIUM 20 MG PO TABS
20.0000 mg | ORAL_TABLET | Freq: Every day | ORAL | Status: DC
Start: 2014-10-02 — End: 2014-10-03
  Filled 2014-10-02 (×3): qty 1

## 2014-10-02 MED ORDER — POTASSIUM CHLORIDE CRYS ER 20 MEQ PO TBCR
40.0000 meq | EXTENDED_RELEASE_TABLET | Freq: Once | ORAL | Status: AC
  Administered 2014-10-02: 40 meq via ORAL
  Filled 2014-10-02: qty 2

## 2014-10-02 NOTE — Progress Notes (Signed)
UR completed 

## 2014-10-02 NOTE — Progress Notes (Signed)
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 -Continue aspirin -Carotid duplex -Echocardiogram -Hemoglobin A1c--pending -Total cholesterol 259, LDL unable to be calculated due to high triglycerides -Consult neurology Hyperlipidemia -Restart fenofibrate -Add niacin at bedtime -patient states that she cannot tolerate Crestor due to muscle aches -start atorvastatin -07/16/2014 LDL 72--patient endorses compliance with medications Hypertension -Continue amlodipine, HCTZ, losartan Diabetes mellitus type 2 -Continue home dose NPH -NovoLog sliding scale -Patient also has history of diabetic gastroparesis -Hemoglobin A1c 07/16/2014--7.5 Anxiety/depression -Continue Effexor -Restart home dose of Ativan Morbid obesity -BMI 36.1   Family Communication:   Sister updated at beside Disposition Plan:   Home when medically stable        Procedures/Studies: Dg Chest 2 View  10/01/2014   CLINICAL DATA:  Left-sided numbness for 3 months off and on. Mild shortness of breath.  EXAM: CHEST  2 VIEW  COMPARISON:  None.  FINDINGS: The heart size and mediastinal contours are within normal limits. Both lungs are clear. Mild degenerative changes in the spine.  IMPRESSION: No active cardiopulmonary disease.   Electronically Signed   By: Shon Hale M.D.   On: 10/01/2014 14:02   Ct Head Wo Contrast  10/01/2014   CLINICAL DATA:  Left-sided numbness and tingling.  EXAM: CT HEAD WITHOUT CONTRAST  TECHNIQUE: Contiguous axial images were obtained from the base of the skull through the vertex without intravenous contrast.  COMPARISON:  Report from CT head 06/13/2003, images not available  FINDINGS: Small hypodensities within the  right and left basal ganglia consistent lacunar infarcts, not reported on remote prior exam, however age indeterminate. No evidence of territorial infarct. The basilar artery appears ectatic, this was described previously. No intracranial hemorrhage, mass effect, or midline shift. No hydrocephalus. No intracranial fluid collection. Calvarium is intact. Included paranasal sinuses and mastoid air cells are well aerated.  IMPRESSION: 1. Age-indeterminate lacunar infarcts in the right and left basal ganglia. 2. Ectasia of the basilar artery, described previously.   Electronically Signed   By: Jeb Levering M.D.   On: 10/01/2014 14:17   Mr [name] Head Wo Contrast  10/01/2014   CLINICAL DATA:  Stroke.  Left-sided numbness and tingling  EXAM: MRI HEAD WITHOUT CONTRAST  MRA HEAD WITHOUT CONTRAST  TECHNIQUE: Multiplanar, multiecho pulse sequences of the brain and surrounding structures were obtained without intravenous contrast. Angiographic images of the head were obtained using MRA technique without contrast.  COMPARISON:  CT head 10/01/2014  FINDINGS: MRI HEAD FINDINGS  Negative for acute infarct. Moderate chronic microvascular ischemic changes in the cerebral white matter bilaterally. 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 Chronic ischemia in the pons.  Scattered areas of chronic micro hemorrhage in the brain bilaterally. Approximately 10 such areas of micro hemorrhage are present.  Ventricle size is normal. Cerebral volume normal for age. Pituitary normal in size.  Negative for mass or edema.  No shift of the midline structures.  MRA HEAD FINDINGS  Dominant left vertebral artery which is widely patent. Right vertebral artery also patent to the basilar. The basilar is tortuous and enlarged compatible with atherosclerotic ectasia. Left PICA patent. Right PICA not visualized. Right AICA is visualized. Superior cerebellar and posterior cerebral arteries are patent bilaterally.   Internal  carotid artery patent bilaterally without stenosis. Anterior and middle cerebral arteries widely patent.  Negative for cerebral aneurysm.  IMPRESSION: Negative for acute infarct.  Moderately severe chronic microvascular ischemic changes. Moderately severe chronic micro hemorrhage in the brain. 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 No focal intracranial stenosis.   Electronically Signed   By: Franchot Gallo M.D.   On: 10/01/2014 18:44   Mr Brain Wo Contrast  10/01/2014   CLINICAL DATA:  Stroke.  Left-sided numbness and tingling  EXAM: MRI HEAD WITHOUT CONTRAST  MRA HEAD WITHOUT CONTRAST  TECHNIQUE: Multiplanar, multiecho pulse sequences of the brain and surrounding structures were obtained without intravenous contrast. Angiographic images of the head were obtained using MRA technique without contrast.  COMPARISON:  CT head 10/01/2014  FINDINGS: MRI HEAD FINDINGS  Negative for acute infarct. Moderate chronic microvascular ischemic changes in the cerebral white matter bilaterally. 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 Chronic ischemia in the pons.  Scattered areas of chronic micro hemorrhage in the brain bilaterally. Approximately 10 such areas of micro hemorrhage are present.  Ventricle size is normal. Cerebral volume normal for age. Pituitary normal in size.  Negative for mass or edema.  No shift of the midline structures.  MRA HEAD FINDINGS  Dominant left vertebral artery which is widely patent. Right vertebral artery also patent to the basilar. The basilar is tortuous and enlarged compatible with atherosclerotic ectasia. Left PICA patent. Right PICA not visualized. Right AICA is visualized. Superior cerebellar and posterior cerebral arteries are patent bilaterally.  Internal carotid artery patent bilaterally without stenosis. Anterior and middle cerebral arteries widely patent.  Negative for  cerebral aneurysm.  IMPRESSION: Negative for acute infarct.  Moderately severe chronic microvascular ischemic changes. Moderately severe chronic micro hemorrhage in the brain. 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 No focal intracranial stenosis.   Electronically Signed   By: Franchot Gallo M.D.   On: 10/01/2014 18:44         Subjective: Patient states that she still has some numbness and tingling in her left upper extremity and left lower extremity but is improved from yesterday. She denies any headache, chest pain, shortness breath, vomiting, diarrhea, abdominal pain   Objective: Filed Vitals:   10/01/14 1638 10/01/14 1855 10/01/14 2044 10/02/14 0544  BP: 158/87 142/87 143/83 160/79  Pulse: 83  81 87  Temp: 98.3 F (36.8 C)  98.2 F (36.8 C) 97.8 F (36.6 C)  TempSrc: Oral  Oral Oral  Resp: 16  16 16   Height: 5\' 2"  (1.575 m)     Weight: 89.4 kg (197 lb 1.5 oz)     SpO2: 94%  94% 96%    Intake/Output Summary (Last 24 hours) at 10/02/14 1003 Last data filed at 10/02/14 0650  Gross per 24 hour  Intake    240 ml  Output   2300 ml  Net  -2060 ml   Weight change:  Exam:   General:  Pt is alert, follows commands appropriately, not in acute distress  HEENT: No icterus, No thrush,[place]/AT  Cardiovascular: RRR, S1/S2, no rubs, no gallops  Respiratory: CTA bilaterally, no wheezing, no crackles, no rhonchi  Abdomen: Soft/+BS, non tender, non distended, no guarding  Extremities: No edema, No lymphangitis, No petechiae, No rashes, no synovitis  Data Reviewed: Basic Metabolic Panel:  Recent Labs Lab 10/01/14 1332 10/02/14 0415  NA 140 142  K  3.8 3.0*  CL 103 101  CO2 24 32  GLUCOSE 86 106*  BUN 18 18  CREATININE 0.57 0.57  CALCIUM 9.3 9.1   Liver Function Tests:  Recent Labs Lab 10/01/14 1332 10/02/14 0415  AST 53* 48*  ALT 24 24  ALKPHOS 46 39  BILITOT 0.3 0.5  PROT 7.6 7.9  ALBUMIN 4.4  4.3   No results for input(s): LIPASE, AMYLASE in the last 168 hours. No results for input(s): AMMONIA in the last 168 hours. CBC:  Recent Labs Lab 10/01/14 1332 10/02/14 0415  WBC 6.7 6.0  HGB 13.5 13.5  HCT 42.1 42.8  MCV 90.1 89.5  PLT 180 187   Cardiac Enzymes:  Recent Labs Lab 10/01/14 1332  TROPONINI <0.03   BNP: Invalid input(s): POCBNP CBG:  Recent Labs Lab 10/01/14 1852 10/01/14 2129 10/02/14 0721  GLUCAP 116* 104* 94    No results found for this or any previous visit (from the past 240 hour(s)).   Scheduled Meds: . amLODipine  5 mg Oral QPM  . aspirin  325 mg Oral Daily  . feeding supplement (ENSURE COMPLETE)  237 mL Oral BID BM  . gabapentin  300 mg Oral BID  . glipiZIDE  10 mg Oral QAC breakfast  . heparin  5,000 Units Subcutaneous 3 times per day  . hydrochlorothiazide  25 mg Oral Daily  . insulin aspart  0-15 Units Subcutaneous TID WC  . insulin aspart  0-5 Units Subcutaneous QHS  . insulin NPH Human  44 Units Subcutaneous QHS  . lipase/protease/amylase  24,000 Units Oral BID PC  . LORazepam  0.5 mg Oral Q6H  . losartan  100 mg Oral Daily  . pantoprazole  40 mg Oral BID  . potassium chloride  10 mEq Intravenous Q1 Hr x 4  . potassium chloride  40 mEq Oral Once  . sodium chloride  3 mL Intravenous Q12H  . venlafaxine  75 mg Oral BID   Continuous Infusions:    Sanaiya Welliver, DO  [facility] Pager [phone]  If 7PM-7AM, please contact night-coverage [email or web address] Password [email or web address] 10/02/2014, 10:03 AM   LOS: 1 day

## 2014-10-02 NOTE — Progress Notes (Signed)
Nutrition Brief Note  Patient identified on the Malnutrition Screening Tool (MST) Report  Wt Readings from Last 15 Encounters:  10/01/14 197 lb 1.5 oz (89.4 kg)  10/01/14 197 lb (89.359 kg)  04/16/14 212 lb (96.163 kg)  03/02/14 214 lb (97.07 kg)  09/20/13 198 lb (89.812 kg)  08/10/13 209 lb 6.4 oz (94.983 kg)  07/26/13 198 lb (89.812 kg)  05/02/13 205 lb 6.4 oz (93.169 kg)  04/05/13 208 lb (94.348 kg)  12/12/12 205 lb 9.6 oz (93.26 kg)  11/11/12 199 lb 6.4 oz (90.447 kg)  07/07/12 177 lb (80.287 kg)  06/07/12 181 lb (82.101 kg)  04/26/12 188 lb (85.276 kg)  12/21/11 188 lb 4.4 oz (85.4 kg)    Body mass index is 36.04 kg/(m^2). Patient meets criteria for Obesity II based on current BMI.   Current diet order is Heart Healthy/Carb Mod, patient is consuming approximately 100% of meals at this time. Labs and medications reviewed.   Previous medical records indicate pt with ~15 lb weight loss over 5 month period (7% body weight loss, non-significant for time frame). Is currently at 100% usual body weight (UBW 200 lb per previous RD assessment). RN documentation indicates pt with excellent appetite.  No nutrition interventions warranted at this time. If nutrition issues arise, please consult RD.   Atlee Abide MS RD LDN Clinical Dietitian [phone]

## 2014-10-02 NOTE — Progress Notes (Signed)
VASCULAR LAB PRELIMINARY  PRELIMINARY  PRELIMINARY  PRELIMINARY  Carotid duplex completed.    Preliminary report:  Bilateral:  1-39% ICA stenosis.  Vertebral artery flow is antegrade.     Nanami Whitelaw, RVS 10/02/2014, 12:01 PM

## 2014-10-02 NOTE — Progress Notes (Signed)
Echocardiogram 2D Echocardiogram has been performed.  Destiny Robinson 10/02/2014, 10:44 AM

## 2014-10-02 NOTE — Consult Note (Signed)
Referring Physician: Tat    Chief Complaint: Left sided numbness  HPI: Destiny Robinson is an 58 y.o. female who reports that she has had episodes of numbness on the left side for the past 3 months.  She reports that they involve the left face, arm and leg.  They usually last about thirty minutes and resolve spontaneously.  Her last prior to this event was on 12/27.  On the day of admission the patient awakened normal.  At about 0800 she developed numbness on her left including her arm and leg.  She can not recall if her face was involved.  When her symptoms did not spontaneously resolve as they had in the past, she presented for evaluation. Symptoms have improved somewhat since admission.      Date last known well: Date: 10/01/2014 Time last known well: Time: 08:00 tPA Given: No: Outside time window  Past Medical History  Diagnosis Date  . Hypertension   . Hypercholesterolemia   . Bronchitis   . Pancreatitis   . Complication of anesthesia 12/11/11    "angry, mean, hateful after" endoscopy & colonoscopy  . Asthma ~ 1991    "come and go"  . Pneumonia 06/2011; 07/2011  . Diabetes mellitus type 2, controlled   . GERD (gastroesophageal reflux disease)   . GI bleeding 12/21/11    ""that's why I'm here"  . Hx of colonoscopy with polypectomy 12/11/11    "took out 7; couldn't get #8, that one was precancerous"  . Depression   . 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 Paralysis gastric   . Kidney stones     Past Surgical History  Procedure Laterality Date  . Cesarean section  1981; 1986  . Esophagogastroduodenoscopy  12/11/2011    Procedure: ESOPHAGOGASTRODUODENOSCOPY (EGD);  Surgeon: Missy Sabins, MD;  Location: [facility] ENDOSCOPY;  Service: Endoscopy;  Laterality: N/A;  . Colonoscopy  12/11/2011    Procedure: COLONOSCOPY;  Surgeon: Missy Sabins, MD;  Location: [place];  Service: Endoscopy;  Laterality: N/A;  . Abdominal hysterectomy  2002  . Cholecystectomy  ~ 2002   . Tonsillectomy and adenoidectomy  1988  . Appendectomy  ~ 2002    "w/hysterectomy"  . Colonoscopy  04/26/2012    Procedure: COLONOSCOPY;  Surgeon: Wonda Horner, MD;  Location: [facility] ENDOSCOPY;  Service: Endoscopy;  Laterality: N/A;  apc  . Hernia repair      Family History  Problem Relation Age of Onset  . Colon cancer Sister   . Alzheimer's disease Mother   . Heart disease Father    Social History:  reports that she quit smoking about 17 years ago. Her smoking use included Cigarettes. She has a .72 pack-year smoking history. She has never used smokeless tobacco. She reports that she does not drink alcohol or use illicit drugs.  Allergies:  Allergies  Allergen Reactions  . Codeine Hypertension  . Penicillins Anaphylaxis  . Reglan [Metoclopramide] Swelling and Rash    5MG  MAKES THROAT SWELL  . Sulfa Antibiotics Hives    Pt states "some of it, not all of it" give her a reaction; "hives all over"  . Xifaxan [Rifaximin] Hives, Shortness Of Breath and Swelling    SOB 550MG  MAKES THROAT SWELL SOB  . Zocor [Simvastatin] Anaphylaxis and Hives  . Crestor [Rosuvastatin] Other (See Comments)    Muscles ache  . Pravastatin Other (See Comments)    Muscles cramp    Medications:  I have reviewed the patient's current medications. Prior to Admission:  Facility-administered medications prior to admission  Medication Dose Route Frequency Provider Last Rate Last Dose  . triamcinolone acetonide (KENALOG) 10 MG/ML injection 10 mg  10 mg Intramuscular Once Harriet Masson, DPM       Prescriptions prior to admission  Medication Sig Dispense Refill Last Dose  . acetaminophen (TYLENOL) 500 MG tablet Take 1,000 mg by mouth daily as needed for fever or headache (headache).    09/30/2014 at Unknown time  . amLODipine (NORVASC) 5 MG tablet Take 5 mg by mouth every evening.   09/30/2014 at Unknown time  . calcitonin, salmon, (MIACALCIN/FORTICAL) 200 UNIT/ACT nasal spray Place 1 spray into alternate  nostrils daily.   10/01/2014 at Unknown time  . calcium carbonate (OS-CAL) 600 MG TABS tablet Take 600 mg by mouth daily.   09/30/2014 at Unknown time  . Cholecalciferol (VITAMIN D PO) Take 1 tablet by mouth daily.   09/30/2014 at Unknown time  . Choline Fenofibrate (FENOFIBRIC ACID) 135 MG CPDR Take 135 mg by mouth at bedtime.   09/30/2014 at Unknown time  . Coenzyme Q10 (CO Q-10) 100 MG CAPS Take 100 mg by mouth at bedtime.   09/30/2014 at Unknown time  . dicyclomine (BENTYL) 10 MG capsule Take 10 mg by mouth 4 (four) times daily -  before meals and at bedtime.   10/01/2014 at Unknown time  . fluconazole (DIFLUCAN) 100 MG tablet Take 100 mg by mouth at bedtime.    09/30/2014 at Unknown time  . gabapentin (NEURONTIN) 300 MG capsule TAKE (2) CAPSULES THREE TIMES DAILY. 9AM, 3PM, 9PM 180 capsule 2 10/01/2014 at Unknown time  . glipiZIDE (GLUCOTROL) 10 MG tablet Take 10 mg by mouth daily before breakfast.   09/30/2014 at Unknown time  . hydrochlorothiazide (HYDRODIURIL) 25 MG tablet Take 25 mg by mouth daily.   10/01/2014 at Unknown time  . Insulin NPH Human, Isophane, (NOVOLIN N RELION [place]) Inject 44 Units into the skin at bedtime.   09/30/2014 at Unknown time  . LORazepam (ATIVAN) 0.5 MG tablet Take 0.5 mg by mouth 4 (four) times daily. FOR ANXIETY   10/01/2014 at Unknown time  . losartan (COZAAR) 100 MG tablet Take 100 mg by mouth daily.   10/01/2014 at Unknown time  . metFORMIN (GLUCOPHAGE) 1000 MG tablet Take 1,000 mg by mouth 2 (two) times daily with a meal.   10/01/2014 at Unknown time  . Multiple Vitamin (MULTIVITAMIN WITH MINERALS) TABS tablet Take 1 tablet by mouth daily.   09/30/2014 at Unknown time  . ondansetron (ZOFRAN) 4 MG tablet Take 4 mg by mouth daily as needed for nausea or vomiting.   10/01/2014 at Unknown time  . Pancrelipase, Lip-Prot-Amyl, (CREON) 24000 UNITS CPEP Take 1 capsule by mouth 2 (two) times daily.    10/01/2014 at Unknown time  . pantoprazole (PROTONIX) 40 MG tablet  Take 40 mg by mouth 2 (two) times daily.    10/01/2014 at Unknown time  . potassium chloride (K-DUR) 10 MEQ tablet Take 10 mEq by mouth every morning.   10/01/2014 at Unknown time  . Probiotic Product (ALIGN PO) Take 1 capsule by mouth daily.   09/30/2014 at Unknown time  . venlafaxine (EFFEXOR) 75 MG tablet Take 75 mg by mouth 2 (two) times daily.   09/30/2014 at Unknown time  . albuterol (PROVENTIL) (2.5 MG/3ML) 0.083% nebulizer solution Take 2.5 mg by nebulization 4 (four) times daily as needed for wheezing.   unknown at  unknown time  . fexofenadine-pseudoephedrine (ALLEGRA-D) 60-120 MG per tablet Take 1 tablet by mouth every 12 (twelve) hours. (Patient not taking: Reported on 10/01/2014) 30 tablet 0 Completed Course at Unknown time  . HYDROcodone-acetaminophen (NORCO/VICODIN) 5-325 MG per tablet Take 1 tablet by mouth every 4 (four) hours as needed. 15 tablet 0 unknown at unknown time  . methocarbamol (ROBAXIN) 500 MG tablet Take 1 tablet (500 mg total) by mouth 3 (three) times daily. (Patient not taking: Reported on 10/01/2014) 21 tablet 0 Completed Course at Unknown time  . mometasone-formoterol (DULERA) 100-5 MCG/ACT AERO 2 puffs then rinse mouth, twice daily (Patient not taking: Reported on 10/01/2014) 1 Inhaler prn Completed Course at Unknown time  . PROAIR HFA 108 (90 BASE) MCG/ACT inhaler USE 2 PUFFS EVERY 6 HOURS AS NEEDED FOR WHEEZING 8.5 g 4 unknown at unknown time  . promethazine (PHENERGAN) 25 MG tablet Take 25 mg by mouth every 6 (six) hours as needed for nausea or vomiting.    unknown at unknown time   Scheduled: . amLODipine  5 mg Oral QPM  . aspirin  325 mg Oral Daily  . atorvastatin  20 mg Oral q1800  . feeding supplement (ENSURE COMPLETE)  237 mL Oral BID BM  . gabapentin  300 mg Oral BID  . glipiZIDE  10 mg Oral QAC breakfast  . heparin  5,000 Units Subcutaneous 3 times per day  . hydrochlorothiazide  25 mg Oral Daily  . insulin aspart  0-15 Units Subcutaneous TID WC  .  insulin aspart  0-5 Units Subcutaneous QHS  . insulin NPH Human  44 Units Subcutaneous QHS  . lipase/protease/amylase  24,000 Units Oral BID PC  . LORazepam  0.5 mg Oral Q6H  . losartan  100 mg Oral Daily  . niacin  250 mg Oral QHS  . pantoprazole  40 mg Oral BID  . potassium chloride  10 mEq Intravenous Q1 Hr x 4  . potassium chloride  40 mEq Oral Once  . sodium chloride  3 mL Intravenous Q12H  . venlafaxine  75 mg Oral BID    ROS: History obtained from the patient  General ROS: negative for - chills, fatigue, fever, night sweats, weight gain or weight loss Psychological ROS: negative for - behavioral disorder, hallucinations, memory difficulties, mood swings or suicidal ideation Ophthalmic ROS: blurry vision ENT ROS: negative for - epistaxis, nasal discharge, oral lesions, sore throat, tinnitus or vertigo Allergy and Immunology ROS: negative for - hives or itchy/watery eyes Hematological and Lymphatic ROS: negative for - bleeding problems, bruising or swollen lymph nodes Endocrine ROS: negative for - galactorrhea, hair pattern changes, polydipsia/polyuria or temperature intolerance Respiratory ROS: negative for - cough, hemoptysis, shortness of breath or wheezing Cardiovascular ROS: negative for - chest pain, dyspnea on exertion, edema or irregular heartbeat Gastrointestinal ROS: GI bleeding Genito-Urinary ROS: negative for - dysuria, hematuria, incontinence or urinary frequency/urgency Musculoskeletal ROS: negative for - joint swelling or muscular weakness Neurological ROS: as noted in HPI Dermatological ROS: negative for rash and skin lesion changes  Physical Examination: Blood pressure 160/79, pulse 87, temperature 97.8 F (36.6 C), temperature source Oral, resp. rate 16, height 5\' 2"  (1.575 m), weight 89.4 kg (197 lb 1.5 oz), SpO2 96 %.  HEENT-  Normocephalic, no lesions, without obvious abnormality.  Normal external eye and conjunctiva.  Normal TM's bilaterally.  Normal  auditory canals and external ears. Normal external nose, mucus membranes and septum.  Normal pharynx. Cardiovascular- S1, S2 normal, pulses palpable throughout  Lungs- chest clear, no wheezing, rales, normal symmetric air entry Abdomen- soft, non-tender; bowel sounds normal; no masses,  no organomegaly Extremities- no edema Lymph-no adenopathy palpable Musculoskeletal-no joint tenderness, deformity or swelling Skin-warm and dry, no hyperpigmentation, vitiligo, or suspicious lesions  Neurological Examination Mental Status: Alert, oriented, thought content appropriate.  Speech fluent without evidence of aphasia.  Able to follow 3 step commands without difficulty. Cranial Nerves: II: Discs flat bilaterally; Decreased right peripheral vision, pupils equal, round, reactive to light and accommodation III,IV, VI: ptosis not present, extra-ocular motions intact bilaterally V,VII: smile symmetric, facial light touch sensation normal bilaterally VIII: hearing normal bilaterally IX,X: gag reflex present XI: bilateral shoulder shrug XII: midline tongue extension Motor: Right : Upper extremity   5/5    Left:     Upper extremity   5/5, no drift with 5-/5 hand grip  Lower extremity   5/5     Lower extremity   5/5 Tone and bulk:normal tone throughout; no atrophy noted Sensory: Pinprick and light touch decreased in the LLE Deep Tendon Reflexes: 2+ and symmetric with absent AJ's bilaterally Plantars: Right: downgoing   Left: downgoing Cerebellar: normal finger-to-nose and normal heel-to-shin testing bilaterally Gait: not tested due to safety   Laboratory Studies:  Basic Metabolic Panel:  Recent Labs Lab 10/01/14 1332 10/02/14 0415  NA 140 142  K 3.8 3.0*  CL 103 101  CO2 24 32  GLUCOSE 86 106*  BUN 18 18  CREATININE 0.57 0.57  CALCIUM 9.3 9.1    Liver Function Tests:  Recent Labs Lab 10/01/14 1332 10/02/14 0415  AST 53* 48*  ALT 24 24  ALKPHOS 46 39  BILITOT 0.3 0.5  PROT  7.6 7.9  ALBUMIN 4.4 4.3   No results for input(s): LIPASE, AMYLASE in the last 168 hours. No results for input(s): AMMONIA in the last 168 hours.  CBC:  Recent Labs Lab 10/01/14 1332 10/02/14 0415  WBC 6.7 6.0  HGB 13.5 13.5  HCT 42.1 42.8  MCV 90.1 89.5  PLT 180 187    Cardiac Enzymes:  Recent Labs Lab 10/01/14 1332  TROPONINI <0.03    BNP: Invalid input(s): POCBNP  CBG:  Recent Labs Lab 10/01/14 1852 10/01/14 2129 10/02/14 0721  GLUCAP 116* 104* 34    Microbiology: Results for orders placed or performed during the hospital encounter of 12/21/11  Stool culture     Status: None   Collection Time: 12/22/11  1:00 AM  Result Value Ref Range Status   Specimen Description STOOL  Final   Special Requests NONE  Final   Culture   Final    NO SALMONELLA, SHIGELLA, CAMPYLOBACTER, OR YERSINIA ISOLATED   Report Status 12/25/2011 FINAL  Final  Clostridium Difficile by PCR     Status: None   Collection Time: 12/22/11  1:00 AM  Result Value Ref Range Status   C difficile by pcr NEGATIVE NEGATIVE Final    Coagulation Studies: No results for input(s): LABPROT, INR in the last 72 hours.  Urinalysis:  Recent Labs Lab 10/01/14 1343  COLORURINE YELLOW  LABSPEC 1.010  PHURINE 6.5  GLUCOSEU NEGATIVE  HGBUR NEGATIVE  BILIRUBINUR NEGATIVE  KETONESUR NEGATIVE  PROTEINUR NEGATIVE  UROBILINOGEN 0.2  NITRITE NEGATIVE  LEUKOCYTESUR NEGATIVE    Lipid Panel:    Component Value Date/Time   CHOL 259* 10/02/2014 0415   TRIG 523* 10/02/2014 0415   HDL 33* 10/02/2014 0415   CHOLHDL 7.8 10/02/2014 0415   VLDL UNABLE TO CALCULATE IF TRIGLYCERIDE OVER  400 mg/dL 10/02/2014 0415   LDLCALC UNABLE TO CALCULATE IF TRIGLYCERIDE OVER 400 mg/dL 10/02/2014 0415    HgbA1C:  Lab Results  Component Value Date   HGBA1C 6.2* 12/21/2011    Urine Drug Screen:  No results found for: LABOPIA, COCAINSCRNUR, LABBENZ, AMPHETMU, THCU, LABBARB  Alcohol Level: No results for input(s):  ETH in the last 168 hours.  Other results: EKG: sinus rhythm at 92 bpm.  Imaging: Dg Chest 2 View  10/01/2014   CLINICAL DATA:  Left-sided numbness for 3 months off and on. Mild shortness of breath.  EXAM: CHEST  2 VIEW  COMPARISON:  None.  FINDINGS: The heart size and mediastinal contours are within normal limits. Both lungs are clear. Mild degenerative changes in the spine.  IMPRESSION: No active cardiopulmonary disease.   Electronically Signed   By: Shon Hale M.D.   On: 10/01/2014 14:02   Ct Head Wo Contrast  10/01/2014   CLINICAL DATA:  Left-sided numbness and tingling.  EXAM: CT HEAD WITHOUT CONTRAST  TECHNIQUE: Contiguous axial images were obtained from the base of the skull through the vertex without intravenous contrast.  COMPARISON:  Report from CT head 06/13/2003, images not available  FINDINGS: Small hypodensities within the right and left basal ganglia consistent lacunar infarcts, not reported on remote prior exam, however age indeterminate. No evidence of territorial infarct. The basilar artery appears ectatic, this was described previously. No intracranial hemorrhage, mass effect, or midline shift. No hydrocephalus. No intracranial fluid collection. Calvarium is intact. Included paranasal sinuses and mastoid air cells are well aerated.  IMPRESSION: 1. Age-indeterminate lacunar infarcts in the right and left basal ganglia. 2. Ectasia of the basilar artery, described previously.   Electronically Signed   By: Jeb Levering M.D.   On: 10/01/2014 14:17   Mr [name] Head Wo Contrast  10/01/2014   CLINICAL DATA:  Stroke.  Left-sided numbness and tingling  EXAM: MRI HEAD WITHOUT CONTRAST  MRA HEAD WITHOUT CONTRAST  TECHNIQUE: Multiplanar, multiecho pulse sequences of the brain and surrounding structures were obtained without intravenous contrast. Angiographic images of the head were obtained using MRA technique without contrast.  COMPARISON:  CT head 10/01/2014  FINDINGS: MRI HEAD FINDINGS   Negative for acute infarct. Moderate chronic microvascular ischemic changes in the cerebral white matter bilaterally. 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 Chronic ischemia in the pons.  Scattered areas of chronic micro hemorrhage in the brain bilaterally. Approximately 10 such areas of micro hemorrhage are present.  Ventricle size is normal. Cerebral volume normal for age. Pituitary normal in size.  Negative for mass or edema.  No shift of the midline structures.  MRA HEAD FINDINGS  Dominant left vertebral artery which is widely patent. Right vertebral artery also patent to the basilar. The basilar is tortuous and enlarged compatible with atherosclerotic ectasia. Left PICA patent. Right PICA not visualized. Right AICA is visualized. Superior cerebellar and posterior cerebral arteries are patent bilaterally.  Internal carotid artery patent bilaterally without stenosis. Anterior and middle cerebral arteries widely patent.  Negative for cerebral aneurysm.  IMPRESSION: Negative for acute infarct.  Moderately severe chronic microvascular ischemic changes. Moderately severe chronic micro hemorrhage in the brain. 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 No focal intracranial stenosis.   Electronically Signed   By: Franchot Gallo M.D.   On: 10/01/2014 18:44   Mr Brain Wo Contrast  10/01/2014   CLINICAL DATA:  Stroke.  Left-sided numbness  and tingling  EXAM: MRI HEAD WITHOUT CONTRAST  MRA HEAD WITHOUT CONTRAST  TECHNIQUE: Multiplanar, multiecho pulse sequences of the brain and surrounding structures were obtained without intravenous contrast. Angiographic images of the head were obtained using MRA technique without contrast.  COMPARISON:  CT head 10/01/2014  FINDINGS: MRI HEAD FINDINGS  Negative for acute infarct. Moderate chronic microvascular ischemic changes in the cerebral white matter bilaterally. Chronic  lacunar infarctions in the basal ganglia and thalamus bilaterally. Chronic ischemia in the pons.  Scattered areas of chronic micro hemorrhage in the brain bilaterally. Approximately 10 such areas of micro hemorrhage are present.  Ventricle size is normal. Cerebral volume normal for age. Pituitary normal in size.  Negative for mass or edema.  No shift of the midline structures.  MRA HEAD FINDINGS  Dominant left vertebral artery which is widely patent. Right vertebral artery also patent to the basilar. The basilar is tortuous and enlarged compatible with atherosclerotic ectasia. Left PICA patent. Right PICA not visualized. Right AICA is visualized. Superior cerebellar and posterior cerebral arteries are patent bilaterally.  Internal carotid artery patent bilaterally without stenosis. Anterior and middle cerebral arteries widely patent.  Negative for cerebral aneurysm.  IMPRESSION: Negative for acute infarct.  Moderately severe chronic microvascular ischemic changes. Moderately severe chronic micro hemorrhage in the brain. 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 No focal intracranial stenosis.   Electronically Signed   By: Franchot Gallo M.D.   On: 10/01/2014 18:44    Assessment: 58 y.o. female presenting with left sided numbness.  Symptoms have improved but not resolved completely.  MRI of the brain personally reviewed and shows no evidence of acute infarct but does show numerous chronic lacunes and micro hemorrhages.  Although a MR negative infarct remains in the differential, can not rule out a more chronic event that has resulted in intermittent symptoms.  Patient on no antiplatelet therapy at home.  LDL unable to be calculated secondary to high triglycerides.  Further work up recommended.    Stroke Risk Factors - diabetes mellitus, hyperlipidemia and hypertension  Plan: 1. HgbA1c pending 2. PT consult, OT consult, Speech consult 3.  Echocardiogram 4. Carotid dopplers 5. Prophylactic therapy-Patient reports that she has been advised against aspirin therapy by her gastroenterologist.  Will need to contact to determine if Plavix or low dose ASA will be acceptable.   6. NPO until RN stroke swallow screen 7. Telemetry monitoring 8. Frequent neuro checks   Alexis Goodell, MD [facility] [phone] 10/02/2014, 11:05 AM

## 2014-10-03 ENCOUNTER — Observation Stay (HOSPITAL_COMMUNITY): Payer: Medicare Other

## 2014-10-03 DIAGNOSIS — M25552 Pain in left hip: Secondary | ICD-10-CM | POA: Insufficient documentation

## 2014-10-03 DIAGNOSIS — I10 Essential (primary) hypertension: Secondary | ICD-10-CM | POA: Insufficient documentation

## 2014-10-03 LAB — BASIC METABOLIC PANEL
Anion gap: 10 (ref 5–15)
BUN: 16 mg/dL (ref 6–23)
CO2: 29 mmol/L (ref 19–32)
CREATININE: 0.53 mg/dL (ref 0.50–1.10)
Calcium: 9.7 mg/dL (ref 8.4–10.5)
Chloride: 98 mEq/L (ref 96–112)
GFR calc Af Amer: >90 mL/min (ref >90)
GFR calc non Af Amer: >90 mL/min (ref >90)
Glucose, Bld: 148 mg/dL — ABNORMAL HIGH (ref 70–99)
POTASSIUM: 3.8 mmol/L (ref 3.5–5.1)
Sodium: 137 mmol/L (ref 135–145)

## 2014-10-03 LAB — GLUCOSE, CAPILLARY: Glucose-Capillary: 150 mg/dL — ABNORMAL HIGH (ref 70–99)

## 2014-10-03 LAB — CK: Total CK: 72 U/L (ref 7–177)

## 2014-10-03 LAB — MAGNESIUM: Magnesium: 2 mg/dL (ref 1.5–2.5)

## 2014-10-03 MED ORDER — METOPROLOL TARTRATE 25 MG PO TABS
25.0000 mg | ORAL_TABLET | Freq: Two times a day (BID) | ORAL | Status: DC
  Administered 2014-10-03: 25 mg via ORAL
  Filled 2014-10-03: qty 1

## 2014-10-03 MED ORDER — EZETIMIBE-SIMVASTATIN 10-20 MG PO TABS: 1.0000 | ORAL_TABLET | Freq: Every day | ORAL | Status: DC

## 2014-10-03 MED ORDER — SIMVASTATIN 20 MG PO TABS: 20.0000 mg | ORAL_TABLET | Freq: Every day | ORAL | Status: DC

## 2014-10-03 MED ORDER — METOPROLOL TARTRATE 25 MG PO TABS: 25.0000 mg | ORAL_TABLET | Freq: Two times a day (BID) | ORAL | Status: DC

## 2014-10-03 MED ORDER — ASPIRIN EC 81 MG PO TBEC: 81.0000 mg | DELAYED_RELEASE_TABLET | Freq: Every day | ORAL | Status: DC

## 2014-10-03 MED ORDER — NIACIN ER 250 MG PO CPCR: 250.0000 mg | ORAL_CAPSULE | Freq: Every day | ORAL | Status: DC

## 2014-10-03 MED ORDER — EZETIMIBE-SIMVASTATIN 10-20 MG PO TABS
1.0000 | ORAL_TABLET | Freq: Every day | ORAL | Status: DC
  Filled 2014-10-03: qty 1

## 2014-10-03 NOTE — Progress Notes (Signed)
UR completed 

## 2014-10-03 NOTE — Discharge Summary (Addendum)
Physician Discharge Summary  Destiny Robinson BJY:[id number] DOB: 1956-08-07 DOA: 10/01/2014  PCP: [name], MD  Admit date: 10/01/2014 Discharge date: 10/03/2014  Recommendations for Outpatient Follow-up:  1. Pt will need to follow up with PCP in 2 weeks post discharge 2. Please obtain BMP and CBC in one week  Discharge Diagnoses:  Left-sided dysesthesia/Sensory disturbance -Some improvement but not completely resolved -MRI brain negative for acute infarct but shows areas of chronic lacunar infarcts in the basal ganglia and thalamus -Continue aspirin 81 mg daily after d/c--I have discussed the risks, benefits, and alternatives with the patient. Given the patient's multiple cardiovascular risk factors and current clinical presentation, the benefit outweighs the risk at this time. -Carotid duplex--negative for hemodynamically significant stenosis -Echocardiogram--EF 45-50 percent, grade 1 diastolic dysfunction -Hemoglobin A1c--6.6 -Total cholesterol 259, LDL unable to be calculated due to high triglycerides -Appreciate neurology input--they agreed with present management and finishing the stroke workup. -Physical therapy and occupational therapy were consulted and did not recommend any further follow-up Hyperlipidemia -Restart fenofibrate -Add niacin at bedtime -patient states that she cannot tolerate Crestor due to muscle aches -Patient stated that the only statin she can take is in the form of Vytorin--> after discharge, the patient stated that she cannot afford the Vytorin at the pharmacy; as a result, simvastatin 20 mg was substituted. -Since simvastatin has interaction with the patient's amlodipine, her amlodipine will be discontinued, and the patient will be started on metoprolol tartrate -07/16/2014 LDL 72--patient endorses compliance with medications Hypertension -Continue HCTZ, losartan -Discontinue amlodipine due to its interaction with simvastatin -Start  metoprolol tartrate Diabetes mellitus type 2 -Continue home dose NPH -NovoLog sliding scale -Patient also has history of diabetic gastroparesis -Hemoglobin A1c--6.6 Anxiety/depression -Continue Effexor -Restart home dose of Ativan Morbid obesity -BMI 36.1 Left hip pain -X-ray of the left hip--neg for fracture or dislocation, +mild OA -the patient worked with physical therapy and occupational therapy without difficulty and was able to bear weight on her bilateral lower extremities  On the day of discharge Discharge Condition: stable   Disposition: home  Dietcarb modified Wt Readings from Last 3 Encounters:  10/01/14 89.4 kg (197 lb 1.5 oz)  10/01/14 89.359 kg (197 lb)  04/16/14 96.163 kg (212 lb)    History of present illness:  58 y.o. female with a history of diabetes mellitus, hypertension, hyperlipidemia, diabetic neuropathy, depression, anxiety who reports that she has had episodes of numbness on the left side for the past 3 months. She reports that they involve the left face, arm and leg. They usually last about thirty minutes and resolve spontaneously. Her last prior to this event was on 12/27. On the day of admission the patient awakened normal. At about 0800 she developed numbness on her left including her arm and leg. She can not recall if her face was involved. When her symptoms did not spontaneously resolve as they had in the past, she presented for evaluation. Symptoms have improved somewhat since admission.The patient was admitted and a full stroke workup was obtained. MRI of the brain was negative for any acute infarction. MRA of the brain did not reveal any hemodynamically significant intracranial stenosis. Carotid duplex was negative for any hemodynamically significant stenosis. Echocardiogram did not show any intracardiac thrombi. The patient was started on aspirin. Her risk factors were modified with the addition of Vytorin and niacin. Her hemoglobin A1c was 6.6.  Physical therapy and occupational therapy worked with the patient and did not feel that she needed any further follow-up.  Consultants: Neurology--Dr. Doy Mince  Discharge Exam: Filed Vitals:   10/03/14 0610  BP: 139/81  Pulse: 92  Temp: 97.7 F (36.5 C)  Resp: 18   Filed Vitals:   10/02/14 1249 10/02/14 1626 10/02/14 2045 10/03/14 0610  BP: 161/93 160/88 152/93 139/81  Pulse: 87  91 92  Temp: 98.2 F (36.8 C)  98.1 F (36.7 C) 97.7 F (36.5 C)  TempSrc: Oral  Oral Oral  Resp: 18  18 18   Height:      Weight:      SpO2: 96%  98% 94%   General: A&O x 3, NAD, pleasant, cooperative Cardiovascular: RRR, no rub, no gallop, no S3 Respiratory: CTAB, no wheeze, no rhonchi Abdomen:soft, nontender, nondistended, positive bowel sounds Extremities: No edema, No lymphangitis, no petechiae  Discharge Instructions     Medication List    STOP taking these medications        amLODipine 5 MG tablet  Commonly known as:  NORVASC     fexofenadine-pseudoephedrine 60-120 MG per tablet  Commonly known as:  ALLEGRA-D     fluconazole 100 MG tablet  Commonly known as:  DIFLUCAN      TAKE these medications        acetaminophen 500 MG tablet  Commonly known as:  TYLENOL  Take 1,000 mg by mouth daily as needed for fever or headache (headache).     PROAIR HFA 108 (90 BASE) MCG/ACT inhaler  Generic drug:  albuterol  USE 2 PUFFS EVERY 6 HOURS AS NEEDED FOR WHEEZING     albuterol (2.5 MG/3ML) 0.083% nebulizer solution  Commonly known as:  PROVENTIL  Take 2.5 mg by nebulization 4 (four) times daily as needed for wheezing.     ALIGN PO  Take 1 capsule by mouth daily.     aspirin EC 81 MG tablet  Take 1 tablet (81 mg total) by mouth daily.     BENTYL 10 MG capsule  Generic drug:  dicyclomine  Take 10 mg by mouth 4 (four) times daily -  before meals and at bedtime.     calcitonin (salmon) 200 UNIT/ACT nasal spray  Commonly known as:  MIACALCIN/FORTICAL  Place 1 spray into  alternate nostrils daily.     calcium carbonate 600 MG Tabs tablet  Commonly known as:  OS-CAL  Take 600 mg by mouth daily.     Co Q-10 100 MG Caps  Take 100 mg by mouth at bedtime.     CREON 24000 UNITS Cpep  Generic drug:  Pancrelipase (Lip-Prot-Amyl)  Take 1 capsule by mouth 2 (two) times daily.     ezetimibe-simvastatin 10-20 MG per tablet  Commonly known as:  VYTORIN  Take 1 tablet by mouth daily at 6 PM.     Fenofibric Acid 135 MG Cpdr  Take 135 mg by mouth at bedtime.     gabapentin 300 MG capsule  Commonly known as:  NEURONTIN  TAKE (2) CAPSULES THREE TIMES DAILY. 9AM, 3PM, 9PM     glipiZIDE 10 MG tablet  Commonly known as:  GLUCOTROL  Take 10 mg by mouth daily before breakfast.     hydrochlorothiazide 25 MG tablet  Commonly known as:  HYDRODIURIL  Take 25 mg by mouth daily.     HYDROcodone-acetaminophen 5-325 MG per tablet  Commonly known as:  NORCO/VICODIN  Take 1 tablet by mouth every 4 (four) hours as needed.     LORazepam 0.5 MG tablet  Commonly known as:  ATIVAN  Take 0.5 mg by mouth  4 (four) times daily. FOR ANXIETY     losartan 100 MG tablet  Commonly known as:  COZAAR  Take 100 mg by mouth daily.     metFORMIN 1000 MG tablet  Commonly known as:  GLUCOPHAGE  Take 1,000 mg by mouth 2 (two) times daily with a meal.     methocarbamol 500 MG tablet  Commonly known as:  ROBAXIN  Take 1 tablet (500 mg total) by mouth 3 (three) times daily.     metoprolol tartrate 25 MG tablet  Commonly known as:  LOPRESSOR  Take 1 tablet (25 mg total) by mouth 2 (two) times daily.     mometasone-formoterol 100-5 MCG/ACT Aero  Commonly known as:  DULERA  2 puffs then rinse mouth, twice daily     multivitamin with minerals Tabs tablet  Take 1 tablet by mouth daily.     niacin 250 MG CR capsule  Take 1 capsule (250 mg total) by mouth at bedtime.     NOVOLIN N RELION [place]  Inject 44 Units into the skin at bedtime.     ondansetron 4 MG tablet  Commonly known  as:  ZOFRAN  Take 4 mg by mouth daily as needed for nausea or vomiting.     pantoprazole 40 MG tablet  Commonly known as:  PROTONIX  Take 40 mg by mouth 2 (two) times daily.     potassium chloride 10 MEQ tablet  Commonly known as:  K-DUR  Take 10 mEq by mouth every morning.     promethazine 25 MG tablet  Commonly known as:  PHENERGAN  Take 25 mg by mouth every 6 (six) hours as needed for nausea or vomiting.     venlafaxine 75 MG tablet  Commonly known as:  EFFEXOR  Take 75 mg by mouth 2 (two) times daily.     VITAMIN D PO  Take 1 tablet by mouth daily.         The results of significant diagnostics from this hospitalization (including imaging, microbiology, ancillary and laboratory) are listed below for reference.    Significant Diagnostic Studies: Dg Chest 2 View  10/01/2014   CLINICAL DATA:  Left-sided numbness for 3 months off and on. Mild shortness of breath.  EXAM: CHEST  2 VIEW  COMPARISON:  None.  FINDINGS: The heart size and mediastinal contours are within normal limits. Both lungs are clear. Mild degenerative changes in the spine.  IMPRESSION: No active cardiopulmonary disease.   Electronically Signed   By: Shon Hale M.D.   On: 10/01/2014 14:02   Dg Hip Complete Left  10/03/2014   CLINICAL DATA:  One day history of left hip pain  EXAM: LEFT HIP - COMPLETE 2+ VIEW  COMPARISON:  None.  FINDINGS: Frontal pelvis as well as frontal and lateral left hip images were obtained. There is no fracture or dislocation. There is symmetric osteoarthritic change in each hip joint. No erosive change.  IMPRESSION: Symmetric osteoarthritic change in each hip joint, fairly mild on both sides. No fracture or dislocation.   Electronically Signed   By: Lowella Grip M.D.   On: 10/03/2014 13:32   Ct Head Wo Contrast  10/01/2014   CLINICAL DATA:  Left-sided numbness and tingling.  EXAM: CT HEAD WITHOUT CONTRAST  TECHNIQUE: Contiguous axial images were obtained from the base of the skull  through the vertex without intravenous contrast.  COMPARISON:  Report from CT head 06/13/2003, images not available  FINDINGS: Small hypodensities within the right and left basal  ganglia consistent lacunar infarcts, not reported on remote prior exam, however age indeterminate. No evidence of territorial infarct. The basilar artery appears ectatic, this was described previously. No intracranial hemorrhage, mass effect, or midline shift. No hydrocephalus. No intracranial fluid collection. Calvarium is intact. Included paranasal sinuses and mastoid air cells are well aerated.  IMPRESSION: 1. Age-indeterminate lacunar infarcts in the right and left basal ganglia. 2. Ectasia of the basilar artery, described previously.   Electronically Signed   By: Jeb Levering M.D.   On: 10/01/2014 14:17   Mr [name] Head Wo Contrast  10/01/2014   CLINICAL DATA:  Stroke.  Left-sided numbness and tingling  EXAM: MRI HEAD WITHOUT CONTRAST  MRA HEAD WITHOUT CONTRAST  TECHNIQUE: Multiplanar, multiecho pulse sequences of the brain and surrounding structures were obtained without intravenous contrast. Angiographic images of the head were obtained using MRA technique without contrast.  COMPARISON:  CT head 10/01/2014  FINDINGS: MRI HEAD FINDINGS  Negative for acute infarct. Moderate chronic microvascular ischemic changes in the cerebral white matter bilaterally. 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 Chronic ischemia in the pons.  Scattered areas of chronic micro hemorrhage in the brain bilaterally. Approximately 10 such areas of micro hemorrhage are present.  Ventricle size is normal. Cerebral volume normal for age. Pituitary normal in size.  Negative for mass or edema.  No shift of the midline structures.  MRA HEAD FINDINGS  Dominant left vertebral artery which is widely patent. Right vertebral artery also patent to the basilar. The basilar is tortuous and enlarged compatible with atherosclerotic  ectasia. Left PICA patent. Right PICA not visualized. Right AICA is visualized. Superior cerebellar and posterior cerebral arteries are patent bilaterally.  Internal carotid artery patent bilaterally without stenosis. Anterior and middle cerebral arteries widely patent.  Negative for cerebral aneurysm.  IMPRESSION: Negative for acute infarct.  Moderately severe chronic microvascular ischemic changes. Moderately severe chronic micro hemorrhage in the brain. 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 No focal intracranial stenosis.   Electronically Signed   By: Franchot Gallo M.D.   On: 10/01/2014 18:44   Mr Brain Wo Contrast  10/01/2014   CLINICAL DATA:  Stroke.  Left-sided numbness and tingling  EXAM: MRI HEAD WITHOUT CONTRAST  MRA HEAD WITHOUT CONTRAST  TECHNIQUE: Multiplanar, multiecho pulse sequences of the brain and surrounding structures were obtained without intravenous contrast. Angiographic images of the head were obtained using MRA technique without contrast.  COMPARISON:  CT head 10/01/2014  FINDINGS: MRI HEAD FINDINGS  Negative for acute infarct. Moderate chronic microvascular ischemic changes in the cerebral white matter bilaterally. 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 Chronic ischemia in the pons.  Scattered areas of chronic micro hemorrhage in the brain bilaterally. Approximately 10 such areas of micro hemorrhage are present.  Ventricle size is normal. Cerebral volume normal for age. Pituitary normal in size.  Negative for mass or edema.  No shift of the midline structures.  MRA HEAD FINDINGS  Dominant left vertebral artery which is widely patent. Right vertebral artery also patent to the basilar. The basilar is tortuous and enlarged compatible with atherosclerotic ectasia. Left PICA patent. Right PICA not visualized. Right AICA is visualized. Superior cerebellar and posterior cerebral arteries  are patent bilaterally.  Internal carotid artery patent bilaterally without stenosis. Anterior and middle cerebral arteries widely patent.  Negative for cerebral aneurysm.  IMPRESSION: Negative for acute infarct.  Moderately severe chronic microvascular ischemic  changes. Moderately severe chronic micro hemorrhage in the brain. 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 No focal intracranial stenosis.   Electronically Signed   By: Franchot Gallo M.D.   On: 10/01/2014 18:44     Microbiology: No results found for this or any previous visit (from the past 240 hour(s)).   Labs: Basic Metabolic Panel:  Recent Labs Lab 10/01/14 1332 10/02/14 0415 10/03/14 0430  NA 140 142 137  K 3.8 3.0* 3.8  CL 103 101 98  CO2 24 32 29  GLUCOSE 86 106* 148*  BUN 18 18 16   CREATININE 0.57 0.57 0.53  CALCIUM 9.3 9.1 9.7  MG  --   --  2.0   Liver Function Tests:  Recent Labs Lab 10/01/14 1332 10/02/14 0415  AST 53* 48*  ALT 24 24  ALKPHOS 46 39  BILITOT 0.3 0.5  PROT 7.6 7.9  ALBUMIN 4.4 4.3   No results for input(s): LIPASE, AMYLASE in the last 168 hours. No results for input(s): AMMONIA in the last 168 hours. CBC:  Recent Labs Lab 10/01/14 1332 10/02/14 0415  WBC 6.7 6.0  HGB 13.5 13.5  HCT 42.1 42.8  MCV 90.1 89.5  PLT 180 187   Cardiac Enzymes:  Recent Labs Lab 10/01/14 1332 10/03/14 0430  CKTOTAL  --  72  TROPONINI <0.03  --    BNP: Invalid input(s): POCBNP CBG:  Recent Labs Lab 10/02/14 1134 10/02/14 1358 10/02/14 1636 10/02/14 2042 10/03/14 0734  GLUCAP 76 135* 154* 161* 150*    Time coordinating discharge:  Greater than 30 minutes  Signed:  Meiya Wisler, DO [facility] Pager: [phone] 10/03/2014, 2:56 PM

## 2014-10-03 NOTE — Progress Notes (Addendum)
Subjective: Patient complains of left hip pain but no new focal neurological complaints.  She has done well with therapy and no therapy needs are required per notes.  Patient reports that her left leg is better but continues to feel somewhat heavy.   Objective: Current vital signs: BP 139/81 mmHg  Pulse 92  Temp(Src) 97.7 F (36.5 C) (Oral)  Resp 18  Ht 5\' 2"  (1.575 m)  Wt 89.4 kg (197 lb 1.5 oz)  BMI 36.04 kg/m2  SpO2 94% Vital signs in last 24 hours: Temp:  [97.7 F (36.5 C)-98.1 F (36.7 C)] 97.7 F (36.5 C) (12/30 0610) Pulse Rate:  [91-92] 92 (12/30 0610) Resp:  [18] 18 (12/30 0610) BP: (139-160)/(81-93) 139/81 mmHg (12/30 0610) SpO2:  [94 %-98 %] 94 % (12/30 0610)  Intake/Output from previous day: 12/29 0701 - 12/30 0700 In: 240 [P.O.:240] Out: -  Intake/Output this shift:   Nutritional status: Diet heart healthy/carb modified  Neurologic Exam: Mental Status: Alert, oriented, thought content appropriate. Speech fluent without evidence of aphasia. Able to follow 3 step commands without difficulty. Cranial Nerves: II: Discs flat bilaterally; Decreased right peripheral vision, pupils equal, round, reactive to light and accommodation III,IV, VI: ptosis not present, extra-ocular motions intact bilaterally V,VII: smile symmetric, facial light touch sensation normal bilaterally VIII: hearing normal bilaterally IX,X: gag reflex present XI: bilateral shoulder shrug XII: midline tongue extension Motor: Right :Upper extremity 5/5Left: Upper extremity 5/5, no drift  Lower extremity 5/5Lower extremity 5/5 Tone and bulk:normal tone throughout; no atrophy noted Sensory: Pinprick and light touch decreased in the LLE Deep Tendon Reflexes: 2+ and symmetric with absent AJ's bilaterally Plantars: Right: downgoingLeft:  downgoing Cerebellar: normal finger-to-nose and normal heel-to-shin testing bilaterally   Lab Results: Basic Metabolic Panel:  Recent Labs Lab 10/01/14 1332 10/02/14 0415 10/03/14 0430  NA 140 142 137  K 3.8 3.0* 3.8  CL 103 101 98  CO2 24 32 29  GLUCOSE 86 106* 148*  BUN 18 18 16   CREATININE 0.57 0.57 0.53  CALCIUM 9.3 9.1 9.7  MG  --   --  2.0    Liver Function Tests:  Recent Labs Lab 10/01/14 1332 10/02/14 0415  AST 53* 48*  ALT 24 24  ALKPHOS 46 39  BILITOT 0.3 0.5  PROT 7.6 7.9  ALBUMIN 4.4 4.3   No results for input(s): LIPASE, AMYLASE in the last 168 hours. No results for input(s): AMMONIA in the last 168 hours.  CBC:  Recent Labs Lab 10/01/14 1332 10/02/14 0415  WBC 6.7 6.0  HGB 13.5 13.5  HCT 42.1 42.8  MCV 90.1 89.5  PLT 180 187    Cardiac Enzymes:  Recent Labs Lab 10/01/14 1332 10/03/14 0430  CKTOTAL  --  72  TROPONINI <0.03  --     Lipid Panel:  Recent Labs Lab 10/02/14 0415  CHOL 259*  TRIG 523*  HDL 33*  CHOLHDL 7.8  VLDL UNABLE TO CALCULATE IF TRIGLYCERIDE OVER 400 mg/dL  LDLCALC UNABLE TO CALCULATE IF TRIGLYCERIDE OVER 400 mg/dL    CBG:  Recent Labs Lab 10/02/14 1134 10/02/14 1358 10/02/14 1636 10/02/14 2042 10/03/14 0734  GLUCAP 76 135* 154* 161* 150*    Microbiology: Results for orders placed or performed during the hospital encounter of 12/21/11  Stool culture     Status: None   Collection Time: 12/22/11  1:00 AM  Result Value Ref Range Status   Specimen Description STOOL  Final   Special Requests NONE  Final   Culture  Final    NO SALMONELLA, SHIGELLA, CAMPYLOBACTER, OR YERSINIA ISOLATED   Report Status 12/25/2011 FINAL  Final  Clostridium Difficile by PCR     Status: None   Collection Time: 12/22/11  1:00 AM  Result Value Ref Range Status   C difficile by pcr NEGATIVE NEGATIVE Final    Coagulation Studies: No results for input(s): LABPROT, INR in the last 72 hours.  Imaging: Dg Chest 2  View  10/01/2014   CLINICAL DATA:  Left-sided numbness for 3 months off and on. Mild shortness of breath.  EXAM: CHEST  2 VIEW  COMPARISON:  None.  FINDINGS: The heart size and mediastinal contours are within normal limits. Both lungs are clear. Mild degenerative changes in the spine.  IMPRESSION: No active cardiopulmonary disease.   Electronically Signed   By: Shon Hale M.D.   On: 10/01/2014 14:02   Ct Head Wo Contrast  10/01/2014   CLINICAL DATA:  Left-sided numbness and tingling.  EXAM: CT HEAD WITHOUT CONTRAST  TECHNIQUE: Contiguous axial images were obtained from the base of the skull through the vertex without intravenous contrast.  COMPARISON:  Report from CT head 06/13/2003, images not available  FINDINGS: Small hypodensities within the right and left basal ganglia consistent lacunar infarcts, not reported on remote prior exam, however age indeterminate. No evidence of territorial infarct. The basilar artery appears ectatic, this was described previously. No intracranial hemorrhage, mass effect, or midline shift. No hydrocephalus. No intracranial fluid collection. Calvarium is intact. Included paranasal sinuses and mastoid air cells are well aerated.  IMPRESSION: 1. Age-indeterminate lacunar infarcts in the right and left basal ganglia. 2. Ectasia of the basilar artery, described previously.   Electronically Signed   By: Jeb Levering M.D.   On: 10/01/2014 14:17   Mr [name] Head Wo Contrast  10/01/2014   CLINICAL DATA:  Stroke.  Left-sided numbness and tingling  EXAM: MRI HEAD WITHOUT CONTRAST  MRA HEAD WITHOUT CONTRAST  TECHNIQUE: Multiplanar, multiecho pulse sequences of the brain and surrounding structures were obtained without intravenous contrast. Angiographic images of the head were obtained using MRA technique without contrast.  COMPARISON:  CT head 10/01/2014  FINDINGS: MRI HEAD FINDINGS  Negative for acute infarct. Moderate chronic microvascular ischemic changes in the cerebral white  matter bilaterally. 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 Chronic ischemia in the pons.  Scattered areas of chronic micro hemorrhage in the brain bilaterally. Approximately 10 such areas of micro hemorrhage are present.  Ventricle size is normal. Cerebral volume normal for age. Pituitary normal in size.  Negative for mass or edema.  No shift of the midline structures.  MRA HEAD FINDINGS  Dominant left vertebral artery which is widely patent. Right vertebral artery also patent to the basilar. The basilar is tortuous and enlarged compatible with atherosclerotic ectasia. Left PICA patent. Right PICA not visualized. Right AICA is visualized. Superior cerebellar and posterior cerebral arteries are patent bilaterally.  Internal carotid artery patent bilaterally without stenosis. Anterior and middle cerebral arteries widely patent.  Negative for cerebral aneurysm.  IMPRESSION: Negative for acute infarct.  Moderately severe chronic microvascular ischemic changes. Moderately severe chronic micro hemorrhage in the brain. 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 No focal intracranial stenosis.   Electronically Signed   By: Franchot Gallo M.D.   On: 10/01/2014 18:44   Mr Brain Wo Contrast  10/01/2014   CLINICAL DATA:  Stroke.  Left-sided numbness and tingling  EXAM: MRI HEAD WITHOUT CONTRAST  MRA HEAD WITHOUT CONTRAST  TECHNIQUE: Multiplanar, multiecho pulse sequences of the brain and surrounding structures were obtained without intravenous contrast. Angiographic images of the head were obtained using MRA technique without contrast.  COMPARISON:  CT head 10/01/2014  FINDINGS: MRI HEAD FINDINGS  Negative for acute infarct. Moderate chronic microvascular ischemic changes in the cerebral white matter bilaterally. 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 Chronic ischemia in the pons.   Scattered areas of chronic micro hemorrhage in the brain bilaterally. Approximately 10 such areas of micro hemorrhage are present.  Ventricle size is normal. Cerebral volume normal for age. Pituitary normal in size.  Negative for mass or edema.  No shift of the midline structures.  MRA HEAD FINDINGS  Dominant left vertebral artery which is widely patent. Right vertebral artery also patent to the basilar. The basilar is tortuous and enlarged compatible with atherosclerotic ectasia. Left PICA patent. Right PICA not visualized. Right AICA is visualized. Superior cerebellar and posterior cerebral arteries are patent bilaterally.  Internal carotid artery patent bilaterally without stenosis. Anterior and middle cerebral arteries widely patent.  Negative for cerebral aneurysm.  IMPRESSION: Negative for acute infarct.  Moderately severe chronic microvascular ischemic changes. Moderately severe chronic micro hemorrhage in the brain. 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 No focal intracranial stenosis.   Electronically Signed   By: Franchot Gallo M.D.   On: 10/01/2014 18:44    Medications:  I have reviewed the patient's current medications. Scheduled: . aspirin  325 mg Oral Daily  . ezetimibe-simvastatin  1 tablet Oral q1800  . feeding supplement (ENSURE COMPLETE)  237 mL Oral BID BM  . gabapentin  300 mg Oral BID  . glipiZIDE  10 mg Oral QAC breakfast  . heparin  5,000 Units Subcutaneous 3 times per day  . hydrochlorothiazide  25 mg Oral Daily  . insulin aspart  0-15 Units Subcutaneous TID WC  . insulin aspart  0-5 Units Subcutaneous QHS  . insulin NPH Human  44 Units Subcutaneous QHS  . lipase/protease/amylase  24,000 Units Oral BID PC  . LORazepam  0.5 mg Oral Q6H  . losartan  100 mg Oral Daily  . metoprolol tartrate  25 mg Oral BID  . niacin  250 mg Oral QHS  . pantoprazole  40 mg Oral BID  . sodium chloride  3 mL Intravenous Q12H   . venlafaxine  75 mg Oral BID    Assessment/Plan: Patient doing well and improved from admission.  Echocardiogram shows no intracardiac masses or thrombi.  EF 45-50%.  Carotid dopplers show bilateral stenosis of 1-39%.  A1c 6.6.  Recommendations: 1.  ASA 81mg  daily 2.  Follow up with neurology on an outpatient basis, 1-2 months.     LOS: 2 days   Alexis Goodell, MD [facility] [phone] 10/03/2014  1:19 PM

## 2014-10-03 NOTE — Evaluation (Signed)
Occupational Therapy Evaluation Patient Details Name: Destiny Robinson MRN: [id number] DOB: 04/26/1956 Today's Date: 10/03/2014    History of Present Illness Pt was admitted with L sided numbness.  She has h/o chronic lacunar infarcts, DM, HTN   Clinical Impression   This 58 year old female was admitted for L sided numbness, face and arm have improved.  Pt is at baseline for ADLs and is not having difficulty with strength/coordination.    No further OT is needed at this time    Follow Up Recommendations  No OT follow up    Equipment Recommendations  None recommended by OT    Recommendations for Other Services       Precautions / Restrictions Precautions Precautions: Fall Restrictions Weight Bearing Restrictions: No      Mobility Bed Mobility Overal bed mobility: Independent                Transfers Overall transfer level: Independent Equipment used: None                  Balance                                            ADL Overall ADL's : Independent;Modified independent                                       General ADL Comments: ambulated to bathroom and donned slippers.  No UE problems noted.  Pt does report that LLE is still numb from knee down, and this is worse than her normal peripheral neuropathy.  Pt is very independent, straightening bed while talking.  She was a Government social research officer      Pertinent Vitals/Pain Pain Assessment: No/denies pain     Hand Dominance     Extremity/Trunk Assessment Upper Extremity Assessment Upper Extremity Assessment: Overall WFL for tasks assessed (states numbness has resolved)   Lower Extremity Assessment Lower Extremity Assessment: Defer to PT evaluation (pt reports numbness LLE knee down; has peripheral neuropathy)       Communication Communication Communication: No difficulties   Cognition Arousal/Alertness:  Awake/alert Behavior During Therapy: WFL for tasks assessed/performed Overall Cognitive Status: Within Functional Limits for tasks assessed                     General Comments       Exercises       Shoulder Instructions      Home Living Family/patient expects to be discharged to:: Private residence Living Arrangements: Other relatives                 Bathroom Shower/Tub: Occupational psychologist: Standard     Home Equipment: Environmental consultant - 2 wheels;Cane - single point;Bedside commode;Shower seat;Shower seat - built in;Wheelchair - manual   Additional Comments: she uses cane when walking a lot; other DME from other people and she doesn't use--other than built in shower seat      Prior Functioning/Environment Level of Independence: Independent        Comments: always has brother or sister there    OT Diagnosis:  (  impaired sensation)   OT Problem List:     OT Treatment/Interventions:      OT Goals(Current goals can be found in the care plan section) Acute Rehab OT Goals Patient Stated Goal: none stated  OT Frequency:     Barriers to D/C:            Co-evaluation              End of Session    Activity Tolerance: Patient tolerated treatment well Patient left: in bed;with call bell/phone within reach   Time: 7493-5521 OT Time Calculation (min): 16 min Charges:  OT General Charges $OT Visit: 1 Procedure OT Evaluation $Initial OT Evaluation Tier I: 1 Procedure G-Codes: OT G-codes **NOT FOR INPATIENT CLASS** Functional Assessment Tool Used: clinical observation and judgment Functional Limitation: Self care Self Care Current Status (V4715): 0 percent impaired, limited or restricted Self Care Goal Status (N5396): 0 percent impaired, limited or restricted Self Care Discharge Status (D2897): 0 percent impaired, limited or restricted  Destiny Robinson 10/03/2014, 9:50 AM Lesle Chris, OTR/L [phone] 10/03/2014

## 2014-10-03 NOTE — Evaluation (Signed)
Physical Therapy One Time Evaluation Patient Details Name: Destiny Robinson MRN: [id number] DOB: Jun 02, 1956 Today's Date: 10/03/2014   History of Present Illness  Pt is a 58 year old female admitted with L sided numbness with hx of chronic lacunar infarcts, DM with neuropathy, HTN  Clinical Impression  Patient evaluated by Physical Therapy with no further acute PT needs identified. All education has been completed and the patient has no further questions.  See below for any follow-up Physical Therapy or equipment needs. PT is signing off. Thank you for this referral.     Follow Up Recommendations No PT follow up    Equipment Recommendations  None recommended by PT    Recommendations for Other Services       Precautions / Restrictions Precautions Precautions: Fall Restrictions Weight Bearing Restrictions: No      Mobility  Bed Mobility Overal bed mobility: Independent                Transfers Overall transfer level: Independent Equipment used: None                Ambulation/Gait Ambulation/Gait assistance: Modified independent (Device/Increase time) Ambulation Distance (Feet): 400 Feet Assistive device: None Gait Pattern/deviations: Step-through pattern;Decreased stance time - left     General Gait Details: tends to favor R LE however pt reports this at baseline, reason for using SPC in community, no difficulty with head turns, start/stop and walking backwards, no unsteadiness or LOB observed  Stairs            Wheelchair Mobility    Modified Rankin (Stroke Patients Only)       Balance Overall balance assessment: No apparent balance deficits (not formally assessed)                                           Pertinent Vitals/Pain Pain Assessment: No/denies pain    Home Living Family/patient expects to be discharged to:: Private residence Living Arrangements: Other relatives             Home Equipment: Environmental consultant - 2  wheels;Cane - single point;Bedside commode;Shower seat;Shower seat - built in;Wheelchair - manual Additional Comments: other DME from other people and she doesn't use--other than built in shower seat    Prior Function Level of Independence: Independent with assistive device(s)         Comments: uses SPC outside and in community     Hand Dominance        Extremity/Trunk Assessment   Upper Extremity Assessment: Overall WFL for tasks assessed           Lower Extremity Assessment: LLE deficits/detail   LLE Deficits / Details: reports left leg numbness between knee and foot, unable to feel posterior lower leg and ankle area with light touch testing, reports improved numbness since admission, present numbess possibly related to neuropathy     Communication   Communication: No difficulties  Cognition Arousal/Alertness: Awake/alert Behavior During Therapy: WFL for tasks assessed/performed Overall Cognitive Status: Within Functional Limits for tasks assessed                      General Comments      Exercises        Assessment/Plan    PT Assessment Patent does not need any further PT services  PT Diagnosis     PT Problem List  PT Treatment Interventions     PT Goals (Current goals can be found in the Care Plan section) Acute Rehab PT Goals Patient Stated Goal: none stated PT Goal Formulation: All assessment and education complete, DC therapy    Frequency     Barriers to discharge        Co-evaluation               End of Session   Activity Tolerance: Patient tolerated treatment well Patient left: in bed;with call bell/phone within reach      Functional Assessment Tool Used: clinical judgement Functional Limitation: Mobility: Walking and moving around Mobility: Walking and Moving Around Current Status [phone]): 0 percent impaired, limited or restricted Mobility: Walking and Moving Around Goal Status [phone]): 0 percent impaired, limited  or restricted Mobility: Walking and Moving Around Discharge Status [phone]): 0 percent impaired, limited or restricted    Time: 4097-3532 PT Time Calculation (min) (ACUTE ONLY): 12 min   Charges:   PT Evaluation $Initial PT Evaluation Tier I: 1 Procedure     PT G Codes:   PT G-Codes **NOT FOR INPATIENT CLASS** Functional Assessment Tool Used: clinical judgement Functional Limitation: Mobility: Walking and moving around Mobility: Walking and Moving Around Current Status (D9242): 0 percent impaired, limited or restricted Mobility: Walking and Moving Around Goal Status (A8341): 0 percent impaired, limited or restricted Mobility: Walking and Moving Around Discharge Status (D6222): 0 percent impaired, limited or restricted    Traxton Kolenda,KATHrine E 10/03/2014, 10:03 AM Carmelia Bake, PT, DPT 10/03/2014 Pager: [phone]

## 2014-10-09 ENCOUNTER — Ambulatory Visit (INDEPENDENT_AMBULATORY_CARE_PROVIDER_SITE_OTHER): Payer: Medicare Other

## 2014-10-09 VITALS — BP 159/96 | HR 92 | Resp 12

## 2014-10-09 DIAGNOSIS — M2022 Hallux rigidus, left foot: Secondary | ICD-10-CM

## 2014-10-09 DIAGNOSIS — E1142 Type 2 diabetes mellitus with diabetic polyneuropathy: Secondary | ICD-10-CM

## 2014-10-09 DIAGNOSIS — R269 Unspecified abnormalities of gait and mobility: Secondary | ICD-10-CM

## 2014-10-09 DIAGNOSIS — Q828 Other specified congenital malformations of skin: Secondary | ICD-10-CM

## 2014-10-09 DIAGNOSIS — G629 Polyneuropathy, unspecified: Secondary | ICD-10-CM

## 2014-10-09 NOTE — Patient Instructions (Signed)
Diabetes and Foot Care Diabetes may cause you to have problems because of poor blood supply (circulation) to your feet and legs. This may cause the skin on your feet to become thinner, break easier, and heal more slowly. Your skin may become dry, and the skin may peel and crack. You may also have nerve damage in your legs and feet causing decreased feeling in them. You may not notice minor injuries to your feet that could lead to infections or more serious problems. Taking care of your feet is one of the most important things you can do for yourself.  HOME CARE INSTRUCTIONS  Wear shoes at all times, even in the house. Do not go barefoot. Bare feet are easily injured.  Check your feet daily for blisters, cuts, and redness. If you cannot see the bottom of your feet, use a mirror or ask someone for help.  Wash your feet with warm water (do not use hot water) and mild soap. Then pat your feet and the areas between your toes until they are completely dry. Do not soak your feet as this can dry your skin.  Apply a moisturizing lotion or petroleum jelly (that does not contain alcohol and is unscented) to the skin on your feet and to dry, brittle toenails. Do not apply lotion between your toes.  Trim your toenails straight across. Do not dig under them or around the cuticle. File the edges of your nails with an emery board or nail file.  Do not cut corns or calluses or try to remove them with medicine.  Wear clean socks or stockings every day. Make sure they are not too tight. Do not wear knee-high stockings since they may decrease blood flow to your legs.  Wear shoes that fit properly and have enough cushioning. To break in new shoes, wear them for just a few hours a day. This prevents you from injuring your feet. Always look in your shoes before you put them on to be sure there are no objects inside.  Do not cross your legs. This may decrease the blood flow to your feet.  If you find a minor scrape,  cut, or break in the skin on your feet, keep it and the skin around it clean and dry. These areas may be cleansed with mild soap and water. Do not cleanse the area with peroxide, alcohol, or iodine.  When you remove an adhesive bandage, be sure not to damage the skin around it.  If you have a wound, look at it several times a day to make sure it is healing.  Do not use heating pads or hot water bottles. They may burn your skin. If you have lost feeling in your feet or legs, you may not know it is happening until it is too late.  Make sure your health care provider performs a complete foot exam at least annually or more often if you have foot problems. Report any cuts, sores, or bruises to your health care provider immediately. SEEK MEDICAL CARE IF:   You have an injury that is not healing.  You have cuts or breaks in the skin.  You have an ingrown nail.  You notice redness on your legs or feet.  You feel burning or tingling in your legs or feet.  You have pain or cramps in your legs and feet.  Your legs or feet are numb.  Your feet always feel cold. SEEK IMMEDIATE MEDICAL CARE IF:   There is increasing redness,   swelling, or pain in or around a wound.  There is a red line that goes up your leg.  Pus is coming from a wound.  You develop a fever or as directed by your health care provider.  You notice a bad smell coming from an ulcer or wound. Document Released: 09/18/2000 Document Revised: 05/24/2013 Document Reviewed: 02/28/2013 Exit[employer] Patient Information 2015 [employer]. This information is not intended to replace advice given to you by your health care provider. Make sure you discuss any questions you have with your health care provider.  

## 2014-10-09 NOTE — Progress Notes (Signed)
   Subjective:    Patient ID: Destiny Robinson, female    DOB: December 13, 1955, 59 y.o.   MRN: [id number]  HPI  ''RT FOOT STILL HAVING NUMBNESS FEELING AND BLISTER LOOK MUCH BETTER.''  PT STATED HAD STROKE TWICE LAST YEAR   Review of Systems  Musculoskeletal: Positive for gait problem.  Neurological: Positive for numbness and headaches.       Objective:   Physical Exam Sit 59 year old white female well-developed well-nourished oriented 3 recent episodes of stroke suspect over the last several months most recent December 28 has a history of mini strokes over the last 3 or 4 month. Most recent A1c is down to 7.5 previously 8.4. Vascular status is intact with pedal pulses palpable DP plus one PT plus one over 4 bilateral refill timed 3-4 seconds all digits epicritic and proprioceptive sensations are intact although diminished patient is a history of neuropathy taking gabapentin as instructed.. 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 No open wounds no ulcers no secondary infections nails somewhat brittle crumbly criptotic patient is doing self-care appropriately. 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 Will be followed up properly once authorization for shoes obtained through her primary caregiver and her diabetes doctor. Follow-up in 3 months  Harriet Masson DPM

## 2014-10-10 ENCOUNTER — Ambulatory Visit: Payer: Self-pay | Admitting: Internal Medicine

## 2014-10-16 ENCOUNTER — Ambulatory Visit (INDEPENDENT_AMBULATORY_CARE_PROVIDER_SITE_OTHER): Payer: Medicare Other | Admitting: Internal Medicine

## 2014-10-16 ENCOUNTER — Encounter: Payer: Self-pay | Admitting: Internal Medicine

## 2014-10-16 VITALS — BP 148/86 | HR 83 | Ht 62.0 in | Wt 195.6 lb

## 2014-10-16 DIAGNOSIS — G4733 Obstructive sleep apnea (adult) (pediatric): Secondary | ICD-10-CM

## 2014-10-16 NOTE — Progress Notes (Signed)
11/11/12- 83 yoF former smoker Dr Roma Schanz study attached to consult sheet; falls asleep not meaning to and stops breathing as told by her sister; had to have O2 bled through CPAP after recent surgery-no longer has O2. Also states that she feels like her O2 levels drop when she walks. CPAP Advanced, 8 CWP.less sleepy with CPAP. NPSG 07/07/12- AHI 14.3 per hour. Body weight 177 pounds. Mild obstructive sleep apnea/hypoxia syndrome. CPAP titrated to 8. Bedtime between 9 and 10 PM, sleep latency 15 minutes, waking twice before up at [facility] for umbilical hernia repair. At that time desaturating and oxygen was added through her CPAP. Medical history of hypertension, followed by cardiology with atherosclerosis seen on CT scan. She denies MI or stroke. Diabetic with gastroparesis. Abdominal distention crowds breathing lying supine. No history of lung disease. Long history of second hand smoke. She quit smoking herself at age 78 but husband died of lung cancer.  12-31-12- 46 yo F former smoker followed for OSA, dyspnea Daughter here  On oxygen now. Being evaluated for pancreatitis. She felt "just tired" with some cough and wheeze. Dyspnea on exertion walking 1/2 mile. Wakes in the morning with dry cough. Chest tightness without pain but sometimes with wheeze. Continues CPAP 8/Advanced, all night, every night. 6MWT 2012-12-31-93%, 96%, 97%, 495 m. Noted some chest tightness while walking, resolved with rest. No oxygen limitation. PFT December 31, 2012-normal spirometry airflow, normal lung volumes, normal diffusion. Small airway flows improved with bronchodilator. FVC 2.54/85%, FEV1 2.16/97%, FEV1/FVC 0.85, FEF 25-75% 2.79/105%, TLC 95%, DLCO 88%. ONOX on CPAP 8/ Room Air 11/21/12- significant desaturation- less than or equal to 88% for over one hour during the night. CXR 11/16/12- IMPRESSION:  No acute cardiopulmonary abnormality.  Original Report Authenticated By: Roselyn Reef, M.D.  04/05/13- 62 yo F former  smoker followed for OSA, dyspnea, OHS    Sister here Short of breath started mid-may, coming and going, but worsened in June,hoarseness, worsened, coughing, no production except in the mornings, greenish yellow.  nasal canula with cpap 8/ Advanced and O2 2L - wearing it every night, no problems with pressure.  Hoarseness and dyspnea, and go. On Dexilant for known reflux and peptic ulcer. Discussed relation to hoarseness. Uses rescue inhaler at least once daily, but not wheezing.  05/02/13- 41 yo F former smoker followed for OSA, dyspnea, OHS    Dtr here here ACUTE VISIT: spoke with CY over wkend; told to call if no better today; continues to have SOB and CP in center upper and under breast area. Dry cough at night. Using [facility] and Pepcid. CPAP 8/ O2 2L/Advanced. Will short of breath and put oxygen on without CPAP dyspnea persisted. No sore throat or fever feels worse with cough and wheeze. Orthopnea 3 pillows but feels better. Heavy sensation upper anterior chest, not pain, persistent. Was taken off diuretic in January because she didn't seem to need it, and has felt bloated with more rapid pulse. Weight gain 6 lbs since Feb. Denies history of MI or blood clot. Started tetracycline 04/26/13. Started prednisone taper 7/27.   08/10/13- 28 yo F former smoker followed for OSA, dyspnea, OHS    Dtr here, wearing portable oxygen FOLLOWS FOR: has noticed she wheezes alot when moving around. Review 6MW test with patient. CPAP 8/ O2 2L/ Advanced Weight up 10 lbs in past month. Blames flu vaccine October 30 for vague malaise.we reviewed labs, all okay. Dr Edrick Oh started [facility]- she likes. Discussed pneumonia vaccine. 6 MWT 05/16/13- 95%, 94%,  97%, 474 m. Some nonspecific chest tightness relieved by rest. No oxygen limitation on this exercise. CXR 05/02/13  Image reviewed IMPRESSION:  No acute cardiopulmonary disease identified.  Original Report Authenticated By: Abelardo Diesel, M.D.  10/16/14- 34 yo F former  smoker followed for OSA, dyspnea, OHS    Dtr here, wearing portable oxygen FOLLOWS FOR: Wears CPAP 8/ [facility].  Having congestion in sinus area. Very pleased with CPAP "great" sleeping with it all night every night. She stopped using oxygen with CPAP 2 weeks ago saying it made her lightheaded and gave her headaches. Sinuses congested-Sudafed PE has been little help. No discharge  Little chest discomfort is blamed on cold air with no routine cough or phlegm.  CXR 10/01/14   reviewed IMPRESSION: No active cardiopulmonary disease. Electronically Signed  By: Shon Hale M.D.  On: 10/01/2014 14:02  ROS-see HPI Constitutional:   No-   weight loss, night sweats, fevers, chills, fatigue, lassitude. HEENT:   + headaches, no-difficulty swallowing, tooth/dental problems, sore throat,       No-  sneezing, itching, ear ache, +nasal congestion, post nasal drip,  CV:  No-   chest pain, orthopnea, PND, swelling in lower extremities, anasarca, dizziness, palpitations Resp: + shortness of breath with exertion or at rest.              No-   productive cough,   non-productive cough,  No- coughing up of blood.              No-   change in color of mucus.  + wheezing.   Skin: No-   rash or lesions. GI:  + heartburn, no-indigestion, abdominal pain, nausea, vomiting,  GU: . MS:  No-   joint pain or swelling.   Neuro-     nothing unusual Psych:  No- change in mood or affect. + depression or anxiety.  No memory loss.  OBJ- Physical Exam General- Alert, Oriented, Affect-appropriate, Distress- none acute. Overweight Skin- rash-none, lesions- none, excoriation- none Lymphadenopathy- none Head- atraumatic            Eyes- Gross vision intact, PERRLA, conjunctivae and secretions clear            Ears- Hearing, canals-normal            Nose- Clear, no-Septal dev, mucus, polyps, erosion, perforation             Throat- Mallampati III , mucosa clear , drainage- none, tonsils- atrophic. +Upper denture, lower                           missing teeth. Neck- flexible , trachea midline, no stridor , thyroid nl, carotid no bruit Chest - symmetrical excursion , unlabored           Heart/CV- RRR , no murmur , no gallop  , no rub, nl s1 s2                           - JVD- none , edema- none, stasis changes- none, varices- none           Lung- clear to P&A, wheeze- none, cough- none , dullness-none, rub- none           Chest wall-  Abd-  Br/ Gen/ Rectal- Not done, not indicated Extrem-  Neuro- grossly intact to observation

## 2014-10-16 NOTE — Assessment & Plan Note (Signed)
Good compliance and control with CPAP pressure of 8 She stopped oxygen with CPAP 2 weeks ago, blaming it for headaches and lightheadedness which may not have really been related. Oxygen saturation at rest and previously was 6 minute walk test looked good. Plan-continue CPAP pressure at 8. Schedule overnight oximetry on room air with CPAP

## 2014-10-16 NOTE — Patient Instructions (Signed)
Order- DME Advanced   ONOX on CPAP room air  Try using otc saline nasal spray (AYR, NeilMed and other brands are fine) for stuffy nose.  Please call as needed

## 2014-10-30 ENCOUNTER — Ambulatory Visit (INDEPENDENT_AMBULATORY_CARE_PROVIDER_SITE_OTHER): Payer: Medicare Other | Admitting: Neurology

## 2014-10-30 ENCOUNTER — Encounter: Payer: Self-pay | Admitting: Neurology

## 2014-10-30 VITALS — BP 156/93 | HR 88 | Ht 62.0 in | Wt 196.0 lb

## 2014-10-30 DIAGNOSIS — E1159 Type 2 diabetes mellitus with other circulatory complications: Secondary | ICD-10-CM

## 2014-10-30 DIAGNOSIS — R200 Anesthesia of skin: Secondary | ICD-10-CM

## 2014-10-30 DIAGNOSIS — R51 Headache: Secondary | ICD-10-CM

## 2014-10-30 DIAGNOSIS — E785 Hyperlipidemia, unspecified: Secondary | ICD-10-CM

## 2014-10-30 DIAGNOSIS — E669 Obesity, unspecified: Secondary | ICD-10-CM | POA: Insufficient documentation

## 2014-10-30 DIAGNOSIS — I6330 Cerebral infarction due to thrombosis of unspecified cerebral artery: Secondary | ICD-10-CM

## 2014-10-30 DIAGNOSIS — R519 Headache, unspecified: Secondary | ICD-10-CM | POA: Insufficient documentation

## 2014-10-30 DIAGNOSIS — I10 Essential (primary) hypertension: Secondary | ICD-10-CM

## 2014-10-30 DIAGNOSIS — Z9989 Dependence on other enabling machines and devices: Secondary | ICD-10-CM

## 2014-10-30 DIAGNOSIS — G4733 Obstructive sleep apnea (adult) (pediatric): Secondary | ICD-10-CM | POA: Insufficient documentation

## 2014-10-30 MED ORDER — CLOPIDOGREL BISULFATE 75 MG PO TABS: 75.0000 mg | ORAL_TABLET | Freq: Every day | ORAL | Status: DC

## 2014-10-30 MED ORDER — NORTRIPTYLINE HCL 10 MG PO CAPS: 10.0000 mg | ORAL_CAPSULE | Freq: Every day | ORAL | Status: DC

## 2014-10-30 MED ORDER — NORTRIPTYLINE HCL 25 MG PO CAPS: 25.0000 mg | ORAL_CAPSULE | Freq: Every day | ORAL | Status: DC

## 2014-10-30 MED ORDER — NORTRIPTYLINE HCL 50 MG PO CAPS: 50.0000 mg | ORAL_CAPSULE | Freq: Every day | ORAL | Status: DC

## 2014-10-30 NOTE — Patient Instructions (Signed)
-   switch from ASA to plavix for stroke prevention - continue pravastatin, niacin and tripilix for HLD and stroke prevention - check BP and glucose at home - Follow up with your primary care physician for stroke risk factor modification. Recommend maintain blood pressure goal <130/80, diabetes with hemoglobin A1c goal below 6.5% and lipids with LDL cholesterol goal below 70 mg/dL.  - compliance with CPAP - will do EEG to rule out seizure - will start the medication called nortriptyline tiatriting dose for possible complicated migraine. - follow up in one month.

## 2014-10-30 NOTE — Progress Notes (Signed)
NEUROLOGY CLINIC NEW PATIENT NOTE  NAME: Destiny Robinson DOB: [birth year] REFERRING PHYSICIAN: Dione Housekeeper, MD  I saw [name] as a new consult in the neurovascular clinic today regarding  Chief Complaint  Patient presents with  . Follow-up    Hospital  . Cerebrovascular Accident    Stroke  .  HPI: Destiny Robinson is a 59 y.o. female with PMH of hypertension, diabetes, diabetic gastroparesis, hyperlipidemia, obesity, OSA on CPAP who presents as a new patient for episodic left-sided weakness.   Patient stated that since October last year she started to have this episodic left-sided numbness, involving left face, arm and leg. It happens 4-5 times per months for October, November and December. Most time it happens one patient was sitting still. The numbness happens acutely, lasting about 30 minutes and resolved spontaneously. Starting in December, her episode was followed by headache, the headache is more bilateral frontal temporal and occipital, 8-9/10, she has to take 2 x 500mg  Tylenol for the headache and take a nap for 2 hours. After the nap headache was gone. Blurry vision was associated with headache, denies nausea vomiting, photophobia phonophobia. After each episode she will feeding generalized weakness for a while and recovered. On 10/01/2014, she had again left-sided numbness, however, it did not resolve within 30 minutes. After 2 hours, she presented herself to the ER for further evaluation. CAT scan of the head showed no acute abnormality, but bilateral basal ganglia lacunar infarcts. MRI brain confirmed no acute infarcts but bilateral basal ganglia lacunar infarcts.  Her other stroke workup negative, except high LDL and TG. The numbness lasted about 12 hours and then subsided. She was put on aspirin 81 and discharged with outpatient follow-up. After discharge, her episode was initially subsided, however for the last 3 days, she had again 5 times of left-sided numbness episodes  followed by headache. She came in today for further evaluation.  She had a history of hyperlipidemia, follow-up with her PCP [name] after discharge, and was put on pravastatin 80 mg, fenofibrate, and niacin, and was told her TG and LDL were much better. She had allergic reaction to Lipitor, and not able to tolerate with Crestor or simvastatin.  She had a history of hypertension on HCTZ, losartan, and metoprolol. She stated her blood pressure at home around 135/80, but today in clinic 156/93. She has a history of diabetes, diabetic neuropathy, diabetic gastroparesis, on insulin injection, metformin and glipizide. She said her sugar was between 80-90 at home, her A1c was 6.6 in December.  She denies any history of migraine, however her sister and her mom have migraine. She denies any visual aura with headache, but admits to headache associated with the blurry vision.  She had a history of neuropathy, has left lower extremity below knee numbness before the admission, constant and 2 now. She is obese, former smoker but quit at age of 36. She had OSA on CPAP, has been followed with pulmonology.  She denies any alcohol drinking, or illicit drug use.   Past Medical History  Diagnosis Date  . Hypertension   . Hypercholesterolemia   . Bronchitis   . Pancreatitis   . Complication of anesthesia 12/11/11    "angry, mean, hateful after" endoscopy & colonoscopy  . Asthma ~ 1991    "come and go"  . Pneumonia 06/2011; 07/2011  . Diabetes mellitus type 2, controlled   . GERD (gastroesophageal reflux disease)   . GI bleeding 12/21/11    ""that's why  I'm here"  . Hx of colonoscopy with polypectomy 12/11/11    "took out 7; couldn't get #8, that one was precancerous"  . Depression   . 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 Paralysis gastric   . Kidney stones    Past Surgical History  Procedure Laterality Date  . Cesarean section  1981; 1986  . Esophagogastroduodenoscopy   12/11/2011    Procedure: ESOPHAGOGASTRODUODENOSCOPY (EGD);  Surgeon: Missy Sabins, MD;  Location: [facility] ENDOSCOPY;  Service: Endoscopy;  Laterality: N/A;  . Colonoscopy  12/11/2011    Procedure: COLONOSCOPY;  Surgeon: Missy Sabins, MD;  Location: [facility];  Service: Endoscopy;  Laterality: N/A;  . Abdominal hysterectomy  2002  . Cholecystectomy  ~ 2002  . Tonsillectomy and adenoidectomy  1988  . Appendectomy  ~ 2002    "w/hysterectomy"  . Colonoscopy  04/26/2012    Procedure: COLONOSCOPY;  Surgeon: Wonda Horner, MD;  Location: [facility] ENDOSCOPY;  Service: Endoscopy;  Laterality: N/A;  apc  . Hernia repair     Family History  Problem Relation Age of Onset  . Colon cancer Sister   . Alzheimer's disease Mother   . Heart disease Father    Current Outpatient Prescriptions  Medication Sig Dispense Refill  . acetaminophen (TYLENOL) 500 MG tablet Take 1,000 mg by mouth daily as needed for fever or headache (headache).     Marland Kitchen albuterol (PROVENTIL) (2.5 MG/3ML) 0.083% nebulizer solution Take 2.5 mg by nebulization 4 (four) times daily as needed for wheezing.    . calcium carbonate (OS-CAL) 600 MG TABS tablet Take 600 mg by mouth daily.    . Cholecalciferol (VITAMIN D PO) Take 1 tablet by mouth daily.    . Choline Fenofibrate (FENOFIBRIC ACID) 135 MG CPDR Take 135 mg by mouth at bedtime.    . Coenzyme Q10 (CO Q-10) 100 MG CAPS Take 100 mg by mouth at bedtime.    . dicyclomine (BENTYL) 10 MG capsule Take 10 mg by mouth 4 (four) times daily -  before meals and at bedtime.    . gabapentin (NEURONTIN) 300 MG capsule TAKE (2) CAPSULES THREE TIMES DAILY. 9AM, 3PM, 9PM 180 capsule 2  . glipiZIDE (GLUCOTROL) 10 MG tablet Take 10 mg by mouth daily before breakfast.    . hydrochlorothiazide (HYDRODIURIL) 25 MG tablet Take 25 mg by mouth daily.    . Insulin NPH Human, Isophane, (NOVOLIN N RELION Bee) Inject 44 Units into the skin at bedtime.    Marland Kitchen LORazepam (ATIVAN) 0.5 MG tablet Take 0.5 mg by mouth 4 (four) times  daily. FOR ANXIETY    . losartan (COZAAR) 100 MG tablet Take 100 mg by mouth daily.    . metFORMIN (GLUCOPHAGE) 1000 MG tablet Take 1,000 mg by mouth 2 (two) times daily with a meal.    . metoprolol tartrate (LOPRESSOR) 25 MG tablet Take 1 tablet (25 mg total) by mouth 2 (two) times daily. 60 tablet 0  . Multiple Vitamin (MULTIVITAMIN WITH MINERALS) TABS tablet Take 1 tablet by mouth daily.    . niacin 250 MG CR capsule Take 1 capsule (250 mg total) by mouth at bedtime. 30 capsule 0  . ondansetron (ZOFRAN) 4 MG tablet Take 4 mg by mouth daily as needed for nausea or vomiting.    . Pancrelipase, Lip-Prot-Amyl, (CREON) 24000 UNITS CPEP Take 1 capsule by mouth 2 (two) times daily.     . pantoprazole (PROTONIX) 40 MG tablet  Take 40 mg by mouth 2 (two) times daily.     . potassium chloride (K-DUR) 10 MEQ tablet Take 10 mEq by mouth every morning.    . pravastatin (PRAVACHOL) 80 MG tablet Take 80 mg by mouth daily.    Marland Kitchen PROAIR HFA 108 (90 BASE) MCG/ACT inhaler USE 2 PUFFS EVERY 6 HOURS AS NEEDED FOR WHEEZING 8.5 g 4  . Probiotic Product (ALIGN PO) Take 1 capsule by mouth daily.    . promethazine (PHENERGAN) 25 MG tablet Take 25 mg by mouth every 6 (six) hours as needed for nausea or vomiting.     . venlafaxine (EFFEXOR) 75 MG tablet Take 75 mg by mouth 3 (three) times daily.     . clopidogrel (PLAVIX) 75 MG tablet Take 1 tablet (75 mg total) by mouth daily. 90 tablet 3  . mometasone-formoterol (DULERA) 100-5 MCG/ACT AERO 2 puffs then rinse mouth, twice daily (Patient not taking: Reported on 10/16/2014) 1 Inhaler prn  . nortriptyline (PAMELOR) 10 MG capsule Take 1 capsule (10 mg total) by mouth at bedtime. 7 capsule 0  . [START ON 11/06/2014] nortriptyline (PAMELOR) 25 MG capsule Take 1 capsule (25 mg total) by mouth at bedtime. 7 capsule 0  . [START ON 11/13/2014] nortriptyline (PAMELOR) 50 MG capsule Take 1 capsule (50 mg total) by mouth at bedtime. 30 capsule 3  . [DISCONTINUED] metoCLOPramide (REGLAN) 10  MG tablet Take 1 tablet (10 mg total) by mouth 4 (four) times daily. Take as needed for nausea or vomiting 30 tablet 0   Current Facility-Administered Medications  Medication Dose Route Frequency Provider Last Rate Last Dose  . triamcinolone acetonide (KENALOG) 10 MG/ML injection 10 mg  10 mg Intramuscular Once Harriet Masson, DPM       Allergies  Allergen Reactions  . Codeine Hypertension  . Penicillins Anaphylaxis  . Reglan [Metoclopramide] Swelling and Rash    5MG  MAKES THROAT SWELL  . Xifaxan [Rifaximin] Hives, Shortness Of Breath and Swelling    SOB 550MG  MAKES THROAT SWELL SOB  . Zocor [Simvastatin] Anaphylaxis and Hives  . Crestor [Rosuvastatin] Other (See Comments)    Muscles ache   History   Social History  . Marital Status: Widowed    Spouse Name: N/A    Number of Children: N/A  . Years of Education: N/A   Occupational History  . Not on file.   Social History Main Topics  . Smoking status: Former Smoker -- 0.12 packs/day for 6 years    Types: Cigarettes    Quit date: 12/09/1996  . Smokeless tobacco: Never Used  . Alcohol Use: No  . Drug Use: No  . Sexual Activity: No     Comment: complete hysterectomy   Other Topics Concern  . Not on file   Social History Narrative    Review of Systems Full 14 system review of systems performed and notable only for those listed, all others are neg:  Constitutional:   chills, fatigue, excessive sweating Cardiovascular: N/A  Ear/Nose/Throat: N/A  Skin: N/A  Eyes: blurry vision   Respiratory: shortness breath   Gastroitestinal: rectal bleeding, constipation, diarrhea, nausea, vomiting   Hematology/Lymphatic: N/A  Endocrine: excessive thirst   Musculoskeletal: joint pain, joint swelling, back pain, aching muscles, muscles cramps, sometimes walking difficulty   Allergy/Immunology: N/A  Neurological: Dizziness, headache, numbness, weakness   Psychiatric: agitation, depression Sleep: Apnea, frequent waking, daytime  sleepiness    Physical Exam  Filed Vitals:   10/30/14 1052  BP: 156/93  Pulse: 88  General - Well nourished, well developed, in no apparent distress.  Ophthalmologic - Sharp disc margins OU.  Cardiovascular - Regular rate and rhythm with no murmur. Carotid pulses were 2+ without bruits .   Neck - supple, no nuchal rigidity .  Mental Status -  Level of arousal and orientation to time, place, and person were intact. Language including expression, naming, repetition, comprehension was assessed and found intact. Attention span and concentration were normal. Recent and remote memory were intact. Fund of Knowledge was assessed and was intact.  Cranial Nerves II - XII - II - Visual field intact OU. III, IV, VI - Extraocular movements intact. V - Facial sensation intact bilaterally. VII - Facial movement intact bilaterally. VIII - Hearing & vestibular intact bilaterally. X - Palate elevates symmetrically. XI - Chin turning & shoulder shrug intact bilaterally. XII - Tongue protrusion intact.  Motor Strength - The patient's strength was normal in all extremities and pronator drift was absent.  Bulk was normal and fasciculations were absent.   Motor Tone - Muscle tone was assessed at the neck and appendages and was normal.  Reflexes - The patient's reflexes were normal in all extremities and she had no pathological reflexes.  Sensory - Light touch, temperature/pinprick mildly decreased on the left upper extremity, normal on left lower extremity above the knee, but decreased on the left lower extremity below the knee.   Coordination - The patient had normal movements in the hands and feet with no ataxia or dysmetria.  Tremor was absent.  Gait and Station - The patient's transfers, posture, gait, station, and turns were observed as normal.   Imaging CT head 10/01/14 -  1. Age-indeterminate lacunar infarcts in the right and left basal ganglia. 2. Ectasia of the basilar artery,  described previously.  MRI and MRA head and neck 10/01/14 Negative for acute infarct. Moderately severe chronic microvascular ischemic changes. Moderately severe chronic micro hemorrhage in the brain. 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 No focal intracranial stenosis  CUS - Bilateral: 1-39% ICA stenosis. Vertebral artery flow is antegrade.  2D echo - - Left ventricle: The cavity size was normal. Wall thickness was normal. Systolic function was mildly reduced. The estimated ejection fraction was in the range of 45% to 50%. Wall motion was normal; there were no regional wall motion abnormalities. Doppler parameters are consistent with abnormal left ventricular relaxation (grade 1 diastolic dysfunction). - Mitral valve: Calcified annulus. Mildly thickened leaflets . - Pulmonary arteries: PA peak pressure: 38 mm Hg (S).  Lab Review  Component     Latest Ref Rng 10/02/2014  Cholesterol     0 - 200 mg/dL 259 (H)  Triglycerides     <150 mg/dL 523 (H)  HDL     >39 mg/dL 33 (L)  Total CHOL/HDL Ratio      7.8  VLDL     0 - 40 mg/dL UNABLE TO CALCULATE IF TRIGLYCERIDE OVER 400 mg/dL  LDL (calc)     0 - 99 mg/dL UNABLE TO CALCULATE IF TRIGLYCERIDE OVER 400 mg/dL  Hgb A1c MFr Bld     <5.7 % 6.6 (H)  Mean Plasma Glucose     <117 mg/dL 143 (H)     Assessment and Plan:   In summary, Destiny Robinson is a 59 y.o. female with PMH of hypertension, diabetes, diabetic gastroparesis, hyperlipidemia, obesity, OSA on CPAP who presents as a new patient for episodic left-sided weakness. She has frequent  left-sided numbness episode since last October and MRI was negative for acute stroke. The frequency of the symptoms does not consistent with a TIA episode, however she does have multiple stroke risk factors including chronic lacunar stroke, hypertension, hyperlipidemia, diabetes, OSA, obesity. Will change her aspirin to  Plavix and continue statin for stroke prevention. Seizure and a complicated migraine also in the differentials. Will do EEG to rule out seizure. Lately, her episodes were followed by headache, we'll consider to start migraine headache prophylaxis to see if this will decrease the frequency of the episodes.  - Switch from aspirin 81 to Plavix 75 daily for stroke prevention - Continue pravastatin for stroke prevention - Also continue niacin and fenofibrate for hyperlipidemia - Check blood pressure and glucose at home - Follow up with your primary care physician for stroke risk factor modification. Recommend maintain blood pressure goal <130/80, diabetes with hemoglobin A1c goal below 6.5% and lipids with LDL cholesterol goal below 70 mg/dL.  - EEG to rule out seizure - Start titrating dose of nortriptyline for migraine prophylaxis   - Continue CPAP for OSA treatment - RTC in 1 month  I recommend aggressive blood pressure control with a goal <130/80 mm Hg.  Lipids should be managed intensively, with a goal LDL < 70 mg/dL.  I encouraged the patient to discuss these important issues with her primary care physician.  I counseled the patient on measures to reduce stroke risk, including the importance of medication compliance, risk factor control, exercise, healthy diet, and avoidance of smoking.  I reviewed stroke warning signs and symptoms and appropriate actions to take if such occurs.   Thank you very much for the opportunity to participate in the care of this patient.  Please do not hesitate to call if any questions or concerns arise.  Orders Placed This Encounter  Procedures  . EEG adult    Standing Status: Future     Number of Occurrences:      Standing Expiration Date: 10/31/2015    Meds ordered this encounter  Medications  . clopidogrel (PLAVIX) 75 MG tablet    Sig: Take 1 tablet (75 mg total) by mouth daily.    Dispense:  90 tablet    Refill:  3  . nortriptyline (PAMELOR) 10 MG capsule      Sig: Take 1 capsule (10 mg total) by mouth at bedtime.    Dispense:  7 capsule    Refill:  0  . nortriptyline (PAMELOR) 25 MG capsule    Sig: Take 1 capsule (25 mg total) by mouth at bedtime.    Dispense:  7 capsule    Refill:  0  . nortriptyline (PAMELOR) 50 MG capsule    Sig: Take 1 capsule (50 mg total) by mouth at bedtime.    Dispense:  30 capsule    Refill:  3    Patient Instructions  - switch from ASA to plavix for stroke prevention - continue pravastatin, niacin and tripilix for HLD and stroke prevention - check BP and glucose at home - Follow up with your primary care physician for stroke risk factor modification. Recommend maintain blood pressure goal <130/80, diabetes with hemoglobin A1c goal below 6.5% and lipids with LDL cholesterol goal below 70 mg/dL.  - compliance with CPAP - will do EEG to rule out seizure - will start the medication called nortriptyline tiatriting dose for possible complicated migraine. - follow up in one month.   Rosalin Hawking, MD PhD [facility] [address]  [place], [place] [address] [phone]

## 2014-10-31 ENCOUNTER — Telehealth: Payer: Self-pay | Admitting: Internal Medicine

## 2014-10-31 DIAGNOSIS — G4733 Obstructive sleep apnea (adult) (pediatric): Secondary | ICD-10-CM

## 2014-10-31 NOTE — Telephone Encounter (Signed)
Called spoke with pt. She reports her ONO was picked up on 10/25/14. She reports [facility] did this. She is requesting results. Please advise Dr. Annamaria Boots if [facility]yu have received them? thanks

## 2014-11-01 ENCOUNTER — Ambulatory Visit (INDEPENDENT_AMBULATORY_CARE_PROVIDER_SITE_OTHER): Payer: Medicare Other | Admitting: Neurology

## 2014-11-01 ENCOUNTER — Ambulatory Visit: Payer: Medicare Other | Admitting: *Deleted

## 2014-11-01 DIAGNOSIS — G629 Polyneuropathy, unspecified: Secondary | ICD-10-CM

## 2014-11-01 DIAGNOSIS — R200 Anesthesia of skin: Secondary | ICD-10-CM

## 2014-11-01 NOTE — Progress Notes (Signed)
BEING MEASURED AND PICKING OUT MY SHOES  APEX BOSS RUNNER [id number] WOMEN'S 9.5 WIDE

## 2014-11-01 NOTE — Telephone Encounter (Signed)
Called [facility], spoke with Pernice, requested another copy be faxed to triage. Once received, will give to CY to review and give information for pt.

## 2014-11-01 NOTE — Patient Instructions (Signed)
YOU WILL RECEIVE A POST CARD IN THE MAIL WHEN YOUR SHOES ARRIVE TELLING YOU TO SCHEDULE AN APPOINTMENT WITH DR Blenda Mounts TO PICK THEM UP

## 2014-11-01 NOTE — Telephone Encounter (Signed)
It was sent downstairs to be scanned. If they don't have it, please request another copy from [facility].

## 2014-11-02 NOTE — Telephone Encounter (Signed)
I called patient-she is aware that I have spoken with CY and he wants to get a DL from [facility] to check for pressure compliance. Pt states she already has O2 concentrator at home but has not used it in about 1 month as it gives her headaches and is paying out of pocket each month for this. Pt states she wears her CPAP every night all night as she should and [facility] confirmed the most current DL they have is from 2014. Pt aware that I will check with CY and get back with her on what to do next.    Spoke with CY and he states to have patient continue therapy as she is now and wait for CPAP download report from [facility].  I have attempted to call patient and had to leave a message regarding above. I have went ahead and requested [facility] get DL for CY to review. Will hold in my in-basket to follow up on.

## 2014-11-02 NOTE — Telephone Encounter (Signed)
Pt returned call. [phone]

## 2014-11-02 NOTE — Procedures (Signed)
   HISTORY: 59 year old female, presenting with episode of left-sided weakness, and left-sided numbness, since October 2015  TECHNIQUE:  16 channel EEG was performed based on standard 10-16 international system. One channel was dedicated to EKG, which has demonstrates normal sinus rhythm of 96 beats per minutes.  The tracing was obscured by frequent muscle artifact, electrode artifact. Upon awakening, the posterior background activity was dysarrhythmic, with mixed alpha and theta range activity, reactive to eye opening and closure.  There was no evidence of epileptiform discharge.  Photic stimulation was performed, which induced a symmetric photic driving.  Hyperventilation was  not performed  No sleep was achieved.  CONCLUSION: This is a  technically difficult EEG, the tracing was obscured by frequent muscle, and electrode activities.  There is no electrodiagnostic evidence of epileptiform discharge, there is no asymmetry noticed.Marland Kitchen

## 2014-11-02 NOTE — Telephone Encounter (Signed)
D/L received. Placed on CY cart to be reviewed.

## 2014-11-02 NOTE — Telephone Encounter (Signed)
See my response. Maybe Joellen Jersey should call patient on this.

## 2014-11-02 NOTE — Telephone Encounter (Signed)
She had normal PFT in 2014, and normal 6 MWT, so I don't know why she needs the oxygen if CPAP is being worn and set correctly. Her ONOX qualifies for continued use of O2 2L for sleep, but Please order CPAP download for pressure compliance.  Dx OSA with hypoxia

## 2014-11-05 ENCOUNTER — Telehealth: Payer: Self-pay | Admitting: *Deleted

## 2014-11-05 NOTE — Progress Notes (Signed)
Quick Note:  Called and spoke to patient relayed EEG was negative for seizure keep medication management and follow up apt. Patient understood. ______

## 2014-11-05 NOTE — Telephone Encounter (Signed)
Pt returned call Advised of CY's recs to continue therapy as pt is now and wait for CPAP download from [facility] Pt concerned that this has not yet been received - advised pt that I am unsure on the status of this but would forward her concern to Joellen Jersey to give her a call back.  Please advise, thank you.

## 2014-11-05 NOTE — Telephone Encounter (Signed)
Patient calling wanting a letter stating that her EEG showed no seizure so that she can drive. Patient can be reached at [phone]

## 2014-11-05 NOTE — Telephone Encounter (Signed)
Spoke with patient-she is aware that I sent order to [facility] asking they get a DL for [place] ASAP. Pt is aware and understands [facility] will contact her to get DL. Pt will have to go to the [facility] branch office to do this.

## 2014-11-05 NOTE — Telephone Encounter (Signed)
Dr. Erlinda Hong patient wants to know if you can release her EEG results to my chart.

## 2014-11-06 NOTE — Telephone Encounter (Signed)
Talked with pt over the phone. Informed her that EEG was negative for seizure. Although seizure can not be ruled out by single EEG, currently she does not have seizure diagnosis. She may have complicated migraine and she is going to start nortriptyline 25mg  tonight. She is driving now and I do not have driving restrictions for her. She stated that her brother do not want her to drive due to the episodes and I told her to talk with her brother. I also offered to talked to her brother if she would like me to do so. She will let me know.  Rosalin Hawking, MD PhD Stroke Neurology 11/06/2014 5:47 PM

## 2014-11-08 ENCOUNTER — Other Ambulatory Visit: Payer: Self-pay

## 2014-11-08 ENCOUNTER — Ambulatory Visit (INDEPENDENT_AMBULATORY_CARE_PROVIDER_SITE_OTHER): Payer: Medicare Other | Admitting: Cardiology

## 2014-11-08 ENCOUNTER — Encounter: Payer: Self-pay | Admitting: Cardiology

## 2014-11-08 VITALS — BP 160/90 | HR 100 | Ht 62.0 in | Wt 197.9 lb

## 2014-11-08 DIAGNOSIS — R0789 Other chest pain: Secondary | ICD-10-CM

## 2014-11-08 DIAGNOSIS — G629 Polyneuropathy, unspecified: Secondary | ICD-10-CM

## 2014-11-08 MED ORDER — AMLODIPINE BESYLATE 5 MG PO TABS: 5.0000 mg | ORAL_TABLET | Freq: Every day | ORAL | Status: DC

## 2014-11-08 NOTE — Progress Notes (Signed)
Cardiology Office Note   Date:  11/08/2014   ID:  Destiny Robinson, DOB 1956-07-27, MRN [id number]  PCP:  [name], MD  Cardiologist:   Minus Breeding, MD   No chief complaint on file.     History of Present Illness: Destiny Robinson is a 59 y.o. female who presents for evaluation of a reduced ejection fraction and chest pain.  I saw her in 2002. She had chest pain at that time but had normal coronaries on catheterization. She's had recently hospitalization for TIAs and she reports a stroke. I did review recent hospital records. Of note her ejection fraction in late December demonstrated an EF of 45-50% which was new compared to the normal EF several years ago. Of note she had no significant carotid stenosis. She presents for follow-up and says that she does get chest discomfort which she's been having for 3-4 weeks when she walks 1/2 mile between her brother and her sister's houses.  She has to stop what she is doing when she gets there and rest for about 15 minutes. Recover. She says it can be intense. It is midsternal squeezing. There is no radiation. She doesn't get this at rest. She does not have associated nausea vomiting or diaphoresis. She doesn't have palpitations, presyncope or syncope. She has no PND or orthopnea.    Past Medical History  Diagnosis Date  . Hypertension   . Hypercholesterolemia   . Bronchitis   . Pancreatitis   . Complication of anesthesia 12/11/11    "angry, mean, hateful after" endoscopy & colonoscopy  . Asthma ~ 1991    "come and go"  . Pneumonia 06/2011; 07/2011  . Diabetes mellitus type 2, controlled   . GERD (gastroesophageal reflux disease)   . GI bleeding 12/21/11    ""that's why I'm here"  . Hx of colonoscopy with polypectomy 12/11/11    "took out 7; couldn't get #8, that one was precancerous"  . Depression   . 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 Paralysis gastric   . Kidney stones     Past Surgical  History  Procedure Laterality Date  . Cesarean section  1981; 1986  . Esophagogastroduodenoscopy  12/11/2011    Procedure: ESOPHAGOGASTRODUODENOSCOPY (EGD);  Surgeon: Missy Sabins, MD;  Location: [facility] ENDOSCOPY;  Service: Endoscopy;  Laterality: N/A;  . Colonoscopy  12/11/2011    Procedure: COLONOSCOPY;  Surgeon: Missy Sabins, MD;  Location: [place];  Service: Endoscopy;  Laterality: N/A;  . Abdominal hysterectomy  2002  . Cholecystectomy  ~ 2002  . Tonsillectomy and adenoidectomy  1988  . Appendectomy  ~ 2002    "w/hysterectomy"  . Colonoscopy  04/26/2012    Procedure: COLONOSCOPY;  Surgeon: Wonda Horner, MD;  Location: [facility] ENDOSCOPY;  Service: Endoscopy;  Laterality: N/A;  apc  . Hernia repair       Current Outpatient Prescriptions  Medication Sig Dispense Refill  . acetaminophen (TYLENOL) 500 MG tablet Take 1,000 mg by mouth daily as needed for fever or headache (headache).     . calcium carbonate (OS-CAL) 600 MG TABS tablet Take 600 mg by mouth daily.    . Cholecalciferol (VITAMIN D PO) Take 1 tablet by mouth daily.    . Choline Fenofibrate (FENOFIBRIC ACID) 135 MG CPDR Take 135 mg by mouth at bedtime.    . clopidogrel (PLAVIX) 75 MG tablet Take 1 tablet (75 mg total) by mouth daily. [place]  tablet 3  . Coenzyme Q10 (CO Q-10) 100 MG CAPS Take 100 mg by mouth at bedtime.    . dicyclomine (BENTYL) 10 MG capsule Take 10 mg by mouth 4 (four) times daily -  before meals and at bedtime.    . gabapentin (NEURONTIN) 300 MG capsule TAKE (2) CAPSULES THREE TIMES DAILY. 9AM, 3PM, 9PM 180 capsule 2  . glipiZIDE (GLUCOTROL) 10 MG tablet Take 10 mg by mouth daily before breakfast.    . hydrochlorothiazide (HYDRODIURIL) 25 MG tablet Take 25 mg by mouth daily.    . Insulin NPH Human, Isophane, (NOVOLIN N RELION [place]) Inject 44 Units into the skin at bedtime.    Marland Kitchen LORazepam (ATIVAN) 0.5 MG tablet Take 0.5 mg by mouth 4 (four) times daily. FOR ANXIETY    . losartan (COZAAR) 100 MG tablet Take 100 mg by  mouth daily.    . metFORMIN (GLUCOPHAGE) 1000 MG tablet Take 1,000 mg by mouth 2 (two) times daily with a meal.    . metoprolol tartrate (LOPRESSOR) 25 MG tablet Take 1 tablet (25 mg total) by mouth 2 (two) times daily. 60 tablet 0  . Multiple Vitamin (MULTIVITAMIN WITH MINERALS) TABS tablet Take 1 tablet by mouth daily.    . niacin 250 MG CR capsule Take 1 capsule (250 mg total) by mouth at bedtime. 30 capsule 0  . nortriptyline (PAMELOR) 25 MG capsule Take 1 capsule (25 mg total) by mouth at bedtime. 7 capsule 0  . ondansetron (ZOFRAN) 4 MG tablet Take 4 mg by mouth daily as needed for nausea or vomiting.    . Pancrelipase, Lip-Prot-Amyl, (CREON) 24000 UNITS CPEP Take 1 capsule by mouth 2 (two) times daily.     . pantoprazole (PROTONIX) 40 MG tablet Take 40 mg by mouth 2 (two) times daily.     . potassium chloride (K-DUR) 10 MEQ tablet Take 10 mEq by mouth every morning.    . pravastatin (PRAVACHOL) 80 MG tablet Take 80 mg by mouth daily.    Marland Kitchen PROAIR HFA 108 (90 BASE) MCG/ACT inhaler USE 2 PUFFS EVERY 6 HOURS AS NEEDED FOR WHEEZING 8.5 g 4  . Probiotic Product (ALIGN PO) Take 1 capsule by mouth daily.    . promethazine (PHENERGAN) 25 MG tablet Take 25 mg by mouth every 6 (six) hours as needed for nausea or vomiting.     . venlafaxine (EFFEXOR) 75 MG tablet Take 75 mg by mouth 3 (three) times daily.     . [DISCONTINUED] metoCLOPramide (REGLAN) 10 MG tablet Take 1 tablet (10 mg total) by mouth 4 (four) times daily. Take as needed for nausea or vomiting 30 tablet 0   Current Facility-Administered Medications  Medication Dose Route Frequency Provider Last Rate Last Dose  . triamcinolone acetonide (KENALOG) 10 MG/ML injection 10 mg  10 mg Intramuscular Once Harriet Masson, DPM        Allergies:   Codeine; Penicillins; Reglan; Xifaxan; Zocor; and Crestor    Social History:  The patient  reports that she quit smoking about 17 years ago. Her smoking use included Cigarettes. She has a .72 pack-year  smoking history. She has never used smokeless tobacco. She reports that she does not drink alcohol or use illicit drugs.   Family History:  The patient's family history includes Alzheimer's disease in her mother; Colon cancer in her sister; Heart disease in her father.    ROS:  Please see the history of present illness.   Otherwise, review of systems are positive for  headaches, dizziness, reflux, swallowing, cramping, leg swelling..   All other systems are reviewed and negative.    PHYSICAL EXAM: VS:  BP 160/90 mmHg  Pulse 100  Ht 5\' 2"  (1.575 m)  Wt 197 lb 14.4 oz (89.767 kg)  BMI 36.19 kg/m2 , BMI Body mass index is 36.19 kg/(m^2). GEN: Well nourished, well developed, in no acute distress HEENT: normal Neck: no JVD, carotid bruits, or masses Cardiac: RRR;  brief apical systolic nonradiating murmur, no diastolic murmurs, rubs, or gallops,no edema  Respiratory:  clear to auscultation bilaterally, normal work of breathing GI: soft, nontender, nondistended, + BS MS: no deformity or atrophy Skin: warm and dry, no rash Neuro:  Strength and sensation are intact Psych: euthymic mood, full affect   EKG:  EKG is not ordered today. EKG  10/02/14 normal sinus rhythm, rate 92, axis within normal limits, intervals with mildly prolonged QT, no acute ST-T wave changes.    Recent Labs: 10/02/2014: ALT 24; Hemoglobin 13.5; Platelets 187 10/03/2014: BUN 16; Creatinine 0.53; Magnesium 2.0; Potassium 3.8; Sodium 137    Lipid Panel    Component Value Date/Time   CHOL 259* 10/02/2014 0415   TRIG 523* 10/02/2014 0415   HDL 33* 10/02/2014 0415   CHOLHDL 7.8 10/02/2014 0415   VLDL UNABLE TO CALCULATE IF TRIGLYCERIDE OVER 400 mg/dL 10/02/2014 0415   LDLCALC UNABLE TO CALCULATE IF TRIGLYCERIDE OVER 400 mg/dL 10/02/2014 0415      Wt Readings from Last 3 Encounters:  11/08/14 197 lb 14.4 oz (89.767 kg)  10/30/14 196 lb (88.905 kg)  10/16/14 195 lb 9.6 oz (88.724 kg)      Other studies  Reviewed: Additional studies/ records that were reviewed today include:  iew of the above records demonstrates: Hospital records as recorded elsewhere    ASSESSMENT AND PLAN:  CHEST PAIN:  There are some typical and atypical features. He has exertional pain. I'm going to start with stress perfusion testing to risk stratify. For now she will continue the meds as listed. They will be 1 changes listed below.   HTN:  I will restart Norvasc which was stopped for unclear reasons previously. She will keep a blood pressure diary.  DYSLIPIDEMIA:  I reviewed the hospital labs which include triglycerides of 523. She is on a statin. I will defer follow-up to [name], MD   Current medicines are reviewed at length with the patient today.  The patient does not have concerns regarding medicines.  The following changes have been made:  no change  Labs/ tests ordered today include: See above.  Lexiscan Myoview.  No orders of the defined types were placed in this encounter.     Disposition:   FU with me in 4  months   Signed, Minus Breeding, MD  11/08/2014 1:58 PM    [facility]

## 2014-11-08 NOTE — Patient Instructions (Addendum)
Your physician recommends that you schedule a follow-up appointment in:  4 months with Dr. Percival Robinson  We have ordered a stress test for you to get done  We are starting you on Norvasc 5 mg to be taken daily  Heart Disease Prevention Heart disease can lead to heart attacks and strokes. This is a leading cause of death. Heart disease can be inherited and can be caused from the lifestyle you lead. You can do a lot to keep your heart and blood vessels healthy.  WHAT SHOULD I DO EACH DAY TO KEEP MY HEART HEALTHY?  Do not smoke.  Follow a healthy eating plan as recommended by your caregiver or dietitian.  Be active for a total of 30 minutes most days. Ask your caregiver what activities are best for you.  Limit the amount of alcohol you drink.  Involve family and friends to help you with a healthy lifestyle. HOW DOES HEART DISEASE CAUSE HIGH BLOOD PRESSURE?  Narrowed blood vessels leave a smaller opening for blood to flow through. It is like turning on a garden hose and holding your thumb over the opening. The smaller opening makes the water shoot out with more pressure. In the same way, narrowed blood vessels can lead to high blood pressure. Other factors, such as kidney problems and being overweight, also can lead to high blood pressure.  If you have high blood pressure you may need to take blood pressure medicine every day. Some types of blood pressure medicine can also help keep your kidneys healthy.  Many people with diabetes also have high blood pressure. If you have heart, eye, or kidney problems from diabetes, high blood pressure can make them worse. HOW DO MY BLOOD VESSELS GET CLOGGED?  Cholesterol is a substance that is made by the body and used for many important functions. It is also found in food that comes from animals. When your cholesterol is high, it can stick to the insides of your blood vessels, making them narrowed and even clogged. This problem is called  atherosclerosis.  Narrowed and clogged blood vessels make it harder for blood to get to important body organs. This can cause problems such as:  Chest pain (angina). Angina can cause temporary pain in your chest, arms, shoulders, or back. You may feel the pain more when your heart beats faster, such as when you exercise. The pain may go away when you rest. You also may feel very weak and sweaty.  A heart attack. A heart attack happens when a blood vessel in or near the heart becomes blocked. Not enough blood is getting to the heart. During a heart attack, you may have chest pain in your chest, arms, shoulders, or back along with nausea, indigestion, extreme weakness, and sweating. WHAT CAN I DO TO PREVENT HEART DISEASE?   Keep your blood pressure under control as recommended by your caregiver.  Keep your cholesterol under control. Have it checked at least once a year. Target cholesterol levels for most people are:  Total blood cholesterol level: Below 200.  LDL (bad) cholesterol: Below 100.  HDL (good) cholesterol: Above 40 in men and above 50 in women.  Triglycerides (another type of fat in the blood): Below 150.  Make physical activity a part of your daily routine. Check with your caregiver to learn what activities are best for you.  Make sure that the foods you eat are "heart-healthy."  Include foods high in fiber, such as oat bran, oatmeal, whole-grain breads and cereals.  Cut back on fried foods and foods high in saturated fat. This includes foods such as meats, butter, whole dairy products, shortening, and coconut or palm oil.  Avoid salty foods such as canned food, luncheon meat, salty snacks, and fast food.  Eat more fruits and vegetables.  Drink less alcohol.  Lose weight as recommended by your caregiver.  If you smoke, quit. Your caregiver can help you with quitting options.  Ask your caregiver whether you should take a daily aspirin. Studies have shown that taking  aspirin can help reduce your risk of heart disease and stroke.  Take your prescribed medicines as directed. WHAT ARE THE WARNING SIGNS OF A HEART ATTACK? You may have one or more of the following warning signs:  Chest pain or discomfort.  Pain or discomfort in your arms, back, jaw, or neck.  Indigestion or stomach pain.  Shortness of breath.  Sweating.  Nausea or vomiting.  Lightheadedness.  No warning signs at all or they may come and go. FOR MORE INFORMATION  To find out more about heart disease and stroke prevention, visit the American Heart Association website at www.americanheart.org Document Released: 05/05/2004 Document Revised: 03/22/2012 Document Reviewed: 11/15/2013 [facility] Patient Information 2015 [place], [place]. This information is not intended to replace advice given to you by your health care provider. Make sure you discuss any questions you have with your health care provider. Ischemic Stroke A stroke (cerebrovascular accident) is the sudden death of brain tissue. It is a medical emergency. A stroke can cause permanent loss of brain function. This can cause problems with different parts of your body. A transient ischemic attack (TIA) is different because it does not cause permanent damage. A TIA is a short-lived problem of poor blood flow affecting a part of the brain. A TIA is also a serious problem because having a TIA greatly increases the chances of having a stroke. When symptoms first develop, you cannot know if the problem might be a stroke or a TIA. CAUSES  A stroke is caused by a decrease of oxygen supply to an area of your brain. It is usually the result of a small blood clot or collection of cholesterol or fat (plaque) that blocks blood flow in the brain. A stroke can also be caused by blocked or damaged carotid arteries.  RISK FACTORS  High blood pressure (hypertension).  High cholesterol.  Diabetes mellitus.  Heart disease.  The buildup of plaque in  the blood vessels (peripheral artery disease or atherosclerosis).  The buildup of plaque in the blood vessels providing blood and oxygen to the brain (carotid artery stenosis).  An abnormal heart rhythm (atrial fibrillation).  Obesity.  Smoking.  Taking oral contraceptives (especially in combination with smoking).  Physical inactivity.  A diet high in fats, salt (sodium), and calories.  Alcohol use.  Use of illegal drugs (especially cocaine and methamphetamine).  Being African American.  Being over the age of 53.  Family history of stroke.  Previous history of blood clots, stroke, TIA, or heart attack.  Sickle cell disease. SYMPTOMS  These symptoms usually develop suddenly, or may be newly present upon awakening from sleep:  Sudden weakness or numbness of the face, arm, or leg, especially on one side of the body.  Sudden trouble walking or difficulty moving arms or legs.  Sudden confusion.  Sudden personality changes.  Trouble speaking (aphasia) or understanding.  Difficulty swallowing.  Sudden trouble seeing in one or both eyes.  Double vision.  Dizziness.  Loss of  balance or coordination.  Sudden severe headache with no known cause.  Trouble reading or writing. DIAGNOSIS  Your health care provider can often determine the presence or absence of a stroke based on your symptoms, history, and physical exam. Computed tomography (CT) of the brain is usually performed to confirm the stroke, determine causes, and determine stroke severity. Other tests may be done to find the cause of the stroke. These tests may include:  Electrocardiography.  Continuous heart monitoring.  Echocardiography.  Carotid ultrasonography.  Magnetic resonance imaging (MRI).  A scan of the brain circulation.  Blood tests. PREVENTION  The risk of a stroke can be decreased by appropriately treating high blood pressure, high cholesterol, diabetes, heart disease, and obesity and  by quitting smoking, limiting alcohol, and staying physically active. TREATMENT  Time is of the essence. It is important to seek treatment at the first sign of these symptoms because you may receive a medicine to dissolve the clot (thrombolytic) that cannot be given if too much time has passed since your symptoms began. Even if you do not know when your symptoms began, get treatment as soon as possible as there are other treatment options available including oxygen, intravenous (IV) fluids, and medicines to thin the blood (anticoagulants). Treatment of stroke depends on the duration, severity, and cause of your symptoms. Medicines and dietary changes may be used to address diabetes, high blood pressure, and other risk factors. Physical, speech, and occupational therapists will assess you and work with you to improve any functions impaired by the stroke. Measures will be taken to prevent short-term and long-term complications, including infection from breathing foreign material into the lungs (aspiration pneumonia), blood clots in the legs, bedsores, and falls. Rarely, surgery may be needed to remove large blood clots or to open up blocked arteries. HOME CARE INSTRUCTIONS   Take medicines only as directed by your health care provider. Follow the directions carefully. Medicines may be used to control risk factors for a stroke. Be sure you understand all your medicine instructions.  You may be told to take a medicine to thin the blood, such as aspirin or the anticoagulant warfarin. Warfarin needs to be taken exactly as instructed.  Too much and too little warfarin are both dangerous. Too much warfarin increases the risk of bleeding. Too little warfarin continues to allow the risk for blood clots. While taking warfarin, you will need to have regular blood tests to measure your blood clotting time. These blood tests usually include both the PT and INR tests. The PT and INR results allow your health care provider  to adjust your dose of warfarin. The dose can change for many reasons. It is critically important that you take warfarin exactly as prescribed, and that you have your PT and INR levels drawn exactly as directed.  Many foods, especially foods high in vitamin K, can interfere with warfarin and affect the PT and INR results. Foods high in vitamin K include spinach, kale, broccoli, cabbage, collard and turnip greens, brussels sprouts, peas, cauliflower, seaweed, and parsley, as well as beef and pork liver, green tea, and soybean oil. You should eat a consistent amount of foods high in vitamin K. Avoid major changes in your diet, or notify your health care provider before changing your diet. Arrange a visit with a dietitian to answer your questions.  Many medicines can interfere with warfarin and affect the PT and INR results. You must tell your health care provider about any and all medicines you take.  This includes all vitamins and supplements. Be especially cautious with aspirin and anti-inflammatory medicines. Do not take or discontinue any prescribed or over-the-counter medicine except on the advice of your health care provider or pharmacist.  Warfarin can have side effects, such as excessive bruising or bleeding. You will need to hold pressure over cuts for longer than usual. Your health care provider or pharmacist will discuss other potential side effects.  Avoid sports or activities that may cause injury or bleeding.  Be mindful when shaving, flossing your teeth, or handling sharp objects.  Alcohol can change the body's ability to handle warfarin. It is best to avoid alcoholic drinks or consume only very small amounts while taking warfarin. Notify your health care provider if you change your alcohol intake.  Notify your dentist or other health care providers before procedures.  If swallow studies have determined that your swallowing reflex is present, you should eat healthy foods. Including 5 or  more servings of fruits and vegetables a day may reduce the risk of stroke. Foods may need to be a certain consistency (soft or pureed), or small bites may need to be taken in order to avoid aspirating or choking. Certain dietary changes may be advised to address high blood pressure, high cholesterol, diabetes, or obesity.  Food choices that are low in sodium, saturated fat, trans fat, and cholesterol are recommended to manage high blood pressure.  Food choies that are high in fiber, and low in saturated fat, trans fat, and cholesterol may control cholesterol levels.  Controlling carbohydrates and sugar intake is recommended to manage diabetes.  Reducing calorie intake and making food choices that are low in sodium, saturated fat, trans fat, and cholesterol are recommended to manage obesity.  Maintain a healthy weight.  Stay physically active. It is recommended that you get at least 30 minutes of activity on all or most days.  Do not use any tobacco products including cigarettes, chewing tobacco, or electronic cigarettes.  Limit alcohol use even if you are not taking warfarin. Moderate alcohol use is considered to be:  No more than 2 drinks each day for men.  No more than 1 drink each day for nonpregnant women.  Home safety. A safe home environment is important to reduce the risk of falls. Your health care provider may arrange for specialists to evaluate your home. Having grab bars in the bedroom and bathroom is often important. Your health care provider may arrange for equipment to be used at home, such as raised toilets and a seat for the shower.  Physical, occupational, and speech therapy. Ongoing therapy may be needed to maximize your recovery after a stroke. If you have been advised to use a walker or a cane, use it at all times. Be sure to keep your therapy appointments.  Follow all instructions for follow-up with your health care provider. This is very important. This includes any  referrals, physical therapy, rehabilitation, and lab tests. Proper follow-up can prevent another stroke from occurring. SEEK MEDICAL CARE IF:  You have personality changes.  You have difficulty swallowing.  You are seeing double.  You have dizziness.  You have a fever.  You have skin breakdown. SEEK IMMEDIATE MEDICAL CARE IF:  Any of these symptoms may represent a serious problem that is an emergency. Do not wait to see if the symptoms will go away. Get medical help right away. Call your local emergency services (911 in [place] Do not drive yourself to the hospital.  You have sudden weakness  or numbness of the face, arm, or leg, especially on one side of the body.  You have sudden trouble walking or difficulty moving arms or legs.  You have sudden confusion.  You have trouble speaking (aphasia) or understanding.  You have sudden trouble seeing in one or both eyes.  You have a loss of balance or coordination.  You have a sudden, severe headache with no known cause.  You have new chest pain or an irregular heartbeat.  You have a partial or total loss of consciousness. Document Released: 09/21/2005 Document Revised: 02/05/2014 Document Reviewed: 05/01/2012 [facility] Patient Information 2015 [place], [place]. This information is not intended to replace advice given to you by your health care provider. Make sure you discuss any questions you have with your health care provider. Hemorrhagic Stroke A hemorrhagic stroke occurs when a blood vessel in the brain leaks or bursts. Areas of the brain that should receive blood, oxygen, and nutrients from the damaged blood vessel are deprived of blood flow. This causes areas of the brain to become damaged. Damage also occurs to areas of the brain where the leaked blood accumulates and presses on normal tissue. This is a medical emergency. This can cause permanent damage and loss of brain function. CAUSES  A hemorrhagic stroke is caused by a  decrease of oxygen supply to an area of your brain. It is the result of a blood vessel that leaks or ruptures. The leaking or rupturing blood vessel occurs due to one of the following conditions:  A ballooning of a weak section in a blood vessel (aneurysm).  Hardened, thin blood vessels. Blood vessel walls lined with plaque becoming thin and hardened. These hardened, thin artery walls can crack open and allow blood to flow out of the blood vessel.  An abnormal formation of a blood vessel (arteriovenous malformation). This condition results in an abnormal tangle of thin-walled blood vessels. Once the blood vessel ruptures, bleeding occurs. The blood from the ruptured blood vessel accumulates and compresses the surrounding brain tissue. Hemorrhagic strokes are classified as to the location of the bleed. If the bleeding occurs within the brain tissue, the condition is called an intracerebral hemorrhage. If the bleeding occurs in the area between the brain and the thin tissues that cover the brain, the condition is called a subarachnoid hemorrhage.  RISK FACTORS  High blood pressure (hypertension).  Abnormal blood vessels present since birth.  Bleeding disorders, such as hemophilia, sickle cell disease, or liver disease.  The blood becoming too thin while taking blood thinners (anticoagulants).  Smoking.  Excessive alcohol use.  Use of illicit drugs (especially cocaine or methamphetamine). SYMPTOMS   Sudden, severe headache with no known cause. The headache is often described as the worst headache ever experienced.  Nausea or vomiting, especially when combined with other symptoms such as a headache.  Sudden weakness or numbness of the face, arm, or leg, especially on one side of the body.  Sudden trouble walking or difficulty moving the legs.  Sudden confusion.  Sudden personality changes.  Trouble speaking (aphasia) or understanding.  Difficulty swallowing.  Sudden trouble  seeing in one or both eyes.  Double vision.  Dizziness.  Loss of balance or coordination.  Intolerance to light.  Stiff neck. DIAGNOSIS  Your health care provider will often suspect a hemorrhagic stroke based on your symptoms, history, and exam. A CT scan of the brain is usually performed. This is done to confirm the presence of bleeding in the brain, to look for  causes, and to determine severity. Other tests may be done, including:  An MRI.  Angiography.  Blood tests. TREATMENT  The goals of treatment are to try to stop the bleeding, control pressure in the brain, and relieve symptoms.  Medicines may be given to:  Lower blood pressure (antihypertensives).  Relieve pain (analgesics).  Relieve nausea or vomiting.  Stop or prevent seizures.  Prevent the blood vessels in the brain from going into spasm in response to the presence of bleeding.  Other medicines, blood products, or vitamin K may also be given to control the bleeding.  If there is a collection of blood putting pressure on your brain, or if the blood vessel continues to bleed, surgery may be required.  Surgery may also be needed if tests reveal that there are other problems within the blood vessels of the brain that put you at an elevated risk for another bleeding event in the future. Further treatment depends on the duration, severity, and cause of your symptoms. Physical, speech, and occupational therapists will assess you and work to improve any functions impaired by the stroke. Measures will be taken to prevent short-term and long-term complications, including infection from breathing foreign material into the lungs (aspiration pneumonia), blood clots in the legs, bedsores, and falls. HOME CARE INSTRUCTIONS   Take medicines only as instructed by your health care provider.  If swallow studies have determined that your swallowing reflex is present, you should eat healthy foods. Including 5 or more servings of  fruits and vegetables a day may reduce the risk of stroke. Foods may need to be a certain consistency (soft or pureed), or small bites may need to be taken in order to avoid aspirating or choking. Certain dietary changes may be advised to address high blood pressure, high cholesterol, diabetes, or obesity.  Food choices that are low in sodium, saturated fat, trans fat, and cholesterol are recommended to manage high blood pressure.  Food choies that are high in fiber, and low in saturated fat, trans fat, and cholesterol may control cholesterol levels.  Controlling carbohydrates and sugar intake is recommended to manage diabetes.  Reducing calorie intake and making food choices that are low in sodium, saturated fat, trans fat, and cholesterol are recommended to manage obesity.  Maintain a healthy weight.  Stay physically active. It is recommended that you get at least 30 minutes of activity on most or all days.  Limit alcohol use. Moderate alcohol use is considered to be:  No more than 2 drinks each day for men.  No more than 1 drink each day for nonpregnant women.  Stop drug abuse.  Manage any other health care conditions you may have, if applicable.  A safe home environment is important to reduce the risk of falls. Your health care provider may arrange for specialists to evaluate your home. Having grab bars in the bedroom and bathroom is often important. Your health care provider may arrange for special equipment to be used at home, such as raised toilets and a seat for the shower.  Physical, occupational, and speech therapy. Ongoing therapy may be needed to maximize your recovery after a stroke. If you have been advised to use a walker or a cane, use it at all times. Be sure to keep your therapy appointments.  Follow all instructions for follow-up with your health care provider. This is very important. This includes any referrals, physical therapy, rehabilitation, and laboratory tests.  Proper follow-up can prevent another stroke from occurring. [facility]  CARE IF:  You have personality changes.  You have difficulty swallowing.  You are seeing double.  You have dizziness.  You have a fever.  You have skin breakdown. SEEK IMMEDIATE MEDICAL CARE IF:   You have a sudden, severe headache with no known cause.  You have sudden nausea or vomiting with a severe headache.  You have sudden weakness or numbness of the face, arm, or leg, especially on one side of the body.  You have sudden trouble walking or difficulty moving arms or legs.  You have sudden confusion.  You have trouble speaking (aphasia) or understanding.  You have sudden trouble seeing in one or both eyes.  You have a sudden loss of balance or coordination.  You have a stiff neck.  You have difficulty breathing.  You have a partial or total loss of consciousness. Any of these symptoms may represent a serious problem that is an emergency. Do not wait to see if the symptoms will go away. Get medical help at once. Call your local emergency services (911 in [place] Do not drive yourself to the hospital. Document Released: 03/11/2010 Document Revised: 02/05/2014 Document Reviewed: 05/01/2012 [facility] Patient Information 2015 [place], [place]. This information is not intended to replace advice given to you by your health care provider. Make sure you discuss any questions you have with your health care provider.

## 2014-11-08 NOTE — Telephone Encounter (Signed)
lmtcb for pt to see if she has gone to [facility] office yet for DL as this is needed ASAP.

## 2014-11-12 ENCOUNTER — Telehealth: Payer: Self-pay | Admitting: Neurology

## 2014-11-12 NOTE — Telephone Encounter (Signed)
Pt states that she has not been to [facility] yet. States that she does not have a running vehcile at this time and will go when she can.

## 2014-11-12 NOTE — Telephone Encounter (Signed)
Gabapentin refill sent to pharmacy.

## 2014-11-12 NOTE — Telephone Encounter (Signed)
Stew with [facility] called and states to disregard the request for nortriptyline (PAMELOR) 25 MG capsule.  He seen where he has a new Rx on file.

## 2014-11-13 ENCOUNTER — Telehealth (HOSPITAL_COMMUNITY): Payer: Self-pay

## 2014-11-13 NOTE — Telephone Encounter (Signed)
Encounter complete. 

## 2014-11-14 ENCOUNTER — Telehealth: Payer: Self-pay | Admitting: Neurology

## 2014-11-14 NOTE — Telephone Encounter (Signed)
Patient requesting to stop Plavix for procedure on 2/29, please advise

## 2014-11-14 NOTE — Telephone Encounter (Signed)
Pt is calling to state that she is having a procedure done (endoscopy) on 2/29 and the doctor wants her to stop Plavix for 3-5 days.  She is calling see if this would be ok.  Please call and advise.

## 2014-11-15 ENCOUNTER — Ambulatory Visit (HOSPITAL_COMMUNITY)
Admission: RE | Admit: 2014-11-15 | Discharge: 2014-11-15 | Disposition: A | Discharge status: Home / Self Care | Payer: Medicare Other | Source: Ambulatory Visit | Attending: Internal Medicine | Admitting: Internal Medicine

## 2014-11-15 DIAGNOSIS — R112 Nausea with vomiting, unspecified: Secondary | ICD-10-CM | POA: Insufficient documentation

## 2014-11-15 DIAGNOSIS — J45909 Unspecified asthma, uncomplicated: Secondary | ICD-10-CM | POA: Insufficient documentation

## 2014-11-15 DIAGNOSIS — E669 Obesity, unspecified: Secondary | ICD-10-CM | POA: Diagnosis not present

## 2014-11-15 DIAGNOSIS — R0789 Other chest pain: Secondary | ICD-10-CM | POA: Insufficient documentation

## 2014-11-15 DIAGNOSIS — Z8673 Personal history of transient ischemic attack (TIA), and cerebral infarction without residual deficits: Secondary | ICD-10-CM | POA: Diagnosis not present

## 2014-11-15 DIAGNOSIS — Z794 Long term (current) use of insulin: Secondary | ICD-10-CM | POA: Insufficient documentation

## 2014-11-15 DIAGNOSIS — I2510 Atherosclerotic heart disease of native coronary artery without angina pectoris: Secondary | ICD-10-CM | POA: Insufficient documentation

## 2014-11-15 DIAGNOSIS — Z87891 Personal history of nicotine dependence: Secondary | ICD-10-CM | POA: Insufficient documentation

## 2014-11-15 DIAGNOSIS — I10 Essential (primary) hypertension: Secondary | ICD-10-CM | POA: Insufficient documentation

## 2014-11-15 DIAGNOSIS — R002 Palpitations: Secondary | ICD-10-CM | POA: Insufficient documentation

## 2014-11-15 DIAGNOSIS — R42 Dizziness and giddiness: Secondary | ICD-10-CM | POA: Insufficient documentation

## 2014-11-15 DIAGNOSIS — E119 Type 2 diabetes mellitus without complications: Secondary | ICD-10-CM | POA: Diagnosis not present

## 2014-11-15 MED ORDER — TECHNETIUM TC 99M SESTAMIBI GENERIC - CARDIOLITE
10.4000 | Freq: Once | INTRAVENOUS | Status: AC | PRN
  Administered 2014-11-15: 10 via INTRAVENOUS

## 2014-11-15 MED ORDER — TECHNETIUM TC 99M SESTAMIBI GENERIC - CARDIOLITE
30.2000 | Freq: Once | INTRAVENOUS | Status: AC | PRN
  Administered 2014-11-15: 30.2 via INTRAVENOUS

## 2014-11-15 MED ORDER — REGADENOSON 0.4 MG/5ML IV SOLN
0.4000 mg | Freq: Once | INTRAVENOUS | Status: AC
  Administered 2014-11-15: 0.4 mg via INTRAVENOUS

## 2014-11-15 NOTE — Procedures (Addendum)
[place] [address] [place] [address] [phone]  Cardiology Nuclear Med Study  Destiny Robinson is a 59 y.o. female     MRN : [id number]     DOB: 1955/11/30  Procedure Date: 11/15/2014  Nuclear Med Background Indication for Stress Test:  Evaluation for Ischemia History:  Asthma and CAD;Last NUC MPI on 10/28/2000-ischemia;EF=55% Cardiac Risk Factors: CVA, Family History - CAD, History of Smoking, Hypertension, IDDM Type 2, Lipids, Obesity and TIA  Symptoms:  Chest Pain, Diaphoresis, DOE, Fatigue, Light-Headedness, Nausea, Palpitations and Vomiting   Nuclear Pre-Procedure Caffeine/Decaff Intake:  1:00am NPO After: 11am   IV Site: R Forearm  IV 0.9% NS with Angio Cath:  22g  Chest Size (in):  n/a IV Started by: Rolene Course, RN  Height: 5\' 2"  (1.575 m)  Cup Size: C  BMI:  Body mass index is 36.02 kg/(m^2). Weight:  197 lb (89.359 kg)   Tech Comments:  n/a    Nuclear Med Study 1 or 2 day study: 1 day  Stress Test Type:  [place] Provider:  Minus Breeding, MD   Resting Radionuclide: Technetium 80m Sestamibi  Resting Radionuclide Dose: 10.4 mCi   Stress Radionuclide:  Technetium 72m Sestamibi  Stress Radionuclide Dose: 30.2 mCi           Stress Protocol Rest HR: 97 Stress HR: 112  Rest BP: 140/76 Stress BP: 150/72  Exercise Time (min): n/a METS: n/a   Predicted Max HR: 164 bpm % Max HR: 68.9 bpm Rate Pressure Product: 17063  Dose of Adenosine (mg):  n/a Dose of Lexiscan: 0.4 mg  Dose of Atropine (mg): n/a Dose of Dobutamine: n/a mcg/kg/min (at max HR)  Stress Test Technologist: Leane Para, CCT Nuclear Technologist: Imagene Riches, CNMT   Rest Procedure:  Myocardial perfusion imaging was performed at rest 45 minutes following the intravenous administration of Technetium 78m Sestamibi. Stress Procedure:  The patient received IV Lexiscan 0.4 mg over 15-seconds.  Technetium 6m  Sestamibi injected IV at 30-seconds.  There were no significant changes with Lexiscan.  Quantitative spect images were obtained after a 45 minute delay.  Transient Ischemic Dilatation (Normal <1.22):  1.07  QGS EDV:  98 ml QGS ESV:  45 ml LV Ejection Fraction: 54%       Rest ECG: NSR - Normal EKG  Stress ECG: No significant change from baseline ECG  QPS Raw Data Images:  Normal; no motion artifact; normal heart/lung ratio. Stress Images:  Normal homogeneous uptake in all areas of the myocardium. Rest Images:  Normal homogeneous uptake in all areas of the myocardium. Subtraction (SDS):  No evidence of ischemia.  Impression Exercise Capacity:  Lexiscan with no exercise. BP Response:  Normal blood pressure response. Clinical Symptoms:  No significant symptoms noted. ECG Impression:  No significant ST segment change suggestive of ischemia. Comparison with Prior Nuclear Study: Compared to previous reports, there is no evidence of anterior ischemia  Overall Impression:  Normal stress nuclear study.  LV Wall Motion:  NL LV Function; NL Wall Motion   Lorretta Harp, MD  11/15/2014 5:06 PM

## 2014-11-15 NOTE — Telephone Encounter (Signed)
Called pt back on the number on file but she was not available. Left message to call back.   Rosalin Hawking, MD PhD Stroke Neurology 11/15/2014 12:47 PM

## 2014-11-16 NOTE — Telephone Encounter (Signed)
Pt returning call, needs to get information regarding stopping the Plavix.  Please advise.

## 2014-11-17 NOTE — Telephone Encounter (Signed)
I called her again this morning to both her cell phone and home phone listed in chart, but she was not available again. Please let her know that for the procedure, she should stop plavix 3-5 days before the procedure as the doctor instructed her to do. However, at the mean time, she can take full dose ASA 325mg  instead, which should be acceptable for her doctor. Once procedure done, she can switch back from ASA to plavix. This is the way works for most of doctors and patients. Please contact her doctor to see if this works out. If not, please call us back and we will discuss again on this issue. Thanks.  Rosalin Hawking, MD PhD Stroke Neurology 11/17/2014 10:35 AM

## 2014-11-20 ENCOUNTER — Telehealth: Payer: Self-pay | Admitting: Internal Medicine

## 2014-11-20 DIAGNOSIS — G4733 Obstructive sleep apnea (adult) (pediatric): Secondary | ICD-10-CM

## 2014-11-20 DIAGNOSIS — Z9989 Dependence on other enabling machines and devices: Principal | ICD-10-CM

## 2014-11-20 NOTE — Telephone Encounter (Signed)
LMTCB

## 2014-11-20 NOTE — Telephone Encounter (Signed)
Destiny Robinson with [facility] brought me a copy of DL for CPAP; I have given DL and previous ONO results to CY with phone message to advise and return to Triage. Thanks.

## 2014-11-20 NOTE — Telephone Encounter (Signed)
Spoke with patient and informed her of Dr Phoebe Sharps message to switch to the ASA 325 mg, while off the Plavix and then return back to Plavix after procedure is done.

## 2014-11-20 NOTE — Telephone Encounter (Signed)
CPAP download from [facility] shows she is not using it enough nights and it is not controlling her well enough.  Also, ONOX on 10/24/14 showed that her oxygen level with CPAP on room air is getting to low.  If we can't correct her CPAP for better control, then Medicare would require a new sleep study wearing CPAP before approving addition of home O2.  Order- [facility]- change CPAP to auto-PAP, 5-15 cwp   Dx OSA              Request download in 2 weeks for pressure compliance

## 2014-11-20 NOTE — Telephone Encounter (Signed)
Pt states that [facility] got a download off her CPAP machine a week ago and she hasnt heard anything.  Katie , have you seen these results?

## 2014-11-21 ENCOUNTER — Telehealth: Payer: Self-pay | Admitting: Cardiology

## 2014-11-21 NOTE — Telephone Encounter (Signed)
Spoke to patient. Result given . Verbalized understanding Test results routed to primary doctor [place]

## 2014-11-21 NOTE — Telephone Encounter (Signed)
Pt very frustrated and states that she cannot help her health conditions and the fact that she was in the hospital. Pt states that while she was in the hospital she did not wear her CPAP. Pt states that she is not understanding why she is being told that she is not compliant and not using "enough". Pt continued yelling stating that she wears it every night and that she is sorry that she wakes up with diarrhea multiple times throughout the night and has to get up and that he terrible diabetes makes her sick and she has to get up go throw up. Pt states that she is in a bad situation with her health and that she is trying to do everything in her ability to wear this machine as much as possible. Pt states that after multiple time of getting up throughout the night she puts the mask back on every time.   How do we get around this? Pt wants to know what else she is supposed to do to be more "compliant" if her health is interfering.   Pt also states that she started using O2 at night again at the beginning of this month (february) and would like to know if she needs to continue this.  Will she wear this during the 2 weeks in addition to the Auto CPAP?  Please advise Dr Annamaria Boots so that we may place this order. Thanks.

## 2014-11-21 NOTE — Telephone Encounter (Signed)
[phone], pt cb

## 2014-11-21 NOTE — Telephone Encounter (Signed)
Pt would like her stress test results from 11-15-14 please.

## 2014-11-22 NOTE — Telephone Encounter (Signed)
PT called back .  Informed her that we are still waiting on response from Dr Annamaria Boots.  She requests a call back on her cell number at [phone].

## 2014-11-22 NOTE — Telephone Encounter (Signed)
Please tell Mrs Barsky we understand that it is difficult. We are trying to work out something that is better. Our problem is insurance may take the CPAP away if she can't use it enough to meet the minimum of at least 4 hours per night on at least 70% of nights.   Order- DME change CPAP to auto PAP 5- 20 cwp with her O2 2L during sleep    Dx OSA

## 2014-11-22 NOTE — Telephone Encounter (Signed)
Called and spoke to pt. Informed pt of the recs per CY. Pt is requesting a full face mask before the change in pressure. Pt stated she wants the full face mask instead of the nasal mask because she thinks she is sleeping with her mouth open.   CY please advise.

## 2014-11-22 NOTE — Telephone Encounter (Signed)
Pt cb, [phone]

## 2014-11-23 NOTE — Telephone Encounter (Signed)
New order placed and message was sent to [facility] from [facility] to make her aware.  Called and lmom to make the pt aware that order has been sent in.

## 2014-11-23 NOTE — Telephone Encounter (Signed)
Ok- DME order full face mask for CPAP.  Patient's prescription is for mask of choice, so she may not even need a new script form [place]. She can call the DME and ask.

## 2014-11-26 ENCOUNTER — Telehealth: Payer: Self-pay | Admitting: Internal Medicine

## 2014-11-26 DIAGNOSIS — G4733 Obstructive sleep apnea (adult) (pediatric): Secondary | ICD-10-CM

## 2014-11-26 NOTE — Telephone Encounter (Signed)
Spoke with [facility] with [facility],  states pt is very upset that no order was placed for a pressure change for her cpap.  There was an order placed for a new mask for a pt, but no pressure change.  According to [facility], it looks like [place] had authorized the pressure change but patient wanted to try a new mask before the pressure change.  CY please advise if you are ok with a pressure change for pt's cpap, and if so what the pressure is.  Thank you!

## 2014-11-27 ENCOUNTER — Ambulatory Visit (INDEPENDENT_AMBULATORY_CARE_PROVIDER_SITE_OTHER): Payer: Medicare Other

## 2014-11-27 VITALS — BP 151/86 | HR 86 | Resp 12

## 2014-11-27 DIAGNOSIS — E1142 Type 2 diabetes mellitus with diabetic polyneuropathy: Secondary | ICD-10-CM

## 2014-11-27 DIAGNOSIS — G629 Polyneuropathy, unspecified: Secondary | ICD-10-CM

## 2014-11-27 DIAGNOSIS — M158 Other polyosteoarthritis: Secondary | ICD-10-CM

## 2014-11-27 DIAGNOSIS — E1342 Other specified diabetes mellitus with diabetic polyneuropathy: Secondary | ICD-10-CM

## 2014-11-27 DIAGNOSIS — Q828 Other specified congenital malformations of skin: Secondary | ICD-10-CM

## 2014-11-27 DIAGNOSIS — M76821 Posterior tibial tendinitis, right leg: Secondary | ICD-10-CM

## 2014-11-27 DIAGNOSIS — M2021 Hallux rigidus, right foot: Secondary | ICD-10-CM

## 2014-11-27 DIAGNOSIS — M2022 Hallux rigidus, left foot: Secondary | ICD-10-CM

## 2014-11-27 DIAGNOSIS — R269 Unspecified abnormalities of gait and mobility: Secondary | ICD-10-CM

## 2014-11-27 NOTE — Progress Notes (Signed)
   Subjective:    Patient ID: Destiny Robinson, female    DOB: 10-06-1955, 59 y.o.   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 There are no open wounds or ulcers patient does have profound neuropathy.       Assessment & Plan:  Assessment diabetes with complications history of complications and deformities of digits with keratoses this time 1 pair shoes and 3 pairs of insoles are dispensed written instructions are given oral instructions are given for appropriate follow-up with him next 3 months for continued palliative care in the future as needed will maintain shoes as instructed are Claudette Stapler DPM

## 2014-11-27 NOTE — Telephone Encounter (Signed)
Order [facility]- change CPAP to auto 5-15  Dx OSA

## 2014-11-27 NOTE — Telephone Encounter (Signed)
Order has been placed per CY. Destiny Robinson is aware that this has been done.

## 2014-11-27 NOTE — Patient Instructions (Signed)

## 2014-11-28 ENCOUNTER — Ambulatory Visit: Payer: Medicare Other | Admitting: Neurology

## 2014-12-11 ENCOUNTER — Ambulatory Visit (INDEPENDENT_AMBULATORY_CARE_PROVIDER_SITE_OTHER): Payer: Medicare Other | Admitting: Neurology

## 2014-12-11 ENCOUNTER — Encounter: Payer: Self-pay | Admitting: Neurology

## 2014-12-11 VITALS — BP 148/79 | HR 97 | Ht 62.0 in | Wt 197.6 lb

## 2014-12-11 DIAGNOSIS — E785 Hyperlipidemia, unspecified: Secondary | ICD-10-CM

## 2014-12-11 DIAGNOSIS — I10 Essential (primary) hypertension: Secondary | ICD-10-CM | POA: Diagnosis not present

## 2014-12-11 DIAGNOSIS — G43109 Migraine with aura, not intractable, without status migrainosus: Secondary | ICD-10-CM

## 2014-12-11 DIAGNOSIS — E669 Obesity, unspecified: Secondary | ICD-10-CM

## 2014-12-11 DIAGNOSIS — E119 Type 2 diabetes mellitus without complications: Secondary | ICD-10-CM | POA: Insufficient documentation

## 2014-12-11 DIAGNOSIS — E1159 Type 2 diabetes mellitus with other circulatory complications: Secondary | ICD-10-CM

## 2014-12-11 DIAGNOSIS — G4733 Obstructive sleep apnea (adult) (pediatric): Secondary | ICD-10-CM | POA: Diagnosis not present

## 2014-12-11 DIAGNOSIS — R519 Headache, unspecified: Secondary | ICD-10-CM | POA: Insufficient documentation

## 2014-12-11 DIAGNOSIS — R51 Headache: Secondary | ICD-10-CM

## 2014-12-11 DIAGNOSIS — Z9989 Dependence on other enabling machines and devices: Secondary | ICD-10-CM

## 2014-12-11 NOTE — Progress Notes (Signed)
STROKE NEUROLOGY FOLLOW UP NOTE  NAME: Destiny Robinson DOB: 1956/04/04  REASON FOR VISIT: stroke follow up HISTORY FROM: pt and chart  Today we had the pleasure of seeing Destiny Robinson in follow-up at our Neurology Clinic. Pt was accompanied by no one.   History Summary Destiny Robinson is a 59 y.o. female with PMH of hypertension, diabetes, diabetic gastroparesis, hyperlipidemia, obesity, OSA on CPAP who presents as a new patient for episodic left-sided weakness on 10/30/14.   Patient stated that since October last year she started to have this episodic left-sided numbness, involving left face, arm and leg. It happens 4-5 times per months for October, November and December. Most time it happens one patient was sitting still. The numbness happens acutely, lasting about 30 minutes and resolved spontaneously. Starting in December, her episode was followed by headache, the headache is more bilateral frontal temporal and occipital, 8-9/10, she has to take 2 x 500mg  Tylenol for the headache and take a nap for 2 hours. After the nap headache was gone. Blurry vision was associated with headache, denies nausea vomiting, photophobia phonophobia. After each episode she will feeding generalized weakness for a while and recovered. On 10/01/2014, she had again left-sided numbness, however, it did not resolve within 30 minutes. After 2 hours, she presented herself to the ER for further evaluation. CAT scan of the head showed no acute abnormality, but bilateral basal ganglia lacunar infarcts. MRI brain confirmed no acute infarcts but bilateral basal ganglia lacunar infarcts. Her other stroke workup negative, except high LDL and TG. The numbness lasted about 12 hours and then subsided. She was put on aspirin 81 and discharged with outpatient follow-up. After discharge, her episode was initially subsided, however for the last 3 days, she had again 5 times of left-sided numbness episodes followed by headache. She came  in today for further evaluation. She had a history of hyperlipidemia, follow-up with her PCP [name] after discharge, and was put on pravastatin 80 mg, fenofibrate, and niacin, and was told her TG and LDL were much better. She had allergic reaction to Lipitor, and not able to tolerate with Crestor or simvastatin. She had a history of hypertension on HCTZ, losartan, and metoprolol. She stated her blood pressure at home around 135/80, but today in clinic 156/93. She has a history of diabetes, diabetic neuropathy, diabetic gastroparesis, on insulin injection, metformin and glipizide. She said her sugar was between 80-90 at home, her A1c was 6.6 in December. She denies any history of migraine, however her sister and her mom have migraine. She denies any visual aura with headache, but admits to headache associated with the blurry vision. She had a history of neuropathy, has left lower extremity below knee numbness before the admission, constant and 2 now. She is obese, former smoker but quit at age of 47. She had OSA on CPAP, has been followed with pulmonology. She denies any alcohol drinking, or illicit drug use.  Interval History During the interval time, the patient has been doing well. She was put on nortriptyline after last visit. She stated that for the last 1+ month, she only had one episode on 11/18/14 with left sided numbness lasting 30 min followed by headache and she had to sleep for 2 hours. The frequency much decreased than before. She had EEG which showed no seizure activity. Her BP 148/79 today but normally 135/85 at home. Her glucose between 50-150 at home. She has appointment on 01/04/15 with PCP.  REVIEW OF SYSTEMS: Full 14 system review of systems performed and notable only for those listed below and in HPI above, all others are negative:  Constitutional:   Cardiovascular:  Ear/Nose/Throat:   Skin:  Eyes:  Blurry vision Respiratory:   Gastroitestinal:   Genitourinary:    Hematology/Lymphatic:   Endocrine:  Musculoskeletal:   Allergy/Immunology:   Neurological:   Psychiatric:  Sleep: frequent waking  The following represents the patient's updated allergies and side effects list: Allergies  Allergen Reactions  . Codeine Hypertension  . Penicillins Anaphylaxis  . Reglan [Metoclopramide] Swelling and Rash    5MG  MAKES THROAT SWELL  . Xifaxan [Rifaximin] Hives, Shortness Of Breath and Swelling    SOB 550MG  MAKES THROAT SWELL SOB  . Zocor [Simvastatin] Anaphylaxis and Hives  . Crestor [Rosuvastatin] Other (See Comments)    Muscles ache    The neurologically relevant items on the patient's problem list were reviewed on today's visit.  Neurologic Examination  A problem focused neurological exam (12 or more points of the single system neurologic examination, vital signs counts as 1 point, cranial nerves count for 8 points) was performed.  Blood pressure 148/79, pulse 97, height 5\' 2"  (1.575 m), weight 197 lb 9.6 oz (89.631 kg).  General - obese, well developed, in no apparent distress.  Ophthalmologic - Sharp disc margins OU.  Cardiovascular - Regular rate and rhythm with no murmur.  Mental Status -  Level of arousal and orientation to time, place, and person were intact. Language including expression, naming, repetition, comprehension, reading, and writing was assessed and found intact.  Cranial Nerves II - XII - II - Visual field intact OU. III, IV, VI - Extraocular movements intact. V - Facial sensation intact bilaterally. VII - Facial movement intact bilaterally. VIII - Hearing & vestibular intact bilaterally. X - Palate elevates symmetrically. XI - Chin turning & shoulder shrug intact bilaterally. XII - Tongue protrusion intact.  Motor Strength - The patient's strength was normal in all extremities and pronator drift was absent.  Bulk was normal and fasciculations were absent.   Motor Tone - Muscle tone was assessed at the neck and  appendages and was normal.  Reflexes - The patient's reflexes were normal in all extremities and she had no pathological reflexes.  Sensory - Light touch, temperature/pinprick, vibration and proprioception, and Romberg testing were assessed and were normal.    Coordination - The patient had normal movements in the hands and feet with no ataxia or dysmetria.  Tremor was absent.  Gait and Station - The patient's transfers, posture, gait, station, and turns were observed as normal.  Data reviewed: I personally reviewed the images and agree with the radiology interpretations.  CT head 10/01/14 -  1. Age-indeterminate lacunar infarcts in the right and left basal ganglia. 2. Ectasia of the basilar artery, described previously.  MRI and MRA head and neck 10/01/14 Negative for acute infarct. Moderately severe chronic microvascular ischemic changes. Moderately severe chronic micro hemorrhage in the brain. 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 No focal intracranial stenosis  CUS - Bilateral: 1-39% ICA stenosis. Vertebral artery flow is antegrade.  2D echo - - Left ventricle: The cavity size was normal. Wall thickness was normal. Systolic function was mildly reduced. The estimated ejection fraction was in the range of 45% to 50%. Wall motion was normal; there were no regional wall motion abnormalities. Doppler parameters are consistent with abnormal left ventricular relaxation (grade 1 diastolic dysfunction). -  Mitral valve: Calcified annulus. Mildly thickened leaflets . - Pulmonary arteries: PA peak pressure: 38 mm Hg (S).  Component  Latest Ref Rng 10/02/2014  Cholesterol  0 - 200 mg/dL 259 (H)  Triglycerides  <150 mg/dL 523 (H)  HDL  >39 mg/dL 33 (L)  Total CHOL/HDL Ratio   7.8  VLDL  0 - 40 mg/dL UNABLE TO CALCULATE IF TRIGLYCERIDE OVER 400 mg/dL  LDL (calc)  0  - 99 mg/dL UNABLE TO CALCULATE IF TRIGLYCERIDE OVER 400 mg/dL  Hgb A1c MFr Bld  <5.7 % 6.6 (H)  Mean Plasma Glucose  <117 mg/dL 143 (H)          Assessment: As you may recall, she is a 59 y.o. Caucasian female with PMH of hypertension, diabetes, diabetic gastroparesis, hyperlipidemia, obesity, OSA on CPAP follow up in clinic for episodic left-sided weakness followed by headache. Stroke work up negative except high TG and LDL. EEG negative for seizure. 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 Put on nortriptyline and frequency much decreased, only one episode for the last 1+ months. Will continue current management.  Plan:  - continue plavix and pravastatin for stroke prevention - continue finofibrate and niacin for HLD - continue nortriptyline for migraine prevention - take tylenol PRN for migraine abortion - check BP and glucose at home - Follow up with your primary care physician for stroke risk factor modification. Recommend maintain blood pressure goal <130/80, diabetes with hemoglobin A1c goal below 6.5% and lipids with LDL cholesterol goal below 70 mg/dL.  - RTC in 3-4 months  No orders of the defined types were placed in this encounter.    Meds ordered this encounter  Medications  . fluticasone (FLONASE) 50 MCG/ACT nasal spray    Sig: Place 2 sprays into both nostrils daily as needed.  Marland Kitchen glipiZIDE (GLUCOTROL XL) 5 MG 24 hr tablet    Sig: Take 5 mg by mouth daily.  . Cholecalciferol (VITAMIN D3) 2000 UNITS capsule    Sig: Take 2,000 Units by mouth daily.  . calcium carbonate (OS-CAL) 600 MG TABS tablet    Sig: Take 600 mg by mouth daily.    Patient Instructions  - continue plavix and pravastatin for stroke prevention - continue fenofibrate and niacin for HLD - check BP and glucose at home - Follow up with your primary care physician for stroke risk factor modification. Recommend maintain blood pressure goal <130/80, diabetes with  hemoglobin A1c goal below 6.5% and lipids with LDL cholesterol goal below 70 mg/dL.  - continue nortriptyline for migraine prevention - take tylenol as soon as you feels HA is coming or your numbness starts. - follow up in 3-4 months.    Rosalin Hawking, MD PhD [facility] [address] [place], [place] [address] [phone]

## 2014-12-11 NOTE — Patient Instructions (Signed)
-   continue plavix and pravastatin for stroke prevention - continue fenofibrate and niacin for HLD - check BP and glucose at home - Follow up with your primary care physician for stroke risk factor modification. Recommend maintain blood pressure goal <130/80, diabetes with hemoglobin A1c goal below 6.5% and lipids with LDL cholesterol goal below 70 mg/dL.  - continue nortriptyline for migraine prevention - take tylenol as soon as you feels HA is coming or your numbness starts. - follow up in 3-4 months.

## 2014-12-24 ENCOUNTER — Telehealth: Payer: Self-pay | Admitting: Internal Medicine

## 2014-12-24 ENCOUNTER — Encounter: Payer: Self-pay | Admitting: Internal Medicine

## 2014-12-24 DIAGNOSIS — G4733 Obstructive sleep apnea (adult) (pediatric): Secondary | ICD-10-CM

## 2014-12-24 NOTE — Telephone Encounter (Signed)
lmomtcb x1 for pt 

## 2014-12-25 NOTE — Telephone Encounter (Signed)
lmtcb x2 

## 2014-12-26 NOTE — Telephone Encounter (Signed)
Called and spoke to pt. Informed pt of the recs per CY. Order placed. Pt verbalized understanding and denied any further questions or concerns at this time.

## 2014-12-26 NOTE — Telephone Encounter (Signed)
Called and spoke to pt. Pt requesting results of CPAP titration.   Dr. Annamaria Boots please advise.

## 2014-12-26 NOTE — Telephone Encounter (Signed)
Her CPAP compliance is great- using every night. Now set on autopap with range 5-15 Control is not quite what we would like- still at 9 episodes/ hour and we would like to be at 5 or less. Recommend- I would like to order [facility] change to autopap range 10-20, with next download in 2 weeks for dx OSA

## 2015-01-02 ENCOUNTER — Emergency Department (HOSPITAL_COMMUNITY): Payer: Medicare Other

## 2015-01-02 ENCOUNTER — Emergency Department (HOSPITAL_COMMUNITY)
Admission: EM | Admit: 2015-01-02 | Discharge: 2015-01-02 | Disposition: A | Discharge status: Home / Self Care | Payer: Medicare Other | Attending: Emergency Medicine | Admitting: Emergency Medicine

## 2015-01-02 ENCOUNTER — Other Ambulatory Visit: Payer: Self-pay | Admitting: Orthopedic Surgery

## 2015-01-02 ENCOUNTER — Encounter (HOSPITAL_COMMUNITY): Payer: Self-pay | Admitting: Emergency Medicine

## 2015-01-02 DIAGNOSIS — Z794 Long term (current) use of insulin: Secondary | ICD-10-CM | POA: Insufficient documentation

## 2015-01-02 DIAGNOSIS — S59912A Unspecified injury of left forearm, initial encounter: Secondary | ICD-10-CM | POA: Diagnosis present

## 2015-01-02 DIAGNOSIS — S52592A Other fractures of lower end of left radius, initial encounter for closed fracture: Secondary | ICD-10-CM | POA: Diagnosis not present

## 2015-01-02 DIAGNOSIS — Y9389 Activity, other specified: Secondary | ICD-10-CM | POA: Insufficient documentation

## 2015-01-02 DIAGNOSIS — Z880 Allergy status to penicillin: Secondary | ICD-10-CM | POA: Diagnosis not present

## 2015-01-02 DIAGNOSIS — Z79899 Other long term (current) drug therapy: Secondary | ICD-10-CM | POA: Insufficient documentation

## 2015-01-02 DIAGNOSIS — Z87891 Personal history of nicotine dependence: Secondary | ICD-10-CM | POA: Diagnosis not present

## 2015-01-02 DIAGNOSIS — S52602A Unspecified fracture of lower end of left ulna, initial encounter for closed fracture: Secondary | ICD-10-CM

## 2015-01-02 DIAGNOSIS — J45909 Unspecified asthma, uncomplicated: Secondary | ICD-10-CM | POA: Insufficient documentation

## 2015-01-02 DIAGNOSIS — Y998 Other external cause status: Secondary | ICD-10-CM | POA: Diagnosis not present

## 2015-01-02 DIAGNOSIS — Z8701 Personal history of pneumonia (recurrent): Secondary | ICD-10-CM | POA: Diagnosis not present

## 2015-01-02 DIAGNOSIS — Z87442 Personal history of urinary calculi: Secondary | ICD-10-CM | POA: Diagnosis not present

## 2015-01-02 DIAGNOSIS — Z7902 Long term (current) use of antithrombotics/antiplatelets: Secondary | ICD-10-CM | POA: Insufficient documentation

## 2015-01-02 DIAGNOSIS — F329 Major depressive disorder, single episode, unspecified: Secondary | ICD-10-CM | POA: Diagnosis not present

## 2015-01-02 DIAGNOSIS — W108XXA Fall (on) (from) other stairs and steps, initial encounter: Secondary | ICD-10-CM | POA: Insufficient documentation

## 2015-01-02 DIAGNOSIS — G4700 Insomnia, unspecified: Secondary | ICD-10-CM | POA: Diagnosis not present

## 2015-01-02 DIAGNOSIS — Y9289 Other specified places as the place of occurrence of the external cause: Secondary | ICD-10-CM | POA: Diagnosis not present

## 2015-01-02 DIAGNOSIS — I10 Essential (primary) hypertension: Secondary | ICD-10-CM | POA: Insufficient documentation

## 2015-01-02 DIAGNOSIS — Z9981 Dependence on supplemental oxygen: Secondary | ICD-10-CM | POA: Diagnosis not present

## 2015-01-02 DIAGNOSIS — S52502A Unspecified fracture of the lower end of left radius, initial encounter for closed fracture: Secondary | ICD-10-CM

## 2015-01-02 DIAGNOSIS — E119 Type 2 diabetes mellitus without complications: Secondary | ICD-10-CM | POA: Diagnosis not present

## 2015-01-02 MED ORDER — LORAZEPAM 2 MG/ML IJ SOLN
1.0000 mg | Freq: Once | INTRAMUSCULAR | Status: AC
  Administered 2015-01-02: 1 mg via INTRAVENOUS
  Filled 2015-01-02: qty 1

## 2015-01-02 MED ORDER — HYDROMORPHONE HCL 1 MG/ML IJ SOLN
1.0000 mg | Freq: Once | INTRAMUSCULAR | Status: AC
  Administered 2015-01-02: 1 mg via INTRAVENOUS
  Filled 2015-01-02: qty 1

## 2015-01-02 MED ORDER — KETOROLAC TROMETHAMINE 15 MG/ML IJ SOLN
15.0000 mg | Freq: Once | INTRAMUSCULAR | Status: AC
  Administered 2015-01-02: 15 mg via INTRAVENOUS
  Filled 2015-01-02: qty 1

## 2015-01-02 MED ORDER — ONDANSETRON HCL 4 MG/2ML IJ SOLN
4.0000 mg | Freq: Once | INTRAMUSCULAR | Status: AC
  Administered 2015-01-02: 4 mg via INTRAVENOUS
  Filled 2015-01-02: qty 2

## 2015-01-02 MED ORDER — FENTANYL CITRATE 0.05 MG/ML IJ SOLN
100.0000 ug | Freq: Once | INTRAMUSCULAR | Status: AC
  Administered 2015-01-02: 100 ug via INTRAVENOUS
  Filled 2015-01-02: qty 2

## 2015-01-02 MED ORDER — SODIUM CHLORIDE 0.9 % IV SOLN
INTRAVENOUS | Status: DC
  Administered 2015-01-02: 10:00:00 via INTRAVENOUS

## 2015-01-02 MED ORDER — OXYCODONE-ACETAMINOPHEN 5-325 MG PO TABS: 1.0000 | ORAL_TABLET | ORAL | Status: DC | PRN

## 2015-01-02 NOTE — H&P (Signed)
Destiny Robinson is an 59 y.o. female.   CC / Reason for Visit: Left wrist injury HPI: This patient is a 59 year old female who presents for evaluation of her right wrist injury that occurred today when she tripped on the porch and fell.  She landed on an outstretched hand.  She was evaluated emergency department, found to have a comminuted distal radius fracture and placed into a sugar tong splint.  She presents for further evaluation and management  Past Medical History  Diagnosis Date  . Hypertension   . Hypercholesterolemia   . Bronchitis   . Pancreatitis   . Complication of anesthesia 12/11/11    "angry, mean, hateful after" endoscopy & colonoscopy  . Asthma ~ 1991  . Pneumonia 06/2011; 07/2011  . Diabetes mellitus type 2, controlled   . GERD (gastroesophageal reflux disease)   . GI bleeding 12/21/11  . Hx of colonoscopy with polypectomy 12/11/11    "took out 7; couldn't get #8, that one was precancerous"  . Depression   . 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 Paralysis gastric   . Kidney stones     Past Surgical History  Procedure Laterality Date  . Cesarean section  1981; 1986  . Esophagogastroduodenoscopy  12/11/2011    Procedure: ESOPHAGOGASTRODUODENOSCOPY (EGD);  Surgeon: Missy Sabins, MD;  Location: [facility] ENDOSCOPY;  Service: Endoscopy;  Laterality: N/A;  . Colonoscopy  12/11/2011    Procedure: COLONOSCOPY;  Surgeon: Missy Sabins, MD;  Location: [place];  Service: Endoscopy;  Laterality: N/A;  . Abdominal hysterectomy  2002  . Cholecystectomy  ~ 2002  . Tonsillectomy and adenoidectomy  1988  . Appendectomy  ~ 2002    "w/hysterectomy"  . Colonoscopy  04/26/2012    Procedure: COLONOSCOPY;  Surgeon: Wonda Horner, MD;  Location: [facility] ENDOSCOPY;  Service: Endoscopy;  Laterality: N/A;  apc  . Hernia repair      Umbilical    Family History  Problem Relation Age of Onset  . Colon cancer Sister   . Alzheimer's disease Mother   . Heart disease  Father     Enlarged heart   Social History:  reports that she quit smoking about 18 years ago. Her smoking use included Cigarettes. She has a .72 pack-year smoking history. She has never used smokeless tobacco. She reports that she does not drink alcohol or use illicit drugs.  Allergies:  Allergies  Allergen Reactions  . Codeine Hypertension  . Penicillins Anaphylaxis  . Reglan [Metoclopramide] Swelling and Rash    5MG  MAKES THROAT SWELL  . Xifaxan [Rifaximin] Hives, Shortness Of Breath and Swelling    SOB 550MG  MAKES THROAT SWELL SOB  . Zocor [Simvastatin] Anaphylaxis and Hives  . Crestor [Rosuvastatin] Other (See Comments)    Muscles ache    No prescriptions prior to admission    No results found for this or any previous visit (from the past 48 hour(s)). Dg Forearm Left  01/02/2015   CLINICAL DATA:  Wrist pain and deformity after falling down steps this morning at the patient's son's home.  EXAM: LEFT FOREARM - 2 VIEW  COMPARISON:  09/21/2013  FINDINGS: There is a markedly comminuted impacted angulated fracture of the distal radius. The fracture extends 4 cm proximally and does involve the articular surface of the distal radius. There is also a comminuted displaced fracture of the ulnar styloid. The proximal radius and ulna are normal.  IMPRESSION: Comminuted fractures of the  distal left radius and ulna as described.   Electronically Signed   By: Lorriane Shire M.D.   On: 01/02/2015 10:17   Dg Wrist Complete Left  01/02/2015   CLINICAL DATA:  Fall this a.m.  Pain.  Initial evaluation.  EXAM: LEFT WRIST - COMPLETE 3+ VIEW  COMPARISON:  None.  FINDINGS: Comminuted distal left radial fracture is noted with severe displacement and angulation deformity. The fractures extend into the radiocarpal joint space. Ulnar styloid displaced fracture is also noted.  IMPRESSION: Severe angulated displaced comminuted distal radial fracture with extension into the radiocarpal joint space. Associated  ulnar styloid fracture present.   Electronically Signed   By: Marcello Moores  Register   On: 01/02/2015 10:15   Dg Hand Complete Left  01/02/2015   CLINICAL DATA:  Patient fell down steps with pain and deformity  EXAM: LEFT HAND - COMPLETE 3+ VIEW  COMPARISON:  September 21, 2013  FINDINGS: Frontal, oblique, and lateral views were obtained. There is a comminuted fracture of the distal radius with multiple impacted fracture fragments in the radial metaphysis region. Fracture fragments extending into the radiocarpal joint. There is widening of the surface of the distal radius due to the fracture. A fracture fragment extends from the metaphysis into the distal diaphysis as well. There is avulsion of the ulnar styloid. There is no gross dislocation. There is moderate narrowing of all MCP, PIP, and DIP joints. No dislocation. No erosive change or periostitis.  IMPRESSION: Comminuted fracture distal radius with impacted fracture fragments. Avulsion ulnar styloid. No dislocation. Narrowing of essentially all MCP, PIP, and DIP joints.   Electronically Signed   By: Lowella Grip III M.D.   On: 01/02/2015 10:16    Review of Systems  All other systems reviewed and are negative.   There were no vitals taken for this visit. Physical Exam  Constitutional:  WD, WN, NAD HEENT:  NCAT, EOMI Neuro/Psych:  Alert & oriented to person, place, and time; appropriate mood & affect Lymphatic: No generalized UE edema or lymphadenopathy Extremities / MSK:  Both UE are normal with respect to appearance, ranges of motion, joint stability, muscle strength/tone, sensation, & perfusion except as otherwise noted:  Left upper extremity sugar tong splint applied.  No tenderness to palpation about the elbow.  Digits are warm and pink with brisk capillary refill and normal sensibility and motor findings.  Labs / Xrays:  X-rays from earlier today reveal a comminuted intra-articular distal radius fracture with comminution extending toward  the diametaphyseal junction  Assessment: Comminuted intra-articular displaced left distal radius fracture with proximal extension  Plan:  I discussed these findings with her and her sister, and recommended that she consider operative treatment to obtain and maintain a more anatomical alignment in order to optimize functional recovery.  Plastic models were used.  She is provided some additional Percocet to help with pain control between now and surgery, which is tentatively planned for Monday, 01-07-15.  The details of the operative procedure were discussed with the patient.  Questions were invited and answered.  In addition to the goal of the procedure, the risks of the procedure to include but not limited to bleeding; infection; damage to the nerves or blood vessels that could result in bleeding, numbness, weakness, chronic pain, and the need for additional procedures; stiffness; the need for revision surgery; and anesthetic risks, were reviewed.  No specific outcome was guaranteed or implied.  Informed consent was obtained.   Lindsee Labarre A. 01/02/2015, 5:19 PM

## 2015-01-02 NOTE — ED Notes (Signed)
Bed: WA14 Expected date:  Expected time:  Means of arrival:  Comments: EMS-fall 

## 2015-01-02 NOTE — ED Notes (Signed)
Per EMS pt tripped coming down stairs, fell and landed on her left arm. Obvious deformities to left wrist and abrasion to left shin.

## 2015-01-02 NOTE — Discharge Instructions (Signed)
Radial Fracture You have a broken bone (fracture) of the forearm. This is the part of your arm between the elbow and your wrist. Your forearm is made up of two bones. These are the radius and ulna. Your fracture is in the radial shaft. This is the bone in your forearm located on the thumb side. A cast or splint is used to protect and keep your injured bone from moving. The cast or splint will be on generally for about 5 to 6 weeks, with individual variations. HOME CARE INSTRUCTIONS   Keep the injured part elevated while sitting or lying down. Keep the injury above the level of your heart (the center of the chest). This will decrease swelling and pain.  Apply ice to the injury for 15-20 minutes, 03-04 times per day while awake, for 2 days. Put the ice in a plastic bag and place a towel between the bag of ice and your cast or splint.  Move your fingers to avoid stiffness and minimize swelling.  If you have a plaster or fiberglass cast:  Do not try to scratch the skin under the cast using sharp or pointed objects.  Check the skin around the cast every day. You may put lotion on any red or sore areas.  Keep your cast dry and clean.  If you have a plaster splint:  Wear the splint as directed.  You may loosen the elastic around the splint if your fingers become numb, tingle, or turn cold or blue.  Do not put pressure on any part of your cast or splint. It may break. Rest your cast only on a pillow for the first 24 hours until it is fully hardened.  Your cast or splint can be protected during bathing with a plastic bag. Do not lower the cast or splint into water.  Only take over-the-counter or prescription medicines for pain, discomfort, or fever as directed by your caregiver. SEEK IMMEDIATE MEDICAL CARE IF:   Your cast gets damaged or breaks.  You have more severe pain or swelling than you did before getting the cast.  You have severe pain when stretching your fingers.  There is a bad  smell, new stains and/or pus-like (purulent) drainage coming from under the cast.  Your fingers or hand turn pale or blue and become cold or your loose feeling. Document Released: 03/04/2006 Document Revised: 12/14/2011 Document Reviewed: 05/31/2006 [facility] Patient Information 2015 [place], [place]. This information is not intended to replace advice given to you by your health care provider. Make sure you discuss any questions you have with your health care provider.

## 2015-01-03 ENCOUNTER — Encounter (HOSPITAL_BASED_OUTPATIENT_CLINIC_OR_DEPARTMENT_OTHER): Payer: Self-pay | Admitting: *Deleted

## 2015-01-03 NOTE — Progress Notes (Signed)
pts sister staying with her-sedated from pain meds-sister will bring her-all meds and cpap and overnight bag just in case she has to stay-they live out of town Had a cardiac work up 2/16-stress negative-echo ef 50% Is compliant cpap

## 2015-01-05 NOTE — ED Provider Notes (Signed)
CSN: [id number]     Arrival date & time 01/02/15  0900 History   First MD Initiated Contact with Patient 01/02/15 [phone]     Chief Complaint  Patient presents with  . Fall  . Arm Injury     (Consider location/radiation/quality/duration/timing/severity/associated sxs/prior Treatment) HPI   58yF with L wrist pain. Fall shortly before arrival. Lost balance. [place]. Pain/deformity since. No numbness or tingling. Doesn't think hit head. No LOC. Denies significant pain aside from hand/wrist. No blood thinners.   Past Medical History  Diagnosis Date  . Hypertension   . Hypercholesterolemia   . Bronchitis   . Pancreatitis   . Complication of anesthesia 12/11/11    "angry, mean, hateful after" endoscopy & colonoscopy  . Asthma ~ 1991  . Pneumonia 06/2011; 07/2011  . Diabetes mellitus type 2, controlled   . GERD (gastroesophageal reflux disease)   . GI bleeding 12/21/11  . Hx of colonoscopy with polypectomy 12/11/11    "took out 7; couldn't get #8, that one was precancerous"  . Depression   . 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 Paralysis gastric   . Kidney stones   . Wears dentures     top  . Wears glasses   . Sleep apnea     uses a cpap-oxygen at night   Past Surgical History  Procedure Laterality Date  . Cesarean section  1981; 1986  . Esophagogastroduodenoscopy  12/11/2011    Procedure: ESOPHAGOGASTRODUODENOSCOPY (EGD);  Surgeon: Missy Sabins, MD;  Location: [facility] ENDOSCOPY;  Service: Endoscopy;  Laterality: N/A;  . Colonoscopy  12/11/2011    Procedure: COLONOSCOPY;  Surgeon: Missy Sabins, MD;  Location: [place];  Service: Endoscopy;  Laterality: N/A;  . Abdominal hysterectomy  2002  . Cholecystectomy  ~ 2002  . Tonsillectomy and adenoidectomy  1988  . Appendectomy  ~ 2002    "w/hysterectomy"  . Colonoscopy  04/26/2012    Procedure: COLONOSCOPY;  Surgeon: Wonda Horner, MD;  Location: [facility] ENDOSCOPY;  Service: Endoscopy;  Laterality: N/A;  apc  .  Hernia repair      Umbilical   Family History  Problem Relation Age of Onset  . Colon cancer Sister   . Alzheimer's disease Mother   . Heart disease Father     Enlarged heart   History  Substance Use Topics  . Smoking status: Former Smoker -- 0.12 packs/day for 6 years    Types: Cigarettes    Quit date: 12/09/1996  . Smokeless tobacco: Never Used  . Alcohol Use: No   OB History    No data available     Review of Systems  All systems reviewed and negative, other than as noted in HPI.   Allergies  Codeine; Penicillins; Reglan; Xifaxan; Zocor; and Crestor  Home Medications   Prior to Admission medications   Medication Sig Start Date End Date Taking? Authorizing Provider  acetaminophen (TYLENOL) 500 MG tablet Take 1,000 mg by mouth daily as needed for fever or headache (headache).    Yes Historical Provider, MD  amLODipine (NORVASC) 5 MG tablet Take 1 tablet (5 mg total) by mouth daily. 11/08/14  Yes Minus Breeding, MD  calcium carbonate (OS-CAL) 600 MG TABS tablet Take 600 mg by mouth daily.   Yes Historical Provider, MD  Cholecalciferol (VITAMIN D PO) Take 400 Units by mouth daily.    Yes Historical Provider, MD  Choline Fenofibrate (FENOFIBRIC ACID) 135 MG CPDR Take 135  mg by mouth at bedtime.   Yes Historical Provider, MD  clopidogrel (PLAVIX) 75 MG tablet Take 1 tablet (75 mg total) by mouth daily. 10/30/14  Yes Rosalin Hawking, MD  Coenzyme Q10 (CO Q-10) 100 MG CAPS Take 100 mg by mouth at bedtime.   Yes Historical Provider, MD  dicyclomine (BENTYL) 10 MG capsule Take 10 mg by mouth 4 (four) times daily -  before meals and at bedtime.   Yes Historical Provider, MD  fluticasone (FLONASE) 50 MCG/ACT nasal spray Place 2 sprays into both nostrils daily as needed for allergies.  11/12/14  Yes Historical Provider, MD  gabapentin (NEURONTIN) 300 MG capsule TAKE (2) CAPSULES THREE TIMES DAILY. 9AM, 3PM, 9PM 11/12/14  Yes Harriet Masson, DPM  glipiZIDE (GLUCOTROL XL) 5 MG 24 hr tablet Take 5  mg by mouth daily. 12/04/14 12/04/15 Yes Historical Provider, MD  hydrochlorothiazide (HYDRODIURIL) 25 MG tablet Take 25 mg by mouth daily.   Yes Historical Provider, MD  Insulin NPH Human, Isophane, (NOVOLIN N RELION [place]) Inject 44 Units into the skin at bedtime.   Yes Historical Provider, MD  LORazepam (ATIVAN) 0.5 MG tablet Take 0.5 mg by mouth 4 (four) times daily. FOR ANXIETY   Yes Historical Provider, MD  losartan (COZAAR) 100 MG tablet Take 100 mg by mouth daily.   Yes Historical Provider, MD  metFORMIN (GLUCOPHAGE) 1000 MG tablet Take 1,000 mg by mouth 2 (two) times daily with a meal.   Yes Historical Provider, MD  metoprolol tartrate (LOPRESSOR) 25 MG tablet Take 1 tablet (25 mg total) by mouth 2 (two) times daily. 10/03/14  Yes Orson Eva, MD  Multiple Vitamin (MULTIVITAMIN WITH MINERALS) TABS tablet Take 1 tablet by mouth daily.   Yes Historical Provider, MD  niacin 250 MG CR capsule Take 1 capsule (250 mg total) by mouth at bedtime. 10/03/14  Yes Orson Eva, MD  nortriptyline (PAMELOR) 50 MG capsule Take 50 mg by mouth at bedtime.   Yes Historical Provider, MD  ondansetron (ZOFRAN) 4 MG tablet Take 4 mg by mouth daily as needed for nausea or vomiting.   Yes Historical Provider, MD  Pancrelipase, Lip-Prot-Amyl, (CREON) 24000 UNITS CPEP Take 1 capsule by mouth 2 (two) times daily.    Yes Historical Provider, MD  pantoprazole (PROTONIX) 40 MG tablet Take 40 mg by mouth 2 (two) times daily.    Yes Historical Provider, MD  potassium chloride (K-DUR) 10 MEQ tablet Take 10 mEq by mouth every morning.   Yes Historical Provider, MD  pravastatin (PRAVACHOL) 80 MG tablet Take 80 mg by mouth daily. 07/30/14 07/30/15 Yes Historical Provider, MD  PROAIR HFA 108 (90 BASE) MCG/ACT inhaler USE 2 PUFFS EVERY 6 HOURS AS NEEDED FOR WHEEZING   Yes Deneise Lever, MD  Probiotic Product (ALIGN PO) Take 1 capsule by mouth daily.   Yes Historical Provider, MD  venlafaxine XR (EFFEXOR-XR) 75 MG 24 hr capsule Take 75 mg  by mouth 3 (three) times daily.   Yes Historical Provider, MD  nortriptyline (PAMELOR) 25 MG capsule Take 1 capsule (25 mg total) by mouth at bedtime. Patient not taking: Reported on 01/02/2015 11/06/14   Rosalin Hawking, MD  oxyCODONE-acetaminophen (PERCOCET/ROXICET) 5-325 MG per tablet Take 1-2 tablets by mouth every 4 (four) hours as needed for severe pain. 01/02/15   Virgel Manifold, MD   BP 117/67 mmHg  Pulse 91  Temp(Src) 97.7 F (36.5 C) (Oral)  Resp 18  SpO2 91% Physical Exam  Constitutional: She appears well-developed and well-nourished.  No distress.  HENT:  Head: Normocephalic and atraumatic.  Eyes: Conjunctivae are normal. Right eye exhibits no discharge. Left eye exhibits no discharge.  Neck: Neck supple.  Cardiovascular: Normal rate, regular rhythm and normal heart sounds.  Exam reveals no gallop and no friction rub.   No murmur heard. Pulmonary/Chest: Effort normal and breath sounds normal. No respiratory distress.  Abdominal: Soft. She exhibits no distension. There is no tenderness.  Musculoskeletal: She exhibits no edema or tenderness.  Deformity L wrist. Exquisite TTP. Cannot range wrist 2/2 pain. Can move fingers. Palpable radial pulse. Cap refill in finger brisk. Sensation intact to light touch. Closed injury.   Neurological: She is alert.  Skin: Skin is warm and dry.  Psychiatric: She has a normal mood and affect. Her behavior is normal. Thought content normal.  Nursing note and vitals reviewed.   ED Course  Procedures (including critical care time) Labs Review Labs Reviewed - No data to display  Imaging Review No results found.   Dg Forearm Left  01/02/2015   CLINICAL DATA:  Wrist pain and deformity after falling down steps this morning at the patient's son's home.  EXAM: LEFT FOREARM - 2 VIEW  COMPARISON:  09/21/2013  FINDINGS: There is a markedly comminuted impacted angulated fracture of the distal radius. The fracture extends 4 cm proximally and does involve the  articular surface of the distal radius. There is also a comminuted displaced fracture of the ulnar styloid. The proximal radius and ulna are normal.  IMPRESSION: Comminuted fractures of the distal left radius and ulna as described.   Electronically Signed   By: Lorriane Shire M.D.   On: 01/02/2015 10:17   Dg Wrist Complete Left  01/02/2015   CLINICAL DATA:  Fall this a.m.  Pain.  Initial evaluation.  EXAM: LEFT WRIST - COMPLETE 3+ VIEW  COMPARISON:  None.  FINDINGS: Comminuted distal left radial fracture is noted with severe displacement and angulation deformity. The fractures extend into the radiocarpal joint space. Ulnar styloid displaced fracture is also noted.  IMPRESSION: Severe angulated displaced comminuted distal radial fracture with extension into the radiocarpal joint space. Associated ulnar styloid fracture present.   Electronically Signed   By: Marcello Moores  Register   On: 01/02/2015 10:15   Dg Hand Complete Left  01/02/2015   CLINICAL DATA:  Patient fell down steps with pain and deformity  EXAM: LEFT HAND - COMPLETE 3+ VIEW  COMPARISON:  September 21, 2013  FINDINGS: Frontal, oblique, and lateral views were obtained. There is a comminuted fracture of the distal radius with multiple impacted fracture fragments in the radial metaphysis region. Fracture fragments extending into the radiocarpal joint. There is widening of the surface of the distal radius due to the fracture. A fracture fragment extends from the metaphysis into the distal diaphysis as well. There is avulsion of the ulnar styloid. There is no gross dislocation. There is moderate narrowing of all MCP, PIP, and DIP joints. No dislocation. No erosive change or periostitis.  IMPRESSION: Comminuted fracture distal radius with impacted fracture fragments. Avulsion ulnar styloid. No dislocation. Narrowing of essentially all MCP, PIP, and DIP joints.   Electronically Signed   By: Lowella Grip III M.D.   On: 01/02/2015 10:16    EKG  Interpretation None      MDM   Final diagnoses:  Closed fracture distal radius and ulna, left, initial encounter    58yF with closed wrist fx. NVI. Splinted. Can be seen by hand today. PRN pain meds.  Virgel Manifold, MD 01/05/15 1341

## 2015-01-07 ENCOUNTER — Ambulatory Visit (HOSPITAL_BASED_OUTPATIENT_CLINIC_OR_DEPARTMENT_OTHER)
Admission: RE | Admit: 2015-01-07 | Discharge: 2015-01-07 | Disposition: A | Discharge status: Home / Self Care | Payer: Medicare Other | Source: Ambulatory Visit | Attending: Orthopedic Surgery | Admitting: Orthopedic Surgery

## 2015-01-07 ENCOUNTER — Ambulatory Visit (HOSPITAL_COMMUNITY): Payer: Medicare Other

## 2015-01-07 ENCOUNTER — Encounter (HOSPITAL_BASED_OUTPATIENT_CLINIC_OR_DEPARTMENT_OTHER): Payer: Self-pay

## 2015-01-07 ENCOUNTER — Encounter (HOSPITAL_BASED_OUTPATIENT_CLINIC_OR_DEPARTMENT_OTHER)
Admission: RE | Disposition: A | Discharge status: Home / Self Care | Payer: Self-pay | Source: Ambulatory Visit | Attending: Orthopedic Surgery

## 2015-01-07 ENCOUNTER — Ambulatory Visit (HOSPITAL_BASED_OUTPATIENT_CLINIC_OR_DEPARTMENT_OTHER): Payer: Medicare Other | Admitting: Anesthesiology

## 2015-01-07 DIAGNOSIS — F329 Major depressive disorder, single episode, unspecified: Secondary | ICD-10-CM | POA: Diagnosis not present

## 2015-01-07 DIAGNOSIS — Z886 Allergy status to analgesic agent status: Secondary | ICD-10-CM | POA: Diagnosis not present

## 2015-01-07 DIAGNOSIS — S52572A Other intraarticular fracture of lower end of left radius, initial encounter for closed fracture: Secondary | ICD-10-CM | POA: Insufficient documentation

## 2015-01-07 DIAGNOSIS — Z4789 Encounter for other orthopedic aftercare: Secondary | ICD-10-CM

## 2015-01-07 DIAGNOSIS — E780 Pure hypercholesterolemia: Secondary | ICD-10-CM | POA: Diagnosis not present

## 2015-01-07 DIAGNOSIS — Z880 Allergy status to penicillin: Secondary | ICD-10-CM | POA: Diagnosis not present

## 2015-01-07 DIAGNOSIS — I10 Essential (primary) hypertension: Secondary | ICD-10-CM | POA: Insufficient documentation

## 2015-01-07 DIAGNOSIS — W010XXA Fall on same level from slipping, tripping and stumbling without subsequent striking against object, initial encounter: Secondary | ICD-10-CM | POA: Insufficient documentation

## 2015-01-07 DIAGNOSIS — J45909 Unspecified asthma, uncomplicated: Secondary | ICD-10-CM | POA: Insufficient documentation

## 2015-01-07 DIAGNOSIS — E119 Type 2 diabetes mellitus without complications: Secondary | ICD-10-CM | POA: Diagnosis not present

## 2015-01-07 DIAGNOSIS — K219 Gastro-esophageal reflux disease without esophagitis: Secondary | ICD-10-CM | POA: Insufficient documentation

## 2015-01-07 HISTORY — DX: Sleep apnea, unspecified: G47.30

## 2015-01-07 HISTORY — PX: OPEN REDUCTION INTERNAL FIXATION (ORIF) DISTAL RADIAL FRACTURE: SHX5989

## 2015-01-07 LAB — GLUCOSE, CAPILLARY: GLUCOSE-CAPILLARY: 158 mg/dL — AB (ref 70–99)

## 2015-01-07 SURGERY — OPEN REDUCTION INTERNAL FIXATION (ORIF) DISTAL RADIUS FRACTURE
Anesthesia: Regional | Site: Wrist | Laterality: Left

## 2015-01-07 MED ORDER — FENTANYL CITRATE 0.05 MG/ML IJ SOLN
INTRAMUSCULAR | Status: AC
  Filled 2015-01-07: qty 6

## 2015-01-07 MED ORDER — VANCOMYCIN HCL IN DEXTROSE 1-5 GM/200ML-% IV SOLN
1000.0000 mg | INTRAVENOUS | Status: AC
  Administered 2015-01-07 (×2): 1000 mg via INTRAVENOUS

## 2015-01-07 MED ORDER — MIDAZOLAM HCL 2 MG/2ML IJ SOLN
INTRAMUSCULAR | Status: AC
  Filled 2015-01-07: qty 2

## 2015-01-07 MED ORDER — EPHEDRINE SULFATE 50 MG/ML IJ SOLN
INTRAMUSCULAR | Status: DC | PRN
  Administered 2015-01-07: 10 mg via INTRAVENOUS

## 2015-01-07 MED ORDER — FENTANYL CITRATE 0.05 MG/ML IJ SOLN
INTRAMUSCULAR | Status: AC
  Filled 2015-01-07: qty 2

## 2015-01-07 MED ORDER — HYDROMORPHONE HCL 1 MG/ML IJ SOLN: 0.2500 mg | INTRAMUSCULAR | Status: DC | PRN

## 2015-01-07 MED ORDER — MIDAZOLAM HCL 2 MG/2ML IJ SOLN
1.0000 mg | INTRAMUSCULAR | Status: DC | PRN
  Administered 2015-01-07: 1 mg via INTRAVENOUS

## 2015-01-07 MED ORDER — LIDOCAINE HCL (CARDIAC) 20 MG/ML IV SOLN
INTRAVENOUS | Status: DC | PRN
  Administered 2015-01-07: 30 mg via INTRAVENOUS

## 2015-01-07 MED ORDER — FENTANYL CITRATE 0.05 MG/ML IJ SOLN
50.0000 ug | INTRAMUSCULAR | Status: DC | PRN
  Administered 2015-01-07: 50 ug via INTRAVENOUS

## 2015-01-07 MED ORDER — LACTATED RINGERS IV SOLN
INTRAVENOUS | Status: DC
Start: 2015-01-07 — End: 2015-01-07
  Administered 2015-01-07 (×2): via INTRAVENOUS

## 2015-01-07 MED ORDER — DEXAMETHASONE SODIUM PHOSPHATE 10 MG/ML IJ SOLN
INTRAMUSCULAR | Status: DC | PRN
  Administered 2015-01-07: 4 mg via INTRAVENOUS

## 2015-01-07 MED ORDER — LACTATED RINGERS IV SOLN: INTRAVENOUS | Status: DC

## 2015-01-07 MED ORDER — ONDANSETRON HCL 4 MG/2ML IJ SOLN
INTRAMUSCULAR | Status: DC | PRN
  Administered 2015-01-07: 4 mg via INTRAVENOUS

## 2015-01-07 MED ORDER — PROPOFOL 10 MG/ML IV BOLUS
INTRAVENOUS | Status: DC | PRN
  Administered 2015-01-07: 120 mg via INTRAVENOUS

## 2015-01-07 MED ORDER — MIDAZOLAM HCL 5 MG/5ML IJ SOLN
INTRAMUSCULAR | Status: DC | PRN
  Administered 2015-01-07: 1 mg via INTRAVENOUS

## 2015-01-07 MED ORDER — VANCOMYCIN HCL IN DEXTROSE 1-5 GM/200ML-% IV SOLN
INTRAVENOUS | Status: AC
  Filled 2015-01-07: qty 200

## 2015-01-07 MED ORDER — BUPIVACAINE-EPINEPHRINE (PF) 0.5% -1:200000 IJ SOLN
INTRAMUSCULAR | Status: DC | PRN
  Administered 2015-01-07: 30 mL via PERINEURAL

## 2015-01-07 SURGICAL SUPPLY — 65 items
BANDAGE COBAN STERILE 2 (GAUZE/BANDAGES/DRESSINGS) IMPLANT
BIT DRILL 2 FAST STEP (BIT) ×3 IMPLANT
BIT DRILL 2.5X4 QC (BIT) ×3 IMPLANT
BLADE MINI RND TIP GREEN BEAV (BLADE) IMPLANT
BLADE SURG 15 STRL LF DISP TIS (BLADE) ×1 IMPLANT
BLADE SURG 15 STRL SS (BLADE) ×2
BNDG COHESIVE 4X5 TAN STRL (GAUZE/BANDAGES/DRESSINGS) ×3 IMPLANT
BNDG ESMARK 4X9 LF (GAUZE/BANDAGES/DRESSINGS) ×3 IMPLANT
BNDG GAUZE ELAST 4 BULKY (GAUZE/BANDAGES/DRESSINGS) ×6 IMPLANT
BRUSH SCRUB EZ PLAIN DRY (MISCELLANEOUS) ×3 IMPLANT
CANISTER SUCT 1200ML W/VALVE (MISCELLANEOUS) ×3 IMPLANT
CHLORAPREP W/TINT 26ML (MISCELLANEOUS) ×3 IMPLANT
CORDS BIPOLAR (ELECTRODE) ×3 IMPLANT
COVER BACK TABLE 60X90IN (DRAPES) ×3 IMPLANT
COVER MAYO STAND STRL (DRAPES) ×3 IMPLANT
CUFF TOURNIQUET SINGLE 18IN (TOURNIQUET CUFF) ×3 IMPLANT
CUFF TOURNIQUET SINGLE 24IN (TOURNIQUET CUFF) IMPLANT
DRAPE C-ARM 42X72 X-RAY (DRAPES) ×3 IMPLANT
DRAPE EXTREMITY T 121X128X90 (DRAPE) ×3 IMPLANT
DRAPE SURG 17X23 STRL (DRAPES) ×3 IMPLANT
DRSG ADAPTIC 3X8 NADH LF (GAUZE/BANDAGES/DRESSINGS) ×3 IMPLANT
DRSG EMULSION OIL 3X3 NADH (GAUZE/BANDAGES/DRESSINGS) ×3 IMPLANT
ELECT REM PT RETURN 9FT ADLT (ELECTROSURGICAL) ×3
ELECTRODE REM PT RTRN 9FT ADLT (ELECTROSURGICAL) ×1 IMPLANT
GAUZE SPONGE 4X4 12PLY STRL (GAUZE/BANDAGES/DRESSINGS) ×3 IMPLANT
GLOVE BIO SURGEON STRL SZ7.5 (GLOVE) ×3 IMPLANT
GLOVE BIOGEL PI IND STRL 7.0 (GLOVE) ×1 IMPLANT
GLOVE BIOGEL PI IND STRL 8 (GLOVE) ×1 IMPLANT
GLOVE BIOGEL PI INDICATOR 7.0 (GLOVE) ×2
GLOVE BIOGEL PI INDICATOR 8 (GLOVE) ×2
GLOVE ECLIPSE 6.5 STRL STRAW (GLOVE) ×3 IMPLANT
GOWN STRL REUS W/ TWL LRG LVL3 (GOWN DISPOSABLE) ×2 IMPLANT
GOWN STRL REUS W/TWL LRG LVL3 (GOWN DISPOSABLE) ×4
GOWN STRL REUS W/TWL XL LVL3 (GOWN DISPOSABLE) ×3 IMPLANT
NEEDLE HYPO 25X1 1.5 SAFETY (NEEDLE) IMPLANT
NS IRRIG 1000ML POUR BTL (IV SOLUTION) ×3 IMPLANT
PACK BASIN DAY SURGERY FS (CUSTOM PROCEDURE TRAY) ×3 IMPLANT
PADDING CAST ABS 4INX4YD NS (CAST SUPPLIES) ×2
PADDING CAST ABS COTTON 4X4 ST (CAST SUPPLIES) ×1 IMPLANT
PEG SUBCHONDRAL SMOOTH 2.0X18 (Peg) ×3 IMPLANT
PEG SUBCHONDRAL SMOOTH 2.0X20 (Peg) ×3 IMPLANT
PEG SUBCHONDRAL SMOOTH 2.0X22 (Peg) ×9 IMPLANT
PEG SUBCHONDRAL SMOOTH 2.0X24 (Peg) ×3 IMPLANT
PEG THREADED 2.5MMX24MM LONG (Peg) ×3 IMPLANT
PENCIL BUTTON HOLSTER BLD 10FT (ELECTRODE) ×3 IMPLANT
PLATE STAN 24.4X59.5 LT (Plate) ×3 IMPLANT
RUBBERBAND STERILE (MISCELLANEOUS) IMPLANT
SCREW BN 12X3.5XNS CORT TI (Screw) ×1 IMPLANT
SCREW CORT 3.5X12 (Screw) ×2 IMPLANT
SCREW CORT 3.5X14 LNG (Screw) ×9 IMPLANT
SLEEVE SCD COMPRESS KNEE MED (MISCELLANEOUS) ×3 IMPLANT
STOCKINETTE 6  STRL (DRAPES) ×2
STOCKINETTE 6 STRL (DRAPES) ×1 IMPLANT
SUCTION FRAZIER TIP 10 FR DISP (SUCTIONS) ×3 IMPLANT
SUT VIC AB 2-0 PS2 27 (SUTURE) ×3 IMPLANT
SUT VICRYL 4-0 PS2 18IN ABS (SUTURE) IMPLANT
SUT VICRYL RAPIDE 4-0 (SUTURE) IMPLANT
SUT VICRYL RAPIDE 4/0 PS 2 (SUTURE) ×3 IMPLANT
SYR BULB 3OZ (MISCELLANEOUS) ×3 IMPLANT
SYRINGE 10CC LL (SYRINGE) IMPLANT
TOWEL OR 17X24 6PK STRL BLUE (TOWEL DISPOSABLE) ×3 IMPLANT
TOWEL OR NON WOVEN STRL DISP B (DISPOSABLE) ×3 IMPLANT
TUBE CONNECTING 20'X1/4 (TUBING) ×1
TUBE CONNECTING 20X1/4 (TUBING) ×2 IMPLANT
UNDERPAD 30X30 INCONTINENT (UNDERPADS AND DIAPERS) ×3 IMPLANT

## 2015-01-07 NOTE — Op Note (Signed)
01/07/2015  8:21 AM  PATIENT:  Destiny Robinson  59 y.o. female  PRE-OPERATIVE DIAGNOSIS:  Comminuted displaced left intra-articular distal radius fracture  POST-OPERATIVE DIAGNOSIS:  Same  PROCEDURE:  ORIF comminuted displaced left intra-articular distal radius fracture  SURGEON: Rayvon Char. Grandville Silos, MD  PHYSICIAN ASSISTANT:  Morley Kos, OPA-C  ANESTHESIA:  regional and general  SPECIMENS:  None  DRAINS: None  EBL:  less than 50 mL  PREOPERATIVE INDICATIONS:  Destiny Robinson is a  58 y.o. female with a comminuted displaced left distal radius fracture.  The risks benefits and alternatives were discussed with the patient preoperatively including but not limited to the risks of infection, bleeding, nerve injury, cardiopulmonary complications, the need for revision surgery, among others, and the patient verbalized understanding and consented to proceed.  OPERATIVE IMPLANTS: Biomet DVR 4-hole standard plate and appropriate matching screw/pegs.  OPERATIVE PROCEDURE: After receiving prophylactic antibiotics and a regional block, the patient was escorted to the operative theatre and placed in a supine position. Gen. Anesthesia was administered.  A surgical "time-out" was performed during which the planned procedure, proposed operative site, and the correct patient identity were compared to the operative consent and agreement confirmed by the circulating nurse according to current facility policy. Following application of a tourniquet to the operative extremity, the exposed skin was pre-scrub with Hibiclens scrub brush and then was prepped with Chloraprep and draped in the usual sterile fashion. The limb was exsanguinated with an Esmarch bandage and the tourniquet inflated to approximately 160mmHg higher than systolic BP.   A sinusoidal-shaped incision was marked and made over the FCR axis and the distal forearm. The skin was incised sharply with scalpel, subcutaneous tissues with blunt and  spreading dissection. The FCR axis was exploited deeply. The pronator quadratus was reflected in an L-shaped ulnarly and the brachioradialis was split in a Z-plasty fashion for later reapproximation. The fracture was inspected and provisionally reduced.  This was confirmed fluoroscopically. The appropriately sized plate was selected and found to fit well. It was placed in its provisional alignment of the radius and this was confirmed fluoroscopically.  It was secured to the radius with a screw through the slotted hole.  Additional adjustments were made as necessary, including adjusting fragments with the use of a clamp and using some temporary fixation with additional K wire through plate holes. At first, I tried to reduce specifically the ulnar frame to the shaft and secured it, and then through use of a clamp direct the radial fragments towards the ulnar fragments. Ultimately, it was better reduction of the radial fragments and so fixation was taken out of the ulnar side, the ulnar side readjusted particularly elevating a portion articular surface and then the ulnar side secured again.  Peg/screw length distally was selected on the shorter side of measurements to minimize the risk for dorsal cortical penetration. The remainder of the proximal holes were drilled and filled.   Final images were obtained and the DRUJ was examined for stability. It was found to be sufficiently stable. The wound was then copiously irrigated and the brachioradialis repaired with 2-0 Vicryl Rapide suture followed by repair of the pronator quadratus with the same suture type. Tourniquet was released and additional hemostasis obtained and the skin was closed with 2-0 Vicryl deep dermal buried sutures followed by running 4-0 Vicryl Rapide horizontal mattress suture in the skin. A bulky dressing with a volar plaster component was applied and she was taken to room stable condition.  DISPOSITION: The  patient will be discharged home today  with typical post-op instructions, returning in 10-15 days for reevaluation with new x-rays of the affected wrist out of the splint to include an inclined lateral and then transition to therapy to have a custom splint constructed and begin rehabilitation.

## 2015-01-07 NOTE — Anesthesia Postprocedure Evaluation (Signed)
  Anesthesia Post-op Note  Patient: Destiny Robinson  Procedure(s) Performed: Procedure(s): OPEN REDUCTION INTERNAL FIXATION (ORIF) LEFT DISTAL RADIAL FRACTURE (Left)  Patient Location: PACU  Anesthesia Type:General and block  Level of Consciousness: awake and alert   Airway and Oxygen Therapy: Patient Spontanous Breathing  Post-op Pain: none  Post-op Assessment: Post-op Vital signs reviewed, Patient's Cardiovascular Status Stable and Respiratory Function Stable  Post-op Vital Signs: Reviewed  Filed Vitals:   01/07/15 1412  BP:   Pulse: 107  Temp:   Resp:     Complications: No apparent anesthesia complications

## 2015-01-07 NOTE — Interval H&P Note (Signed)
History and Physical Interval Note:  01/07/2015 8:18 AM  Destiny Robinson  has presented today for surgery, with the diagnosis of left distal radius fracture  The various methods of treatment have been discussed with the patient and family. After consideration of risks, benefits and other options for treatment, the patient has consented to  Procedure(s): OPEN REDUCTION INTERNAL FIXATION (ORIF) LEFT DISTAL RADIAL FRACTURE (Left) as a surgical intervention .  The patient's history has been reviewed, patient examined, no change in status, stable for surgery.  I have reviewed the patient's chart and labs.  Questions were answered to the patient's satisfaction.     Trooper Olander A.

## 2015-01-07 NOTE — Anesthesia Procedure Notes (Addendum)
Anesthesia Regional Block:  Supraclavicular block  Pre-Anesthetic Checklist: ,, timeout performed, Correct Patient, Correct Site, Correct Laterality, Correct Procedure, Correct Position, site marked, Risks and benefits discussed, pre-op evaluation, post-op pain management  Laterality: Left  Prep: Maximum Sterile Barrier Precautions used and chloraprep       Needles:  Injection technique: Single-shot  Needle Type: Echogenic Stimulator Needle     Needle Length: 5cm 5 cm Needle Gauge: 22 and 22 G    Additional Needles:  Procedures: ultrasound guided (picture in chart) Supraclavicular block Narrative:  Start time: 01/07/2015 9:00 AM End time: 01/07/2015 9:13 AM Injection made incrementally with aspirations every 5 mL. Anesthesiologist: Roderic Palau  Additional Notes: 2% Lidocaine skin wheel.   Procedure Name: LMA Insertion Date/Time: 01/07/2015 10:10 AM Performed by: Adyan Palau D Pre-anesthesia Checklist: Patient identified, Emergency Drugs available, Suction available and Patient being monitored Patient Re-evaluated:Patient Re-evaluated prior to inductionOxygen Delivery Method: Circle System Utilized Preoxygenation: Pre-oxygenation with 100% oxygen Intubation Type: IV induction Ventilation: Mask ventilation without difficulty LMA: LMA inserted LMA Size: 4.0 Number of attempts: 1 Airway Equipment and Method: Bite block Placement Confirmation: positive ETCO2 Tube secured with: Tape Dental Injury: Teeth and Oropharynx as per pre-operative assessment

## 2015-01-07 NOTE — Transfer of Care (Signed)
Immediate Anesthesia Transfer of Care Note  Patient: Destiny Robinson  Procedure(s) Performed: Procedure(s): OPEN REDUCTION INTERNAL FIXATION (ORIF) LEFT DISTAL RADIAL FRACTURE (Left)  Patient Location: PACU  Anesthesia Type:GA combined with regional for post-op pain  Level of Consciousness: awake, alert , oriented and patient cooperative  Airway & Oxygen Therapy: Patient Spontanous Breathing and Patient connected to face mask oxygen  Post-op Assessment: Report given to RN and Post -op Vital signs reviewed and stable  Post vital signs: Reviewed and stable  Last Vitals:  Filed Vitals:   01/07/15 0945  BP: 148/78  Pulse: 109  Resp: 14    Complications: No apparent anesthesia complications

## 2015-01-07 NOTE — Discharge Instructions (Addendum)
Discharge Instructions ° ° °You have a dressing with a plaster splint incorporated in it. °Move your fingers as much as possible, making a full fist and fully opening the fist. °Elevate your hand to reduce pain & swelling of the digits.  Ice over the operative site may be helpful to reduce pain & swelling.  DO NOT USE HEAT. °Pain medicine has been prescribed for you.  °Use your medicine as needed over the first 48 hours, and then you can begin to taper your use.  You may use Tylenol in place of your prescribed pain medication, but not IN ADDITION to it. °Leave the dressing in place until you return to our office.  °You may shower, but keep the bandage clean & dry.  °You may drive a car when you are off of prescription pain medications and can safely control your vehicle with both hands. °Our office will call you to arrange follow-up ° ° °Please call [phone] during normal business hours or [phone] after hours for any problems. Including the following: ° °- excessive redness of the incisions °- drainage for more than 4 days °- fever of more than 101.5 F ° °*Please note that pain medications will not be refilled after hours or on weekends. ° ° °Post Anesthesia Home Care Instructions ° °Activity: °Get plenty of rest for the remainder of the day. A responsible adult should stay with you for 24 hours following the procedure.  °For the next 24 hours, DO NOT: °-Drive a car °-Operate machinery °-Drink alcoholic beverages °-Take any medication unless instructed by your physician °-Make any legal decisions or sign important papers. ° °Meals: °Start with liquid foods such as gelatin or soup. Progress to regular foods as tolerated. Avoid greasy, spicy, heavy foods. If nausea and/or vomiting occur, drink only clear liquids until the nausea and/or vomiting subsides. Call your physician if vomiting continues. ° °Special Instructions/Symptoms: °Your throat may feel dry or sore from the anesthesia or the breathing tube  placed in your throat during surgery. If this causes discomfort, gargle with warm salt water. The discomfort should disappear within 24 hours. ° °If you had a scopolamine patch placed behind your ear for the management of post- operative nausea and/or vomiting: ° °1. The medication in the patch is effective for 72 hours, after which it should be removed.  Wrap patch in a tissue and discard in the trash. Wash hands thoroughly with soap and water. °2. You may remove the patch earlier than 72 hours if you experience unpleasant side effects which may include dry mouth, dizziness or visual disturbances. °3. Avoid touching the patch. Wash your hands with soap and water after contact with the patch. °  °Regional Anesthesia Blocks ° °1. Numbness or the inability to move the "blocked" extremity may last from 3-48 hours after placement. The length of time depends on the medication injected and your individual response to the medication. If the numbness is not going away after 48 hours, call your surgeon. ° °2. The extremity that is blocked will need to be protected until the numbness is gone and the  Strength has returned. Because you cannot feel it, you will need to take extra care to avoid injury. Because it may be weak, you may have difficulty moving it or using it. You may not know what position it is in without looking at it while the block is in effect. ° °3. For blocks in the legs and feet, returning to weight bearing and walking needs to be   done carefully. You will need to wait until the numbness is entirely gone and the strength has returned. You should be able to move your leg and foot normally before you try and bear weight or walk. You will need someone to be with you when you first try to ensure you do not fall and possibly risk injury. ° °4. Bruising and tenderness at the needle site are common side effects and will resolve in a few days. ° °5. Persistent numbness or new problems with movement should be  communicated to the surgeon or the [facility] ([phone])/  Surgery Center ([phone]). ° °

## 2015-01-07 NOTE — Anesthesia Preprocedure Evaluation (Addendum)
Anesthesia Evaluation  Patient identified by MRN, date of birth, ID band Patient awake    Reviewed: Allergy & Precautions, H&P , NPO status , Patient's Chart, lab work & pertinent test results, reviewed documented beta blocker date and time   History of Anesthesia Complications (+) Emergence Delirium  Airway Mallampati: II  TM Distance: >3 FB Neck ROM: Full    Dental no notable dental hx. (+) Upper Dentures, Dental Advisory Given, Partial Lower   Pulmonary asthma , sleep apnea and Continuous Positive Airway Pressure Ventilation , former smoker,  breath sounds clear to auscultation  Pulmonary exam normal       Cardiovascular hypertension, Pt. on medications and Pt. on home beta blockers Rhythm:Regular Rate:Normal     Neuro/Psych  Headaches, Depression CVA, No Residual Symptoms    GI/Hepatic Neg liver ROS, GERD-  ,  Endo/Other  diabetes, Insulin DependentMorbid obesity  Renal/GU negative Renal ROS  negative genitourinary   Musculoskeletal   Abdominal   Peds  Hematology negative hematology ROS (+)   Anesthesia Other Findings   Reproductive/Obstetrics negative OB ROS                            Anesthesia Physical Anesthesia Plan  ASA: III  Anesthesia Plan: General and Regional   Post-op Pain Management:    Induction: Intravenous  Airway Management Planned: LMA  Additional Equipment:   Intra-op Plan:   Post-operative Plan: Extubation in OR  Informed Consent: I have reviewed the patients History and Physical, chart, labs and discussed the procedure including the risks, benefits and alternatives for the proposed anesthesia with the patient or authorized representative who has indicated his/her understanding and acceptance.   Dental advisory given  Plan Discussed with: CRNA  Anesthesia Plan Comments:         Anesthesia Quick Evaluation

## 2015-01-07 NOTE — Progress Notes (Signed)
Assisted Dr. Edmond Fitzgerald with left, ultrasound guided, supraclavicular block. Side rails up, monitors on throughout procedure. See vital signs in flow sheet. Tolerated Procedure well. 

## 2015-01-09 ENCOUNTER — Encounter (HOSPITAL_BASED_OUTPATIENT_CLINIC_OR_DEPARTMENT_OTHER): Payer: Self-pay | Admitting: Orthopedic Surgery

## 2015-01-10 LAB — POCT I-STAT, CHEM 8
BUN: 10 mg/dL (ref 6–23)
CALCIUM ION: 1.04 mmol/L — AB (ref 1.12–1.23)
CHLORIDE: 95 mmol/L — AB (ref 96–112)
CREATININE: 0.6 mg/dL (ref 0.50–1.10)
Glucose, Bld: 170 mg/dL — ABNORMAL HIGH (ref 70–99)
HCT: 42 % (ref 36.0–46.0)
Hemoglobin: 14.3 g/dL (ref 12.0–15.0)
Potassium: 3.4 mmol/L — ABNORMAL LOW (ref 3.5–5.1)
Sodium: 140 mmol/L (ref 135–145)
TCO2: 31 mmol/L (ref 0–100)

## 2015-01-22 ENCOUNTER — Ambulatory Visit: Payer: Medicare Other

## 2015-01-24 ENCOUNTER — Ambulatory Visit: Payer: Medicare Other

## 2015-01-29 ENCOUNTER — Encounter: Payer: Self-pay | Admitting: Internal Medicine

## 2015-02-08 ENCOUNTER — Ambulatory Visit (INDEPENDENT_AMBULATORY_CARE_PROVIDER_SITE_OTHER): Payer: Medicare Other

## 2015-02-08 ENCOUNTER — Ambulatory Visit: Payer: Medicare Other

## 2015-02-08 VITALS — BP 156/89 | HR 91 | Resp 13 | Ht 62.0 in | Wt 188.0 lb

## 2015-02-08 DIAGNOSIS — Z9889 Other specified postprocedural states: Secondary | ICD-10-CM

## 2015-02-08 NOTE — Progress Notes (Signed)
   Subjective:    Patient ID: Destiny Robinson, female    DOB: [birth year], 59 y.o.   MRN: [id number]  HPI    Review of Systems  Musculoskeletal:       Pt fractured left wrist 12/2014.  All other systems reviewed and are negative.      Objective:   Physical Exam        Assessment & Plan:

## 2015-02-08 NOTE — Progress Notes (Deleted)
HPI Presents today chief complaint of painful elongated toenails.  Objective: Pulses are palpable bilateral nails are thick, yellow dystrophic onychomycosis and painful palpation.   Assessment: Onychomycosis with pain in limb.  Plan: Treatment of nails in thickness and length as covered service secondary to pain.  

## 2015-03-05 ENCOUNTER — Other Ambulatory Visit: Payer: Self-pay

## 2015-03-05 ENCOUNTER — Other Ambulatory Visit: Payer: Self-pay | Admitting: Neurology

## 2015-03-05 NOTE — Telephone Encounter (Signed)
Pt needs to make an appt to become established with another doctor, prior to future refills.

## 2015-03-15 ENCOUNTER — Ambulatory Visit (INDEPENDENT_AMBULATORY_CARE_PROVIDER_SITE_OTHER): Payer: Medicare Other | Admitting: Cardiology

## 2015-03-15 ENCOUNTER — Encounter: Payer: Self-pay | Admitting: Cardiology

## 2015-03-15 VITALS — BP 128/79 | HR 92 | Ht 62.0 in | Wt 194.0 lb

## 2015-03-15 DIAGNOSIS — R072 Precordial pain: Secondary | ICD-10-CM

## 2015-03-15 NOTE — Patient Instructions (Signed)
Your physician recommends that you schedule a follow-up appointment As Needed  

## 2015-03-15 NOTE — Progress Notes (Signed)
Cardiology Office Note   Date:  03/15/2015   ID:  Destiny Robinson, DOB [birth year], MRN [id number]  PCP:  [name], MD  Cardiologist:   Minus Breeding, MD   Chief Complaint  Patient presents with  . Follow-up      History of Present Illness: Destiny Robinson is a 59 y.o. female who presents for evaluation of a reduced ejection fraction and chest pain.  I saw her in 2002. She had chest pain at that time but had normal coronaries on catheterization. She's had recently hospitalization for TIAs and she reports a stroke.  Of note her ejection fraction in late December demonstrated an EF of 45-50% which was new compared to the normal EF several years ago. She had no significant carotid stenosis.  She was having some chest discomfort at the last visit but a stress perfusion study was negative. EF was 54%.  Since I saw her she had surgical repair of a radial fracture.  She's had no new cardiovascular complaints since I saw her. The patient denies any new symptoms such as chest discomfort, neck or arm discomfort. There has been no new shortness of breath, PND or orthopnea. There have been no reported palpitations, presyncope or syncope.  She does get chronic dyspnea is seen her lung doctors this.   Past Medical History  Diagnosis Date  . Hypertension   . Hypercholesterolemia   . Bronchitis   . Pancreatitis   . Complication of anesthesia 12/11/11    "angry, mean, hateful after" endoscopy & colonoscopy  . Asthma ~ 1991  . Pneumonia 06/2011; 07/2011  . Diabetes mellitus type 2, controlled   . GERD (gastroesophageal reflux disease)   . GI bleeding 12/21/11  . Hx of colonoscopy with polypectomy 12/11/11    "took out 7; couldn't get #8, that one was precancerous"  . Depression   . 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 Paralysis gastric   . Kidney stones   . Wears dentures     top  . Wears glasses   . Sleep apnea     uses a cpap-oxygen at night     Past Surgical History  Procedure Laterality Date  . Cesarean section  1981; 1986  . Esophagogastroduodenoscopy  12/11/2011    Procedure: ESOPHAGOGASTRODUODENOSCOPY (EGD);  Surgeon: Missy Sabins, MD;  Location[facility] ENDOSCOPY;  Service: Endoscopy;  Laterality: N/A;  . Colonoscopy  12/11/2011    Procedure: COLONOSCOPY;  Surgeon: Missy Sabins, MD;  Location: [facility];  Service: Endoscopy;  Laterality: N/A;  . Abdominal hysterectomy  2002  . Cholecystectomy  ~ 2002  . Tonsillectomy and adenoidectomy  1988  . Appendectomy  ~ 2002    "w/hysterectomy"  . Colonoscopy  04/26/2012    Procedure: COLONOSCOPY;  Surgeon: Wonda Horner, MD;  Location: [facility] ENDOSCOPY;  Service: Endoscopy;  Laterality: N/A;  apc  . Hernia repair      Umbilical  . Open reduction internal fixation (orif) distal radial fracture Left 01/07/2015    Procedure: OPEN REDUCTION INTERNAL FIXATION (ORIF) LEFT DISTAL RADIAL FRACTURE;  Surgeon: Milly Jakob, MD;  Location: [facility];  Service: Orthopedics;  Laterality: Left;     Current Outpatient Prescriptions  Medication Sig Dispense Refill  . acetaminophen (TYLENOL) 500 MG tablet Take 1,000 mg by mouth daily as needed for fever or headache (headache).     Marland Kitchen amLODipine (NORVASC) 5 MG tablet Take 1 tablet (5  mg total) by mouth daily. 180 tablet 3  . calcium carbonate (OS-CAL) 600 MG TABS tablet Take 600 mg by mouth daily.    . Cholecalciferol (VITAMIN D PO) Take 400 Units by mouth daily.     . Choline Fenofibrate (FENOFIBRIC ACID) 135 MG CPDR Take 135 mg by mouth at bedtime.    . clopidogrel (PLAVIX) 75 MG tablet Take 1 tablet (75 mg total) by mouth daily. 90 tablet 3  . Coenzyme Q10 (CO Q-10) 100 MG CAPS Take 100 mg by mouth at bedtime.    . dicyclomine (BENTYL) 10 MG capsule Take 10 mg by mouth 4 (four) times daily -  before meals and at bedtime.    . fluticasone (FLONASE) 50 MCG/ACT nasal spray Place 2 sprays into both nostrils daily as needed for allergies.      Marland Kitchen gabapentin (NEURONTIN) 300 MG capsule TAKE (2) CAPSULES THREE TIMES DAILY. 9AM, 3PM, 9PM 180 capsule 0  . glipiZIDE (GLUCOTROL XL) 5 MG 24 hr tablet Take 5 mg by mouth daily.    . hydrochlorothiazide (HYDRODIURIL) 25 MG tablet Take 25 mg by mouth daily.    . Insulin NPH Human, Isophane, (NOVOLIN N RELION [place]) Inject 44 Units into the skin at bedtime.    Marland Kitchen LORazepam (ATIVAN) 0.5 MG tablet Take 0.5 mg by mouth 4 (four) times daily. FOR ANXIETY    . losartan (COZAAR) 100 MG tablet Take 100 mg by mouth daily.    . metFORMIN (GLUCOPHAGE) 1000 MG tablet Take 1,000 mg by mouth 2 (two) times daily with a meal.    . metoprolol tartrate (LOPRESSOR) 25 MG tablet Take 1 tablet (25 mg total) by mouth 2 (two) times daily. 60 tablet 0  . Multiple Vitamin (MULTIVITAMIN WITH MINERALS) TABS tablet Take 1 tablet by mouth daily.    . niacin 250 MG CR capsule Take 1 capsule (250 mg total) by mouth at bedtime. 30 capsule 0  . nortriptyline (PAMELOR) 50 MG capsule Take 50 mg by mouth at bedtime.    . ondansetron (ZOFRAN) 4 MG tablet Take 4 mg by mouth daily as needed for nausea or vomiting.    . Pancrelipase, Lip-Prot-Amyl, (CREON) 24000 UNITS CPEP Take 1 capsule by mouth 2 (two) times daily.     . pantoprazole (PROTONIX) 40 MG tablet Take 40 mg by mouth 2 (two) times daily.     . potassium chloride (K-DUR) 10 MEQ tablet Take 10 mEq by mouth every morning.    . pravastatin (PRAVACHOL) 80 MG tablet Take 80 mg by mouth daily.    Marland Kitchen PROAIR HFA 108 (90 BASE) MCG/ACT inhaler USE 2 PUFFS EVERY 6 HOURS AS NEEDED FOR WHEEZING 8.5 g 4  . Probiotic Product (ALIGN PO) Take 1 capsule by mouth daily.    Marland Kitchen venlafaxine XR (EFFEXOR-XR) 75 MG 24 hr capsule Take 75 mg by mouth 3 (three) times daily.    . [DISCONTINUED] metoCLOPramide (REGLAN) 10 MG tablet Take 1 tablet (10 mg total) by mouth 4 (four) times daily. Take as needed for nausea or vomiting 30 tablet 0   Current Facility-Administered Medications  Medication Dose Route  Frequency Provider Last Rate Last Dose  . triamcinolone acetonide (KENALOG) 10 MG/ML injection 10 mg  10 mg Intramuscular Once Harriet Masson, DPM        Allergies:   Codeine; Penicillins; Reglan; Xifaxan; Zocor; and Crestor    ROS:  Please see the history of present illness.   Otherwise, review of systems are positive for cramping after  she has walked quite a way on a given day.   All other systems are reviewed and negative.    PHYSICAL EXAM: VS:  BP 128/79 mmHg  Pulse 92  Ht 5\' 2"  (1.575 m)  Wt 194 lb (87.998 kg)  BMI 35.47 kg/m2 , BMI Body mass index is 35.47 kg/(m^2). GENERAL:  Well appearing NECK:  No jugular venous distention, waveform within normal limits, carotid upstroke brisk and symmetric, no bruits, no thyromegaly LUNGS:  Clear to auscultation bilaterally BACK:  No CVA tenderness CHEST:  Unremarkable HEART:  PMI not displaced or sustained,S1 and S2 within normal limits, no S3, no S4, no clicks, no rubs, no murmurs ABD:  Flat, positive bowel sounds normal in frequency in pitch, no bruits, no rebound, no guarding, no midline pulsatile mass, no hepatomegaly, no splenomegaly EXT:  2 plus pulses throughout, no edema, no cyanosis no clubbing   EKG:  EKG is not ordered today.    Recent Labs: 10/02/2014: ALT 24; Platelets 187 10/03/2014: Magnesium 2.0 01/07/2015: BUN 10; Creatinine, Ser 0.60; Hemoglobin 14.3; Potassium 3.4*; Sodium 140    Wt Readings from Last 3 Encounters:  03/15/15 194 lb (87.998 kg)  02/08/15 188 lb (85.276 kg)  01/07/15 193 lb 8 oz (87.771 kg)      Other studies Reviewed: Additional studies/ records that were reviewed today include:   Hospital records.    ASSESSMENT AND PLAN:  CHEST PAIN:  Her pain was atypical. She had a negative stress test. No further cardiovascular testing is suggested.  PREOP:  The patient is going to get her knee fixed. She had a negative stress test. Therefore, based on ACC/AHA guidelines, the patient would be at  acceptable risk for the planned procedure without further cardiovascular testing.  HTN:  The blood pressure is at target. No change in medications is indicated. We will continue with therapeutic lifestyle changes (TLC).  DYSLIPIDEMIA:  I will defer follow-up to [name], MD   Current medicines are reviewed at length with the patient today.  The patient does not have concerns regarding medicines.  The following changes have been made:  no change  Labs/ tests ordered today include: See above.  No   Disposition:   FU with me in 12  months   Signed, Minus Breeding, MD  03/15/2015 9:50 AM    [place]

## 2015-03-27 ENCOUNTER — Encounter (HOSPITAL_BASED_OUTPATIENT_CLINIC_OR_DEPARTMENT_OTHER): Payer: Self-pay | Admitting: *Deleted

## 2015-03-28 NOTE — H&P (Signed)
Destiny Robinson is an 59 y.o. female.   Chief Complaint:   Right Knee Pain  HPI: Destiny Robinson is here today for followup on right knee pain.  She was last seen on 02/09/15 at which time she was given a cortisone injection.  She does state that this helped somewhat but her symptoms persisted so we ordered an MRI.  She is here today to review the MRI results.  He states that her knee continues to swell and catch at times.  She denies any new injury.  She denies any fevers chills night sweats or other signs of infection.  She states that she continues to have significant pain.  Past Medical History  Diagnosis Date  . Hypertension   . Hypercholesterolemia   . Bronchitis   . Pancreatitis   . Complication of anesthesia 12/11/11    "angry, mean, hateful after" endoscopy & colonoscopy  . Asthma ~ 1991  . Pneumonia 06/2011; 07/2011  . Diabetes mellitus type 2, controlled   . GERD (gastroesophageal reflux disease)   . GI bleeding 12/21/11  . Hx of colonoscopy with polypectomy 12/11/11    "took out 7; couldn't get #8, that one was precancerous"  . Depression   . 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 Paralysis gastric   . Kidney stones   . Sleep apnea     uses a cpap-oxygen at night    Past Surgical History  Procedure Laterality Date  . Cesarean section  1981; 1986  . Esophagogastroduodenoscopy  12/11/2011    Procedure: ESOPHAGOGASTRODUODENOSCOPY (EGD);  Surgeon: Missy Sabins, MD;  Location: [facility] ENDOSCOPY;  Service: Endoscopy;  Laterality: N/A;  . Colonoscopy  12/11/2011    Procedure: COLONOSCOPY;  Surgeon: Missy Sabins, MD;  Location: [place];  Service: Endoscopy;  Laterality: N/A;  . Abdominal hysterectomy  2002  . Cholecystectomy  ~ 2002  . Tonsillectomy and adenoidectomy  1988  . Appendectomy  ~ 2002    "w/hysterectomy"  . Colonoscopy  04/26/2012    Procedure: COLONOSCOPY;  Surgeon: Wonda Horner, MD;  Location: [facility] ENDOSCOPY;  Service: Endoscopy;  Laterality:  N/A;  apc  . Hernia repair      Umbilical  . Open reduction internal fixation (orif) distal radial fracture Left 01/07/2015    Procedure: OPEN REDUCTION INTERNAL FIXATION (ORIF) LEFT DISTAL RADIAL FRACTURE;  Surgeon: Milly Jakob, MD;  Location: [place];  Service: Orthopedics;  Laterality: Left;    Family History  Problem Relation Age of Onset  . Colon cancer Sister   . Alzheimer's disease Mother   . Heart disease Father     Enlarged heart   Social History:  reports that she quit smoking about 18 years ago. Her smoking use included Cigarettes. She has a .72 pack-year smoking history. She has never used smokeless tobacco. She reports that she does not drink alcohol or use illicit drugs.  Allergies:  Allergies  Allergen Reactions  . Codeine Hypertension  . Penicillins Anaphylaxis  . Reglan [Metoclopramide] Swelling and Rash    5MG  MAKES THROAT SWELL  . Xifaxan [Rifaximin] Hives, Shortness Of Breath and Swelling    SOB 550MG  MAKES THROAT SWELL SOB  . Zocor [Simvastatin] Anaphylaxis and Hives  . Crestor [Rosuvastatin] Other (See Comments)    Muscles ache    No prescriptions prior to admission    No results found for this or any previous visit (from the past 48 hour(s)). No  results found.  Review of Systems  Constitutional: Positive for fever, malaise/fatigue and diaphoresis.  Gastrointestinal: Positive for heartburn, nausea, vomiting, abdominal pain and diarrhea.  Genitourinary: Positive for frequency.  Musculoskeletal: Positive for myalgias and joint pain.  Endo/Heme/Allergies: Positive for polydipsia.  Psychiatric/Behavioral: Positive for depression. The patient is nervous/anxious.     Height 5\' 2"  (1.575 m), weight 87.091 kg (192 lb). Physical Exam  Constitutional: She is oriented to person, place, and time. She appears well-developed and well-nourished.  HENT:  Head: Normocephalic and atraumatic.  Eyes: Pupils are equal, round, and reactive to  light.  Neck: Normal range of motion. Neck supple.  Cardiovascular: Intact distal pulses.   Respiratory: Effort normal.  Musculoskeletal: She exhibits tenderness.  she continues to have good range of motion from 0-120.  She does have tenderness over the medial joint line.  She has brisk capillary refill and is neurovascularly intact distally.  McMurray's test is positive for increased pain.  Neurological: She is alert and oriented to person, place, and time.  Skin: Skin is warm and dry.  Psychiatric: She has a normal mood and affect. Her behavior is normal. Judgment and thought content normal.     Assessment/Plan Assess: Right knee medial meniscus tear  Plan: Treatment options are discussed with the patient.  This patient is also discussed with Dr. Mayer Camel who reviews the patient's images with the patient.  the patient has elected to proceed with surgery.  A posting slip is completed and she is to discuss scheduling with Sandi Raveling at her convenience.  We anticipate seeing the patient back at time of surgical intervention.  Benefits, risks and potential competitions of surgery are discussed with the patient.  She does request a medication for pain and states that she cannot use codeine.  She is given a prescription for Percocet 5/325 one tablet twice daily #60.  She has used this medication in the past.  PHILLIPS, ERIC R 03/28/2015, 5:50 PM

## 2015-03-29 ENCOUNTER — Ambulatory Visit (HOSPITAL_BASED_OUTPATIENT_CLINIC_OR_DEPARTMENT_OTHER)
Admission: RE | Admit: 2015-03-29 | Discharge: 2015-03-29 | Disposition: A | Discharge status: Home / Self Care | Payer: Medicare Other | Source: Ambulatory Visit | Attending: Orthopedic Surgery | Admitting: Orthopedic Surgery

## 2015-03-29 ENCOUNTER — Ambulatory Visit (HOSPITAL_BASED_OUTPATIENT_CLINIC_OR_DEPARTMENT_OTHER): Payer: Medicare Other | Admitting: Anesthesiology

## 2015-03-29 ENCOUNTER — Encounter (HOSPITAL_BASED_OUTPATIENT_CLINIC_OR_DEPARTMENT_OTHER): Payer: Self-pay

## 2015-03-29 ENCOUNTER — Encounter (HOSPITAL_BASED_OUTPATIENT_CLINIC_OR_DEPARTMENT_OTHER)
Admission: RE | Disposition: A | Discharge status: Home / Self Care | Payer: Self-pay | Source: Ambulatory Visit | Attending: Orthopedic Surgery

## 2015-03-29 DIAGNOSIS — M94261 Chondromalacia, right knee: Secondary | ICD-10-CM | POA: Diagnosis not present

## 2015-03-29 DIAGNOSIS — X58XXXA Exposure to other specified factors, initial encounter: Secondary | ICD-10-CM | POA: Insufficient documentation

## 2015-03-29 DIAGNOSIS — E119 Type 2 diabetes mellitus without complications: Secondary | ICD-10-CM | POA: Insufficient documentation

## 2015-03-29 DIAGNOSIS — Z880 Allergy status to penicillin: Secondary | ICD-10-CM | POA: Diagnosis not present

## 2015-03-29 DIAGNOSIS — S83241A Other tear of medial meniscus, current injury, right knee, initial encounter: Secondary | ICD-10-CM | POA: Diagnosis not present

## 2015-03-29 DIAGNOSIS — Z01818 Encounter for other preprocedural examination: Secondary | ICD-10-CM | POA: Insufficient documentation

## 2015-03-29 DIAGNOSIS — Z87891 Personal history of nicotine dependence: Secondary | ICD-10-CM | POA: Insufficient documentation

## 2015-03-29 DIAGNOSIS — I10 Essential (primary) hypertension: Secondary | ICD-10-CM | POA: Insufficient documentation

## 2015-03-29 HISTORY — PX: KNEE ARTHROSCOPY WITH MEDIAL MENISECTOMY: SHX5651

## 2015-03-29 LAB — POCT I-STAT, CHEM 8
BUN: 10 mg/dL (ref 6–20)
CHLORIDE: 99 mmol/L — AB (ref 101–111)
CREATININE: 0.6 mg/dL (ref 0.44–1.00)
Calcium, Ion: 1.12 mmol/L (ref 1.12–1.23)
GLUCOSE: 144 mg/dL — AB (ref 65–99)
HEMATOCRIT: 41 % (ref 36.0–46.0)
Hemoglobin: 13.9 g/dL (ref 12.0–15.0)
POTASSIUM: 3.7 mmol/L (ref 3.5–5.1)
Sodium: 141 mmol/L (ref 135–145)
TCO2: 27 mmol/L (ref 0–100)

## 2015-03-29 LAB — GLUCOSE, CAPILLARY: GLUCOSE-CAPILLARY: 140 mg/dL — AB (ref 65–99)

## 2015-03-29 SURGERY — ARTHROSCOPY, KNEE, WITH MEDIAL MENISCECTOMY
Anesthesia: General | Site: Knee | Laterality: Right

## 2015-03-29 MED ORDER — OXYCODONE HCL 5 MG PO TABS: 5.0000 mg | ORAL_TABLET | Freq: Once | ORAL | Status: DC | PRN

## 2015-03-29 MED ORDER — MIDAZOLAM HCL 2 MG/2ML IJ SOLN
INTRAMUSCULAR | Status: AC
  Filled 2015-03-29: qty 2

## 2015-03-29 MED ORDER — SCOPOLAMINE 1 MG/3DAYS TD PT72: 1.0000 | MEDICATED_PATCH | Freq: Once | TRANSDERMAL | Status: DC | PRN

## 2015-03-29 MED ORDER — FENTANYL CITRATE (PF) 100 MCG/2ML IJ SOLN
INTRAMUSCULAR | Status: AC
Start: 2015-03-29 — End: 2015-03-29
  Filled 2015-03-29: qty 6

## 2015-03-29 MED ORDER — MIDAZOLAM HCL 2 MG/2ML IJ SOLN
1.0000 mg | INTRAMUSCULAR | Status: DC | PRN
  Administered 2015-03-29: 2 mg via INTRAVENOUS

## 2015-03-29 MED ORDER — ONDANSETRON HCL 4 MG/2ML IJ SOLN
INTRAMUSCULAR | Status: DC | PRN
  Administered 2015-03-29: 4 mg via INTRAVENOUS

## 2015-03-29 MED ORDER — LIDOCAINE HCL (CARDIAC) 20 MG/ML IV SOLN
INTRAVENOUS | Status: DC | PRN
  Administered 2015-03-29: 75 mg via INTRAVENOUS

## 2015-03-29 MED ORDER — HYDROMORPHONE HCL 1 MG/ML IJ SOLN: 0.2500 mg | INTRAMUSCULAR | Status: DC | PRN

## 2015-03-29 MED ORDER — MEPERIDINE HCL 25 MG/ML IJ SOLN: 6.2500 mg | INTRAMUSCULAR | Status: DC | PRN

## 2015-03-29 MED ORDER — PROPOFOL 10 MG/ML IV BOLUS
INTRAVENOUS | Status: DC | PRN
  Administered 2015-03-29: 200 mg via INTRAVENOUS

## 2015-03-29 MED ORDER — BUPIVACAINE-EPINEPHRINE 0.5% -1:200000 IJ SOLN
INTRAMUSCULAR | Status: DC | PRN
  Administered 2015-03-29: 20 mL

## 2015-03-29 MED ORDER — LACTATED RINGERS IV SOLN
INTRAVENOUS | Status: DC
  Administered 2015-03-29: 08:00:00 via INTRAVENOUS

## 2015-03-29 MED ORDER — SODIUM CHLORIDE 0.9 % IR SOLN
Status: DC | PRN
  Administered 2015-03-29: 3000 mL

## 2015-03-29 MED ORDER — FENTANYL CITRATE (PF) 100 MCG/2ML IJ SOLN
50.0000 ug | INTRAMUSCULAR | Status: DC | PRN
  Administered 2015-03-29: 100 ug via INTRAVENOUS

## 2015-03-29 MED ORDER — BUPIVACAINE-EPINEPHRINE (PF) 0.5% -1:200000 IJ SOLN
INTRAMUSCULAR | Status: AC
  Filled 2015-03-29: qty 30

## 2015-03-29 MED ORDER — OXYCODONE-ACETAMINOPHEN 5-325 MG PO TABS: 1.0000 | ORAL_TABLET | Freq: Four times a day (QID) | ORAL | Status: DC | PRN

## 2015-03-29 MED ORDER — GLYCOPYRROLATE 0.2 MG/ML IJ SOLN: 0.2000 mg | Freq: Once | INTRAMUSCULAR | Status: DC | PRN

## 2015-03-29 MED ORDER — EPINEPHRINE HCL 1 MG/ML IJ SOLN
INTRAMUSCULAR | Status: DC | PRN
  Administered 2015-03-29: 1 mg

## 2015-03-29 MED ORDER — OXYCODONE HCL 5 MG/5ML PO SOLN: 5.0000 mg | Freq: Once | ORAL | Status: DC | PRN

## 2015-03-29 MED ORDER — BUPIVACAINE HCL (PF) 0.5 % IJ SOLN
INTRAMUSCULAR | Status: AC
  Filled 2015-03-29: qty 30

## 2015-03-29 SURGICAL SUPPLY — 43 items
BANDAGE ELASTIC 6 VELCRO ST LF (GAUZE/BANDAGES/DRESSINGS) ×3 IMPLANT
BLADE 4.2CUDA (BLADE) IMPLANT
BLADE CUTTER GATOR 3.5 (BLADE) ×3 IMPLANT
BLADE GREAT WHITE 4.2 (BLADE) ×2 IMPLANT
BLADE GREAT WHITE 4.2MM (BLADE) ×1
BNDG COHESIVE 6X5 TAN STRL LF (GAUZE/BANDAGES/DRESSINGS) ×3 IMPLANT
DRAPE ARTHROSCOPY W/POUCH 114 (DRAPES) ×3 IMPLANT
DURAPREP 26ML APPLICATOR (WOUND CARE) ×3 IMPLANT
ELECT MENISCUS 165MM 90D (ELECTRODE) IMPLANT
ELECT REM PT RETURN 9FT ADLT (ELECTROSURGICAL)
ELECTRODE REM PT RTRN 9FT ADLT (ELECTROSURGICAL) IMPLANT
GAUZE SPONGE 4X4 12PLY STRL (GAUZE/BANDAGES/DRESSINGS) ×3 IMPLANT
GAUZE XEROFORM 1X8 LF (GAUZE/BANDAGES/DRESSINGS) ×3 IMPLANT
GLOVE BIO SURGEON STRL SZ 6.5 (GLOVE) ×2 IMPLANT
GLOVE BIO SURGEON STRL SZ7.5 (GLOVE) ×3 IMPLANT
GLOVE BIO SURGEON STRL SZ8.5 (GLOVE) ×3 IMPLANT
GLOVE BIO SURGEONS STRL SZ 6.5 (GLOVE) ×1
GLOVE BIOGEL PI IND STRL 6.5 (GLOVE) ×1 IMPLANT
GLOVE BIOGEL PI IND STRL 8 (GLOVE) ×1 IMPLANT
GLOVE BIOGEL PI IND STRL 9 (GLOVE) ×1 IMPLANT
GLOVE BIOGEL PI INDICATOR 6.5 (GLOVE) ×2
GLOVE BIOGEL PI INDICATOR 8 (GLOVE) ×2
GLOVE BIOGEL PI INDICATOR 9 (GLOVE) ×2
GOWN STRL REUS W/ TWL LRG LVL3 (GOWN DISPOSABLE) ×2 IMPLANT
GOWN STRL REUS W/TWL LRG LVL3 (GOWN DISPOSABLE) ×4
GOWN STRL REUS W/TWL XL LVL3 (GOWN DISPOSABLE) ×3 IMPLANT
IV NS IRRIG 3000ML ARTHROMATIC (IV SOLUTION) ×3 IMPLANT
KNEE WRAP E Z 3 GEL PACK (MISCELLANEOUS) ×3 IMPLANT
MANIFOLD NEPTUNE II (INSTRUMENTS) ×3 IMPLANT
NDL SAFETY ECLIPSE 18X1.5 (NEEDLE) ×1 IMPLANT
NEEDLE HYPO 18GX1.5 SHARP (NEEDLE) ×2
PACK ARTHROSCOPY DSU (CUSTOM PROCEDURE TRAY) ×3 IMPLANT
PACK BASIN DAY SURGERY FS (CUSTOM PROCEDURE TRAY) ×3 IMPLANT
PAD ALCOHOL SWAB (MISCELLANEOUS) ×3 IMPLANT
PENCIL BUTTON HOLSTER BLD 10FT (ELECTRODE) IMPLANT
SET ARTHROSCOPY TUBING (MISCELLANEOUS) ×2
SET ARTHROSCOPY TUBING LN (MISCELLANEOUS) ×1 IMPLANT
SLEEVE SCD COMPRESS KNEE MED (MISCELLANEOUS) ×3 IMPLANT
SYR 3ML 18GX1 1/2 (SYRINGE) IMPLANT
SYR 5ML LL (SYRINGE) ×3 IMPLANT
TOWEL OR 17X24 6PK STRL BLUE (TOWEL DISPOSABLE) ×3 IMPLANT
WAND STAR VAC 90 (SURGICAL WAND) IMPLANT
WATER STERILE IRR 1000ML POUR (IV SOLUTION) ×3 IMPLANT

## 2015-03-29 NOTE — Interval H&P Note (Signed)
History and Physical Interval Note:  03/29/2015 9:22 AM  Destiny Robinson  has presented today for surgery, with the diagnosis of Right Knee Medial Meniscus  The various methods of treatment have been discussed with the patient and family. After consideration of risks, benefits and other options for treatment, the patient has consented to  Procedure(s) with comments: ARTHROSCOPY KNEE (Right) - RIGHT KNEE ARTHROSCOPY as a surgical intervention .  The patient's history has been reviewed, patient examined, no change in status, stable for surgery.  I have reviewed the patient's chart and labs.  Questions were answered to the patient's satisfaction.     Kerin Salen

## 2015-03-29 NOTE — Discharge Instructions (Addendum)
Arthroscopic Procedure, Knee An arthroscopic procedure can find what is wrong with your knee. PROCEDURE Arthroscopy is a surgical technique that allows your orthopedic surgeon to diagnose and treat your knee injury with accuracy. They will look into your knee through a small instrument. This is almost like a small (pencil sized) telescope. Because arthroscopy affects your knee less than open knee surgery, you can anticipate a more rapid recovery. Taking an active role by following your caregiver's instructions will help with rapid and complete recovery. Use crutches, rest, elevation, ice, and knee exercises as instructed. The length of recovery depends on various factors including type of injury, age, physical condition, medical conditions, and your rehabilitation. Your knee is the joint between the large bones (femur and tibia) in your leg. Cartilage covers these bone ends which are smooth and slippery and allow your knee to bend and move smoothly. Two menisci, thick, semi-lunar shaped pads of cartilage which form a rim inside the joint, help absorb shock and stabilize your knee. Ligaments bind the bones together and support your knee joint. Muscles move the joint, help support your knee, and take stress off the joint itself. Because of this all programs and physical therapy to rehabilitate an injured or repaired knee require rebuilding and strengthening your muscles. AFTER THE PROCEDURE  After the procedure, you will be moved to a recovery area until most of the effects of the medication have worn off. Your caregiver will discuss the test results with you.  Only take over-the-counter or prescription medicines for pain, discomfort, or fever as directed by your caregiver. SEEK MEDICAL CARE IF:   You have increased bleeding from your wounds.  You see redness, swelling, or have increasing pain in your wounds.  You have pus coming from your wound.  You have an oral temperature above 102 F (38.9  C).  You notice a bad smell coming from the wound or dressing.  You have severe pain with any motion of your knee. SEEK IMMEDIATE MEDICAL CARE IF:   You develop a rash.  You have difficulty breathing.  You have any allergic problems. Document Released: 09/18/2000 Document Revised: 12/14/2011 Document Reviewed: 04/11/2008 [facility] Patient Information 2015 [place], [place]. This information is not intended to replace advice given to you by your health care provider. Make sure you discuss any questions you have with your health care provider.   Post Anesthesia Home Care Instructions  Activity: Get plenty of rest for the remainder of the day. A responsible adult should stay with you for 24 hours following the procedure.  For the next 24 hours, DO NOT: -Drive a car -Paediatric nurse -Drink alcoholic beverages -Take any medication unless instructed by your physician -Make any legal decisions or sign important papers.  Meals: Start with liquid foods such as gelatin or soup. Progress to regular foods as tolerated. Avoid greasy, spicy, heavy foods. If nausea and/or vomiting occur, drink only clear liquids until the nausea and/or vomiting subsides. Call your physician if vomiting continues.  Special Instructions/Symptoms: Your throat may feel dry or sore from the anesthesia or the breathing tube placed in your throat during surgery. If this causes discomfort, gargle with warm salt water. The discomfort should disappear within 24 hours.  If you had a scopolamine patch placed behind your ear for the management of post- operative nausea and/or vomiting:  1. The medication in the patch is effective for 72 hours, after which it should be removed.  Wrap patch in a tissue and discard in the trash. Wash  hands thoroughly with soap and water. 2. You may remove the patch earlier than 72 hours if you experience unpleasant side effects which may include dry mouth, dizziness or visual disturbances. 3.  Avoid touching the patch. Wash your hands with soap and water after contact with the patch.

## 2015-03-29 NOTE — Anesthesia Postprocedure Evaluation (Signed)
  Anesthesia Post-op Note  Patient: Destiny Robinson  Procedure(s) Performed: Procedure(s) with comments: ARTHROSCOPY KNEE, medial menisectomy, removal of loose body (Right) - RIGHT KNEE ARTHROSCOPY  Patient Location: PACU  Anesthesia Type: General   Level of Consciousness: awake, alert  and oriented  Airway and Oxygen Therapy: Patient Spontanous Breathing  Post-op Pain: none  Post-op Assessment: Post-op Vital signs reviewed  Post-op Vital Signs: Reviewed  Last Vitals:  Filed Vitals:   03/29/15 1045  BP: 116/77  Pulse: 96  Temp:   Resp: 16    Complications: No apparent anesthesia complications

## 2015-03-29 NOTE — Op Note (Signed)
Pre-Op Dx: R knee MMT  Postop Dx: R Knee MMT, LB   Procedure:   Surgeon: Kathalene Frames. Mayer Camel M.D.  Assist: Kerry Hough. Barton Dubois  (present throughout entire procedure and necessary for timely completion of the procedure) Anes: General LMA  EBL: Minimal  Fluids: 800 cc   Indications: catching popping and pain to the right knee. MRI scan clearly shows large multi-fragmented medial meniscal tear. Pt has failed conservative treatment with anti-inflammatory medicines, physical therapy, and modified activites but did get good temporarily from an intra-articular cortisone injection. Pain has recurred and patient desires elective arthroscopic evaluation and treatment of knee. Risks and benefits of surgery have been discussed and questions answered.  Procedure: Patient identified by arm band and taken to the operating room at the day surgery Center. The appropriate anesthetic monitors were attached, and General LMA anesthesia was induced without difficulty. Lateral post was applied to the table and the lower extremity was prepped and draped in usual sterile fashion from the ankle to the midthigh. Time out procedure was performed. We began the operation by making standard inferior lateral and inferior medial peripatellar portals with a #11 blade allowing introduction of the arthroscope through the inferior lateral portal and the out flow to the inferior medial portal. 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 Medially the patient did have a large multi-fragmented medial meniscal tear that was removed with a 4.2 gray-white sucker shaver, 3.5 Gator sucker shaver and straight biters and up biters. The anterior cruciate ligament and PCL are intact. On the lateral side there was minimal chondromalacia incidentally debrided. He did find multiple chondral loose bodies probably from a meniscal tear in the knee and these were removed with  the sucker shaver or the outflow. The largest loose body was 8 mm x 2 mm x 4 mm.. The knee was irrigated out normal saline solution. A dressing of xerofoam 4 x 4 dressing sponges, web roll and an Ace wrap was applied. The patient was awakened extubated and taken to the recovery without difficulty.    Signed: Kerin Salen, MD

## 2015-03-29 NOTE — Anesthesia Preprocedure Evaluation (Signed)
Anesthesia Evaluation  Patient identified by MRN, date of birth, ID band Patient awake    Reviewed: Allergy & Precautions, NPO status , Patient's Chart, lab work & pertinent test results  Airway Mallampati: I  TM Distance: >3 FB Neck ROM: Full    Dental  (+) Upper Dentures, Lower Dentures, Dental Advisory Given   Pulmonary former smoker,  breath sounds clear to auscultation        Cardiovascular hypertension, Pt. on medications Rhythm:Regular Rate:Normal     Neuro/Psych    GI/Hepatic GERD-  Medicated and Controlled,  Endo/Other  diabetes, Well Controlled, Type 2, Insulin Dependent, Oral Hypoglycemic AgentsMorbid obesity  Renal/GU      Musculoskeletal   Abdominal   Peds  Hematology   Anesthesia Other Findings   Reproductive/Obstetrics                             Anesthesia Physical Anesthesia Plan  ASA: III  Anesthesia Plan: General   Post-op Pain Management:    Induction: Intravenous  Airway Management Planned: LMA  Additional Equipment:   Intra-op Plan:   Post-operative Plan: Extubation in OR  Informed Consent: I have reviewed the patients History and Physical, chart, labs and discussed the procedure including the risks, benefits and alternatives for the proposed anesthesia with the patient or authorized representative who has indicated his/her understanding and acceptance.   Dental advisory given  Plan Discussed with: CRNA, Anesthesiologist and Surgeon  Anesthesia Plan Comments:         Anesthesia Quick Evaluation

## 2015-03-29 NOTE — Anesthesia Procedure Notes (Signed)
Procedure Name: LMA Insertion Performed by: Vere Diantonio, [place] Pre-anesthesia Checklist: Patient identified, Emergency Drugs available, Suction available and Patient being monitored Patient Re-evaluated:Patient Re-evaluated prior to inductionOxygen Delivery Method: Circle System Utilized Preoxygenation: Pre-oxygenation with 100% oxygen Intubation Type: IV induction Ventilation: Mask ventilation without difficulty LMA: LMA inserted LMA Size: 4.0 Number of attempts: 1 Airway Equipment and Method: Bite block Placement Confirmation: positive ETCO2 Tube secured with: Tape Dental Injury: Teeth and Oropharynx as per pre-operative assessment      

## 2015-03-29 NOTE — Transfer of Care (Signed)
Immediate Anesthesia Transfer of Care Note  Patient: Destiny Robinson  Procedure(s) Performed: Procedure(s) with comments: ARTHROSCOPY KNEE, medial menisectomy, removal of loose body (Right) - RIGHT KNEE ARTHROSCOPY  Patient Location: PACU  Anesthesia Type:General  Level of Consciousness: sedated  Airway & Oxygen Therapy: Patient Spontanous Breathing and Patient connected to face mask oxygen  Post-op Assessment: Report given to RN and Post -op Vital signs reviewed and stable  Post vital signs: Reviewed and stable  Last Vitals:  Filed Vitals:   03/29/15 0739  BP: 145/83  Pulse: 86  Temp: 36.7 C  Resp: 20    Complications: No apparent anesthesia complications

## 2015-04-01 ENCOUNTER — Encounter (HOSPITAL_BASED_OUTPATIENT_CLINIC_OR_DEPARTMENT_OTHER): Payer: Self-pay | Admitting: Orthopedic Surgery

## 2015-04-15 ENCOUNTER — Ambulatory Visit (INDEPENDENT_AMBULATORY_CARE_PROVIDER_SITE_OTHER): Payer: Medicare Other | Admitting: Neurology

## 2015-04-15 ENCOUNTER — Encounter: Payer: Self-pay | Admitting: Neurology

## 2015-04-15 VITALS — BP 127/82 | HR 92 | Ht 62.0 in | Wt 193.6 lb

## 2015-04-15 DIAGNOSIS — E1159 Type 2 diabetes mellitus with other circulatory complications: Secondary | ICD-10-CM

## 2015-04-15 DIAGNOSIS — I10 Essential (primary) hypertension: Secondary | ICD-10-CM

## 2015-04-15 DIAGNOSIS — E785 Hyperlipidemia, unspecified: Secondary | ICD-10-CM | POA: Diagnosis not present

## 2015-04-15 DIAGNOSIS — G43109 Migraine with aura, not intractable, without status migrainosus: Secondary | ICD-10-CM | POA: Insufficient documentation

## 2015-04-15 DIAGNOSIS — G4733 Obstructive sleep apnea (adult) (pediatric): Secondary | ICD-10-CM

## 2015-04-15 DIAGNOSIS — E669 Obesity, unspecified: Secondary | ICD-10-CM

## 2015-04-15 DIAGNOSIS — Z9989 Dependence on other enabling machines and devices: Secondary | ICD-10-CM

## 2015-04-15 NOTE — Progress Notes (Signed)
STROKE NEUROLOGY FOLLOW UP NOTE  NAME: Destiny Robinson DOB: 1956-09-16  REASON FOR VISIT: stroke follow up HISTORY FROM: pt and chart  Today we had the pleasure of seeing Destiny Robinson in follow-up at our Neurology Clinic. Pt was accompanied by no one.   History Summary [name] is a 59 y.o. female with PMH of hypertension, diabetes, diabetic gastroparesis, hyperlipidemia, obesity, OSA on CPAP who presents as a new patient for episodic left-sided weakness on 10/30/14.   Patient stated that since October last year she started to have this episodic left-sided numbness, involving left face, arm and leg. It happens 4-5 times per months for October, November and December. Most time it happens one patient was sitting still. The numbness happens acutely, lasting about 30 minutes and resolved spontaneously. Starting in December, her episode was followed by headache, the headache is more bilateral frontal temporal and occipital, 8-9/10, she has to take 2 x 500mg  Tylenol for the headache and take a nap for 2 hours. After the nap headache was gone. Blurry vision was associated with headache, denies nausea vomiting, photophobia phonophobia. After each episode she will feeding generalized weakness for a while and recovered. On 10/01/2014, she had again left-sided numbness, however, it did not resolve within 30 minutes. After 2 hours, she presented herself to the ER for further evaluation. CAT scan of the head showed no acute abnormality, but bilateral basal ganglia lacunar infarcts. MRI brain confirmed no acute infarcts but bilateral basal ganglia lacunar infarcts. Her other stroke workup negative, except high LDL and TG. The numbness lasted about 12 hours and then subsided. She was put on aspirin 81 and discharged with outpatient follow-up. After discharge, her episode was initially subsided, however for the last 3 days, she had again 5 times of left-sided numbness episodes followed by headache. She came  in today for further evaluation. She had a history of hyperlipidemia, follow-up with her PCP [name] after discharge, and was put on pravastatin 80 mg, fenofibrate, and niacin, and was told her TG and LDL were much better. She had allergic reaction to Lipitor, and not able to tolerate with Crestor or simvastatin. She had a history of hypertension on HCTZ, losartan, and metoprolol. She stated her blood pressure at home around 135/80, but today in clinic 156/93. She has a history of diabetes, diabetic neuropathy, diabetic gastroparesis, on insulin injection, metformin and glipizide. She said her sugar was between 80-90 at home, her A1c was 6.6 in December. She denies any history of migraine, however her sister and her mom have migraine. She denies any visual aura with headache, but admits to headache associated with the blurry vision. She had a history of neuropathy, has left lower extremity below knee numbness before the admission, constant and 2 now. She is obese, former smoker but quit at age of 45. She had OSA on CPAP, has been followed with pulmonology. She denies any alcohol drinking, or illicit drug use.  12/11/14 follow up -  the patient has been doing well. She was put on nortriptyline after last visit. She stated that for the last 1+ month, she only had one episode on 11/18/14 with left sided numbness lasting 30 min followed by headache and she had to sleep for 2 hours. The frequency much decreased than before. She had EEG which showed no seizure activity. Her BP 148/79 today but normally 135/85 at home. Her glucose between 50-150 at home. She has appointment on 01/04/15 with PCP.    Interval  History During the interval time, pt is doing OK. She had a fall on 01/02/15 and had fracture at left forearm. She had surgery and currently recovering well, no functional loss. Her BP 127/82 and no recurrent symptoms. No more headaches.   REVIEW OF SYSTEMS: Full 14 system review of systems performed and  notable only for those listed below and in HPI above, all others are negative:  Constitutional:  fatigue Cardiovascular:  Ear/Nose/Throat:  Runny nose Skin:  Eyes:  Eye itching Respiratory:   Gastroitestinal:  Abdominal pain, diarrhea, N/V Genitourinary:  Hematology/Lymphatic:  Bruise easily Endocrine:  Musculoskeletal:  Muscle cramps Allergy/Immunology:  Env allergies Neurological:  Dizziness, numbness, weakness Psychiatric: depression Sleep: apnea  The following represents the patient's updated allergies and side effects list: Allergies  Allergen Reactions  . Codeine Hypertension  . Penicillins Anaphylaxis  . Reglan [Metoclopramide] Swelling and Rash    5MG  MAKES THROAT SWELL  . Xifaxan [Rifaximin] Hives, Shortness Of Breath and Swelling    SOB 550MG  MAKES THROAT SWELL SOB  . Zocor [Simvastatin] Anaphylaxis and Hives  . Crestor [Rosuvastatin] Other (See Comments)    Muscles ache    The neurologically relevant items on the patient's problem list were reviewed on today's visit.  Neurologic Examination  A problem focused neurological exam (12 or more points of the single system neurologic examination, vital signs counts as 1 point, cranial nerves count for 8 points) was performed.  Blood pressure 127/82, pulse 92, height 5\' 2"  (1.575 m), weight 193 lb 9.6 oz (87.816 kg).  General - obese, well developed, in no apparent distress.  Ophthalmologic - Sharp disc margins OU.  Cardiovascular - Regular rate and rhythm with no murmur.  Mental Status -  Level of arousal and orientation to time, place, and person were intact. Language including expression, naming, repetition, comprehension, reading, and writing was assessed and found intact.  Cranial Nerves II - XII - II - Visual field intact OU. III, IV, VI - Extraocular movements intact. V - Facial sensation intact bilaterally. VII - Facial movement intact bilaterally. VIII - Hearing & vestibular intact bilaterally. X -  Palate elevates symmetrically. XI - Chin turning & shoulder shrug intact bilaterally. XII - Tongue protrusion intact.  Motor Strength - The patient's strength was normal in all extremities and pronator drift was absent.  Bulk was normal and fasciculations were absent.   Motor Tone - Muscle tone was assessed at the neck and appendages and was normal.  Reflexes - The patient's reflexes were normal in all extremities and she had no pathological reflexes.  Sensory - Light touch, temperature/pinprick, vibration and proprioception, and Romberg testing were assessed and were normal.    Coordination - The patient had normal movements in the hands and feet with no ataxia or dysmetria.  Tremor was absent.  Gait and Station - The patient's transfers, posture, gait, station, and turns were observed as normal.  Data reviewed: I personally reviewed the images and agree with the radiology interpretations.  CT head 10/01/14 -  1. Age-indeterminate lacunar infarcts in the right and left basal ganglia. 2. Ectasia of the basilar artery, described previously.  MRI and MRA head and neck 10/01/14 Negative for acute infarct. Moderately severe chronic microvascular ischemic changes. Moderately severe chronic micro hemorrhage in the brain. 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 No focal intracranial stenosis  CUS - Bilateral: 1-39% ICA stenosis. Vertebral artery flow is antegrade.  2D echo - - Left ventricle: The  cavity size was normal. Wall thickness was normal. Systolic function was mildly reduced. The estimated ejection fraction was in the range of 45% to 50%. Wall motion was normal; there were no regional wall motion abnormalities. Doppler parameters are consistent with abnormal left ventricular relaxation (grade 1 diastolic dysfunction). - Mitral valve: Calcified annulus. Mildly thickened leaflets . - Pulmonary  arteries: PA peak pressure: 38 mm Hg (S).  Component  Latest Ref Rng 10/02/2014  Cholesterol  0 - 200 mg/dL 259 (H)  Triglycerides  <150 mg/dL 523 (H)  HDL  >39 mg/dL 33 (L)  Total CHOL/HDL Ratio   7.8  VLDL  0 - 40 mg/dL UNABLE TO CALCULATE IF TRIGLYCERIDE OVER 400 mg/dL  LDL (calc)  0 - 99 mg/dL UNABLE TO CALCULATE IF TRIGLYCERIDE OVER 400 mg/dL  Hgb A1c MFr Bld  <5.7 % 6.6 (H)  Mean Plasma Glucose  <117 mg/dL 143 (H)          Assessment: As you may recall, she is a 59 y.o. Caucasian female with PMH of hypertension, diabetes, diabetic gastroparesis, hyperlipidemia, obesity, OSA on CPAP follow up in clinic for episodic left-sided weakness followed by headache. Stroke work up negative except high TG and LDL. EEG negative for seizure. 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 Put on nortriptyline and frequency much decreased, only one episode for the last 4+ months.   Plan:  - continue plavix and pravastatin for stroke prevention - continue finofibrate for HLD - continue nortriptyline for migraine prevention - take tylenol PRN for migraine abortion - check BP and glucose at home - Follow up with your primary care physician for stroke risk factor modification. Recommend maintain blood pressure goal <130/80, diabetes with hemoglobin A1c goal below 6.5% and lipids with LDL cholesterol goal below 70 mg/dL.  - RTC PRN  No orders of the defined types were placed in this encounter.    Meds ordered this encounter  Medications  . fluconazole (DIFLUCAN) 100 MG tablet    Sig: TAKE 1 TABLET DAILY  . niacin 250 MG tablet    Sig: Take 250 mg by mouth.  . insulin NPH Human (NOVOLIN N) 100 UNIT/ML injection    Sig: Inject 44 Units into the skin.  Marland Kitchen LORazepam (ATIVAN) 0.5 MG tablet    Sig: TAKE (1) TABLET EVERY SIX HOURS AS NEEDED.  Marland Kitchen losartan (COZAAR) 100 MG tablet    Sig: TAKE 1 TABLET DAILY  . dicyclomine  (BENTYL) 10 MG capsule    Sig: Take 10 mg by mouth 30 (thirty) minutes before meals & at bedtime.  . pravastatin (PRAVACHOL) 80 MG tablet    Sig: TAKE 1 TABLET IN THE EVENING  . venlafaxine XR (EFFEXOR-XR) 75 MG 24 hr capsule    Sig: TAKE (1) CAPSULE THREE TIMES DAILY.    Patient Instructions  - continue plavix and pravastatin for stroke prevention - continue fenofibrate for HLD - check BP and glucose at home - Follow up with your primary care physician for stroke risk factor modification. Recommend maintain blood pressure goal <130/80, diabetes with hemoglobin A1c goal below 6.5% and lipids with LDL cholesterol goal below 70 mg/dL.  - continue nortriptyline for migraine prevention - take tylenol as soon as you feels HA is coming or your numbness starts. - regular exercise - follow up as needed.    Rosalin Hawking, MD PhD [facility] [address] [place], [place] [address] [phone]

## 2015-04-15 NOTE — Patient Instructions (Addendum)
-   continue plavix and pravastatin for stroke prevention - continue fenofibrate for HLD - check BP and glucose at home - Follow up with your primary care physician for stroke risk factor modification. Recommend maintain blood pressure goal <130/80, diabetes with hemoglobin A1c goal below 6.5% and lipids with LDL cholesterol goal below 70 mg/dL.  - continue nortriptyline for migraine prevention - take tylenol as soon as you feels HA is coming or your numbness starts. - regular exercise - follow up as needed.

## 2015-04-17 ENCOUNTER — Telehealth: Payer: Self-pay | Admitting: *Deleted

## 2015-04-17 MED ORDER — GABAPENTIN 300 MG PO CAPS: 600.0000 mg | ORAL_CAPSULE | Freq: Three times a day (TID) | ORAL | Status: DC

## 2015-04-17 NOTE — Telephone Encounter (Signed)
Dr. Paulla Dolly states refill Gabapentin 300mg  #180 take 2 tablets tid refill prn 1 year.

## 2015-06-26 ENCOUNTER — Other Ambulatory Visit (HOSPITAL_COMMUNITY): Payer: Self-pay | Admitting: Family Medicine

## 2015-06-26 DIAGNOSIS — Z1231 Encounter for screening mammogram for malignant neoplasm of breast: Secondary | ICD-10-CM

## 2015-06-27 ENCOUNTER — Ambulatory Visit (HOSPITAL_COMMUNITY)
Admission: RE | Admit: 2015-06-27 | Discharge: 2015-06-27 | Disposition: A | Discharge status: Home / Self Care | Payer: Medicare Other | Source: Ambulatory Visit | Attending: Family Medicine | Admitting: Family Medicine

## 2015-06-27 DIAGNOSIS — Z1231 Encounter for screening mammogram for malignant neoplasm of breast: Secondary | ICD-10-CM | POA: Diagnosis not present

## 2015-07-08 ENCOUNTER — Other Ambulatory Visit: Payer: Self-pay | Admitting: Neurology

## 2015-07-29 ENCOUNTER — Encounter (HOSPITAL_COMMUNITY): Payer: Self-pay | Admitting: Emergency Medicine

## 2015-07-29 ENCOUNTER — Emergency Department (HOSPITAL_COMMUNITY): Payer: Medicare Other

## 2015-07-29 ENCOUNTER — Inpatient Hospital Stay (HOSPITAL_COMMUNITY)
Admission: EM | Admit: 2015-07-29 | Discharge: 2015-08-07 | DRG: 064 | Disposition: A | Discharge status: Skilled Nursing Facility | Payer: Medicare Other | Attending: Neurology | Admitting: Neurology

## 2015-07-29 ENCOUNTER — Inpatient Hospital Stay (HOSPITAL_COMMUNITY): Payer: Medicare Other

## 2015-07-29 DIAGNOSIS — I615 Nontraumatic intracerebral hemorrhage, intraventricular: Secondary | ICD-10-CM | POA: Diagnosis present

## 2015-07-29 DIAGNOSIS — G4733 Obstructive sleep apnea (adult) (pediatric): Secondary | ICD-10-CM | POA: Diagnosis present

## 2015-07-29 DIAGNOSIS — Z79891 Long term (current) use of opiate analgesic: Secondary | ICD-10-CM

## 2015-07-29 DIAGNOSIS — R4182 Altered mental status, unspecified: Secondary | ICD-10-CM

## 2015-07-29 DIAGNOSIS — G936 Cerebral edema: Secondary | ICD-10-CM | POA: Diagnosis present

## 2015-07-29 DIAGNOSIS — Z8249 Family history of ischemic heart disease and other diseases of the circulatory system: Secondary | ICD-10-CM

## 2015-07-29 DIAGNOSIS — R29810 Facial weakness: Secondary | ICD-10-CM | POA: Diagnosis present

## 2015-07-29 DIAGNOSIS — E785 Hyperlipidemia, unspecified: Secondary | ICD-10-CM | POA: Diagnosis present

## 2015-07-29 DIAGNOSIS — Z888 Allergy status to other drugs, medicaments and biological substances status: Secondary | ICD-10-CM

## 2015-07-29 DIAGNOSIS — R000 Tachycardia, unspecified: Secondary | ICD-10-CM | POA: Diagnosis not present

## 2015-07-29 DIAGNOSIS — I619 Nontraumatic intracerebral hemorrhage, unspecified: Secondary | ICD-10-CM | POA: Diagnosis present

## 2015-07-29 DIAGNOSIS — Z880 Allergy status to penicillin: Secondary | ICD-10-CM | POA: Diagnosis not present

## 2015-07-29 DIAGNOSIS — I10 Essential (primary) hypertension: Secondary | ICD-10-CM | POA: Insufficient documentation

## 2015-07-29 DIAGNOSIS — F329 Major depressive disorder, single episode, unspecified: Secondary | ICD-10-CM | POA: Diagnosis present

## 2015-07-29 DIAGNOSIS — Z794 Long term (current) use of insulin: Secondary | ICD-10-CM

## 2015-07-29 DIAGNOSIS — E876 Hypokalemia: Secondary | ICD-10-CM | POA: Diagnosis present

## 2015-07-29 DIAGNOSIS — J45909 Unspecified asthma, uncomplicated: Secondary | ICD-10-CM | POA: Diagnosis present

## 2015-07-29 DIAGNOSIS — K219 Gastro-esophageal reflux disease without esophagitis: Secondary | ICD-10-CM | POA: Diagnosis present

## 2015-07-29 DIAGNOSIS — R739 Hyperglycemia, unspecified: Secondary | ICD-10-CM | POA: Diagnosis not present

## 2015-07-29 DIAGNOSIS — I610 Nontraumatic intracerebral hemorrhage in hemisphere, subcortical: Principal | ICD-10-CM | POA: Diagnosis present

## 2015-07-29 DIAGNOSIS — R059 Cough, unspecified: Secondary | ICD-10-CM

## 2015-07-29 DIAGNOSIS — I69359 Hemiplegia and hemiparesis following cerebral infarction affecting unspecified side: Secondary | ICD-10-CM | POA: Diagnosis not present

## 2015-07-29 DIAGNOSIS — I509 Heart failure, unspecified: Secondary | ICD-10-CM

## 2015-07-29 DIAGNOSIS — R05 Cough: Secondary | ICD-10-CM

## 2015-07-29 DIAGNOSIS — E669 Obesity, unspecified: Secondary | ICD-10-CM | POA: Diagnosis present

## 2015-07-29 DIAGNOSIS — K3184 Gastroparesis: Secondary | ICD-10-CM | POA: Diagnosis present

## 2015-07-29 DIAGNOSIS — Z885 Allergy status to narcotic agent status: Secondary | ICD-10-CM | POA: Diagnosis not present

## 2015-07-29 DIAGNOSIS — Z87891 Personal history of nicotine dependence: Secondary | ICD-10-CM | POA: Diagnosis not present

## 2015-07-29 DIAGNOSIS — W1839XA Other fall on same level, initial encounter: Secondary | ICD-10-CM | POA: Diagnosis present

## 2015-07-29 DIAGNOSIS — Z7902 Long term (current) use of antithrombotics/antiplatelets: Secondary | ICD-10-CM

## 2015-07-29 DIAGNOSIS — I6789 Other cerebrovascular disease: Secondary | ICD-10-CM

## 2015-07-29 DIAGNOSIS — R471 Dysarthria and anarthria: Secondary | ICD-10-CM | POA: Diagnosis present

## 2015-07-29 DIAGNOSIS — R269 Unspecified abnormalities of gait and mobility: Secondary | ICD-10-CM | POA: Diagnosis not present

## 2015-07-29 DIAGNOSIS — B370 Candidal stomatitis: Secondary | ICD-10-CM | POA: Diagnosis present

## 2015-07-29 DIAGNOSIS — E1143 Type 2 diabetes mellitus with diabetic autonomic (poly)neuropathy: Secondary | ICD-10-CM | POA: Diagnosis present

## 2015-07-29 DIAGNOSIS — G8194 Hemiplegia, unspecified affecting left nondominant side: Secondary | ICD-10-CM | POA: Diagnosis present

## 2015-07-29 DIAGNOSIS — Z7984 Long term (current) use of oral hypoglycemic drugs: Secondary | ICD-10-CM | POA: Diagnosis not present

## 2015-07-29 DIAGNOSIS — E1165 Type 2 diabetes mellitus with hyperglycemia: Secondary | ICD-10-CM | POA: Diagnosis present

## 2015-07-29 DIAGNOSIS — R29719 NIHSS score 19: Secondary | ICD-10-CM | POA: Diagnosis present

## 2015-07-29 DIAGNOSIS — E7800 Pure hypercholesterolemia, unspecified: Secondary | ICD-10-CM | POA: Diagnosis present

## 2015-07-29 DIAGNOSIS — W1809XA Striking against other object with subsequent fall, initial encounter: Secondary | ICD-10-CM

## 2015-07-29 DIAGNOSIS — D72829 Elevated white blood cell count, unspecified: Secondary | ICD-10-CM | POA: Diagnosis present

## 2015-07-29 DIAGNOSIS — E1159 Type 2 diabetes mellitus with other circulatory complications: Secondary | ICD-10-CM | POA: Diagnosis not present

## 2015-07-29 DIAGNOSIS — R4701 Aphasia: Secondary | ICD-10-CM | POA: Diagnosis present

## 2015-07-29 DIAGNOSIS — G43109 Migraine with aura, not intractable, without status migrainosus: Secondary | ICD-10-CM | POA: Diagnosis present

## 2015-07-29 DIAGNOSIS — Z6835 Body mass index (BMI) 35.0-35.9, adult: Secondary | ICD-10-CM | POA: Diagnosis not present

## 2015-07-29 DIAGNOSIS — Z79899 Other long term (current) drug therapy: Secondary | ICD-10-CM | POA: Diagnosis not present

## 2015-07-29 DIAGNOSIS — I639 Cerebral infarction, unspecified: Secondary | ICD-10-CM

## 2015-07-29 DIAGNOSIS — I161 Hypertensive emergency: Secondary | ICD-10-CM | POA: Diagnosis present

## 2015-07-29 DIAGNOSIS — R414 Neurologic neglect syndrome: Secondary | ICD-10-CM | POA: Diagnosis present

## 2015-07-29 LAB — DIFFERENTIAL
Basophils Absolute: 0 10*3/uL (ref 0.0–0.1)
Basophils Relative: 0 %
EOS ABS: 0 10*3/uL (ref 0.0–0.7)
EOS PCT: 0 %
Lymphocytes Relative: 7 %
Lymphs Abs: 0.9 10*3/uL (ref 0.7–4.0)
MONOS PCT: 4 %
Monocytes Absolute: 0.5 10*3/uL (ref 0.1–1.0)
Neutro Abs: 11 10*3/uL — ABNORMAL HIGH (ref 1.7–7.7)
Neutrophils Relative %: 89 %

## 2015-07-29 LAB — URINALYSIS, ROUTINE W REFLEX MICROSCOPIC
Bilirubin Urine: NEGATIVE
Ketones, ur: 15 mg/dL — AB
Leukocytes, UA: NEGATIVE
Nitrite: NEGATIVE
PH: 6.5 (ref 5.0–8.0)
Protein, ur: >300 mg/dL — AB
SPECIFIC GRAVITY, URINE: 1.027 (ref 1.005–1.030)
Urobilinogen, UA: 0.2 mg/dL (ref 0.0–1.0)

## 2015-07-29 LAB — RAPID URINE DRUG SCREEN, HOSP PERFORMED
AMPHETAMINES: NOT DETECTED
BENZODIAZEPINES: POSITIVE — AB
Barbiturates: NOT DETECTED
COCAINE: NOT DETECTED
OPIATES: NOT DETECTED
Tetrahydrocannabinol: NOT DETECTED

## 2015-07-29 LAB — COMPREHENSIVE METABOLIC PANEL
ALT: 27 U/L (ref 14–54)
ANION GAP: 18 — AB (ref 5–15)
AST: 43 U/L — ABNORMAL HIGH (ref 15–41)
Albumin: 4.3 g/dL (ref 3.5–5.0)
Alkaline Phosphatase: 41 U/L (ref 38–126)
BILIRUBIN TOTAL: 0.7 mg/dL (ref 0.3–1.2)
BUN: 11 mg/dL (ref 6–20)
CALCIUM: 9.6 mg/dL (ref 8.9–10.3)
CO2: 22 mmol/L (ref 22–32)
Chloride: 101 mmol/L (ref 101–111)
Creatinine, Ser: 0.56 mg/dL (ref 0.44–1.00)
GLUCOSE: 217 mg/dL — AB (ref 65–99)
POTASSIUM: 3.9 mmol/L (ref 3.5–5.1)
Sodium: 141 mmol/L (ref 135–145)
Total Protein: 7.8 g/dL (ref 6.5–8.1)

## 2015-07-29 LAB — I-STAT CHEM 8, ED
BUN: 15 mg/dL (ref 6–20)
CREATININE: 0.4 mg/dL — AB (ref 0.44–1.00)
Calcium, Ion: 1.07 mmol/L — ABNORMAL LOW (ref 1.12–1.23)
Chloride: 99 mmol/L — ABNORMAL LOW (ref 101–111)
Glucose, Bld: 228 mg/dL — ABNORMAL HIGH (ref 65–99)
HEMATOCRIT: 46 % (ref 36.0–46.0)
HEMOGLOBIN: 15.6 g/dL — AB (ref 12.0–15.0)
Potassium: 3.8 mmol/L (ref 3.5–5.1)
Sodium: 139 mmol/L (ref 135–145)
TCO2: 26 mmol/L (ref 0–100)

## 2015-07-29 LAB — CBC
HEMATOCRIT: 42.5 % (ref 36.0–46.0)
Hemoglobin: 14.2 g/dL (ref 12.0–15.0)
MCH: 29.1 pg (ref 26.0–34.0)
MCHC: 33.4 g/dL (ref 30.0–36.0)
MCV: 87.1 fL (ref 78.0–100.0)
Platelets: 161 10*3/uL (ref 150–400)
RBC: 4.88 MIL/uL (ref 3.87–5.11)
RDW: 13.8 % (ref 11.5–15.5)
WBC: 12.4 10*3/uL — ABNORMAL HIGH (ref 4.0–10.5)

## 2015-07-29 LAB — URINE MICROSCOPIC-ADD ON

## 2015-07-29 LAB — PROTIME-INR
INR: 1.05 (ref 0.00–1.49)
Prothrombin Time: 13.9 seconds (ref 11.6–15.2)

## 2015-07-29 LAB — I-STAT TROPONIN, ED: Troponin i, poc: 0 ng/mL (ref 0.00–0.08)

## 2015-07-29 LAB — ETHANOL

## 2015-07-29 LAB — GLUCOSE, CAPILLARY
GLUCOSE-CAPILLARY: 201 mg/dL — AB (ref 65–99)
Glucose-Capillary: 190 mg/dL — ABNORMAL HIGH (ref 65–99)
Glucose-Capillary: 197 mg/dL — ABNORMAL HIGH (ref 65–99)
Glucose-Capillary: 220 mg/dL — ABNORMAL HIGH (ref 65–99)

## 2015-07-29 LAB — APTT: aPTT: 26 seconds (ref 24–37)

## 2015-07-29 LAB — MRSA PCR SCREENING: MRSA BY PCR: NEGATIVE

## 2015-07-29 MED ORDER — ONDANSETRON HCL 4 MG/2ML IJ SOLN
4.0000 mg | Freq: Four times a day (QID) | INTRAMUSCULAR | Status: DC | PRN
  Administered 2015-07-30: 4 mg via INTRAVENOUS
  Filled 2015-07-29: qty 2

## 2015-07-29 MED ORDER — LABETALOL HCL 5 MG/ML IV SOLN
10.0000 mg | INTRAVENOUS | Status: DC | PRN
  Administered 2015-07-29 (×3): 20 mg via INTRAVENOUS
  Administered 2015-07-29: 40 mg via INTRAVENOUS
  Administered 2015-07-29: 20 mg via INTRAVENOUS
  Filled 2015-07-29 (×2): qty 4
  Filled 2015-07-29: qty 8
  Filled 2015-07-29: qty 4
  Filled 2015-07-29: qty 8

## 2015-07-29 MED ORDER — SODIUM CHLORIDE 0.9 % IV SOLN
INTRAVENOUS | Status: DC
  Administered 2015-07-29 – 2015-08-07 (×10): via INTRAVENOUS

## 2015-07-29 MED ORDER — INSULIN ASPART 100 UNIT/ML ~~LOC~~ SOLN
0.0000 [IU] | Freq: Three times a day (TID) | SUBCUTANEOUS | Status: DC
  Administered 2015-07-29: 5 [IU] via SUBCUTANEOUS
  Administered 2015-07-29: 3 [IU] via SUBCUTANEOUS
  Administered 2015-07-30: 5 [IU] via SUBCUTANEOUS
  Administered 2015-07-30 (×2): 3 [IU] via SUBCUTANEOUS
  Administered 2015-07-31 (×2): 5 [IU] via SUBCUTANEOUS
  Administered 2015-07-31 – 2015-08-02 (×5): 3 [IU] via SUBCUTANEOUS
  Administered 2015-08-02: 2 [IU] via SUBCUTANEOUS
  Administered 2015-08-02 – 2015-08-03 (×3): 3 [IU] via SUBCUTANEOUS
  Administered 2015-08-03: 2 [IU] via SUBCUTANEOUS
  Administered 2015-08-04: 5 [IU] via SUBCUTANEOUS
  Administered 2015-08-04: 3 [IU] via SUBCUTANEOUS
  Administered 2015-08-04: 2 [IU] via SUBCUTANEOUS
  Administered 2015-08-05 (×3): 3 [IU] via SUBCUTANEOUS
  Administered 2015-08-06: 5 [IU] via SUBCUTANEOUS
  Administered 2015-08-06: 2 [IU] via SUBCUTANEOUS
  Administered 2015-08-06 – 2015-08-07 (×3): 3 [IU] via SUBCUTANEOUS
  Administered 2015-08-07: 5 [IU] via SUBCUTANEOUS

## 2015-07-29 MED ORDER — ACETAMINOPHEN 650 MG RE SUPP
650.0000 mg | RECTAL | Status: DC | PRN
  Administered 2015-07-30 – 2015-08-01 (×4): 650 mg via RECTAL
  Filled 2015-07-29 (×4): qty 1

## 2015-07-29 MED ORDER — ONDANSETRON HCL 4 MG/2ML IJ SOLN
4.0000 mg | Freq: Once | INTRAMUSCULAR | Status: AC
  Administered 2015-07-29: 4 mg via INTRAVENOUS

## 2015-07-29 MED ORDER — ONDANSETRON HCL 4 MG/2ML IJ SOLN
4.0000 mg | Freq: Once | INTRAMUSCULAR | Status: AC
  Administered 2015-07-29: 4 mg via INTRAVENOUS
  Filled 2015-07-29: qty 2

## 2015-07-29 MED ORDER — CLEVIDIPINE BUTYRATE 0.5 MG/ML IV EMUL
0.0000 mg/h | INTRAVENOUS | Status: DC
  Administered 2015-07-29: 1 mg/h via INTRAVENOUS
  Administered 2015-07-30: 4 mg/h via INTRAVENOUS
  Administered 2015-07-30: 6 mg/h via INTRAVENOUS
  Administered 2015-07-30: 4 mg/h via INTRAVENOUS
  Administered 2015-07-31: 14 mg/h via INTRAVENOUS
  Administered 2015-07-31: 11 mg/h via INTRAVENOUS
  Administered 2015-07-31: 12 mg/h via INTRAVENOUS
  Administered 2015-07-31: 14 mg/h via INTRAVENOUS
  Administered 2015-07-31 (×2): 13 mg/h via INTRAVENOUS
  Administered 2015-07-31: 8 mg/h via INTRAVENOUS
  Administered 2015-08-01: 6 mg/h via INTRAVENOUS
  Administered 2015-08-01: 10 mg/h via INTRAVENOUS
  Administered 2015-08-01: 12 mg/h via INTRAVENOUS
  Administered 2015-08-01: 14 mg/h via INTRAVENOUS
  Administered 2015-08-01 (×2): 12 mg/h via INTRAVENOUS
  Administered 2015-08-02: 9 mg/h via INTRAVENOUS
  Administered 2015-08-02: 6 mg/h via INTRAVENOUS
  Filled 2015-07-29 (×13): qty 50

## 2015-07-29 MED ORDER — INSULIN ASPART 100 UNIT/ML ~~LOC~~ SOLN: 0.0000 [IU] | Freq: Three times a day (TID) | SUBCUTANEOUS | Status: DC

## 2015-07-29 MED ORDER — SENNOSIDES-DOCUSATE SODIUM 8.6-50 MG PO TABS
1.0000 | ORAL_TABLET | Freq: Two times a day (BID) | ORAL | Status: DC
  Administered 2015-07-30 – 2015-08-07 (×10): 1 via ORAL
  Filled 2015-07-29 (×14): qty 1

## 2015-07-29 MED ORDER — STROKE: EARLY STAGES OF RECOVERY BOOK
Freq: Once | Status: AC
  Administered 2015-07-29: 13:00:00
  Filled 2015-07-29: qty 1

## 2015-07-29 MED ORDER — LORAZEPAM 2 MG/ML IJ SOLN: 0.5000 mg | Freq: Two times a day (BID) | INTRAMUSCULAR | Status: DC

## 2015-07-29 MED ORDER — ONDANSETRON HCL 4 MG/2ML IJ SOLN
INTRAMUSCULAR | Status: AC
  Administered 2015-07-29: 4 mg
  Filled 2015-07-29: qty 2

## 2015-07-29 MED ORDER — LABETALOL HCL 5 MG/ML IV SOLN
INTRAVENOUS | Status: AC
  Filled 2015-07-29: qty 4

## 2015-07-29 MED ORDER — PANTOPRAZOLE SODIUM 40 MG IV SOLR
40.0000 mg | Freq: Every day | INTRAVENOUS | Status: DC
  Administered 2015-07-29: 40 mg via INTRAVENOUS
  Filled 2015-07-29: qty 40

## 2015-07-29 MED ORDER — ACETAMINOPHEN 325 MG PO TABS
650.0000 mg | ORAL_TABLET | ORAL | Status: DC | PRN
  Administered 2015-07-31 – 2015-08-07 (×13): 650 mg via ORAL
  Filled 2015-07-29 (×13): qty 2

## 2015-07-29 MED ORDER — CETYLPYRIDINIUM CHLORIDE 0.05 % MT LIQD
7.0000 mL | Freq: Two times a day (BID) | OROMUCOSAL | Status: DC
  Administered 2015-07-29 – 2015-08-07 (×17): 7 mL via OROMUCOSAL

## 2015-07-29 MED ORDER — LABETALOL HCL 5 MG/ML IV SOLN
10.0000 mg | Freq: Once | INTRAVENOUS | Status: AC
  Administered 2015-07-29: 10 mg via INTRAVENOUS

## 2015-07-29 MED ORDER — ONDANSETRON HCL 4 MG/2ML IJ SOLN
4.0000 mg | Freq: Once | INTRAMUSCULAR | Status: DC
  Filled 2015-07-29: qty 2

## 2015-07-29 NOTE — ED Notes (Signed)
Placed C collar on pt due to fall

## 2015-07-29 NOTE — Progress Notes (Signed)
  Echocardiogram 2D Echocardiogram has been performed.  Darlina Sicilian M 07/29/2015, 1:31 PM

## 2015-07-29 NOTE — Progress Notes (Signed)
Upon assessment at [place] patient had vomited on self. Neuro MD called and ordered 1 dose of Zofran. 4 mg Zofran was administered. Neuro assessment results were unchanged. NIH 16. Will continue to monitor.

## 2015-07-29 NOTE — ED Provider Notes (Signed)
CSN: [id number]     Arrival date & time 07/29/15  7412 History   First MD Initiated Contact with Patient 07/29/15 [phone]     No chief complaint on file.    (Consider location/radiation/quality/duration/timing/severity/associated sxs/prior Treatment) HPI Patient's evaluation started on her arrival, with brief screening, as she entered the building with EMS Per EMS, patient's last seen normal time was at least 10 hours ago. Patient is aphasic, level V caveat. Per EMS, the patient was found on the floor by family members, with bruising to left eye, aphasia. With new left facial droop, left sided weakness, and the patient's use of Plavix, EMS was called. EMS reports that in transport, the patient had elevated blood pressure, and received beta blocker, 10 mg.     Past Medical History  Diagnosis Date  . Hypertension   . Hypercholesterolemia   . Bronchitis   . Pancreatitis   . Complication of anesthesia 12/11/11    "angry, mean, hateful after" endoscopy & colonoscopy  . Asthma ~ 1991  . Pneumonia 06/2011; 07/2011  . Diabetes mellitus type 2, controlled   . GERD (gastroesophageal reflux disease)   . GI bleeding 12/21/11  . Hx of colonoscopy with polypectomy 12/11/11    "took out 7; couldn't get #8, that one was precancerous"  . Depression   . 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 Paralysis gastric   . Kidney stones   . Sleep apnea     uses a cpap-oxygen at night   Past Surgical History  Procedure Laterality Date  . Cesarean section  1981; 1986  . Esophagogastroduodenoscopy  12/11/2011    Procedure: ESOPHAGOGASTRODUODENOSCOPY (EGD);  Surgeon: Missy Sabins, MD;  Location: [facility] ENDOSCOPY;  Service: Endoscopy;  Laterality: N/A;  . Colonoscopy  12/11/2011    Procedure: COLONOSCOPY;  Surgeon: Missy Sabins, MD;  Location: [place];  Service: Endoscopy;  Laterality: N/A;  . Abdominal hysterectomy  2002  . Cholecystectomy  ~ 2002  . Tonsillectomy and  adenoidectomy  1988  . Appendectomy  ~ 2002    "w/hysterectomy"  . Colonoscopy  04/26/2012    Procedure: COLONOSCOPY;  Surgeon: Wonda Horner, MD;  Location: [facility] ENDOSCOPY;  Service: Endoscopy;  Laterality: N/A;  apc  . Hernia repair      Umbilical  . Open reduction internal fixation (orif) distal radial fracture Left 01/07/2015    Procedure: OPEN REDUCTION INTERNAL FIXATION (ORIF) LEFT DISTAL RADIAL FRACTURE;  Surgeon: Milly Jakob, MD;  Location: [place];  Service: Orthopedics;  Laterality: Left;  . Knee arthroscopy with medial menisectomy Right 03/29/2015    Procedure: ARTHROSCOPY KNEE, medial menisectomy, removal of loose body;  Surgeon: Frederik Pear, MD;  Location: [place];  Service: Orthopedics;  Laterality: Right;  RIGHT KNEE ARTHROSCOPY   Family History  Problem Relation Age of Onset  . Colon cancer Sister   . Alzheimer's disease Mother   . Heart disease Father     Enlarged heart   Social History  Substance Use Topics  . Smoking status: Former Smoker -- 0.12 packs/day for 6 years    Types: Cigarettes    Quit date: 12/09/1996  . Smokeless tobacco: Never Used  . Alcohol Use: No   OB History    No data available     Review of Systems  Unable to perform ROS: Patient nonverbal      Allergies  Codeine; Penicillins; Reglan; Xifaxan; Zocor; and Crestor  Home Medications   Prior to Admission medications   Medication Sig Start Date End Date Taking? Authorizing Provider  acetaminophen (TYLENOL) 500 MG tablet Take 1,000 mg by mouth daily as needed for fever or headache (headache).     Historical Provider, MD  amLODipine (NORVASC) 5 MG tablet Take 1 tablet (5 mg total) by mouth daily. 11/08/14   Minus Breeding, MD  calcium carbonate (OS-CAL) 600 MG TABS tablet Take 600 mg by mouth daily.    Historical Provider, MD  Cholecalciferol (VITAMIN D PO) Take 400 Units by mouth daily.     Historical Provider, MD  Choline Fenofibrate (FENOFIBRIC ACID)  135 MG CPDR Take 135 mg by mouth at bedtime.    Historical Provider, MD  clopidogrel (PLAVIX) 75 MG tablet Take 1 tablet (75 mg total) by mouth daily. 10/30/14   Rosalin Hawking, MD  Coenzyme Q10 (CO Q-10) 100 MG CAPS Take 100 mg by mouth at bedtime.    Historical Provider, MD  dicyclomine (BENTYL) 10 MG capsule Take 10 mg by mouth 4 (four) times daily -  before meals and at bedtime.    Historical Provider, MD  dicyclomine (BENTYL) 10 MG capsule Take 10 mg by mouth 30 (thirty) minutes before meals & at bedtime. 04/02/15   Historical Provider, MD  fluconazole (DIFLUCAN) 100 MG tablet TAKE 1 TABLET DAILY 02/04/15   Historical Provider, MD  fluticasone (FLONASE) 50 MCG/ACT nasal spray Place 2 sprays into both nostrils daily as needed for allergies.  11/12/14   Historical Provider, MD  gabapentin (NEURONTIN) 300 MG capsule Take 2 capsules (600 mg total) by mouth 3 (three) times daily. 04/17/15   Wallene Huh, DPM  glipiZIDE (GLUCOTROL XL) 5 MG 24 hr tablet Take 5 mg by mouth daily. 12/04/14 12/04/15  Historical Provider, MD  hydrochlorothiazide (HYDRODIURIL) 25 MG tablet Take 25 mg by mouth daily.    Historical Provider, MD  insulin NPH Human (NOVOLIN N) 100 UNIT/ML injection Inject 44 Units into the skin. 03/26/15 03/25/16  Historical Provider, MD  Insulin NPH Human, Isophane, (NOVOLIN N RELION [place]) Inject 44 Units into the skin at bedtime.    Historical Provider, MD  LORazepam (ATIVAN) 0.5 MG tablet Take 0.5 mg by mouth 4 (four) times daily. FOR ANXIETY    Historical Provider, MD  LORazepam (ATIVAN) 0.5 MG tablet TAKE (1) TABLET EVERY SIX HOURS AS NEEDED. 02/04/15   Historical Provider, MD  losartan (COZAAR) 100 MG tablet Take 100 mg by mouth daily.    Historical Provider, MD  losartan (COZAAR) 100 MG tablet TAKE 1 TABLET DAILY 02/04/15   Historical Provider, MD  metFORMIN (GLUCOPHAGE) 1000 MG tablet Take 1,000 mg by mouth 2 (two) times daily with a meal.    Historical Provider, MD  metoprolol tartrate (LOPRESSOR) 25 MG  tablet Take 1 tablet (25 mg total) by mouth 2 (two) times daily. 10/03/14   Orson Eva, MD  Multiple Vitamin (MULTIVITAMIN WITH MINERALS) TABS tablet Take 1 tablet by mouth daily.    Historical Provider, MD  niacin 250 MG tablet Take 250 mg by mouth. 02/26/15   Historical Provider, MD  nortriptyline (PAMELOR) 50 MG capsule Take 1 capsule (50 mg total) by mouth at bedtime. 07/08/15   Rosalin Hawking, MD  ondansetron (ZOFRAN) 4 MG tablet Take 4 mg by mouth daily as needed for nausea or vomiting.    Historical Provider, MD  oxyCODONE-acetaminophen (ROXICET) 5-325 MG per tablet Take 1 tablet by mouth every 6 (six) hours as needed. 03/29/15  Leighton Parody, PA-C  Pancrelipase, Lip-Prot-Amyl, (CREON) 24000 UNITS CPEP Take 1 capsule by mouth 2 (two) times daily.     Historical Provider, MD  pantoprazole (PROTONIX) 40 MG tablet Take 40 mg by mouth 2 (two) times daily.     Historical Provider, MD  potassium chloride (K-DUR) 10 MEQ tablet Take 10 mEq by mouth every morning.    Historical Provider, MD  pravastatin (PRAVACHOL) 80 MG tablet Take 80 mg by mouth daily. 07/30/14 07/30/15  Historical Provider, MD  pravastatin (PRAVACHOL) 80 MG tablet TAKE 1 TABLET IN THE EVENING 01/22/15   Historical Provider, MD  PROAIR HFA 108 (90 BASE) MCG/ACT inhaler USE 2 PUFFS EVERY 6 HOURS AS NEEDED FOR WHEEZING    Clinton D Young, MD  Probiotic Product (ALIGN PO) Take 1 capsule by mouth daily.    Historical Provider, MD  venlafaxine XR (EFFEXOR-XR) 75 MG 24 hr capsule Take 75 mg by mouth 3 (three) times daily.    Historical Provider, MD  venlafaxine XR (EFFEXOR-XR) 75 MG 24 hr capsule TAKE (1) CAPSULE THREE TIMES DAILY. 04/02/15   Historical Provider, MD   There were no vitals taken for this visit. Physical Exam  Constitutional:  Elderly female, aphasic, with nonsensical verbal production  HENT:  Ecchymotic changes around the left orbit  Eyes:  Patient does not track appropriately  Neck:  With some concern following fall,  patient was placed in cervical collar  Cardiovascular: Normal rate and intact distal pulses.   Pulmonary/Chest: No respiratory distress.  Abdominal: She exhibits no distension.  Neurological: She displays no tremor. A cranial nerve deficit is present. She exhibits abnormal muscle tone. She displays no seizure activity. Coordination abnormal.  Aphasic, w L sided minimal strength, not following commands consistently.   Nursing note and vitals reviewed.   ED Course  Procedures (including critical care time) Labs Review Labs Reviewed  ETHANOL  PROTIME-INR  APTT  CBC  DIFFERENTIAL  COMPREHENSIVE METABOLIC PANEL  URINE RAPID DRUG SCREEN, HOSP PERFORMED  URINALYSIS, ROUTINE W REFLEX MICROSCOPIC (NOT AT [facility])  I-STAT CHEM 8, ED  I-STAT TROPOININ, ED    Imaging Review No results found. I have personally reviewed and evaluated these images and lab results as part of my medical decision-making.   EKG Interpretation None     7:15 AM I discussed the patient's head CT w radiology and again checked on the patient.  BP improved.  Initial imaging did not include neck CT, this is now pending, patient in c-collar.   8:13 AM I reviewed the XR images (portable)  8:30 AM Family present.  She states that the patient was initially verbal, but is now markedly different. MDM   Final diagnoses:  Stroke, hemorrhagic [facility])    Patient presents after being found by family member with new neurologic deficits. Patient is a phasic, with new left-sided deficits. Patient's evaluation conducted with our neurology team. Patient's head CT demonstrated hemorrhagic stroke. Given this hemorrhage, patient had close monitoring, required admission to the ICU, after discussion with our neurology colleagues, radiology colleagues.   CRITICAL CARE Performed by: Carmin Muskrat Total critical care time: 35 Critical care time was exclusive of separately billable procedures and treating other  patients. Critical care was necessary to treat or prevent imminent or life-threatening deterioration. Critical care was time spent personally by me on the following activities: development of treatment plan with patient and/or surrogate as well as nursing, discussions with consultants, evaluation of patient's response to treatment, examination of patient, obtaining history  from patient or surrogate, ordering and performing treatments and interventions, ordering and review of laboratory studies, ordering and review of radiographic studies, pulse oximetry and re-evaluation of patient's condition.   Carmin Muskrat, MD 07/29/15 [phone]

## 2015-07-29 NOTE — ED Notes (Signed)
Placed pt  On 2L [place] due to room air sats 87%. Sats now 95% on 2L.

## 2015-07-29 NOTE — ED Notes (Signed)
Upon arrival, pt covered in vomit, pt also incontinent of bowel and bladder. Pt has been cleaned by RN and NT.

## 2015-07-29 NOTE — ED Notes (Signed)
Pt CBG, 190. Nurse was notified.\ 

## 2015-07-29 NOTE — ED Notes (Signed)
Attempted report 

## 2015-07-29 NOTE — Evaluation (Signed)
Clinical/Bedside Swallow Evaluation Patient Details  Name: Destiny Robinson MRN: [id number] Date of Birth: [birth year]  Today's Date: 07/29/2015 Time: SLP Start Time (ACUTE ONLY): 9892 SLP Stop Time (ACUTE ONLY): 1527 SLP Time Calculation (min) (ACUTE ONLY): 14 min  Past Medical History:  Past Medical History  Diagnosis Date  . Hypertension   . Hypercholesterolemia   . Bronchitis   . Pancreatitis   . Complication of anesthesia 12/11/11    "angry, mean, hateful after" endoscopy & colonoscopy  . Asthma ~ 1991  . Pneumonia 06/2011; 07/2011  . Diabetes mellitus type 2, controlled ([place])   . GERD (gastroesophageal reflux disease)   . GI bleeding 12/21/11  . Hx of colonoscopy with polypectomy 12/11/11    "took out 7; couldn't get #8, that one was precancerous"  . Depression   . Cancer [facility])     "female parts; they got it all when I had hysterectomy"  . Paralysis gastric   . Kidney stones   . Sleep apnea     uses a cpap-oxygen at night   Past Surgical History:  Past Surgical History  Procedure Laterality Date  . Cesarean section  1981; 1986  . Esophagogastroduodenoscopy  12/11/2011    Procedure: ESOPHAGOGASTRODUODENOSCOPY (EGD);  Surgeon: Missy Sabins, MD;  Location: [facility] ENDOSCOPY;  Service: Endoscopy;  Laterality: N/A;  . Colonoscopy  12/11/2011    Procedure: COLONOSCOPY;  Surgeon: Missy Sabins, MD;  Location: [place];  Service: Endoscopy;  Laterality: N/A;  . Abdominal hysterectomy  2002  . Cholecystectomy  ~ 2002  . Tonsillectomy and adenoidectomy  1988  . Appendectomy  ~ 2002    "w/hysterectomy"  . Colonoscopy  04/26/2012    Procedure: COLONOSCOPY;  Surgeon: Wonda Horner, MD;  Location: [facility] ENDOSCOPY;  Service: Endoscopy;  Laterality: N/A;  apc  . Hernia repair      Umbilical  . Open reduction internal fixation (orif) distal radial fracture Left 01/07/2015    Procedure: OPEN REDUCTION INTERNAL FIXATION (ORIF) LEFT DISTAL RADIAL FRACTURE;  Surgeon: Milly Jakob, MD;  Location:  [place];  Service: Orthopedics;  Laterality: Left;  . Knee arthroscopy with medial menisectomy Right 03/29/2015    Procedure: ARTHROSCOPY KNEE, medial menisectomy, removal of loose body;  Surgeon: Frederik Pear, MD;  Location: [place];  Service: Orthopedics;  Laterality: Right;  RIGHT KNEE ARTHROSCOPY   HPI:  59 y.o. female presenting with acute onset left sided weakness. 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    Assessment / Plan / Recommendation Clinical Impression  Bedside swallow evaluation complete. Lethargy is largest contributing factor to aspiration risk at this time as patient requiring moderate cueing for sustained level of alertness, although improving as evaluation progressed. Additionally, patient with c/o nausea which significantly limited po trials to ice chips in which patient presented with intermittent evidence of decreased airway protection which also improved during evaluation, likely as secretions cleared. Recommend NPO with f/u in am for po trials at bedside with hopes that patient will be ready for a po diet or instrumental testing.     Aspiration Risk  Moderate    Diet Recommendation NPO   Medication Administration: Via alternative means    Other  Recommendations Oral Care Recommendations: Oral care QID      Frequency and Duration min 2x/week  2 weeks   Pertinent Vitals/Pain n/a        Swallow Study  General Other Pertinent Information: 59 y.o. female presenting with acute onset left sided weakness. 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  Type of Study: Bedside swallow evaluation Previous Swallow Assessment: none Diet Prior to this Study: NPO Temperature Spikes Noted: No Respiratory Status: Supplemental O2 delivered via (comment)  (nasal cannula) History of Recent Intubation: No Behavior/Cognition: Lethargic/Drowsy;Cooperative Oral Cavity - Dentition: Missing dentition (missing upper dentures, family to bring in 10/25) Self-Feeding Abilities: Needs assist Patient Positioning: Upright in bed Baseline Vocal Quality: Hoarse;Low vocal intensity;Wet Volitional Cough: Weak Volitional Swallow: Able to elicit    Oral/Motor/Sensory Function Overall Oral Motor/Sensory Function: Impaired (definite CN VII impairement,likely CN XII as well (lethargy) Labial ROM: Reduced left Labial Symmetry: Abnormal symmetry left Labial Strength: Reduced   Ice Chips Ice chips: Impaired Presentation: Spoon Oral Phase Impairments: Impaired anterior to posterior transit Oral Phase Functional Implications: Right anterior spillage;Left anterior spillage;Prolonged oral transit Pharyngeal Phase Impairments: Throat Clearing - Immediate;Throat Clearing - Delayed   Thin Liquid Thin Liquid: Not tested    Nectar Thick Nectar Thick Liquid: Not tested   Honey Thick Honey Thick Liquid: Not tested   Puree Puree: Not tested   Solid   GO   Destiny Isa MA, CCC-SLP [phone]  Solid: Not tested       Destiny Robinson 07/29/2015,3:31 PM

## 2015-07-29 NOTE — H&P (Signed)
Admission H&P    Chief Complaint: Left sided weakness  HPI: Destiny Robinson is an 59 y.o. female with a history of multiple medical problems who reports awakening this morning and attempting to get out of bed.  Patient fell and hit the nightstand.  Was unable get off the floor.  EMS was called and patient was brought in as a code stroke.  Initial NIHSS of 19.    Date last known well: Date: 07/28/2015 Time last known well: Time: 22:00 tPA Given: No: ICH  MRankin: 0  ICH Score: 1       Past Medical History  Diagnosis Date  . Hypertension   . Hypercholesterolemia   . Bronchitis   . Pancreatitis   . Complication of anesthesia 12/11/11    "angry, mean, hateful after" endoscopy & colonoscopy  . Asthma ~ 1991  . Pneumonia 06/2011; 07/2011  . Diabetes mellitus type 2, controlled ([place])   . GERD (gastroesophageal reflux disease)   . GI bleeding 12/21/11  . Hx of colonoscopy with polypectomy 12/11/11    "took out 7; couldn't get #8, that one was precancerous"  . Depression   . 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 Paralysis gastric   . Kidney stones   . Sleep apnea     uses a cpap-oxygen at night    Past Surgical History  Procedure Laterality Date  . Cesarean section  1981; 1986  . Esophagogastroduodenoscopy  12/11/2011    Procedure: ESOPHAGOGASTRODUODENOSCOPY (EGD);  Surgeon: Missy Sabins, MD;  Location: [facility] ENDOSCOPY;  Service: Endoscopy;  Laterality: N/A;  . Colonoscopy  12/11/2011    Procedure: COLONOSCOPY;  Surgeon: Missy Sabins, MD;  Location: [place];  Service: Endoscopy;  Laterality: N/A;  . Abdominal hysterectomy  2002  . Cholecystectomy  ~ 2002  . Tonsillectomy and adenoidectomy  1988  . Appendectomy  ~ 2002    "w/hysterectomy"  . Colonoscopy  04/26/2012    Procedure: COLONOSCOPY;  Surgeon: Wonda Horner, MD;  Location: [facility] ENDOSCOPY;  Service: Endoscopy;  Laterality: N/A;  apc  . Hernia repair      Umbilical  . Open reduction internal  fixation (orif) distal radial fracture Left 01/07/2015    Procedure: OPEN REDUCTION INTERNAL FIXATION (ORIF) LEFT DISTAL RADIAL FRACTURE;  Surgeon: Milly Jakob, MD;  Location: [place];  Service: Orthopedics;  Laterality: Left;  . Knee arthroscopy with medial menisectomy Right 03/29/2015    Procedure: ARTHROSCOPY KNEE, medial menisectomy, removal of loose body;  Surgeon: Frederik Pear, MD;  Location: [place];  Service: Orthopedics;  Laterality: Right;  RIGHT KNEE ARTHROSCOPY    Family History  Problem Relation Age of Onset  . Colon cancer Sister   . Alzheimer's disease Mother   . Heart disease Father     Enlarged heart   Social History:  reports that she quit smoking about 18 years ago. Her smoking use included Cigarettes. She has a .72 pack-year smoking history. She has never used smokeless tobacco. She reports that she does not drink alcohol or use illicit drugs.  Allergies:  Allergies  Allergen Reactions  . Codeine Hypertension  . Penicillins Anaphylaxis  . Reglan [Metoclopramide] Swelling and Rash    5MG  MAKES THROAT SWELL  . Xifaxan [Rifaximin] Hives, Shortness Of Breath and Swelling    SOB 550MG  MAKES THROAT SWELL SOB  . Zocor [Simvastatin] Anaphylaxis and Hives  . Crestor [Rosuvastatin] Other (See Comments)  Muscles ache    Medications: Prior to Admission medications   Medication Sig Start Date End Date Taking? Authorizing Provider  calcium carbonate (OS-CAL) 600 MG TABS tablet Take 600 mg by mouth daily.   Yes Historical Provider, MD  clopidogrel (PLAVIX) 75 MG tablet Take 1 tablet (75 mg total) by mouth daily. 10/30/14  Yes Rosalin Hawking, MD  gabapentin (NEURONTIN) 300 MG capsule Take 2 capsules (600 mg total) by mouth 3 (three) times daily. Patient taking differently: Take 300 mg by mouth 3 (three) times daily.  04/17/15  Yes Tamala Fothergill Regal, DPM  glipiZIDE (GLUCOTROL XL) 5 MG 24 hr tablet Take 5 mg by mouth daily. 12/04/14 12/04/15 Yes  Historical Provider, MD  hydrochlorothiazide (HYDRODIURIL) 25 MG tablet Take 25 mg by mouth daily.   Yes Historical Provider, MD  insulin NPH Human (NOVOLIN N) 100 UNIT/ML injection Inject 44 Units into the skin. 03/26/15 03/25/16 Yes Historical Provider, MD  LORazepam (ATIVAN) 0.5 MG tablet Take 0.5 mg by mouth 4 (four) times daily. FOR ANXIETY   Yes Historical Provider, MD  metFORMIN (GLUCOPHAGE) 500 MG tablet Take 500 mg by mouth 2 (two) times daily with a meal.   Yes Historical Provider, MD  metoprolol tartrate (LOPRESSOR) 25 MG tablet Take 1 tablet (25 mg total) by mouth 2 (two) times daily. 10/03/14  Yes Orson Eva, MD  niacin 250 MG tablet Take 250 mg by mouth. 02/26/15  Yes Historical Provider, MD  pravastatin (PRAVACHOL) 80 MG tablet Take 80 mg by mouth daily. 07/30/14 07/30/15 Yes Historical Provider, MD  venlafaxine XR (EFFEXOR-XR) 75 MG 24 hr capsule Take 75 mg by mouth 3 (three) times daily.   Yes Historical Provider, MD  acetaminophen (TYLENOL) 500 MG tablet Take 1,000 mg by mouth daily as needed for fever or headache (headache).     Historical Provider, MD  amLODipine (NORVASC) 5 MG tablet Take 1 tablet (5 mg total) by mouth daily. Patient not taking: Reported on 07/29/2015 11/08/14   Minus Breeding, MD  Cholecalciferol (VITAMIN D PO) Take 400 Units by mouth daily.     Historical Provider, MD  Coenzyme Q10 (CO Q-10) 100 MG CAPS Take 100 mg by mouth at bedtime.    Historical Provider, MD  fluticasone (FLONASE) 50 MCG/ACT nasal spray Place 2 sprays into both nostrils daily as needed for allergies.  11/12/14   Historical Provider, MD  Multiple Vitamin (MULTIVITAMIN WITH MINERALS) TABS tablet Take 1 tablet by mouth daily.    Historical Provider, MD  nortriptyline (PAMELOR) 50 MG capsule Take 1 capsule (50 mg total) by mouth at bedtime. Patient not taking: Reported on 07/29/2015 07/08/15   Rosalin Hawking, MD  oxyCODONE-acetaminophen (ROXICET) 5-325 MG per tablet Take 1 tablet by mouth every 6 (six)  hours as needed. 03/29/15   Leighton Parody, PA-C  PROAIR HFA 108 (90 BASE) MCG/ACT inhaler USE 2 PUFFS EVERY 6 HOURS AS NEEDED FOR WHEEZING    Deneise Lever, MD  Probiotic Product (ALIGN PO) Take 1 capsule by mouth daily.    Historical Provider, MD    ROS: History obtained from the patient  General ROS: negative for - chills, fatigue, fever, night sweats, weight gain or weight loss Psychological ROS: negative for - behavioral disorder, hallucinations, memory difficulties, mood swings or suicidal ideation Ophthalmic ROS: negative for - blurry vision, double vision, eye pain or loss of vision ENT ROS: negative for - epistaxis, nasal discharge, oral lesions, sore throat, tinnitus or vertigo Allergy and Immunology ROS: negative for -  hives or itchy/watery eyes Hematological and Lymphatic ROS: negative for - bleeding problems, bruising or swollen lymph nodes Endocrine ROS: negative for - galactorrhea, hair pattern changes, polydipsia/polyuria or temperature intolerance Respiratory ROS: negative for - cough, hemoptysis, shortness of breath or wheezing Cardiovascular ROS: negative for - chest pain, dyspnea on exertion, edema or irregular heartbeat Gastrointestinal ROS: negative for - abdominal pain, diarrhea, hematemesis, nausea/vomiting or stool incontinence Genito-Urinary ROS: negative for - dysuria, hematuria, incontinence or urinary frequency/urgency Musculoskeletal ROS: left hip pain Neurological ROS: as noted in HPI Dermatological ROS: bruise around left eye  Physical Examination: Blood pressure 156/89, pulse 91, temperature 98.2 F (36.8 C), resp. rate 13, weight 90.266 kg (199 lb), SpO2 96 %.  General Examination: Gen- Lethargic HEENT-  Normocephalic, left raccoon eye.  Normal TM's bilaterally.  Normal auditory canals and external ears. Normal external nose, mucus membranes and septum.  Normal pharynx. Cardiovascular- S1, S2 normal, pulses palpable throughout   Lungs- chest clear,  no wheezing, rales, normal symmetric air entry Abdomen- soft, non-tender; bowel sounds normal; no masses,  no organomegaly Extremities- no edema Lymph-no adenopathy palpable Musculoskeletal-left hip tenderness on palpation Skin-bruising around left eye  Neurological Examination Mental Status: Lethargic, oriented, thought content appropriate.  Speech fluent but dysarthric.  Able to follow 3 step commands without difficulty. Cranial Nerves: II: Discs flat bilaterally; LHH, pupils equal, round, reactive to light and accommodation III,IV, VI: ptosis not present, extra-ocular motions intact bilaterally V,VII: left facial droop, facial light touch sensation normal bilaterally VIII: hearing normal bilaterally IX,X: gag reflex reduced XI: shoulder shrug decreased on the left XII: midline tongue extension Motor: Right : Upper extremity   5/5    Left:     Upper extremity   0/5 with increased tone in the hand  Lower extremity   5/5     Lower extremity   0/5 with external rotation at the hip Sensory: Pinprick and light touch intact throughout, bilaterally Deep Tendon Reflexes: 2+ and symmetric throughout Plantars: Right: upgoing   Left: mute Cerebellar: Normal finger-to-nose and normal heel-to-shin testing on the right.  Unable to perform on the left due to weakness Gait: not tested due to safety concerns    Laboratory Studies:   Basic Metabolic Panel: No results for input(s): NA, K, CL, CO2, GLUCOSE, BUN, CREATININE, CALCIUM, MG, PHOS in the last 168 hours.  Liver Function Tests: No results for input(s): AST, ALT, ALKPHOS, BILITOT, PROT, ALBUMIN in the last 168 hours. No results for input(s): LIPASE, AMYLASE in the last 168 hours. No results for input(s): AMMONIA in the last 168 hours.  CBC: No results for input(s): WBC, NEUTROABS, HGB, HCT, MCV, PLT in the last 168 hours.  Cardiac Enzymes: No results for input(s): CKTOTAL, CKMB, CKMBINDEX, TROPONINI in the last 168  hours.  BNP: Invalid input(s): POCBNP  CBG: No results for input(s): GLUCAP in the last 168 hours.  Microbiology: Results for orders placed or performed during the hospital encounter of 12/21/11  Stool culture     Status: None   Collection Time: 12/22/11  1:00 AM  Result Value Ref Range Status   Specimen Description STOOL  Final   Special Requests NONE  Final   Culture   Final    NO SALMONELLA, SHIGELLA, CAMPYLOBACTER, OR YERSINIA ISOLATED   Report Status 12/25/2011 FINAL  Final  Clostridium Difficile by PCR     Status: None   Collection Time: 12/22/11  1:00 AM  Result Value Ref Range Status   Toxigenic C Difficile by  pcr NEGATIVE NEGATIVE Final    Coagulation Studies: No results for input(s): LABPROT, INR in the last 72 hours.  Urinalysis: No results for input(s): COLORURINE, LABSPEC, PHURINE, GLUCOSEU, HGBUR, BILIRUBINUR, KETONESUR, PROTEINUR, UROBILINOGEN, NITRITE, LEUKOCYTESUR in the last 168 hours.  Invalid input(s): APPERANCEUR  Lipid Panel:     Component Value Date/Time   CHOL 259* 10/02/2014 0415   TRIG 523* 10/02/2014 0415   HDL 33* 10/02/2014 0415   CHOLHDL 7.8 10/02/2014 0415   VLDL UNABLE TO CALCULATE IF TRIGLYCERIDE OVER 400 mg/dL 10/02/2014 0415   LDLCALC UNABLE TO CALCULATE IF TRIGLYCERIDE OVER 400 mg/dL 10/02/2014 0415    HgbA1C:  Lab Results  Component Value Date   HGBA1C 6.6* 10/02/2014    Urine Drug Screen:  No results found for: LABOPIA, COCAINSCRNUR, LABBENZ, AMPHETMU, THCU, LABBARB  Alcohol Level: No results for input(s): ETH in the last 168 hours.   Imaging: No results found.  Assessment: 59 y.o. female presenting with acute onset left sided weakness.  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  Patient with a history of hypertension.  Reports being compliant with medications.  Hypertensive on presentation.  Patient on no anticoagulant therapy.  Will require ICU admission for  further management.    Stroke Risk Factors - diabetes mellitus, hyperlipidemia and hypertension  Plan: 1. HgbA1c, fasting lipid panel 2. MRI, MRA  of the brain without contrast 3. PT consult, OT consult, Speech consult 4. Echocardiogram 5. Carotid dopplers 6. Prophylactic therapy-None 7. NPO until RN stroke swallow screen 8. Telemetry monitoring 9. Frequent neuro checks 10. Soft collar to be placed until neck cleared from fall. 11. CT of the neck, CT of the maxillofacial bones, plain films of the left hip 12. Labetalol for BP management 13. Patient is taking Ativan BID at home.  Will continue here so as not to encounter benzodiazepine withdrawal seizures.   94. Admit to ICU  This patient is critically ill and at significant risk of neurological worsening, death and care requires constant monitoring of vital signs, hemodynamics,respiratory and cardiac monitoring, neurological assessment, discussion with family, other specialists and medical decision making of high complexity. I spent 85 minutes of neurocritical care time  in the care of  this patient.  Alexis Goodell, MD [facility] [phone] 07/29/2015  9:18 AM

## 2015-07-29 NOTE — ED Notes (Signed)
Pt was LSN 10pm last night. Pt family found her on the floor this morning 6am. Pt hit her face on night stand. Pt has bruising to left eye. Pt on plavix. BP 234/134. EMS gave 10 mg labetalol. Pt has left sided  Facial droop, no grip on left arm/left foot, slurred speech, confused on date.

## 2015-07-29 NOTE — Progress Notes (Signed)
VASCULAR LAB PRELIMINARY  PRELIMINARY  PRELIMINARY  PRELIMINARY  Carotid duplex  completed.    Preliminary report:  Bilateral:  1-39% ICA stenosis.  Vertebral artery flow is antegrade.      Destiny Robinson, RVT 07/29/2015, 1:03 PM

## 2015-07-29 NOTE — Code Documentation (Signed)
59 year old female presents to [facility] as Code Stroke.  LSW last pm (10/23) at 2200.  Was found down this AM by family - had fallen hit left eye on nightstand.  Code stroke called in field by [employer].  On arrival cleared for CT scan - NIHSS 19 - CT scan shows bleed.  Dr. Leonel Ramsay and Dr. Doy Mince at bedside.  Has large bruise left eye.  Left hemiparesis - left neglect - left field cut - dysarthric - left facial palsy - left sensory deficit.  She was incontinent of stool and urine.  Evidence on vomiting also noted - placed on nasal cannula for O2 sats 87% - up to 93% on 2 liters.  CBG in field 209 - initial BP 200's - treated with 10 Labetalol in route - now 140-160.  Dr. Doy Mince at bedside.  Handoff to [facility].

## 2015-07-30 ENCOUNTER — Encounter (HOSPITAL_COMMUNITY): Payer: Self-pay

## 2015-07-30 ENCOUNTER — Inpatient Hospital Stay (HOSPITAL_COMMUNITY): Payer: Medicare Other

## 2015-07-30 DIAGNOSIS — I10 Essential (primary) hypertension: Secondary | ICD-10-CM

## 2015-07-30 DIAGNOSIS — E785 Hyperlipidemia, unspecified: Secondary | ICD-10-CM

## 2015-07-30 DIAGNOSIS — E876 Hypokalemia: Secondary | ICD-10-CM

## 2015-07-30 DIAGNOSIS — I615 Nontraumatic intracerebral hemorrhage, intraventricular: Secondary | ICD-10-CM

## 2015-07-30 DIAGNOSIS — E1159 Type 2 diabetes mellitus with other circulatory complications: Secondary | ICD-10-CM

## 2015-07-30 LAB — GLUCOSE, CAPILLARY
GLUCOSE-CAPILLARY: 230 mg/dL — AB (ref 65–99)
Glucose-Capillary: 171 mg/dL — ABNORMAL HIGH (ref 65–99)
Glucose-Capillary: 196 mg/dL — ABNORMAL HIGH (ref 65–99)
Glucose-Capillary: 205 mg/dL — ABNORMAL HIGH (ref 65–99)
Glucose-Capillary: 225 mg/dL — ABNORMAL HIGH (ref 65–99)

## 2015-07-30 LAB — LIPID PANEL
CHOL/HDL RATIO: 5 ratio
Cholesterol: 237 mg/dL — ABNORMAL HIGH (ref 0–200)
HDL: 47 mg/dL (ref >40)
LDL CALC: UNDETERMINED mg/dL (ref 0–99)
Triglycerides: 405 mg/dL — ABNORMAL HIGH (ref <150)
VLDL: UNDETERMINED mg/dL (ref 0–40)

## 2015-07-30 LAB — BASIC METABOLIC PANEL
Anion gap: 17 — ABNORMAL HIGH (ref 5–15)
BUN: 13 mg/dL (ref 6–20)
CHLORIDE: 98 mmol/L — AB (ref 101–111)
CO2: 27 mmol/L (ref 22–32)
CREATININE: 0.61 mg/dL (ref 0.44–1.00)
Calcium: 9.7 mg/dL (ref 8.9–10.3)
GFR calc Af Amer: >60 mL/min (ref >60)
GFR calc non Af Amer: >60 mL/min (ref >60)
GLUCOSE: 225 mg/dL — AB (ref 65–99)
POTASSIUM: 3.3 mmol/L — AB (ref 3.5–5.1)
SODIUM: 142 mmol/L (ref 135–145)

## 2015-07-30 LAB — CBC
HCT: 43.5 % (ref 36.0–46.0)
HEMOGLOBIN: 14.7 g/dL (ref 12.0–15.0)
MCH: 29.5 pg (ref 26.0–34.0)
MCHC: 33.8 g/dL (ref 30.0–36.0)
MCV: 87.2 fL (ref 78.0–100.0)
Platelets: 141 10*3/uL — ABNORMAL LOW (ref 150–400)
RBC: 4.99 MIL/uL (ref 3.87–5.11)
RDW: 14.3 % (ref 11.5–15.5)
WBC: 14.9 10*3/uL — ABNORMAL HIGH (ref 4.0–10.5)

## 2015-07-30 MED ORDER — EZETIMIBE 10 MG PO TABS
10.0000 mg | ORAL_TABLET | Freq: Every day | ORAL | Status: DC
  Administered 2015-07-31 – 2015-08-07 (×8): 10 mg via ORAL
  Filled 2015-07-30 (×9): qty 1

## 2015-07-30 MED ORDER — PRAVASTATIN SODIUM 40 MG PO TABS
80.0000 mg | ORAL_TABLET | Freq: Every day | ORAL | Status: DC
  Administered 2015-07-31 – 2015-08-07 (×8): 80 mg via ORAL
  Filled 2015-07-30: qty 2
  Filled 2015-07-30: qty 1
  Filled 2015-07-30 (×4): qty 2
  Filled 2015-07-30: qty 1
  Filled 2015-07-30: qty 2

## 2015-07-30 MED ORDER — IOHEXOL 350 MG/ML SOLN
50.0000 mL | Freq: Once | INTRAVENOUS | Status: AC | PRN
  Administered 2015-07-30: 50 mL via INTRAVENOUS

## 2015-07-30 MED ORDER — METOPROLOL TARTRATE 25 MG PO TABS
25.0000 mg | ORAL_TABLET | Freq: Two times a day (BID) | ORAL | Status: DC
  Administered 2015-07-30 – 2015-07-31 (×3): 25 mg via ORAL
  Filled 2015-07-30 (×3): qty 1

## 2015-07-30 MED ORDER — PANTOPRAZOLE SODIUM 40 MG PO TBEC
40.0000 mg | DELAYED_RELEASE_TABLET | Freq: Every day | ORAL | Status: DC
  Administered 2015-07-31 – 2015-08-07 (×8): 40 mg via ORAL
  Filled 2015-07-30 (×8): qty 1

## 2015-07-30 MED ORDER — POTASSIUM CHLORIDE 10 MEQ/100ML IV SOLN
10.0000 meq | INTRAVENOUS | Status: AC
  Administered 2015-07-30 (×4): 10 meq via INTRAVENOUS
  Filled 2015-07-30 (×4): qty 100

## 2015-07-30 MED ORDER — PROCHLORPERAZINE EDISYLATE 5 MG/ML IJ SOLN
10.0000 mg | Freq: Four times a day (QID) | INTRAMUSCULAR | Status: DC | PRN
  Administered 2015-07-30 (×2): 10 mg via INTRAVENOUS
  Filled 2015-07-30 (×4): qty 2

## 2015-07-30 MED ORDER — POTASSIUM CHLORIDE CRYS ER 20 MEQ PO TBCR: 40.0000 meq | EXTENDED_RELEASE_TABLET | ORAL | Status: DC

## 2015-07-30 MED ORDER — AMLODIPINE BESYLATE 10 MG PO TABS: 10.0000 mg | ORAL_TABLET | Freq: Every day | ORAL | Status: DC

## 2015-07-30 MED ORDER — AMLODIPINE BESYLATE 5 MG PO TABS: 5.0000 mg | ORAL_TABLET | Freq: Every day | ORAL | Status: DC

## 2015-07-30 NOTE — Care Management Note (Signed)
Case Management Note  Patient Details  Name: Destiny Robinson MRN: [id number] Date of Birth: [birth year]  Subjective/Objective:  Pt admitted on 07/29/15 s/p fall with Rt basal ganglia hemorrhage.  PTA, pt independent, lives alone.                   Action/Plan: Will follow for discharge planning as pt progresses.  Pt remains on BR...will need PT/OT consults when able to tolerate therapy.    Expected Discharge Date:            Expected Discharge Plan:  IP Rehab Facility  In-House Referral:  Clinical Social Work  Discharge planning Services  CM Consult  Post Acute Care Choice:    Choice offered to:     DME Arranged:    DME Agency:     HH Arranged:    [place] Agency:     Status of Service:  In process, will continue to follow  Medicare Important Message Given:    Date Medicare IM Given:    Medicare IM give by:    Date Additional Medicare IM Given:    Additional Medicare Important Message give by:     If discussed at [place] of Stay Meetings, dates discussed:    Additional Comments:  Reinaldo Raddle, RN, BSN  Trauma/Neuro ICU Case Manager [phone]

## 2015-07-30 NOTE — Progress Notes (Signed)
MD aware pt vomited after taking POs and that zofran ordered prn but pt's QTC .53. Orders received to d/c zofran and add compazine IV prn.

## 2015-07-30 NOTE — Evaluation (Addendum)
Speech Language Pathology Evaluation Patient Details Name: Destiny Robinson MRN: [id number] DOB: 06-22-1956 Today's Date: 07/30/2015 Time: 0347-4259 SLP Time Calculation (min) (ACUTE ONLY): 21 min  Problem List:  Patient Active Problem List   Diagnosis Date Noted  . ICH (intracerebral hemorrhage) ([place]) 07/29/2015  . Migraine with aura and without status migrainosus, not intractable 04/15/2015  . Cephalalgia 12/11/2014  . Type 2 diabetes mellitus with other circulatory complications ([place]) 56/38/7564  . Headache 10/30/2014  . Left sided numbness 10/30/2014  . HLD (hyperlipidemia) 10/30/2014  . OSA on CPAP 10/30/2014  . Obesity 10/30/2014  . Essential hypertension   . Left hip pain   . Sensory disturbance 10/02/2014  . Morbid obesity ([place]) 10/02/2014  . Cerebrovascular accident ([place]) 10/01/2014  . CVA (cerebral infarction) 10/01/2014  . Acute bronchitis 05/14/2013  . GERD (gastroesophageal reflux disease) 04/22/2013  . Obstructive sleep apnea 11/19/2012  . Dyspnea on exertion 11/19/2012  . Abdominal pain 12/21/2011  . GIB (gastrointestinal bleeding) 12/21/2011  . Soft tissue infection 12/21/2011  . Depression 12/11/2011  . Nausea, vomiting and diarrhea 12/10/2011  . HTN (hypertension) 12/10/2011  . Diabetes mellitus, type 2 ([place]) 12/10/2011  . Pancreatitis    Past Medical History:  Past Medical History  Diagnosis Date  . Hypertension   . Hypercholesterolemia   . Bronchitis   . Pancreatitis   . Complication of anesthesia 12/11/11    "angry, mean, hateful after" endoscopy & colonoscopy  . Asthma ~ 1991  . Pneumonia 06/2011; 07/2011  . Diabetes mellitus type 2, controlled ([place])   . GERD (gastroesophageal reflux disease)   . GI bleeding 12/21/11  . Hx of colonoscopy with polypectomy 12/11/11    "took out 7; couldn't get #8, that one was precancerous"  . Depression   . Cancer [facility])     "female parts; they got it all when I had hysterectomy"  . Paralysis gastric   . Kidney  stones   . Sleep apnea     uses a cpap-oxygen at night   Past Surgical History:  Past Surgical History  Procedure Laterality Date  . Cesarean section  1981; 1986  . Esophagogastroduodenoscopy  12/11/2011    Procedure: ESOPHAGOGASTRODUODENOSCOPY (EGD);  Surgeon: Missy Sabins, MD;  Location: [facility] ENDOSCOPY;  Service: Endoscopy;  Laterality: N/A;  . Colonoscopy  12/11/2011    Procedure: COLONOSCOPY;  Surgeon: Missy Sabins, MD;  Location: [place];  Service: Endoscopy;  Laterality: N/A;  . Abdominal hysterectomy  2002  . Cholecystectomy  ~ 2002  . Tonsillectomy and adenoidectomy  1988  . Appendectomy  ~ 2002    "w/hysterectomy"  . Colonoscopy  04/26/2012    Procedure: COLONOSCOPY;  Surgeon: Wonda Horner, MD;  Location: [facility] ENDOSCOPY;  Service: Endoscopy;  Laterality: N/A;  apc  . Hernia repair      Umbilical  . Open reduction internal fixation (orif) distal radial fracture Left 01/07/2015    Procedure: OPEN REDUCTION INTERNAL FIXATION (ORIF) LEFT DISTAL RADIAL FRACTURE;  Surgeon: Milly Jakob, MD;  Location: [place];  Service: Orthopedics;  Laterality: Left;  . Knee arthroscopy with medial menisectomy Right 03/29/2015    Procedure: ARTHROSCOPY KNEE, medial menisectomy, removal of loose body;  Surgeon: Frederik Pear, MD;  Location: [place];  Service: Orthopedics;  Laterality: Right;  RIGHT KNEE ARTHROSCOPY   HPI:  59 y.o. female presenting with acute onset left sided weakness. Head CT personally reviewed and shows a right basal ganglia hemorrhage with intraventricular extension.PMH of GERD, PNA,  asthma, HTN.    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 Cognitively, she requires Mod cues for emergent and intellectual awareness of deficits. She has a slow rate  of processing, reduced sustained attention, and left inattention, although basic comprehension appears to be a relative strength. Pt will need acute SLP f/u to maximize functional communication and independence. Pt would benefit from CIR level f/u therapy.    SLP Assessment  Patient needs continued Speech Lanaguage Pathology Services    Follow Up Recommendations  Inpatient Rehab    Frequency and Duration min 2x/week  2 weeks   Pertinent Vitals/Pain Pain Assessment: No/denies pain   SLP Goals  Progression toward goals: Goals met and updated - see care plan Patient/Family Stated Goal: none stated by patient Goal formation: with patient/family Potential to Achieve Goals (ACUTE ONLY): Good  SLP Evaluation Prior Functioning  Cognitive/Linguistic Baseline: Within functional limits   Cognition  Overall Cognitive Status: Impaired/Different from baseline Arousal/Alertness: Awake/alert Orientation Level: Oriented to person;Oriented to place;Oriented to situation;Disoriented to time Attention: Sustained Sustained Attention: Impaired Sustained Attention Impairment: Functional basic Awareness: Impaired Awareness Impairment: Intellectual impairment;Emergent impairment;Anticipatory impairment Problem Solving: Impaired Problem Solving Impairment: Functional basic;Verbal basic Safety/Judgment: Impaired Comments: slow processing speed    Comprehension  Auditory Comprehension Overall Auditory Comprehension: Appears within functional limits for tasks assessed (with basic information)    Expression Expression Primary Mode of Expression: Verbal Verbal Expression Overall Verbal Expression: Appears within functional limits for tasks assessed (but with limited output to assess)   Oral / Motor Oral Motor/Sensory Function Overall Oral Motor/Sensory Function: Impaired (definite CN VII impairment, likely CN XII as well) Motor Speech Overall Motor Speech: Impaired Respiration: Impaired Level of  Impairment: Word Phonation: Other (comment) (difficult to initiate and sustain phonation) Articulation: Impaired Level of Impairment: Word Intelligibility: Intelligibility reduced Word: 50-74% accurate Phrase: 50-74% accurate Motor Speech Errors: Not applicable       Germain Osgood, M.A. CCC-SLP [phone]  Germain Osgood 07/30/2015, 10:02 AM

## 2015-07-30 NOTE — Progress Notes (Signed)
Inpatient Diabetes Program Recommendations  AACE/ADA: New Consensus Statement on Inpatient Glycemic Control (2015)  Target Ranges:  Prepandial:   less than 140 mg/dL      Peak postprandial:   less than 180 mg/dL (1-2 hours)      Critically ill patients:  140 - 180 mg/dL   Results for EMELINE, Taylin Mans (MRN [id number]) as of 07/30/2015 11:47  Ref. Range 07/29/2015 15:22 07/29/2015 21:30 07/29/2015 23:54 07/30/2015 04:36 07/30/2015 07:26  Glucose-Capillary Latest Ref Range: 65-99 mg/dL 201 (H) 220 (H) 205 (H) 230 (H) 225 (H)   Diabetes history: Diabetes Mellitus, Type 2 Outpatient Diabetes medications: NPH 44 units daily, Glipizide 5 mg daily, Metformin 500 mg bid Current orders for Inpatient glycemic control:  Novolog moderate tid with meals  Inpatient Diabetes Program Recommendations:    Please consider adding Levemir 26 units daily (0.3 units/kg) while in the hospital to meet basal insulin needs.  Thanks, Adah Perl, RN, BC-ADM Inpatient Diabetes Coordinator Pager [phone] (8a-5p)

## 2015-07-30 NOTE — Progress Notes (Signed)
Pt has not vomited but still reports nausea. PO meds not given and MD notified. Potassium pill changed to IV per MD order. Will continue to monitor.   Latrelle Dodrill

## 2015-07-30 NOTE — Progress Notes (Signed)
STROKE TEAM PROGRESS NOTE   SUBJECTIVE (INTERVAL HISTORY) Destiny Robinson daughter is at the bedside.  Overall she feels Destiny Robinson condition is stable. Destiny Robinson BP high overnight and got several dose of labetalol. Therefore, she was put on cleviprex infusion for BP control. Na 142 today. Pt awake alert and orientated. Denies HA.    OBJECTIVE Temp:  [97.2 F (36.2 C)-98.5 F (36.9 C)] 98.3 F (36.8 C) (10/25 1132) Pulse Rate:  [81-111] 102 (10/25 1300) Cardiac Rhythm:  [-] Normal sinus rhythm (10/25 1200) Resp:  [13-22] 16 (10/25 1300) BP: (138-179)/(72-103) 150/75 mmHg (10/25 1300) SpO2:  [90 %-100 %] 91 % (10/25 1300)   Recent Labs Lab 07/29/15 2130 07/29/15 2354 07/30/15 0436 07/30/15 0726 07/30/15 1126  GLUCAP 220* 205* 230* 225* 171*    Recent Labs Lab 07/29/15 0659 07/29/15 0718 07/30/15 0211  NA 141 139 142  K 3.9 3.8 3.3*  CL 101 99* 98*  CO2 22  --  27  GLUCOSE 217* 228* 225*  BUN 11 15 13   CREATININE 0.56 0.40* 0.61  CALCIUM 9.6  --  9.7    Recent Labs Lab 07/29/15 0659  AST 43*  ALT 27  ALKPHOS 41  BILITOT 0.7  PROT 7.8  ALBUMIN 4.3    Recent Labs Lab 07/29/15 0659 07/29/15 0718 07/30/15 0211  WBC 12.4*  --  14.9*  NEUTROABS 11.0*  --   --   HGB 14.2 15.6* 14.7  HCT 42.5 46.0 43.5  MCV 87.1  --  87.2  PLT 161  --  141*   No results for input(s): CKTOTAL, CKMB, CKMBINDEX, TROPONINI in the last 168 hours.  Recent Labs  07/29/15 0659  LABPROT 13.9  INR 1.05    Recent Labs  07/29/15 0930  COLORURINE YELLOW  LABSPEC 1.027  PHURINE 6.5  GLUCOSEU >1000*  HGBUR SMALL*  BILIRUBINUR NEGATIVE  KETONESUR 15*  PROTEINUR >300*  UROBILINOGEN 0.2  NITRITE NEGATIVE  LEUKOCYTESUR NEGATIVE       Component Value Date/Time   CHOL 237* 07/30/2015 0211   TRIG 405* 07/30/2015 0211   HDL 47 07/30/2015 0211   CHOLHDL 5.0 07/30/2015 0211   VLDL UNABLE TO CALCULATE IF TRIGLYCERIDE OVER 400 mg/dL 07/30/2015 0211   LDLCALC UNABLE TO CALCULATE IF TRIGLYCERIDE OVER  400 mg/dL 07/30/2015 0211   Lab Results  Component Value Date   HGBA1C 6.6* 10/02/2014      Component Value Date/Time   LABOPIA NONE DETECTED 07/29/2015 0930   COCAINSCRNUR NONE DETECTED 07/29/2015 0930   LABBENZ POSITIVE* 07/29/2015 0930   AMPHETMU NONE DETECTED 07/29/2015 0930   THCU NONE DETECTED 07/29/2015 0930   LABBARB NONE DETECTED 07/29/2015 0930     Recent Labs Lab 07/29/15 0659  ETH <5    I have personally reviewed the radiological images below and agree with the radiology interpretations.  Ct Angio Head and neck W/cm &/or Wo Cm  07/30/2015  IMPRESSION: CT head: Evolving RIGHT thalamic intraparenchymal hematoma, no suspicious enhancement. CTA neck: No acute vascular or hemodynamically significant stenosis. Tortuous vessels compatible with chronic hypertension. CTA head:  No acute large vessel occlusion Moderate to high-grade stenosis RIGHT P2 segment (likely thromboembolic, less likely focal vasospasm). Dolicoectatic intracranial vessels compatible with chronic hypertension.   Ct Head Wo Contrast  07/29/2015  IMPRESSION: 1. Right thalamic hemorrhage with 6 mm of midline shift from right to left. 2. Intraventricular hemorrhage. 3. Old left basal ganglia lacunar infarct. 4. 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    Ct Cervical Spine Wo  Contrast  07/29/2015  IMPRESSION: 1. No maxillofacial fractures. 2. No cervical spine fracture or subluxation. 3. Cervical spine degenerative changes. 4. Mild bilateral carotid artery atheromatous calcifications.   Carotid Doppler  Bilateral: 1-39% ICA stenosis. Vertebral artery flow is antegrade.  2D Echocardiogram  - Left ventricle: The cavity size was normal. There was mild concentric hypertrophy. Systolic function was normal. The estimated ejection fraction was in the range of 60% to 65%. Wall motion was normal; there were no regional wall motion abnormalities. There was an increased relative  contribution of atrial contraction to ventricular filling. Doppler parameters are consistent with abnormal left ventricular relaxation (grade 1 diastolic dysfunction). - Aortic valve: Mildly to moderately calcified annulus. Trileaflet; mildly thickened, mildly calcified leaflets. There was trivial regurgitation. - Mitral valve: Calcified annulus. Minimal diffuse calcification of the anterior leaflet and posterior leaflet. - Atrial septum: There was increased thickness of the septum, consistent with lipomatous hypertrophy. - Pulmonary arteries: PA peak pressure: 32 mm Hg (S).   PHYSICAL EXAM  Temp:  [97.2 F (36.2 C)-98.5 F (36.9 C)] 98.3 F (36.8 C) (10/25 1132) Pulse Rate:  [81-111] 102 (10/25 1300) Resp:  [13-22] 16 (10/25 1300) BP: (138-179)/(72-103) 150/75 mmHg (10/25 1300) SpO2:  [90 %-100 %] 91 % (10/25 1300)  General - Well nourished, well developed, lethargic.  Ophthalmologic - fundi not visualized due to noncooperation.  Cardiovascular - Regular rate and rhythm.  Mental Status -  Level of arousal and orientation to time, place, and person were intact. Language including expression, naming, repetition, comprehension was assessed and found intact, but dysarthria and hypophonia. Left side neglect.   Cranial Nerves II - XII - II - not blinking to visual threat on the left. III, IV, VI - Extraocular movements intact, able to have left gaze, but right eye gaze preference. V - Facial sensation intact bilaterally. VII - left facial droop. VIII - Hearing & vestibular intact bilaterally. X - Palate elevates symmetrically, dysarthria. XI - shoulder shrug decreased on the left. XII - Tongue protrusion intact.  Motor Strength - The patient's strength was 0/5 LUE and LLE, 5/5 RUE and RLE. Bulk was normal and fasciculations were absent.   Motor Tone - Muscle tone was assessed at the neck and appendages and there is significant increased muscle tone on the left  UE and LE.  Reflexes - The patient's reflexes were symmetrical in all extremities and she had no pathological reflexes.  Sensory - Light touch, temperature/pinprick were assessed and were symmetrical.    Coordination - The patient had normal movements in the right hand with no ataxia or dysmetria.  Tremor was absent.  Gait and Station - not tested due to weakness.   ASSESSMENT/PLAN Destiny Robinson is a 59 y.o. female with history of HTN, HLD, DM, diabetic gastroparesis, GIB, obesity, OSA on CPAP admitted for acute onset left weakness causing fall. Symptoms unchanged.    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  CT repeat showed stable ICH  CTA head and neck - right P2 stenosis but otherwise unremarkable  Carotid Doppler  unremarkable  2D Echo  unremarkable  LDL unable to calculate due to TG 405  HgbA1c pedning  SCDs for VTE prophylaxis  Diet clear liquid Room service appropriate?: Yes; Fluid consistency:: Thin   clopidogrel 75 mg daily prior to admission, now on No antithrombotic due to [place]  Patient counseled to  be compliant with Destiny Robinson antithrombotic medications  Ongoing aggressive stroke risk factor management  Therapy recommendations:  pending  Disposition:  pending  Diabetes  HgbA1c pending goal < 7.0  Uncontrolled  CBG monitoring showed hyperglycemia  SSI  DM education  Once po intake better, will put on levemir 26 units as suggested by DM nurse  Hypertension  Home meds:   Amlodipine, HCTZ and metoprolol BP goal < 160 Put back on amlodipine and metoprolol On cleviprex infusion  Stable  Patient counseled to be compliant with Destiny Robinson blood pressure medications  Hyperlipidemia  Home meds:  pravastatin   Currently on pravastatin and zetia  LDL not able to calculate, goal < 70  Add zetia due to high TG  Continue pravastatin and  zetia at discharge  N/V  Hx of diabetic gastropresis  QTc 530  D/c zofran   Add compazine PRN  Other Stroke Risk Factors  obesity  Migraines - on nortriptyline at home, doing well  Complicated migraine - left sided numbness with HA, much better, less frequent  Obstructive sleep apnea, on CPAP at home  Other Active Problems  Hypokalemia - supplement  Mild leukocystosis  Other Pertinent History  GIB history  Hospital day # 1  This patient is critically ill due to [place] with IVH, uncontrolled HTN and at significant risk of neurological worsening, death form hematoma enlargement, obstructive hydrocephalus, hypertensive encephalopathy, brain herniation. This patient's care requires constant monitoring of vital signs, hemodynamics, respiratory and cardiac monitoring, review of multiple databases, neurological assessment, discussion with family, other specialists and medical decision making of high complexity. I spent 40 minutes of neurocritical care time in the care of this patient.  Rosalin Hawking, MD PhD Stroke Neurology 07/30/2015 2:08 PM    To contact Stroke Continuity provider, please refer to [email or web address] After hours, contact General Neurology

## 2015-07-30 NOTE — Progress Notes (Signed)
Speech Language Pathology Treatment: Dysphagia  Patient Details Name: Destiny Robinson MRN: [id number] DOB: [birth year] Today's Date: 07/30/2015 Time: 5053-9767 SLP Time Calculation (min) (ACUTE ONLY): 21 min  Assessment / Plan / Recommendation Clinical Impression  Pt is much more alert this morning, although remains limited by nausea. Pt consumed ice chips and thin liquids by cup/straw with anterior escape noted, but no overt signs of aspiration observed. She consumed small bites of puree without difficulty. Pt is appropriate for clear liquid diet and meds crushed in puree. Will follow up for additional trials of solids as pt can tolerate.   HPI Other Pertinent Information: 59 y.o. female presenting with acute onset left sided weakness. 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    Pertinent Vitals Pain Assessment: No/denies pain (feeling nauseous, RN made aware)  SLP Plan  Goals updated    Recommendations Diet recommendations: Thin liquid (clear liquid diet ) Liquids provided via: Cup;Straw Medication Administration: Crushed with puree Supervision: Patient able to self feed;Full supervision/cueing for compensatory strategies Compensations: Minimize environmental distractions;Slow rate;Small sips/bites Postural Changes and/or Swallow Maneuvers: Seated upright 90 degrees;Upright 30-60 min after meal       Oral Care Recommendations: Oral care BID Follow up Recommendations: Inpatient Rehab Plan: Goals updated    Germain Osgood, M.A. CCC-SLP [phone]  Germain Osgood 07/30/2015, 9:48 AM

## 2015-07-30 NOTE — Progress Notes (Signed)
PT Cancellation Note  Patient Details Name: Destiny Robinson MRN: [id number] DOB: [birth year]   Cancelled Treatment:    Reason Eval/Treat Not Completed: Medical issues which prohibited therapy (MD wants bedrest one more day.  Will check back as pt able. )Thanks.   Irwin Brakeman F 07/30/2015, 9:23 AM [facility] [phone] (pager)

## 2015-07-30 NOTE — Progress Notes (Signed)
Pt vomited while in CT scan. Neuro exam unchanged. A/O x4. Zofran given. MD notified. Awaiting CT angio results

## 2015-07-31 ENCOUNTER — Inpatient Hospital Stay (HOSPITAL_COMMUNITY): Payer: Medicare Other

## 2015-07-31 DIAGNOSIS — R269 Unspecified abnormalities of gait and mobility: Secondary | ICD-10-CM

## 2015-07-31 DIAGNOSIS — R739 Hyperglycemia, unspecified: Secondary | ICD-10-CM

## 2015-07-31 DIAGNOSIS — I69359 Hemiplegia and hemiparesis following cerebral infarction affecting unspecified side: Secondary | ICD-10-CM

## 2015-07-31 DIAGNOSIS — R000 Tachycardia, unspecified: Secondary | ICD-10-CM

## 2015-07-31 DIAGNOSIS — E1140 Type 2 diabetes mellitus with diabetic neuropathy, unspecified: Secondary | ICD-10-CM

## 2015-07-31 DIAGNOSIS — I10 Essential (primary) hypertension: Secondary | ICD-10-CM

## 2015-07-31 DIAGNOSIS — G4733 Obstructive sleep apnea (adult) (pediatric): Secondary | ICD-10-CM

## 2015-07-31 DIAGNOSIS — F329 Major depressive disorder, single episode, unspecified: Secondary | ICD-10-CM

## 2015-07-31 LAB — BASIC METABOLIC PANEL
ANION GAP: 16 — AB (ref 5–15)
BUN: 9 mg/dL (ref 6–20)
CHLORIDE: 101 mmol/L (ref 101–111)
CO2: 24 mmol/L (ref 22–32)
Calcium: 9.4 mg/dL (ref 8.9–10.3)
Creatinine, Ser: 0.48 mg/dL (ref 0.44–1.00)
Glucose, Bld: 224 mg/dL — ABNORMAL HIGH (ref 65–99)
POTASSIUM: 3.6 mmol/L (ref 3.5–5.1)
SODIUM: 141 mmol/L (ref 135–145)

## 2015-07-31 LAB — GLUCOSE, CAPILLARY
GLUCOSE-CAPILLARY: 172 mg/dL — AB (ref 65–99)
GLUCOSE-CAPILLARY: 221 mg/dL — AB (ref 65–99)
GLUCOSE-CAPILLARY: 205 mg/dL — AB (ref 65–99)
GLUCOSE-CAPILLARY: 183 mg/dL — AB (ref 65–99)
Glucose-Capillary: 192 mg/dL — ABNORMAL HIGH (ref 65–99)
Glucose-Capillary: 191 mg/dL — ABNORMAL HIGH (ref 65–99)
Glucose-Capillary: 193 mg/dL — ABNORMAL HIGH (ref 65–99)

## 2015-07-31 LAB — CBC
HCT: 43.4 % (ref 36.0–46.0)
HEMOGLOBIN: 13.9 g/dL (ref 12.0–15.0)
MCH: 28.7 pg (ref 26.0–34.0)
MCHC: 32 g/dL (ref 30.0–36.0)
MCV: 89.7 fL (ref 78.0–100.0)
PLATELETS: 201 10*3/uL (ref 150–400)
RBC: 4.84 MIL/uL (ref 3.87–5.11)
RDW: 14.2 % (ref 11.5–15.5)
WBC: 12.5 10*3/uL — AB (ref 4.0–10.5)

## 2015-07-31 LAB — HEMOGLOBIN A1C
HEMOGLOBIN A1C: 6.7 % — AB (ref 4.8–5.6)
Mean Plasma Glucose: 146 mg/dL

## 2015-07-31 MED ORDER — AMLODIPINE BESYLATE 10 MG PO TABS
10.0000 mg | ORAL_TABLET | Freq: Every day | ORAL | Status: DC
  Administered 2015-07-31 – 2015-08-07 (×8): 10 mg via ORAL
  Filled 2015-07-31 (×8): qty 1

## 2015-07-31 MED ORDER — ONDANSETRON HCL 4 MG/2ML IJ SOLN
4.0000 mg | Freq: Four times a day (QID) | INTRAMUSCULAR | Status: DC | PRN
  Administered 2015-08-03 – 2015-08-05 (×3): 4 mg via INTRAVENOUS
  Filled 2015-07-31 (×3): qty 2

## 2015-07-31 MED ORDER — INSULIN DETEMIR 100 UNIT/ML ~~LOC~~ SOLN
26.0000 [IU] | Freq: Every day | SUBCUTANEOUS | Status: DC
  Administered 2015-07-31 – 2015-08-01 (×2): 26 [IU] via SUBCUTANEOUS
  Filled 2015-07-31 (×2): qty 0.26

## 2015-07-31 MED ORDER — METOCLOPRAMIDE HCL 5 MG/ML IJ SOLN: 10.0000 mg | Freq: Four times a day (QID) | INTRAMUSCULAR | Status: DC

## 2015-07-31 MED ORDER — LISINOPRIL 20 MG PO TABS
20.0000 mg | ORAL_TABLET | Freq: Every day | ORAL | Status: DC
  Administered 2015-07-31: 20 mg via ORAL
  Filled 2015-07-31: qty 1

## 2015-07-31 NOTE — Progress Notes (Signed)
STROKE TEAM PROGRESS NOTE   SUBJECTIVE (INTERVAL HISTORY) Her daughter is at the bedside.  Overall she feels her condition is stable. Her BP high overnight and cleviprex dose increased. Na 141 today. Pt awake alert and orientated. Complains of HA frontal. Still nausea but no vomiting. She had gastroparesis at home. EEG repeat showed normal QTc, will change to zofran.      OBJECTIVE Temp:  [98.2 F (36.8 C)-99.7 F (37.6 C)] 99.4 F (37.4 C) (10/26 0722) Pulse Rate:  [87-116] 116 (10/26 0815) Cardiac Rhythm:  [-] Normal sinus rhythm (10/26 0800) Resp:  [12-35] 18 (10/26 0815) BP: (141-172)/(71-93) 149/71 mmHg (10/26 0815) SpO2:  [86 %-98 %] 92 % (10/26 0815)   Recent Labs Lab 07/30/15 0726 07/30/15 1126 07/30/15 1539 07/30/15 2208 07/31/15 0720  GLUCAP 225* 171* 172* 196* 221*    Recent Labs Lab 07/29/15 0659 07/29/15 0718 07/30/15 0211 07/31/15 0237  NA 141 139 142 141  K 3.9 3.8 3.3* 3.6  CL 101 99* 98* 101  CO2 22  --  27 24  GLUCOSE 217* 228* 225* 224*  BUN 11 15 13 9   CREATININE 0.56 0.40* 0.61 0.48  CALCIUM 9.6  --  9.7 9.4    Recent Labs Lab 07/29/15 0659  AST 43*  ALT 27  ALKPHOS 41  BILITOT 0.7  PROT 7.8  ALBUMIN 4.3    Recent Labs Lab 07/29/15 0659 07/29/15 0718 07/30/15 0211 07/31/15 0237  WBC 12.4*  --  14.9* 12.5*  NEUTROABS 11.0*  --   --   --   HGB 14.2 15.6* 14.7 13.9  HCT 42.5 46.0 43.5 43.4  MCV 87.1  --  87.2 89.7  PLT 161  --  141* 201   No results for input(s): CKTOTAL, CKMB, CKMBINDEX, TROPONINI in the last 168 hours.  Recent Labs  07/29/15 0659  LABPROT 13.9  INR 1.05    Recent Labs  07/29/15 0930  COLORURINE YELLOW  LABSPEC 1.027  PHURINE 6.5  GLUCOSEU >1000*  HGBUR SMALL*  BILIRUBINUR NEGATIVE  KETONESUR 15*  PROTEINUR >300*  UROBILINOGEN 0.2  NITRITE NEGATIVE  LEUKOCYTESUR NEGATIVE       Component Value Date/Time   CHOL 237* 07/30/2015 0211   TRIG 405* 07/30/2015 0211   HDL 47 07/30/2015 0211   CHOLHDL 5.0 07/30/2015 0211   VLDL UNABLE TO CALCULATE IF TRIGLYCERIDE OVER 400 mg/dL 07/30/2015 0211   LDLCALC UNABLE TO CALCULATE IF TRIGLYCERIDE OVER 400 mg/dL 07/30/2015 0211   Lab Results  Component Value Date   HGBA1C 6.7* 07/30/2015      Component Value Date/Time   LABOPIA NONE DETECTED 07/29/2015 0930   COCAINSCRNUR NONE DETECTED 07/29/2015 0930   LABBENZ POSITIVE* 07/29/2015 0930   AMPHETMU NONE DETECTED 07/29/2015 0930   THCU NONE DETECTED 07/29/2015 0930   LABBARB NONE DETECTED 07/29/2015 0930     Recent Labs Lab 07/29/15 0659  ETH <5    I have personally reviewed the radiological images below and agree with the radiology interpretations.  Ct Angio Head and neck W/cm &/or Wo Cm  07/30/2015  IMPRESSION: CT head: Evolving RIGHT thalamic intraparenchymal hematoma, no suspicious enhancement. CTA neck: No acute vascular or hemodynamically significant stenosis. Tortuous vessels compatible with chronic hypertension. CTA head:  No acute large vessel occlusion Moderate to high-grade stenosis RIGHT P2 segment (likely thromboembolic, less likely focal vasospasm). Dolicoectatic intracranial vessels compatible with chronic hypertension.   Ct Head Wo Contrast  07/29/2015  IMPRESSION: 1. Right thalamic hemorrhage with 6 mm of midline  shift from right to left. 2. Intraventricular hemorrhage. 3. Old left basal ganglia lacunar infarct. 4. 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    Ct Cervical Spine Wo Contrast  07/29/2015  IMPRESSION: 1. No maxillofacial fractures. 2. No cervical spine fracture or subluxation. 3. Cervical spine degenerative changes. 4. Mild bilateral carotid artery atheromatous calcifications.   Carotid Doppler  Bilateral: 1-39% ICA stenosis. Vertebral artery flow is antegrade.  2D Echocardiogram  - Left ventricle: The cavity size was normal. There was mild concentric hypertrophy. Systolic function was normal. The estimated ejection  fraction was in the range of 60% to 65%. Wall motion was normal; there were no regional wall motion abnormalities. There was an increased relative contribution of atrial contraction to ventricular filling. Doppler parameters are consistent with abnormal left ventricular relaxation (grade 1 diastolic dysfunction). - Aortic valve: Mildly to moderately calcified annulus. Trileaflet; mildly thickened, mildly calcified leaflets. There was trivial regurgitation. - Mitral valve: Calcified annulus. Minimal diffuse calcification of the anterior leaflet and posterior leaflet. - Atrial septum: There was increased thickness of the septum, consistent with lipomatous hypertrophy. - Pulmonary arteries: PA peak pressure: 32 mm Hg (S).   PHYSICAL EXAM  Temp:  [98.2 F (36.8 C)-99.7 F (37.6 C)] 99.4 F (37.4 C) (10/26 0722) Pulse Rate:  [87-116] 116 (10/26 0815) Resp:  [12-35] 18 (10/26 0815) BP: (141-172)/(71-93) 149/71 mmHg (10/26 0815) SpO2:  [86 %-98 %] 92 % (10/26 0815)  General - Well nourished, well developed, lethargic.  Ophthalmologic - fundi not visualized due to noncooperation.  Cardiovascular - Regular rate and rhythm.  Mental Status -  Level of arousal and orientation to time, place, and person were intact. Language including expression, naming, repetition, comprehension was assessed and found intact, but dysarthria and hypophonia. Left side neglect.   Cranial Nerves II - XII - II - not blinking to visual threat on the left. III, IV, VI - Extraocular movements intact, able to have left gaze, but right eye gaze preference. V - Facial sensation intact bilaterally. VII - left facial droop. VIII - Hearing & vestibular intact bilaterally. X - Palate elevates symmetrically, dysarthria. XI - shoulder shrug decreased on the left. XII - Tongue protrusion intact.  Motor Strength - The patient's strength was 0/5 LUE and LLE, 5/5 RUE and RLE. Bulk was normal and  fasciculations were absent.   Motor Tone - Muscle tone was assessed at the neck and appendages and there is significant increased muscle tone on the left UE and LE.  Reflexes - The patient's reflexes were symmetrical in all extremities and she had no pathological reflexes.  Sensory - Light touch, temperature/pinprick were assessed and were symmetrical.    Coordination - The patient had normal movements in the right hand with no ataxia or dysmetria.  Tremor was absent.  Gait and Station - not tested due to weakness.   ASSESSMENT/PLAN Ms. KAMILA BRODA is a 59 y.o. female with history of HTN, HLD, DM, diabetic gastroparesis, GIB, obesity, OSA on CPAP admitted for acute onset left weakness causing fall. Symptoms unchanged.    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  CT repeat showed stable ICH  Will repeat CT again in am to evaluate ventricle size  CTA head and neck - right P2 stenosis but otherwise unremarkable  Carotid Doppler  unremarkable  2D Echo  unremarkable  LDL unable to calculate due to  TG 405  HgbA1c 6.7  SCDs for VTE prophylaxis  Diet clear liquid Room service appropriate?: Yes; Fluid consistency:: Thin   clopidogrel 75 mg daily prior to admission, now on No antithrombotic due to [place]  Patient counseled to be compliant with her antithrombotic medications  Ongoing aggressive stroke risk factor management  Therapy recommendations:  pending  Disposition:  pending  Diabetes  HgbA1c pending goal < 7.0  Uncontrolled  CBG monitoring showed hyperglycemia  SSI  DM education  Added levemir 26 units daily as suggested by DM nurse  Hypertension  Home meds:   Amlodipine, HCTZ and metoprolol  BP goal < 160  Put back on amlodipine and metoprolol  Add lisinopril for better BP control  Wean off cleviprex as able  Patient counseled to be  compliant with her blood pressure medications  Hyperlipidemia  Home meds:  pravastatin   Currently on pravastatin and zetia  LDL not able to calculate, goal < 70  Add zetia due to high TG  Continue pravastatin and zetia at discharge  N/V  Hx of diabetic gastropresis  QTc 530->449  Stop compazine, add Zofran  Other Stroke Risk Factors  obesity  Migraines - on nortriptyline at home, doing well  Complicated migraine - left sided numbness with HA, much better, less frequent, on nortriptyline at home  Obstructive sleep apnea, on CPAP at home  Other Active Problems  Hypokalemia - supplement  Mild leukocystosis  Other Pertinent History  GIB history  Hospital day # 2  This patient is critically ill due to [place] with IVH, uncontrolled HTN and at significant risk of neurological worsening, death form hematoma enlargement, obstructive hydrocephalus, hypertensive encephalopathy, brain herniation. This patient's care requires constant monitoring of vital signs, hemodynamics, respiratory and cardiac monitoring, review of multiple databases, neurological assessment, discussion with family, other specialists and medical decision making of high complexity. I spent 35 minutes of neurocritical care time in the care of this patient.  Rosalin Hawking, MD PhD Stroke Neurology 07/31/2015 9:06 AM    To contact Stroke Continuity provider, please refer to [email or web address] After hours, contact General Neurology

## 2015-07-31 NOTE — Consult Note (Addendum)
Physical Medicine and Rehabilitation Consult   Reason for Consult: Left sided weakness, left inattention, dysphagia Referring Physician: Dr. Erlinda Hong    HPI: Destiny Robinson is a 59 y.o. 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 Patient fell while trying to get out of bed, struck the nightstand and was found to have left sided weakness and lethargy. CT head revealed right thalamic hemorrhage with 6 mm right to left midline shift, IVH and ectatic basilar artery.  CT neck and face negative for fracture. 2D echo done revealing EF 60-65% with no wall abnormality, mild to moderate calcified aortic valve and calcified mitral valve. Carotid dopplers without significant stenosis.   CTA head/neck with evolving right thalamic intraparenchymal hematoma, no acute large vessel occlusion and moderate to high grade stenosis R-P2 segment likely thromboembolic.   Blood pressures has been elevated overnight requiring cleviprex drip. Dr. Erlinda Hong feels that patient had right BG bleed due to hypertension and recommended follow up CT in am to evaluate ventricular size. She has had issues with HA and nausea and lethargy, which is resolving. Has been on liquid diet due to nausea and was downgraded to nectar liquids due to signs of aspiration as well as impulsivity.  Patient with delayed processing, decreased attention left inattention and dense left hemiparesis.    Review of Systems  Unable to perform ROS: patient nonverbal     Past Medical History  Diagnosis Date  . Hypertension   . Hypercholesterolemia   . Bronchitis   . Pancreatitis   . Complication of anesthesia 12/11/11    "angry, mean, hateful after" endoscopy & colonoscopy  . Asthma ~ 1991  . Pneumonia 06/2011; 07/2011  . Diabetes mellitus type 2, controlled ([place])   . GERD (gastroesophageal reflux disease)   . GI bleeding 12/21/11  . Hx of  colonoscopy with polypectomy 12/11/11    "took out 7; couldn't get #8, that one was precancerous"  . Depression   . 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 Paralysis gastric   . Kidney stones   . Sleep apnea     uses a cpap-oxygen at night    Past Surgical History  Procedure Laterality Date  . Cesarean section  1981; 1986  . Esophagogastroduodenoscopy  12/11/2011    Procedure: ESOPHAGOGASTRODUODENOSCOPY (EGD);  Surgeon: Missy Sabins, MD;  Location: [facility] ENDOSCOPY;  Service: Endoscopy;  Laterality: N/A;  . Colonoscopy  12/11/2011    Procedure: COLONOSCOPY;  Surgeon: Missy Sabins, MD;  Location: [place];  Service: Endoscopy;  Laterality: N/A;  . Abdominal hysterectomy  2002  . Cholecystectomy  ~ 2002  . Tonsillectomy and adenoidectomy  1988  . Appendectomy  ~ 2002    "w/hysterectomy"  . Colonoscopy  04/26/2012    Procedure: COLONOSCOPY;  Surgeon: Wonda Horner, MD;  Location: [facility] ENDOSCOPY;  Service: Endoscopy;  Laterality: N/A;  apc  . Hernia repair      Umbilical  . Open reduction internal fixation (orif) distal radial fracture Left 01/07/2015    Procedure: OPEN REDUCTION INTERNAL FIXATION (ORIF) LEFT DISTAL RADIAL FRACTURE;  Surgeon: Milly Jakob, MD;  Location: [place];  Service: Orthopedics;  Laterality: Left;  . Knee arthroscopy with medial menisectomy Right 03/29/2015    Procedure: ARTHROSCOPY KNEE, medial menisectomy, removal of loose body;  Surgeon:  Frederik Pear, MD;  Location: [place];  Service: Orthopedics;  Laterality: Right;  RIGHT KNEE ARTHROSCOPY    Family History  Problem Relation Age of Onset  . Colon cancer Sister   . Alzheimer's disease Mother   . Heart disease Father     Enlarged heart    Social History:  Disabled due to neuropathy and gastroparesis. Used to work as a Quarry manager. Takes turns living with brother or sister. Sister disabled but has multiple family members who can assist after discharge.  Per  reports that she quit smoking about 18 years ago. Her smoking use included Cigarettes. She has a .72 pack-year smoking history. She has never used smokeless tobacco. Per reports that she does not drink alcohol or use illicit drugs.    Allergies  Allergen Reactions  . Codeine Hypertension  . Penicillins Anaphylaxis  . Reglan [Metoclopramide] Swelling and Rash    5MG  MAKES THROAT SWELL  . Xifaxan [Rifaximin] Hives, Shortness Of Breath and Swelling    SOB 550MG  MAKES THROAT SWELL SOB  . Zocor [Simvastatin] Anaphylaxis and Hives  . Crestor [Rosuvastatin] Other (See Comments)    Muscles ache    Facility-administered medications prior to admission  Medication Dose Route Frequency Provider Last Rate Last Dose  . triamcinolone acetonide (KENALOG) 10 MG/ML injection 10 mg  10 mg Intramuscular Once Harriet Masson, DPM       Medications Prior to Admission  Medication Sig Dispense Refill  . calcium carbonate (OS-CAL) 600 MG TABS tablet Take 600 mg by mouth daily.    . clopidogrel (PLAVIX) 75 MG tablet Take 1 tablet (75 mg total) by mouth daily. 90 tablet 3  . gabapentin (NEURONTIN) 300 MG capsule Take 2 capsules (600 mg total) by mouth 3 (three) times daily. (Patient taking differently: Take 300 mg by mouth 3 (three) times daily. ) 180 capsule 11  . glipiZIDE (GLUCOTROL XL) 5 MG 24 hr tablet Take 5 mg by mouth daily.    . hydrochlorothiazide (HYDRODIURIL) 25 MG tablet Take 25 mg by mouth daily.    . insulin NPH Human (NOVOLIN N) 100 UNIT/ML injection Inject 44 Units into the skin.    Marland Kitchen LORazepam (ATIVAN) 0.5 MG tablet Take 0.5 mg by mouth 4 (four) times daily. FOR ANXIETY    . metFORMIN (GLUCOPHAGE) 500 MG tablet Take 500 mg by mouth 2 (two) times daily with a meal.    . metoprolol tartrate (LOPRESSOR) 25 MG tablet Take 1 tablet (25 mg total) by mouth 2 (two) times daily. 60 tablet 0  . niacin 250 MG tablet Take 250 mg by mouth.    . pravastatin (PRAVACHOL) 80 MG tablet Take 80 mg by  mouth daily.    Marland Kitchen venlafaxine XR (EFFEXOR-XR) 75 MG 24 hr capsule Take 75 mg by mouth 3 (three) times daily.    Marland Kitchen acetaminophen (TYLENOL) 500 MG tablet Take 1,000 mg by mouth daily as needed for fever or headache (headache).     Marland Kitchen amLODipine (NORVASC) 5 MG tablet Take 1 tablet (5 mg total) by mouth daily. (Patient not taking: Reported on 07/29/2015) 180 tablet 3  . Cholecalciferol (VITAMIN D PO) Take 400 Units by mouth daily.     . Coenzyme Q10 (CO Q-10) 100 MG CAPS Take 100 mg by mouth at bedtime.    . fluticasone (FLONASE) 50 MCG/ACT nasal spray Place 2 sprays into both nostrils daily as needed for allergies.     . Multiple Vitamin (MULTIVITAMIN WITH MINERALS) TABS tablet Take 1  tablet by mouth daily.    . nortriptyline (PAMELOR) 50 MG capsule Take 1 capsule (50 mg total) by mouth at bedtime. (Patient not taking: Reported on 07/29/2015) 30 capsule 6  . oxyCODONE-acetaminophen (ROXICET) 5-325 MG per tablet Take 1 tablet by mouth every 6 (six) hours as needed. 60 tablet 0  . PROAIR HFA 108 (90 BASE) MCG/ACT inhaler USE 2 PUFFS EVERY 6 HOURS AS NEEDED FOR WHEEZING 8.5 g 4  . Probiotic Product (ALIGN PO) Take 1 capsule by mouth daily.      Home: Home Living Family/patient expects to be discharged to:: Inpatient rehab Living Arrangements: Other relatives (sister and brother) Additional Comments: pt lives in double wide with sister, brother lives next door  Functional History: Prior Function Level of Independence: Independent Comments: uses SPC sporadiacally Functional Status:  Mobility: Bed Mobility Overal bed mobility: Needs Assistance, +2 for physical assistance Bed Mobility: Supine to Sit Supine to sit: Max assist, +2 for physical assistance General bed mobility comments: pt did initiate with R UE and R LE Transfers Overall transfer level: Needs assistance Equipment used:  (2 person lift with bed pad) Transfers: Sit to/from Stand, Stand Pivot Transfers Sit to Stand: Max assist, +2  physical assistance Stand pivot transfers: Max assist, +2 physical assistance General transfer comment: used gait belt with bed pad. pt strong push to L, pt attempted to step with R LE however pt unable to move L LE      ADL:    Cognition: Cognition Overall Cognitive Status: Impaired/Different from baseline Arousal/Alertness: Awake/alert Orientation Level: Oriented X4 Attention: Sustained Sustained Attention: Impaired Sustained Attention Impairment: Functional basic Awareness: Impaired Awareness Impairment: Intellectual impairment, Emergent impairment, Anticipatory impairment Problem Solving: Impaired Problem Solving Impairment: Functional basic, Verbal basic Safety/Judgment: Impaired Comments: slow processing speed Cognition Arousal/Alertness: Awake/alert Behavior During Therapy: WFL for tasks assessed/performed Overall Cognitive Status: Impaired/Different from baseline Area of Impairment: Awareness, Problem solving, Safety/judgement Safety/Judgement: Decreased awareness of safety, Decreased awareness of deficits (L sided neglect) Awareness: Intellectual Problem Solving: Slow processing, Decreased initiation, Difficulty sequencing, Requires verbal cues, Requires tactile cues General Comments: pt able to answer all questions, pt unable to track left even with turning head to left, L sided neglect   Blood pressure 155/84, pulse 114, temperature 99.3 F (37.4 C), temperature source Oral, resp. rate 18, height 5\' 2"  (1.575 m), weight 88.8 kg (195 lb 12.3 oz), SpO2 94 %. Physical Exam  Nursing note and vitals reviewed. Constitutional: She appears well-developed and well-nourished.  Obese female up in chair with right gaze preference.   HENT:  Head: Normocephalic.  Ecchymosis and abrasions  Eyes: Right eye exhibits no discharge. Left eye exhibits no discharge. Right conjunctiva is injected. Left conjunctiva has a hemorrhage.  Does not move eyes to left field.  Left periorbital  ecchymosis  Neck: Decreased range of motion present. No tracheal deviation present.  Resistant/unable to turn head to left despite assistance.   Cardiovascular: Regular rhythm.   Tachycardic  Respiratory: Effort normal and breath sounds normal. No respiratory distress. She has no wheezes.  GI: Soft. Bowel sounds are normal. She exhibits no distension. There is no tenderness.  Musculoskeletal: She exhibits edema (LUE). She exhibits no tenderness.  Strength Left side 0/5 grossly Right side 4/5 grossly  Neurological: She is alert.  Flat affect with no verbal output.  Left facial weakness.  Soft voice Left neglect Unable to move eyes beyond midline to left. Had significant delay in activation but was able to follow simple motor commands with  tactile cues once activated. Occasional perseverative behavior noted.  Dense left hemiparesis  Skin: Skin is warm and dry.  Psychiatric: Her affect is blunt. Her speech is delayed. She is slowed. Cognition and memory are impaired. She is inattentive.    Results for orders placed or performed during the hospital encounter of 07/29/15 (from the past 24 hour(s))  Glucose, capillary     Status: Abnormal   Collection Time: 07/30/15  3:39 PM  Result Value Ref Range   Glucose-Capillary 172 (H) 65 - 99 mg/dL  Glucose, capillary     Status: Abnormal   Collection Time: 07/30/15 10:08 PM  Result Value Ref Range   Glucose-Capillary 196 (H) 65 - 99 mg/dL  CBC     Status: Abnormal   Collection Time: 07/31/15  2:37 AM  Result Value Ref Range   WBC 12.5 (H) 4.0 - 10.5 K/uL   RBC 4.84 3.87 - 5.11 MIL/uL   Hemoglobin 13.9 12.0 - 15.0 g/dL   HCT 43.4 36.0 - 46.0 %   MCV 89.7 78.0 - 100.0 fL   MCH 28.7 26.0 - 34.0 pg   MCHC 32.0 30.0 - 36.0 g/dL   RDW 14.2 11.5 - 15.5 %   Platelets 201 150 - 400 K/uL  Basic metabolic panel     Status: Abnormal   Collection Time: 07/31/15  2:37 AM  Result Value Ref Range   Sodium 141 135 - 145 mmol/L   Potassium 3.6 3.5 -  5.1 mmol/L   Chloride 101 101 - 111 mmol/L   CO2 24 22 - 32 mmol/L   Glucose, Bld 224 (H) 65 - 99 mg/dL   BUN 9 6 - 20 mg/dL   Creatinine, Ser 0.48 0.44 - 1.00 mg/dL   Calcium 9.4 8.9 - 10.3 mg/dL   GFR calc non Af Amer >60 >60 mL/min   GFR calc Af Amer >60 >60 mL/min   Anion gap 16 (H) 5 - 15  Glucose, capillary     Status: Abnormal   Collection Time: 07/31/15  7:20 AM  Result Value Ref Range   Glucose-Capillary 221 (H) 65 - 99 mg/dL  Glucose, capillary     Status: Abnormal   Collection Time: 07/31/15 11:27 AM  Result Value Ref Range   Glucose-Capillary 192 (H) 65 - 99 mg/dL   Ct Angio Head W/cm &/or Wo Cm  07/30/2015  CLINICAL DATA:  Found down on floor after hitting face on night stand. Patient on Plavix with LEFT-sided facial droop, no strength LEFT extremities, slurred speech and confusion. History of hypertension, diabetes and cancer. EXAM: CT ANGIOGRAPHY HEAD AND NECK TECHNIQUE: Multidetector CT imaging of the head and neck was performed using the standard protocol during bolus administration of intravenous contrast. Multiplanar CT image reconstructions and MIPs were obtained to evaluate the vascular anatomy. Carotid stenosis measurements (when applicable) are obtained utilizing NASCET criteria, using the distal internal carotid diameter as the denominator. CONTRAST:  86mL OMNIPAQUE IOHEXOL 350 MG/ML SOLN COMPARISON:  CT head and cervical spine July 29, 2015 FINDINGS: CT HEAD Re- demonstration of RIGHT thalamic intraparenchymal hematoma with intraventricular extension, mild hydrocephalus without suspicious intracranial enhancement. LEFT periorbital soft tissue swelling/hematoma without postseptal extent. CTA NECK Normal appearance of the thoracic arch, normal branch pattern. The origins of the innominate, left Common carotid artery and subclavian artery are widely patent. Tortuous proximal LEFT subclavian artery. Bilateral Common carotid arteries are widely patent, coursing in a  straight line fashion. Normal appearance of the carotid bifurcations without hemodynamically significant stenosis  by NASCET criteria. Mild eccentric calcific atherosclerosis Normal appearance of the included internal carotid arteries. Minimal calcific atherosclerosis of mid RIGHT cervical internal carotid artery. Left vertebral artery is dominant, minimal eccentric calcific atherosclerosis. Normal appearance of the vertebral arteries, which appear widely patent. No dissection, no pseudoaneurysm. No abnormal luminal irregularity. No contrast extravasation. Tortuous anterior and posterior circulation. Soft tissues are nonsuspicious; mildly thickened proximal esophagus may represent under distension. No acute osseous process though bone windows have not been submitted ; cervical spine better characterized on dedicated CT July 29, 2015. CTA HEAD Anterior circulation: Patent cervical internal carotid arteries, petrous, cavernous and supra clinoid internal carotid arteries. Moderate calcific atherosclerosis of the carotid siphons resulting in mild RIGHT para clinoid internal carotid artery stenosis. Widely patent anterior communicating artery. Dolicoectatic appearance of the cerebral vessels. Normal appearance of the anterior and middle cerebral arteries. Supernumerary anterior cerebral artery arises from RIGHT A1-2 junction. Posterior circulation: LEFT vertebral artery is dominant. Ectatic LEFT V4 segment with circumferential calcific atherosclerosis, no focal stenosis. Dolicoectatic basilar artery without discrete aneurysm. Focal moderate to high-grade stenosis RIGHT P2 segment. No large vessel occlusion, hemodynamically significant stenosis, dissection, luminal irregularity, contrast extravasation or aneurysm within the anterior nor posterior circulation. IMPRESSION: CT head: Evolving RIGHT thalamic intraparenchymal hematoma, no suspicious enhancement. CTA neck: No acute vascular or hemodynamically significant  stenosis. Tortuous vessels compatible with chronic hypertension. CTA head:  No acute large vessel occlusion Moderate to high-grade stenosis RIGHT P2 segment (likely thromboembolic, less likely focal vasospasm). Dolicoectatic intracranial vessels compatible with chronic hypertension. Electronically Signed   By: Elon Alas M.D.   On: 07/30/2015 06:02   Ct Angio Neck W/cm &/or Wo/cm  07/30/2015  CLINICAL DATA:  Found down on floor after hitting face on night stand. Patient on Plavix with LEFT-sided facial droop, no strength LEFT extremities, slurred speech and confusion. History of hypertension, diabetes and cancer. EXAM: CT ANGIOGRAPHY HEAD AND NECK TECHNIQUE: Multidetector CT imaging of the head and neck was performed using the standard protocol during bolus administration of intravenous contrast. Multiplanar CT image reconstructions and MIPs were obtained to evaluate the vascular anatomy. Carotid stenosis measurements (when applicable) are obtained utilizing NASCET criteria, using the distal internal carotid diameter as the denominator. CONTRAST:  48mL OMNIPAQUE IOHEXOL 350 MG/ML SOLN COMPARISON:  CT head and cervical spine July 29, 2015 FINDINGS: CT HEAD Re- demonstration of RIGHT thalamic intraparenchymal hematoma with intraventricular extension, mild hydrocephalus without suspicious intracranial enhancement. LEFT periorbital soft tissue swelling/hematoma without postseptal extent. CTA NECK Normal appearance of the thoracic arch, normal branch pattern. The origins of the innominate, left Common carotid artery and subclavian artery are widely patent. Tortuous proximal LEFT subclavian artery. Bilateral Common carotid arteries are widely patent, coursing in a straight line fashion. Normal appearance of the carotid bifurcations without hemodynamically significant stenosis by NASCET criteria. Mild eccentric calcific atherosclerosis Normal appearance of the included internal carotid arteries. Minimal  calcific atherosclerosis of mid RIGHT cervical internal carotid artery. Left vertebral artery is dominant, minimal eccentric calcific atherosclerosis. Normal appearance of the vertebral arteries, which appear widely patent. No dissection, no pseudoaneurysm. No abnormal luminal irregularity. No contrast extravasation. Tortuous anterior and posterior circulation. Soft tissues are nonsuspicious; mildly thickened proximal esophagus may represent under distension. No acute osseous process though bone windows have not been submitted ; cervical spine better characterized on dedicated CT July 29, 2015. CTA HEAD Anterior circulation: Patent cervical internal carotid arteries, petrous, cavernous and supra clinoid internal carotid arteries. Moderate calcific atherosclerosis of the carotid siphons  resulting in mild RIGHT para clinoid internal carotid artery stenosis. Widely patent anterior communicating artery. Dolicoectatic appearance of the cerebral vessels. Normal appearance of the anterior and middle cerebral arteries. Supernumerary anterior cerebral artery arises from RIGHT A1-2 junction. Posterior circulation: LEFT vertebral artery is dominant. Ectatic LEFT V4 segment with circumferential calcific atherosclerosis, no focal stenosis. Dolicoectatic basilar artery without discrete aneurysm. Focal moderate to high-grade stenosis RIGHT P2 segment. No large vessel occlusion, hemodynamically significant stenosis, dissection, luminal irregularity, contrast extravasation or aneurysm within the anterior nor posterior circulation. IMPRESSION: CT head: Evolving RIGHT thalamic intraparenchymal hematoma, no suspicious enhancement. CTA neck: No acute vascular or hemodynamically significant stenosis. Tortuous vessels compatible with chronic hypertension. CTA head:  No acute large vessel occlusion Moderate to high-grade stenosis RIGHT P2 segment (likely thromboembolic, less likely focal vasospasm). Dolicoectatic intracranial vessels  compatible with chronic hypertension. Electronically Signed   By: Elon Alas M.D.   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  Records reviewed and summated above. Stroke: Continue secondary stroke prophylaxis and Risk Factor Modification listed below   Antiplatelet therapy Blood Pressure Management:  Continue current medication with prn's with permisive HTN per primary team Statin Agent Diabetes management Left sided hemiparesis: fit for orthotics to prevent contractures (resting hand splint during the day and wrist cock up splint at night, PRAFO LLE); PT/OT for mobility, ADL training  Motor recovery: Fluoxetine  1. Does the need for close, 24 hr/day medical supervision in concert with the patient's rehab needs make it unreasonable for this patient to be served in a less intensive setting? Yes 2. Co-Morbidities requiring supervision/potential complications: Tachycardia (monitor in conjunction with therapies and address pain if necessary to maintain HR <70% maximum), tachypnea, DM with neuropathy (monitor and adjust medications in accordance with increased physical activity), HTN (adjust medications with increased exercise tolerance), OSA (Encourage CPAP for adequate energy for therapies), Depression (ensure mood does not limit therapies), morbid obesity (encourage weight loss to increase endurance and overall health) 3. Due to bladder management, safety, skin/wound care, disease management, medication administration, pain management and patient education, does the patient require 24 hr/day rehab nursing? Yes 4. Does the patient require coordinated care of a physician, rehab nurse, PT (1-2 hrs/day, 5 days/week), OT (1-2 hrs/day, 5 days/week) and SLP (1-2 hrs/day, 5 days/week) to address physical and functional deficits in the context of the above medical diagnosis(es)? Yes Addressing  deficits in the following areas: balance, endurance, locomotion, strength, transferring, bowel/bladder control, bathing, dressing, feeding, grooming, toileting, cognition, speech, language, swallowing and psychosocial support 5. Can the patient actively participate in an intensive therapy program of at least 3 hrs of therapy per day at least 5 days per week? Potentially 6. The potential for patient to make measurable gains while on inpatient rehab is good 7. Anticipated functional outcomes upon discharge from inpatient rehab are min assist and mod assist  with PT, supervision and min assist with OT, supervision and min assist with SLP. 8. Estimated rehab length of stay to reach the above functional goals is: 18-21 days.Does the patient have adequate social supports and living environment to accommodate these discharge functional goals? Yes 9. Anticipated D/C setting: Home 10. Anticipated post D/C treatments: HH therapy and Home excercise program 11. Overall Rehab/Functional Prognosis: good  RECOMMENDATIONS: This patient's condition is appropriate for continued rehabilitative care in the following setting: CIR once medically appropriate Patient has agreed to participate in recommended program. Potentially Note that  insurance prior authorization may be required for reimbursement for recommended care.  Comment: Rehab Admissions Coordinator to follow up.  Delice Lesch, MD 07/31/2015

## 2015-07-31 NOTE — Progress Notes (Signed)
Rehab Admissions Coordinator Note:  Patient was screened by Retta Diones for appropriateness for an Inpatient Acute Rehab Consult.  At this time, we are recommending Inpatient Rehab consult.  Jodell Cipro M 07/31/2015, 1:04 PM  I can be reached at [phone].

## 2015-07-31 NOTE — Evaluation (Signed)
Physical Therapy Evaluation Patient Details Name: SONJA MANSEAU MRN: [id number] DOB: [birth year] Today's Date: 07/31/2015   History of Present Illness  CLARITZA JULY is an 59 y.o. female with a history of multiple medical problems who reports awakening this morning and attempting to get out of bed. Patient fell and hit the nightstand. Was unable get off the floor. EMS was called and patient was brought in as a code stroke. Initial NIHSS   Clinical Impression  Pt presenting with L sided neglect, dense L hemiparesis and hypertonicity t/o UE and LE. Pt able to follow commands and verbal respond but demo's vision deficits including inability to track to L and suspect L hemiopnopsia. Pt to strongly benefit from CIR upon d/c for maximal functional recovery.    Follow Up Recommendations CIR;Supervision/Assistance - 24 hour    Equipment Recommendations  None recommended by PT    Recommendations for Other Services Rehab consult     Precautions / Restrictions Precautions Precautions: Fall Restrictions Weight Bearing Restrictions: No      Mobility  Bed Mobility Overal bed mobility: Needs Assistance;+2 for physical assistance Bed Mobility: Supine to Sit     Supine to sit: Max assist;+2 for physical assistance     General bed mobility comments: pt did initiate with R UE and R LE  Transfers Overall transfer level: Needs assistance Equipment used:  (2 person lift with bed pad) Transfers: Sit to/from [employer] Transfers Sit to Stand: Max assist;+2 physical assistance Stand pivot transfers: Max assist;+2 physical assistance       General transfer comment: used gait belt with bed pad. pt strong push to L, pt attempted to step with R LE however pt unable to move L LE  Ambulation/Gait                Stairs            Wheelchair Mobility    Modified Rankin (Stroke Patients Only) Modified Rankin (Stroke Patients Only) Pre-Morbid Rankin Score: No  symptoms Modified Rankin: Severe disability     Balance Overall balance assessment: Needs assistance Sitting-balance support: Single extremity supported;Feet supported Sitting balance-Leahy Scale: Poor Sitting balance - Comments: strong push to L with R UE. pt requires modA to maintain sitting Postural control: Left lateral lean                                   Pertinent Vitals/Pain Pain Assessment: No/denies pain    Home Living Family/patient expects to be discharged to:: Inpatient rehab Living Arrangements: Other relatives (sister and brother)               Additional Comments: pt lives in double wide with sister, brother lives next door    Prior Function Level of Independence: Independent         Comments: uses SPC sporadiacally     Hand Dominance   Dominant Hand: Right    Extremity/Trunk Assessment   Upper Extremity Assessment: LUE deficits/detail       LUE Deficits / Details: grossly 0/5, extensor tone at elbow and flexor tone in wrist/hand   Lower Extremity Assessment: LLE deficits/detail   LLE Deficits / Details: grossly 0/5, extensor tone  Cervical / Trunk Assessment: Normal  Communication   Communication:  (soft spoken, delayed response)  Cognition Arousal/Alertness: Awake/alert Behavior During Therapy: WFL for tasks assessed/performed Overall Cognitive Status: Impaired/Different from baseline Area of Impairment: Awareness;Problem solving;Safety/judgement  Safety/Judgement: Decreased awareness of safety;Decreased awareness of deficits (L sided neglect) Awareness: Intellectual Problem Solving: Slow processing;Decreased initiation;Difficulty sequencing;Requires verbal cues;Requires tactile cues General Comments: pt able to answer all questions, pt unable to track left even with turning head to left, L sided neglect    General Comments      Exercises        Assessment/Plan    PT Assessment Patient needs  continued PT services  PT Diagnosis Difficulty walking;Generalized weakness;Hemiplegia non-dominant side   PT Problem List Decreased strength;Decreased range of motion;Decreased activity tolerance;Decreased balance;Decreased mobility;Decreased coordination;Decreased cognition;Decreased safety awareness;Decreased knowledge of precautions;Impaired sensation;Impaired tone  PT Treatment Interventions DME instruction;Gait training;Stair training;Functional mobility training;Therapeutic activities;Therapeutic exercise;Balance training;Cognitive remediation   PT Goals (Current goals can be found in the Care Plan section) Acute Rehab PT Goals PT Goal Formulation: With patient Time For Goal Achievement: 08/14/15 Potential to Achieve Goals: Good    Frequency Min 4X/week   Barriers to discharge        Co-evaluation               End of Session Equipment Utilized During Treatment: Gait belt Activity Tolerance: Patient tolerated treatment well Patient left: in chair;with call bell/phone within reach Nurse Communication: Mobility status;Need for lift equipment         Time: 6433-2951 PT Time Calculation (min) (ACUTE ONLY): 29 min   Charges:   PT Evaluation $Initial PT Evaluation Tier I: 1 Procedure PT Treatments $Therapeutic Activity: 8-22 mins   PT G Codes:        Kingsley Callander 07/31/2015, 12:54 PM  Kittie Plater, PT, DPT Pager #: [phone] Office #: [phone]

## 2015-07-31 NOTE — Evaluation (Signed)
Occupational Therapy Evaluation Patient Details Name: Destiny Robinson MRN: [id number] DOB: 1956-07-31 Today's Date: 07/31/2015    History of Present Illness Destiny Robinson is an 59 y.o. female with a history of multiple medical problems who reports awakening this morning and attempting to get out of bed. Patient fell and hit the nightstand. Was unable get off the floor. EMS was called and patient was brought in as a code stroke. Initial NIHSS    Clinical Impression   Pt was independent in self care and ambulated intermittently with a cane due to peripheral neuropathy.  Presents with dense L hemiplegia with impaired sensation, L neglect and vision deficits, impaired/slow cognition and impaired balance interfering with ability to perform ADL and mobility.  Pt currently requires +2 assistance.  She will require intensive rehab prior to return home with supportive family.  Will follow acutely.    Follow Up Recommendations  CIR    Equipment Recommendations       Recommendations for Other Services       Precautions / Restrictions Precautions Precautions: Fall Restrictions Weight Bearing Restrictions: No      Mobility Bed Mobility Pt in chair.  Transfers Overall transfer level: Needs assistance  Transfers: Sit to/from Stand Sit to Stand: Max assist;+2 physical assistance        General transfer comment: strong push to L, use of pad and gait belt    Balance Overall balance assessment: Needs assistance Sitting-balance support: Feet supported Sitting balance-Leahy Scale: Poor Sitting balance - Comments: mod assist, strong push toward L with R UE Postural control: Left lateral lean                                  ADL Overall ADL's : Needs assistance/impaired Eating/Feeding: Sitting;Maximal assistance   Grooming: Wash/dry face;Moderate assistance;Sitting   Upper Body Bathing: Total assistance   Lower Body Bathing: Total assistance   Upper Body  Dressing : Total assistance   Lower Body Dressing: Total assistance   Toilet Transfer: +2 for safety/equipment;Maximal assistance;Squat-pivot   Toileting- Clothing Manipulation and Hygiene: Total assistance               Vision Vision Assessment?: Yes Eye Alignment: Within Functional Limits Ocular Range of Motion: Restricted looking up;Restricted looking down;Restricted on the left Alignment/Gaze Preference: Gaze right Tracking/Visual Pursuits: Other (comment);Decreased smoothness of horizontal tracking (can track from R to midline only) Convergence: Impaired (comment) Visual Fields: Left visual field deficit   Perception     Praxis      Pertinent Vitals/Pain Pain Assessment: No/denies pain     Hand Dominance Right   Extremity/Trunk Assessment Upper Extremity Assessment Upper Extremity Assessment: LUE deficits/detail (mild edema in hand, arthritic changes) LUE Deficits / Details: grossly 0/5, extensor tone at elbow and flexor tone in wrist/hand LUE Sensation: decreased light touch;decreased proprioception (deep pressure only of BP cuff) LUE Coordination: decreased fine motor;decreased gross motor (no functional use)      Cervical / Trunk Assessment Cervical / Trunk Assessment: Normal   Communication Communication Communication: No difficulties   Cognition Arousal/Alertness: Awake/alert Behavior During Therapy: Flat affect Overall Cognitive Status: Impaired/Different from baseline Area of Impairment: Awareness;Problem solving;Safety/judgement         Safety/Judgement: Decreased awareness of safety;Decreased awareness of deficits Awareness: Intellectual Problem Solving: Slow processing;Decreased initiation;Difficulty sequencing;Requires verbal cues;Requires tactile cues General Comments: pt able to answer questions appropriately, but slowly   General Comments  Exercises       Shoulder Instructions      Home Living Family/patient expects to be  discharged to:: Inpatient rehab Living Arrangements: Other relatives                               Additional Comments: pt lives in double wide with sister, brother lives next door      Prior Functioning/Environment Level of Independence: Independent        Comments: uses SPC sporadiacally    OT Diagnosis: Generalized weakness;Cognitive deficits;Disturbance of vision;Hemiplegia non-dominant side   OT Problem List: Decreased strength;Decreased range of motion;Decreased activity tolerance;Impaired balance (sitting and/or standing);Impaired vision/perception;Decreased coordination;Decreased cognition;Decreased safety awareness;Decreased knowledge of use of DME or AE;Impaired tone;Impaired sensation;Obesity;Impaired UE functional use;Increased edema   OT Treatment/Interventions: Self-care/ADL training;Neuromuscular education;DME and/or AE instruction;Therapeutic activities;Cognitive remediation/compensation;Visual/perceptual remediation/compensation;Patient/family education;Balance training    OT Goals(Current goals can be found in the care plan section) Acute Rehab OT Goals OT Goal Formulation: With patient/family Time For Goal Achievement: 08/14/15 Potential to Achieve Goals: Fair ADL Goals Pt Will Perform Eating: with min assist;sitting (supported) Pt Will Perform Grooming: with min assist;sitting (supported) Pt/caregiver will Perform Home Exercise Program: Right Upper extremity;Independently (family will will position and perform PROM) Additional ADL Goal #1: Pt will locate food or ADL items L of midline with min verbal cues and head turn Additional ADL Goal #2: Pt will perform bed mobility with moderate assist to help with positioning and care. Additional ADL Goal #3: Pt will sit EOB with min guard assistance x 3 minutes.  OT Frequency: Min 3X/week   Barriers to D/C:            Co-evaluation              End of Session Equipment Utilized During Treatment:  Gait belt;Oxygen  Activity Tolerance: Patient tolerated treatment well Patient left: in chair;with call bell/phone within reach;with family/visitor present   Time: 1130-1155 OT Time Calculation (min): 25 min Charges:  OT General Charges $OT Visit: 1 Procedure OT Evaluation $Initial OT Evaluation Tier I: 1 Procedure OT Treatments $Self Care/Home Management : 8-22 mins G-Codes:    Malka So 07/31/2015, 2:49 PM  [phone]

## 2015-07-31 NOTE — Progress Notes (Signed)
OT Cancellation Note  Patient Details Name: Destiny Robinson MRN: [id number] DOB: August 28, 1956   Cancelled Treatment:    Reason Eval/Treat Not Completed: Patient not medically ready (Pt with bed rest orders.  Will follow.)  Malka So 07/31/2015, 8:15 AM

## 2015-07-31 NOTE — Procedures (Signed)
Objective Swallowing Evaluation: Other (Comment) (FEES)  Patient Details  Name: Destiny Robinson MRN: [id number] Date of Birth: 04/23/1956  Today's Date: 07/31/2015 Time: SLP Start Time (ACUTE ONLY): 1326-SLP Stop Time (ACUTE ONLY): 1351 SLP Time Calculation (min) (ACUTE ONLY): 25 min  Past Medical History:  Past Medical History  Diagnosis Date  . Hypertension   . Hypercholesterolemia   . Bronchitis   . Pancreatitis   . Complication of anesthesia 12/11/11    "angry, mean, hateful after" endoscopy & colonoscopy  . Asthma ~ 1991  . Pneumonia 06/2011; 07/2011  . Diabetes mellitus type 2, controlled ([place])   . GERD (gastroesophageal reflux disease)   . GI bleeding 12/21/11  . Hx of colonoscopy with polypectomy 12/11/11    "took out 7; couldn't get #8, that one was precancerous"  . Depression   . 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 Paralysis gastric   . Kidney stones   . Sleep apnea     uses a cpap-oxygen at night   Past Surgical History:  Past Surgical History  Procedure Laterality Date  . Cesarean section  1981; 1986  . Esophagogastroduodenoscopy  12/11/2011    Procedure: ESOPHAGOGASTRODUODENOSCOPY (EGD);  Surgeon: Missy Sabins, MD;  Location: [facility] ENDOSCOPY;  Service: Endoscopy;  Laterality: N/A;  . Colonoscopy  12/11/2011    Procedure: COLONOSCOPY;  Surgeon: Missy Sabins, MD;  Location: [place];  Service: Endoscopy;  Laterality: N/A;  . Abdominal hysterectomy  2002  . Cholecystectomy  ~ 2002  . Tonsillectomy and adenoidectomy  1988  . Appendectomy  ~ 2002    "w/hysterectomy"  . Colonoscopy  04/26/2012    Procedure: COLONOSCOPY;  Surgeon: Wonda Horner, MD;  Location: [facility] ENDOSCOPY;  Service: Endoscopy;  Laterality: N/A;  apc  . Hernia repair      Umbilical  . Open reduction internal fixation (orif) distal radial fracture Left 01/07/2015    Procedure: OPEN REDUCTION INTERNAL FIXATION (ORIF) LEFT DISTAL RADIAL FRACTURE;  Surgeon: Milly Jakob, MD;  Location: [place];  Service: Orthopedics;  Laterality: Left;  . Knee arthroscopy with medial menisectomy Right 03/29/2015    Procedure: ARTHROSCOPY KNEE, medial menisectomy, removal of loose body;  Surgeon: Frederik Pear, MD;  Location: [place];  Service: Orthopedics;  Laterality: Right;  RIGHT KNEE ARTHROSCOPY   HPI:  Other Pertinent Information: 59 y.o. female presenting with acute onset left sided weakness. 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    Assessment / Plan / Recommendation CHL IP CLINICAL IMPRESSIONS 07/31/2015  Therapy Diagnosis Mild pharyngeal phase dysphagia;Mild oral phase dysphagia;Moderate oral phase dysphagia   Clinical Impression Pt has a mild-moderate oropharyngeal dysphagia secondary to left-sided weakness. Lingual manipulation is weak with slow transit, leading to decreased bolus cohesion for pharyngeal transfer. Pharyngeal weakness results in mild residuals that remain with all consistencies tested. Despite weakness and premature spill, there is no airway compromise observed. Recommend to resume thin liquid (clear liquid) diet. Pt would be appropriate for advancement to Dys 1 diet and thin liquids as nausea improves. SLP to continue to follow.      CHL IP TREATMENT RECOMMENDATION 07/31/2015  Treatment Recommendations Therapy as outlined in treatment plan below     CHL IP DIET RECOMMENDATION 07/31/2015  SLP Diet Recommendations Thin  Liquid Administration via (None)  Medication Administration Crushed with puree  Compensations Minimize  environmental distractions;Slow rate;Small sips/bites  Postural Changes and/or Swallow Maneuvers (None)     CHL IP OTHER RECOMMENDATIONS 07/31/2015  Recommended Consults (None)  Oral Care Recommendations Oral care BID  Other Recommendations (None)     CHL IP FOLLOW UP RECOMMENDATIONS  07/31/2015  Follow up Recommendations Inpatient Rehab     CHL IP FREQUENCY AND DURATION 07/31/2015  Speech Therapy Frequency (ACUTE ONLY) min 2x/week  Treatment Duration 2 weeks     Pertinent Vitals/Pain: n/a     SLP Swallow Goals     CHL IP REASON FOR REFERRAL 07/31/2015  Reason for Referral Objectively evaluate swallowing function     CHL IP ORAL PHASE 07/31/2015  Oral Phase Impaired      CHL IP PHARYNGEAL PHASE 07/31/2015  Pharyngeal Phase Impaired      CHL IP CERVICAL ESOPHAGEAL PHASE 07/31/2015  Cervical Esophageal Phase [facility]         Germain Osgood, M.A. CCC-SLP [phone]  Germain Osgood 07/31/2015, 3:13 PM

## 2015-07-31 NOTE — Progress Notes (Signed)
Speech Language Pathology Treatment: Dysphagia;Cognitive-Linquistic  Patient Details Name: Destiny Robinson MRN: [id number] DOB: [birth year] Today's Date: 07/31/2015 Time: 0915-0930 SLP Time Calculation (min) (ACUTE ONLY): 15 min  Assessment / Plan / Recommendation Clinical Impression  Per sister at bedside, pt had two coughing episodes with breakfast this morning, therefore she held breakfast tray until SLP arrived. Sister admits that she thinks the patient may have been taking in too much volume at a time. No overt coughing was noted with thin liquid trials, although pt required Mod cues for pacing. Mild anterior loss and decreased left-sided labial sensation observed. Pt continues to have difficulty initiating and sustaining phonation, which may also put pt at a higher risk of decreased airway protection. Given the above as well as difficulty with breakfast this morning, recommend diet modification to nectar thick liquids pending FEES to objectively assess oropharyngeal swallow.    HPI Other Pertinent Information: 59 y.o. female presenting with acute onset left sided weakness. 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    Pertinent Vitals Pain Assessment: No/denies pain  SLP Plan  Other (Comment) (tba pending FEES)    Recommendations Diet recommendations: Nectar-thick liquid (clear liquids) Liquids provided via: Cup;Straw Medication Administration: Crushed with puree Supervision: Patient able to self feed;Full supervision/cueing for compensatory strategies Compensations: Minimize environmental distractions;Slow rate;Small sips/bites Postural Changes and/or Swallow Maneuvers: Seated upright 90 degrees;Upright 30-60 min after meal       Oral Care Recommendations: Oral care BID Follow up Recommendations: Inpatient Rehab Plan: Other (Comment) (tba pending FEES)    Germain Osgood, M.A.  CCC-SLP [phone]  Germain Osgood 07/31/2015, 9:39 AM

## 2015-07-31 NOTE — Progress Notes (Signed)
Dr. Aram Beecham notified of slight decrease in responsiveness. Pt. Still able to follow commands however slower to respond and communicate. Order for stat CT given. Order carried out.

## 2015-08-01 LAB — CBC
HEMATOCRIT: 43.6 % (ref 36.0–46.0)
HEMOGLOBIN: 14.1 g/dL (ref 12.0–15.0)
MCH: 28.7 pg (ref 26.0–34.0)
MCHC: 32.3 g/dL (ref 30.0–36.0)
MCV: 88.8 fL (ref 78.0–100.0)
Platelets: 206 10*3/uL (ref 150–400)
RBC: 4.91 MIL/uL (ref 3.87–5.11)
RDW: 14.1 % (ref 11.5–15.5)
WBC: 11.4 10*3/uL — AB (ref 4.0–10.5)

## 2015-08-01 LAB — BASIC METABOLIC PANEL
ANION GAP: 11 (ref 5–15)
BUN: 13 mg/dL (ref 6–20)
CALCIUM: 8.9 mg/dL (ref 8.9–10.3)
CHLORIDE: 104 mmol/L (ref 101–111)
CO2: 24 mmol/L (ref 22–32)
Creatinine, Ser: 0.53 mg/dL (ref 0.44–1.00)
GFR calc Af Amer: >60 mL/min (ref >60)
GFR calc non Af Amer: >60 mL/min (ref >60)
GLUCOSE: 219 mg/dL — AB (ref 65–99)
POTASSIUM: 3.3 mmol/L — AB (ref 3.5–5.1)
Sodium: 139 mmol/L (ref 135–145)

## 2015-08-01 LAB — GLUCOSE, CAPILLARY
GLUCOSE-CAPILLARY: 175 mg/dL — AB (ref 65–99)
GLUCOSE-CAPILLARY: 161 mg/dL — AB (ref 65–99)
GLUCOSE-CAPILLARY: 165 mg/dL — AB (ref 65–99)
Glucose-Capillary: 167 mg/dL — ABNORMAL HIGH (ref 65–99)

## 2015-08-01 MED ORDER — HYDRALAZINE HCL 25 MG PO TABS
25.0000 mg | ORAL_TABLET | Freq: Three times a day (TID) | ORAL | Status: DC
  Administered 2015-08-01 – 2015-08-02 (×2): 25 mg via ORAL
  Filled 2015-08-01: qty 1

## 2015-08-01 MED ORDER — INSULIN DETEMIR 100 UNIT/ML ~~LOC~~ SOLN
35.0000 [IU] | Freq: Every day | SUBCUTANEOUS | Status: DC
  Filled 2015-08-01: qty 0.35

## 2015-08-01 MED ORDER — METOPROLOL TARTRATE 50 MG PO TABS
50.0000 mg | ORAL_TABLET | Freq: Two times a day (BID) | ORAL | Status: DC
  Administered 2015-08-01 – 2015-08-03 (×6): 50 mg via ORAL
  Filled 2015-08-01 (×6): qty 1

## 2015-08-01 MED ORDER — HEPARIN SODIUM (PORCINE) 5000 UNIT/ML IJ SOLN
5000.0000 [IU] | Freq: Three times a day (TID) | INTRAMUSCULAR | Status: DC
  Administered 2015-08-01 – 2015-08-07 (×19): 5000 [IU] via SUBCUTANEOUS
  Filled 2015-08-01 (×20): qty 1

## 2015-08-01 MED ORDER — LISINOPRIL 20 MG PO TABS
20.0000 mg | ORAL_TABLET | Freq: Two times a day (BID) | ORAL | Status: DC
  Administered 2015-08-01 – 2015-08-07 (×13): 20 mg via ORAL
  Filled 2015-08-01 (×14): qty 1

## 2015-08-01 MED ORDER — WHITE PETROLATUM GEL
Status: AC
  Administered 2015-08-01: 11:00:00
  Filled 2015-08-01: qty 1

## 2015-08-01 NOTE — Progress Notes (Addendum)
STROKE TEAM PROGRESS NOTE   SUBJECTIVE (INTERVAL HISTORY) Destiny Robinson daughter is at the bedside.  Overall Destiny Robinson condition is stable. Still on cleviprex and weaning off. Increased BP meds. Had CT overnight and stable. Pt nausea better and no PRN overnight.       OBJECTIVE Temp:  [97.4 F (36.3 C)-100.7 F (38.2 C)] 98.1 F (36.7 C) (10/27 1121) Pulse Rate:  [84-120] 95 (10/27 1400) Cardiac Rhythm:  [-] Normal sinus rhythm (10/27 1200) Resp:  [5-26] 18 (10/27 1400) BP: (118-166)/(60-126) 154/74 mmHg (10/27 1400) SpO2:  [87 %-100 %] 96 % (10/27 1400)   Recent Labs Lab 07/31/15 1845 07/31/15 2013 07/31/15 2234 08/01/15 0737 08/01/15 1120  GLUCAP 205* 183* 193* 167* 175*    Recent Labs Lab 07/29/15 4854 07/29/15 0718 07/30/15 0211 07/31/15 0237 08/01/15 0233  NA 141 139 142 141 139  K 3.9 3.8 3.3* 3.6 3.3*  CL 101 99* 98* 101 104  CO2 22  --  27 24 24   GLUCOSE 217* 228* 225* 224* 219*  BUN 11 15 13 9 13   CREATININE 0.56 0.40* 0.61 0.48 0.53  CALCIUM 9.6  --  9.7 9.4 8.9    Recent Labs Lab 07/29/15 0659  AST 43*  ALT 27  ALKPHOS 41  BILITOT 0.7  PROT 7.8  ALBUMIN 4.3    Recent Labs Lab 07/29/15 0659 07/29/15 0718 07/30/15 0211 07/31/15 0237 08/01/15 0233  WBC 12.4*  --  14.9* 12.5* 11.4*  NEUTROABS 11.0*  --   --   --   --   HGB 14.2 15.6* 14.7 13.9 14.1  HCT 42.5 46.0 43.5 43.4 43.6  MCV 87.1  --  87.2 89.7 88.8  PLT 161  --  141* 201 206   No results for input(s): CKTOTAL, CKMB, CKMBINDEX, TROPONINI in the last 168 hours. No results for input(s): LABPROT, INR in the last 72 hours. No results for input(s): COLORURINE, LABSPEC, [place], GLUCOSEU, HGBUR, BILIRUBINUR, KETONESUR, PROTEINUR, UROBILINOGEN, NITRITE, LEUKOCYTESUR in the last 72 hours.  Invalid input(s): APPERANCEUR     Component Value Date/Time   CHOL 237* 07/30/2015 0211   TRIG 405* 07/30/2015 0211   HDL 47 07/30/2015 0211   CHOLHDL 5.0 07/30/2015 0211   VLDL UNABLE TO CALCULATE IF  TRIGLYCERIDE OVER 400 mg/dL 07/30/2015 0211   LDLCALC UNABLE TO CALCULATE IF TRIGLYCERIDE OVER 400 mg/dL 07/30/2015 0211   Lab Results  Component Value Date   HGBA1C 6.7* 07/30/2015      Component Value Date/Time   LABOPIA NONE DETECTED 07/29/2015 0930   COCAINSCRNUR NONE DETECTED 07/29/2015 0930   LABBENZ POSITIVE* 07/29/2015 0930   AMPHETMU NONE DETECTED 07/29/2015 0930   THCU NONE DETECTED 07/29/2015 0930   LABBARB NONE DETECTED 07/29/2015 0930     Recent Labs Lab 07/29/15 0659  ETH <5    I have personally reviewed the radiological images below and agree with the radiology interpretations.  Ct Angio Head and neck W/cm &/or Wo Cm Ct Head Wo Contrast  07/31/2015  IMPRESSION: 1. No significant interval change in the size of a right thalamic hemorrhage with extension into the brainstem. 2. Surrounding edema and midline shift is stable. 3. Slight increase in interventricular hemorrhage with layering blood products. There is no hydrocephalus. 4. Hypoattenuation in the brainstem is likely artifactual, appearance similar to the prior exam. 5. Stable atrophy and white matter disease otherwise.   07/30/2015  IMPRESSION: CT head: Evolving RIGHT thalamic intraparenchymal hematoma, no suspicious enhancement. CTA neck: No acute vascular or hemodynamically significant stenosis.  Tortuous vessels compatible with chronic hypertension. CTA head:  No acute large vessel occlusion Moderate to high-grade stenosis RIGHT P2 segment (likely thromboembolic, less likely focal vasospasm). Dolicoectatic intracranial vessels compatible with chronic hypertension.   Ct Head Wo Contrast  07/29/2015  IMPRESSION: 1. Right thalamic hemorrhage with 6 mm of midline shift from right to left. 2. Intraventricular hemorrhage. 3. Old left basal ganglia lacunar infarct. 4. 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    Ct Cervical Spine Wo Contrast  07/29/2015  IMPRESSION: 1. No maxillofacial  fractures. 2. No cervical spine fracture or subluxation. 3. Cervical spine degenerative changes. 4. Mild bilateral carotid artery atheromatous calcifications.   Carotid Doppler  Bilateral: 1-39% ICA stenosis. Vertebral artery flow is antegrade.  2D Echocardiogram  - Left ventricle: The cavity size was normal. There was mild concentric hypertrophy. Systolic function was normal. The estimated ejection fraction was in the range of 60% to 65%. Wall motion was normal; there were no regional wall motion abnormalities. There was an increased relative contribution of atrial contraction to ventricular filling. Doppler parameters are consistent with abnormal left ventricular relaxation (grade 1 diastolic dysfunction). - Aortic valve: Mildly to moderately calcified annulus. Trileaflet; mildly thickened, mildly calcified leaflets. There was trivial regurgitation. - Mitral valve: Calcified annulus. Minimal diffuse calcification of the anterior leaflet and posterior leaflet. - Atrial septum: There was increased thickness of the septum, consistent with lipomatous hypertrophy. - Pulmonary arteries: PA peak pressure: 32 mm Hg (S).   PHYSICAL EXAM  Temp:  [97.4 F (36.3 C)-100.7 F (38.2 C)] 98.1 F (36.7 C) (10/27 1121) Pulse Rate:  [84-120] 95 (10/27 1400) Resp:  [5-26] 18 (10/27 1400) BP: (118-166)/(60-126) 154/74 mmHg (10/27 1400) SpO2:  [87 %-100 %] 96 % (10/27 1400)  General - Well nourished, well developed, lethargic.  Ophthalmologic - fundi not visualized due to noncooperation.  Cardiovascular - Regular rate and rhythm.  Mental Status -  Level of arousal and orientation to time, place, and person were intact. Language including expression, naming, repetition, comprehension was assessed and found intact, but dysarthria and hypophonia. Left side neglect.   Cranial Nerves II - XII - II - not blinking to visual threat on the left. III, IV, VI - Extraocular  movements intact, able to have left gaze, but right eye gaze preference. V - Facial sensation intact bilaterally. VII - left facial droop. VIII - Hearing & vestibular intact bilaterally. X - Palate elevates symmetrically, dysarthria. XI - shoulder shrug decreased on the left. XII - Tongue protrusion intact.  Motor Strength - The patient's strength was 0/5 LUE and LLE, 5/5 RUE and RLE. Bulk was normal and fasciculations were absent.   Motor Tone - Muscle tone was assessed at the neck and appendages and there is significant increased muscle tone on the left UE and LE.  Reflexes - The patient's reflexes were symmetrical in all extremities and she had no pathological reflexes.  Sensory - Light touch, temperature/pinprick were assessed and were symmetrical.    Coordination - The patient had normal movements in the right hand with no ataxia or dysmetria.  Tremor was absent.  Gait and Station - not tested due to weakness.   ASSESSMENT/PLAN Destiny Robinson is a 59 y.o. female with history of HTN, HLD, DM, diabetic gastroparesis, GIB, obesity, OSA on CPAP admitted for acute onset left weakness causing fall. Symptoms unchanged.    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  CT repeat showed stable ICH and no hydrocephalus  CTA head and neck - right P2 stenosis but otherwise unremarkable  Carotid Doppler  unremarkable  2D Echo  unremarkable  LDL unable to calculate due to TG 405  HgbA1c 6.7  Heparin subq for VTE prophylaxis  Diet clear liquid Room service appropriate?: Yes; Fluid consistency:: Thin   clopidogrel 75 mg daily prior to admission, now on No antithrombotic due to [place]  Patient counseled to be compliant with Destiny Robinson antithrombotic medications  Ongoing aggressive stroke risk factor management  Therapy recommendations:  CIR  Disposition:   Pending  Cerebral edema  Midline shift  Na 142->141->139  No neuro change  Continue to monitoring  May consider 3% saline if neuro changes.  Diabetes  HgbA1c pending goal < 7.0  Uncontrolled  CBG monitoring showed hyperglycemia  SSI  DM education  increase levemir to 35 units daily   Hypertension  Home meds:   Amlodipine, HCTZ and metoprolol  BP goal < 160  Put back on amlodipine and metoprolol  Add lisinopril for better BP control  Wean off cleviprex as able  Patient counseled to be compliant with Destiny Robinson blood pressure medications  Hyperlipidemia  Home meds:  pravastatin   Currently on pravastatin and zetia  LDL not able to calculate, goal < 70  Add zetia due to high TG  Continue pravastatin and zetia at discharge  N/V  Hx of diabetic gastropresis  QTc 530->449  Stop compazine, add Zofran  Other Stroke Risk Factors  obesity  Migraines - on nortriptyline at home, doing well  Complicated migraine - left sided numbness with HA, much better, less frequent, on nortriptyline at home  Obstructive sleep apnea, on CPAP at home  Other Active Problems  Hypokalemia - supplement  Mild leukocystosis  Other Pertinent History  GIB history  Hospital day # 3  This patient is critically ill due to [place] with IVH, uncontrolled HTN and at significant risk of neurological worsening, death form hematoma enlargement, obstructive hydrocephalus, hypertensive encephalopathy, brain herniation. This patient's care requires constant monitoring of vital signs, hemodynamics, respiratory and cardiac monitoring, review of multiple databases, neurological assessment, discussion with family, other specialists and medical decision making of high complexity. I spent 35 minutes of neurocritical care time in the care of this patient.  Rosalin Hawking, MD PhD Stroke Neurology 08/01/2015 2:44 PM    To contact Stroke Continuity provider, please refer to [email or web address] After  hours, contact General Neurology

## 2015-08-01 NOTE — Progress Notes (Signed)
Inpatient Diabetes Program Recommendations  AACE/ADA: New Consensus Statement on Inpatient Glycemic Control (2015)  Target Ranges:  Prepandial:   less than 140 mg/dL      Peak postprandial:   less than 180 mg/dL (1-2 hours)      Critically ill patients:  140 - 180 mg/dL   Review of Glycemic Control  Results for [name], [name] (MRN [id number]) as of 08/01/2015 09:10  Ref. Range 07/31/2015 14:48 07/31/2015 18:45 07/31/2015 20:13 07/31/2015 22:34 08/01/2015 07:37  Glucose-Capillary Latest Ref Range: 65-99 mg/dL 191 (H) 205 (H) 183 (H) 193 (H) 167 (H)   Diabetes history: Type 2 Outpatient Diabetes medications: NPH 44 units qday, Glipizide 5mg  bid, Metformin 500mg  bid Current orders for Inpatient glycemic control: Lantus 26 units qday, Novolog moderate correction 0-15 units tid  Inpatient Diabetes Program Recommendations: Fasting blood sugar much improved this morning since the increase in basal insulin.  Will continue to follow.   Gentry Fitz, RN, BA, MHA, CDE Diabetes Coordinator Inpatient Diabetes Program  [phone] (Team Pager) [phone] ([place]) 08/01/2015 9:16 AM

## 2015-08-01 NOTE — Progress Notes (Signed)
Physical Therapy Treatment Patient Details Name: Destiny Robinson MRN: [id number] DOB: 03/18/1956 Today's Date: 08/01/2015    History of Present Illness Destiny Robinson is an 59 y.o. female with a history of multiple medical problems who reports awakening this morning and attempting to get out of bed. Patient fell and hit the nightstand. Was unable get off the floor. EMS was called and patient was brought in as a code stroke. Initial NIHSS     PT Comments    Pt admitted with above diagnosis. Pt currently with functional limitations due to balance and endurance deficits). Pt able to sit EOB x 8 minutes with varying total to max assist.  Pt with poor sense of midline.  Slow progress but was following come commands with delay.  Bathed pt and changed all linens as she was wet with urine soaked bed.   Pt will benefit from skilled PT to increase their independence and safety with mobility to allow discharge to the venue listed below.    Follow Up Recommendations  CIR;Supervision/Assistance - 24 hour     Equipment Recommendations  None recommended by PT    Recommendations for Other Services Rehab consult     Precautions / Restrictions Precautions Precautions: Fall Restrictions Weight Bearing Restrictions: No    Mobility  Bed Mobility Overal bed mobility: Needs Assistance;+2 for physical assistance Bed Mobility: Supine to Sit     Supine to sit: Max assist;+2 for physical assistance     General bed mobility comments: pt did initiate with R UE and R LE  Transfers Overall transfer level: Needs assistance Equipment used: 2 person hand held assist Transfers: Sit to/from Stand Sit to Stand: Max assist;+2 physical assistance         General transfer comment: used gait belt with bed pad. pt strong push to L, pt attempted to step with R LE however pt unable to move L LEAble to stand with assist long enough to change bed linens as pt soaked with urine.    Ambulation/Gait                  Stairs            Wheelchair Mobility    Modified Rankin (Stroke Patients Only) Modified Rankin (Stroke Patients Only) Pre-Morbid Rankin Score: No symptoms Modified Rankin: Severe disability     Balance Overall balance assessment: Needs assistance Sitting-balance support: Single extremity supported;Feet supported Sitting balance-Leahy Scale: Poor Sitting balance - Comments: mod assist, strong push toward L with R UE.  Attempted to achieve midline.  Pt does not track much past midline to left and with delays noted.  Pt was able to lean on right elbow and then push up from right but still leaning too far left as her tone kicks in.   Postural control: Left lateral lean                          Cognition Arousal/Alertness: Awake/alert Behavior During Therapy: Flat affect Overall Cognitive Status: Impaired/Different from baseline Area of Impairment: Awareness;Problem solving;Safety/judgement         Safety/Judgement: Decreased awareness of safety;Decreased awareness of deficits Awareness: Intellectual Problem Solving: Slow processing;Decreased initiation;Difficulty sequencing;Requires verbal cues;Requires tactile cues General Comments: pt able to answer questions appropriately, but slowly    Exercises      General Comments        Pertinent Vitals/Pain Pain Assessment: No/denies pain  VSS    Home Living  Prior Function            PT Goals (current goals can now be found in the care plan section) Progress towards PT goals: Progressing toward goals    Frequency  Min 4X/week    PT Plan Current plan remains appropriate    Co-evaluation             End of Session Equipment Utilized During Treatment: Gait belt Activity Tolerance: Patient limited by fatigue Patient left: in bed;with call bell/phone within reach;with bed alarm set     Time: 7209-4709 PT Time Calculation (min) (ACUTE ONLY): 24  min  Charges:  $Therapeutic Activity: 23-37 mins                    G CodesDenice Paradise 08-19-15, 2:40 PM  [facility] Acute [facility] [phone] (pager)

## 2015-08-01 NOTE — Care Management Important Message (Signed)
Important Message  Patient Details  Name: Destiny Robinson MRN: [id number] Date of Birth: 1956/08/26   Medicare Important Message Given:  Yes-second notification given    Nathen May 08/01/2015, 10:36 AM

## 2015-08-01 NOTE — Progress Notes (Signed)
Rehab admissions - Evaluated for possible admission.  Please see rehab consult done by Dr. Posey Pronto.  I will contact family to discuss rehab and determine most appropriate rehab venue of care.  Call me for questions.  #[phone]

## 2015-08-02 ENCOUNTER — Inpatient Hospital Stay (HOSPITAL_COMMUNITY): Payer: Medicare Other

## 2015-08-02 LAB — GLUCOSE, CAPILLARY
GLUCOSE-CAPILLARY: 185 mg/dL — AB (ref 65–99)
GLUCOSE-CAPILLARY: 149 mg/dL — AB (ref 65–99)
Glucose-Capillary: 172 mg/dL — ABNORMAL HIGH (ref 65–99)
Glucose-Capillary: 141 mg/dL — ABNORMAL HIGH (ref 65–99)

## 2015-08-02 MED ORDER — INSULIN DETEMIR 100 UNIT/ML ~~LOC~~ SOLN
40.0000 [IU] | Freq: Every day | SUBCUTANEOUS | Status: DC
  Administered 2015-08-02 – 2015-08-07 (×6): 40 [IU] via SUBCUTANEOUS
  Filled 2015-08-02 (×6): qty 0.4

## 2015-08-02 MED ORDER — HYDRALAZINE HCL 20 MG/ML IJ SOLN
10.0000 mg | INTRAMUSCULAR | Status: DC | PRN
  Administered 2015-08-02: 10 mg via INTRAVENOUS
  Filled 2015-08-02: qty 1

## 2015-08-02 MED ORDER — HYDRALAZINE HCL 50 MG PO TABS
50.0000 mg | ORAL_TABLET | Freq: Three times a day (TID) | ORAL | Status: DC
  Administered 2015-08-02 – 2015-08-07 (×16): 50 mg via ORAL
  Filled 2015-08-02 (×18): qty 1

## 2015-08-02 NOTE — Progress Notes (Signed)
STROKE TEAM PROGRESS NOTE   SUBJECTIVE (INTERVAL HISTORY) Her daughter is at the bedside.  Overall her condition is stable. Still on cleviprex and trying to wean off. Increased BP meds. Left side stiffness improving.     OBJECTIVE Temp:  [98.1 F (36.7 C)-100.2 F (37.9 C)] 99.4 F (37.4 C) (10/28 0741) Pulse Rate:  [84-120] 105 (10/28 1000) Cardiac Rhythm:  [-] Normal sinus rhythm (10/28 0800) Resp:  [11-25] 15 (10/28 1000) BP: (135-168)/(63-97) 159/80 mmHg (10/28 1000) SpO2:  [94 %-100 %] 100 % (10/28 1000)   Recent Labs Lab 08/01/15 0737 08/01/15 1120 08/01/15 1531 08/01/15 2145 08/02/15 0740  GLUCAP 167* 175* 161* 165* 185*    Recent Labs Lab 07/29/15 0659 07/29/15 0718 07/30/15 0211 07/31/15 0237 08/01/15 0233  NA 141 139 142 141 139  K 3.9 3.8 3.3* 3.6 3.3*  CL 101 99* 98* 101 104  CO2 22  --  27 24 24   GLUCOSE 217* 228* 225* 224* 219*  BUN 11 15 13 9 13   CREATININE 0.56 0.40* 0.61 0.48 0.53  CALCIUM 9.6  --  9.7 9.4 8.9    Recent Labs Lab 07/29/15 0659  AST 43*  ALT 27  ALKPHOS 41  BILITOT 0.7  PROT 7.8  ALBUMIN 4.3    Recent Labs Lab 07/29/15 0659 07/29/15 0718 07/30/15 0211 07/31/15 0237 08/01/15 0233  WBC 12.4*  --  14.9* 12.5* 11.4*  NEUTROABS 11.0*  --   --   --   --   HGB 14.2 15.6* 14.7 13.9 14.1  HCT 42.5 46.0 43.5 43.4 43.6  MCV 87.1  --  87.2 89.7 88.8  PLT 161  --  141* 201 206   No results for input(s): CKTOTAL, CKMB, CKMBINDEX, TROPONINI in the last 168 hours. No results for input(s): LABPROT, INR in the last 72 hours. No results for input(s): COLORURINE, LABSPEC, [place], GLUCOSEU, HGBUR, BILIRUBINUR, KETONESUR, PROTEINUR, UROBILINOGEN, NITRITE, LEUKOCYTESUR in the last 72 hours.  Invalid input(s): APPERANCEUR     Component Value Date/Time   CHOL 237* 07/30/2015 0211   TRIG 405* 07/30/2015 0211   HDL 47 07/30/2015 0211   CHOLHDL 5.0 07/30/2015 0211   VLDL UNABLE TO CALCULATE IF TRIGLYCERIDE OVER 400 mg/dL 07/30/2015  0211   LDLCALC UNABLE TO CALCULATE IF TRIGLYCERIDE OVER 400 mg/dL 07/30/2015 0211   Lab Results  Component Value Date   HGBA1C 6.7* 07/30/2015      Component Value Date/Time   LABOPIA NONE DETECTED 07/29/2015 0930   COCAINSCRNUR NONE DETECTED 07/29/2015 0930   LABBENZ POSITIVE* 07/29/2015 0930   AMPHETMU NONE DETECTED 07/29/2015 0930   THCU NONE DETECTED 07/29/2015 0930   LABBARB NONE DETECTED 07/29/2015 0930     Recent Labs Lab 07/29/15 0659  ETH <5    I have personally reviewed the radiological images below and agree with the radiology interpretations.  Ct Angio Head and neck W/cm &/or Wo Cm Ct Head Wo Contrast  07/31/2015  IMPRESSION: 1. No significant interval change in the size of a right thalamic hemorrhage with extension into the brainstem. 2. Surrounding edema and midline shift is stable. 3. Slight increase in interventricular hemorrhage with layering blood products. There is no hydrocephalus. 4. Hypoattenuation in the brainstem is likely artifactual, appearance similar to the prior exam. 5. Stable atrophy and white matter disease otherwise.   07/30/2015  IMPRESSION: CT head: Evolving RIGHT thalamic intraparenchymal hematoma, no suspicious enhancement. CTA neck: No acute vascular or hemodynamically significant stenosis. Tortuous vessels compatible with chronic hypertension. CTA head:  No acute large vessel occlusion Moderate to high-grade stenosis RIGHT P2 segment (likely thromboembolic, less likely focal vasospasm). Dolicoectatic intracranial vessels compatible with chronic hypertension.   Ct Head Wo Contrast  07/29/2015  IMPRESSION: 1. Right thalamic hemorrhage with 6 mm of midline shift from right to left. 2. Intraventricular hemorrhage. 3. Old left basal ganglia lacunar infarct. 4. 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    Ct Cervical Spine Wo Contrast  07/29/2015  IMPRESSION: 1. No maxillofacial fractures. 2. No cervical spine fracture or  subluxation. 3. Cervical spine degenerative changes. 4. Mild bilateral carotid artery atheromatous calcifications.   Carotid Doppler  Bilateral: 1-39% ICA stenosis. Vertebral artery flow is antegrade.  2D Echocardiogram  - Left ventricle: The cavity size was normal. There was mild concentric hypertrophy. Systolic function was normal. The estimated ejection fraction was in the range of 60% to 65%. Wall motion was normal; there were no regional wall motion abnormalities. There was an increased relative contribution of atrial contraction to ventricular filling. Doppler parameters are consistent with abnormal left ventricular relaxation (grade 1 diastolic dysfunction). - Aortic valve: Mildly to moderately calcified annulus. Trileaflet; mildly thickened, mildly calcified leaflets. There was trivial regurgitation. - Mitral valve: Calcified annulus. Minimal diffuse calcification of the anterior leaflet and posterior leaflet. - Atrial septum: There was increased thickness of the septum, consistent with lipomatous hypertrophy. - Pulmonary arteries: PA peak pressure: 32 mm Hg (S).   PHYSICAL EXAM  Temp:  [98.1 F (36.7 C)-100.2 F (37.9 C)] 99.4 F (37.4 C) (10/28 0741) Pulse Rate:  [84-120] 105 (10/28 1000) Resp:  [11-25] 15 (10/28 1000) BP: (135-168)/(63-97) 159/80 mmHg (10/28 1000) SpO2:  [94 %-100 %] 100 % (10/28 1000)  General - Well nourished, well developed, mildly lethargic.  Ophthalmologic - fundi not visualized due to noncooperation.  Cardiovascular - Regular rate and rhythm.  Mental Status -  Level of arousal and orientation to time, place, and person were intact. Language including expression, naming, repetition, comprehension was assessed and found intact, but dysarthria and hypophonia.   Cranial Nerves II - XII - II - not blinking to visual threat on the left. III, IV, VI - Extraocular movements intact, able to have left gaze, but right eye gaze  preference. V - Facial sensation intact bilaterally. VII - left facial droop. VIII - Hearing & vestibular intact bilaterally. X - Palate elevates symmetrically, dysarthria. XI - shoulder shrug decreased on the left. XII - Tongue protrusion intact.  Motor Strength - The patient's strength was 0/5 LUE and LLE, 5/5 RUE and RLE. Bulk was normal and fasciculations were absent.   Motor Tone - Muscle tone was assessed at the neck and appendages and there is significant increased muscle tone on the left UE and LE.  Reflexes - The patient's reflexes were symmetrical in all extremities and she had no pathological reflexes.  Sensory - Light touch, temperature/pinprick were assessed and were symmetrical.    Coordination - The patient had normal movements in the right hand with no ataxia or dysmetria.  Tremor was absent.  Gait and Station - not tested due to weakness.   ASSESSMENT/PLAN Destiny Robinson is a 59 y.o. female with history of HTN, HLD, DM, diabetic gastroparesis, GIB, obesity, OSA on CPAP admitted for acute onset left weakness causing fall. Symptoms unchanged.    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  CT repeat showed  stable ICH and no hydrocephalus  CTA head and neck - right P2 stenosis but otherwise unremarkable  Carotid Doppler  unremarkable  2D Echo  unremarkable  LDL unable to calculate due to TG 405  HgbA1c 6.7  Heparin subq for VTE prophylaxis  Diet clear liquid Room service appropriate?: Yes; Fluid consistency:: Thin   clopidogrel 75 mg daily prior to admission, now on No antithrombotic due to [place]  Patient counseled to be compliant with her antithrombotic medications  Ongoing aggressive stroke risk factor management  Therapy recommendations:  CIR  Disposition:  Pending  Cerebral edema  Midline shift  Na 142->141->139  No neuro  change  Continue to monitoring  Diabetes  HgbA1c pending goal < 7.0  Uncontrolled  CBG monitoring showed improving hyperglycemia  SSI  DM education  increase levemir to 40 units today   Hypertension  Home meds:   Amlodipine, HCTZ and metoprolol  BP goal < 180   Put back on amlodipine and metoprolol  continue lisinopril for better BP control  Add hydralazine for better BP control  Wean off cleviprex as able  Decrease IVF NS as pt able to eat  Patient counseled to be compliant with her blood pressure medications  Hyperlipidemia  Home meds:  pravastatin   Currently on pravastatin and zetia  LDL not able to calculate, goal < 70  Add zetia due to high TG  Continue pravastatin and zetia at discharge  N/V  Hx of diabetic gastropresis  QTc 530->449  Stop compazine, add Zofran  Other Stroke Risk Factors  obesity  Migraines - on nortriptyline at home, doing well  Complicated migraine - left sided numbness with HA, much better, less frequent, on nortriptyline at home  Obstructive sleep apnea, on CPAP at home  Other Active Problems  Hypokalemia - supplement  Mild leukocystosis  Other Pertinent History  GIB history  Hospital day # 4  This patient is critically ill due to [place] with IVH, uncontrolled HTN and at significant risk of neurological worsening, death form hematoma enlargement, obstructive hydrocephalus, hypertensive encephalopathy, brain herniation. This patient's care requires constant monitoring of vital signs, hemodynamics, respiratory and cardiac monitoring, review of multiple databases, neurological assessment, discussion with family, other specialists and medical decision making of high complexity. I spent 35 minutes of neurocritical care time in the care of this patient.  Rosalin Hawking, MD PhD Stroke Neurology 08/02/2015 10:14 AM    To contact Stroke Continuity provider, please refer to [email or web address] After hours, contact General  Neurology

## 2015-08-02 NOTE — Care Management Important Message (Signed)
Important Message  Patient Details  Name: Destiny Robinson MRN: [id number] Date of Birth: [birth year]   Medicare Important Message Given:  Yes-third notification given    Nathen May 08/02/2015, 11:48 AM

## 2015-08-02 NOTE — Progress Notes (Signed)
Rehab admissions - I called patient's sister.  Sister plans for patient to go home with her after rehab.  She has help from her brother and patient has a daughter and a granddaughter.  I will follow progress over the weekend and check back Monday to see if patient is progressing and possibly ready for rehab.  We will need authorization from [place] once patient is medically ready.  Call me for questions.  #[phone]

## 2015-08-02 NOTE — Progress Notes (Signed)
Occupational Therapy Treatment Patient Details Name: Destiny Robinson MRN: [id number] DOB: 1956/06/06 Today's Date: 08/02/2015    History of present illness Destiny Robinson is an 59 y.o. female with a history of multiple medical problems who reports awakening this morning and attempting to get out of bed. Patient fell and hit the nightstand. Was unable get off the floor. EMS was called and patient was brought in as a code stroke.  NIHSS  10/28 18.   OT comments   Pt demonstrating improvement. Family present for session. Educated on technique for reducing tone LUE and PROM. No active movement LUE/increased extensor tone. R gaze preference but will sustain gaze in L field @ 3 seconds. Continue to recommend CIR for rehab. Will follow to address established goals.    Follow Up Recommendations  CIR    Equipment Recommendations  3 in 1 bedside comode;Tub/shower bench    Recommendations for Other Services      Precautions / Restrictions Precautions Precautions: Fall Restrictions Weight Bearing Restrictions: No       Mobility Bed Mobility               General bed mobility comments: Pt pulling with R hand on rail to attempt to help with rolling.  Transfers                      Balance                                   ADL Overall ADL's : Needs assistance/impaired Eating/Feeding: Moderate assistance;Sitting Eating/Feeding Details (indicate cue type and reason): pt able to bring cup to mouth. cues to correctly position straw in mouth and cuing for small sips; family present Grooming: Wash/dry face;Bed level Grooming Details (indicate cue type and reason): set up/supervision                               General ADL Comments: Completed ADL @ bed level. Pt following commands with delay. Cues to locate LUE. Family set up to wash hair. Pt combing her hair. - reaching to L with mod vc to locate comb. Does not visually attend to task       Vision                 Additional Comments: restricted gaze on L. Will visually track into left field but unable to maintain gaze more than 3 seconds. Educated family on need to stand on L side adn increae attention and scanning to L   Perception     Praxis      Cognition   Behavior During Therapy: Flat affect Overall Cognitive Status: Impaired/Different from baseline Area of Impairment: Attention;Following commands;Safety/judgement;Awareness;Problem solving   Current Attention Level: Sustained    Following Commands: Follows one step commands with increased time Safety/Judgement: Decreased awareness of safety;Decreased awareness of deficits Awareness: Intellectual Problem Solving: Slow processing;Decreased initiation;Difficulty sequencing;Requires verbal cues;Requires tactile cues General Comments: delay in responses; mod cues to look L. difficulty sustaning visaul attention in L field.    Extremity/Trunk Assessment               Exercises Other Exercises Other Exercises: inhibition of LUE tone by applying pressure to vertebral border of scapula and protracting scapula - educated daughter on technique. Daughter able to return demonstrate. Other Exercises: LUE PROM. no active movement  observed Other Exercises: educated family on importance of keeping LUE supported   Shoulder Instructions       General Comments      Pertinent Vitals/ Pain       Pain Assessment: No/denies pain  Home Living                                          Prior Functioning/Environment              Frequency Min 3X/week     Progress Toward Goals  OT Goals(current goals can now be found in the care plan section)  Progress towards OT goals: Progressing toward goals  Acute Rehab OT Goals OT Goal Formulation: With patient/family Time For Goal Achievement: 08/14/15 Potential to Achieve Goals: Fair ADL Goals Pt Will Perform Eating: with min  assist;sitting Pt Will Perform Grooming: with min assist;sitting Pt/caregiver will Perform Home Exercise Program: Right Upper extremity;Independently Additional ADL Goal #1: Pt will locate food or ADL items L of midline with min verbal cues and head turn Additional ADL Goal #2: Pt will perform bed mobility with moderate assist to help with positioning and care. Additional ADL Goal #3: Pt will sit EOB with min guard assistance x 3 minutes.  Plan Discharge plan remains appropriate    Co-evaluation                 End of Session     Activity Tolerance Patient tolerated treatment well   Patient Left in bed;with call bell/phone within reach;with family/visitor present;with SCD's reapplied   Nurse Communication Mobility status        Time: 5747-3403 OT Time Calculation (min): 31 min  Charges: OT General Charges $OT Visit: 1 Procedure OT Treatments $Self Care/Home Management : 8-22 mins $Therapeutic Activity: 8-22 mins  Destiny Robinson,Destiny Robinson 08/02/2015, 11:54 AM   Maurie Boettcher, OTR/L  [phone] 08/02/2015

## 2015-08-03 ENCOUNTER — Inpatient Hospital Stay (HOSPITAL_COMMUNITY): Payer: Medicare Other

## 2015-08-03 LAB — CBC
HCT: 44 % (ref 36.0–46.0)
HEMOGLOBIN: 14.5 g/dL (ref 12.0–15.0)
MCH: 28.8 pg (ref 26.0–34.0)
MCHC: 33 g/dL (ref 30.0–36.0)
MCV: 87.3 fL (ref 78.0–100.0)
Platelets: 194 10*3/uL (ref 150–400)
RBC: 5.04 MIL/uL (ref 3.87–5.11)
RDW: 13.9 % (ref 11.5–15.5)
WBC: 9.8 10*3/uL (ref 4.0–10.5)

## 2015-08-03 LAB — BASIC METABOLIC PANEL
ANION GAP: 12 (ref 5–15)
BUN: 9 mg/dL (ref 6–20)
CHLORIDE: 107 mmol/L (ref 101–111)
CO2: 25 mmol/L (ref 22–32)
Calcium: 8.9 mg/dL (ref 8.9–10.3)
Creatinine, Ser: 0.54 mg/dL (ref 0.44–1.00)
GFR calc non Af Amer: >60 mL/min (ref >60)
Glucose, Bld: 164 mg/dL — ABNORMAL HIGH (ref 65–99)
POTASSIUM: 2.7 mmol/L — AB (ref 3.5–5.1)
SODIUM: 144 mmol/L (ref 135–145)

## 2015-08-03 LAB — GLUCOSE, CAPILLARY
GLUCOSE-CAPILLARY: 166 mg/dL — AB (ref 65–99)
GLUCOSE-CAPILLARY: 121 mg/dL — AB (ref 65–99)
GLUCOSE-CAPILLARY: 156 mg/dL — AB (ref 65–99)
GLUCOSE-CAPILLARY: 147 mg/dL — AB (ref 65–99)

## 2015-08-03 MED ORDER — POTASSIUM CHLORIDE 10 MEQ/100ML IV SOLN
10.0000 meq | INTRAVENOUS | Status: AC
  Administered 2015-08-03 (×2): 10 meq via INTRAVENOUS
  Filled 2015-08-03 (×2): qty 100

## 2015-08-03 MED ORDER — POTASSIUM CHLORIDE 20 MEQ/15ML (10%) PO SOLN
20.0000 meq | Freq: Once | ORAL | Status: AC
  Administered 2015-08-03: 20 meq via ORAL
  Filled 2015-08-03: qty 15

## 2015-08-03 MED ORDER — PROMETHAZINE HCL 25 MG PO TABS
25.0000 mg | ORAL_TABLET | Freq: Four times a day (QID) | ORAL | Status: DC | PRN
  Administered 2015-08-03: 25 mg via ORAL
  Filled 2015-08-03 (×2): qty 1

## 2015-08-03 MED ORDER — POTASSIUM CHLORIDE CRYS ER 20 MEQ PO TBCR
20.0000 meq | EXTENDED_RELEASE_TABLET | Freq: Once | ORAL | Status: AC
  Administered 2015-08-03: 20 meq via ORAL
  Filled 2015-08-03: qty 1

## 2015-08-03 NOTE — Progress Notes (Addendum)
STROKE TEAM PROGRESS NOTE  HISTORY Destiny Robinson is an 59 y.o. female with a history of multiple medical problems who reported awakening the morning of admission and attempting to get out of bed. Patient fell and hit the nightstand. Was unable get off the floor. EMS was called and patient was brought in as a code stroke. Initial NIHSS of 19.   Date last known well: Date: 07/28/2015 Time last known well: Time: 22:00 tPA Given: No: ICH   SUBJECTIVE (INTERVAL HISTORY) Overnight no issues; Now off of Cleviprex   OBJECTIVE Temp:  [98.2 F (36.8 C)-99.8 F (37.7 C)] 99.4 F (37.4 C) (10/29 0819) Pulse Rate:  [66-116] 92 (10/29 0800) Cardiac Rhythm:  [-] Normal sinus rhythm (10/29 0400) Resp:  [11-25] 14 (10/29 0800) BP: (133-189)/(65-103) 174/90 mmHg (10/29 0800) SpO2:  [91 %-100 %] 95 % (10/29 0800)   Recent Labs Lab 08/02/15 0740 08/02/15 1113 08/02/15 1659 08/02/15 2157 08/03/15 0818  GLUCAP 185* 172* 141* 149* 166*    Recent Labs Lab 07/29/15 9201 07/29/15 0071 07/30/15 0211 07/31/15 0237 08/01/15 0233 08/03/15 0319  NA 141 139 142 141 139 144  K 3.9 3.8 3.3* 3.6 3.3* 2.7*  CL 101 99* 98* 101 104 107  CO2 22  --  27 24 24 25   GLUCOSE 217* 228* 225* 224* 219* 164*  BUN 11 15 13 9 13 9   CREATININE 0.56 0.40* 0.61 0.48 0.53 0.54  CALCIUM 9.6  --  9.7 9.4 8.9 8.9    Recent Labs Lab 07/29/15 0659  AST 43*  ALT 27  ALKPHOS 41  BILITOT 0.7  PROT 7.8  ALBUMIN 4.3    Recent Labs Lab 07/29/15 0659 07/29/15 0718 07/30/15 0211 07/31/15 0237 08/01/15 0233 08/03/15 0319  WBC 12.4*  --  14.9* 12.5* 11.4* 9.8  NEUTROABS 11.0*  --   --   --   --   --   HGB 14.2 15.6* 14.7 13.9 14.1 14.5  HCT 42.5 46.0 43.5 43.4 43.6 44.0  MCV 87.1  --  87.2 89.7 88.8 87.3  PLT 161  --  141* 201 206 194   No results for input(s): CKTOTAL, CKMB, CKMBINDEX, TROPONINI in the last 168 hours. No results for input(s): LABPROT, INR in the last 72 hours. No results for  input(s): COLORURINE, LABSPEC, [place], GLUCOSEU, HGBUR, BILIRUBINUR, KETONESUR, PROTEINUR, UROBILINOGEN, NITRITE, LEUKOCYTESUR in the last 72 hours.  Invalid input(s): APPERANCEUR     Component Value Date/Time   CHOL 237* 07/30/2015 0211   TRIG 405* 07/30/2015 0211   HDL 47 07/30/2015 0211   CHOLHDL 5.0 07/30/2015 0211   VLDL UNABLE TO CALCULATE IF TRIGLYCERIDE OVER 400 mg/dL 07/30/2015 0211   LDLCALC UNABLE TO CALCULATE IF TRIGLYCERIDE OVER 400 mg/dL 07/30/2015 0211   Lab Results  Component Value Date   HGBA1C 6.7* 07/30/2015      Component Value Date/Time   LABOPIA NONE DETECTED 07/29/2015 0930   COCAINSCRNUR NONE DETECTED 07/29/2015 0930   LABBENZ POSITIVE* 07/29/2015 0930   AMPHETMU NONE DETECTED 07/29/2015 0930   THCU NONE DETECTED 07/29/2015 0930   LABBARB NONE DETECTED 07/29/2015 0930     Recent Labs Lab 07/29/15 0659  ETH <5    I have personally reviewed the radiological images below and agree with the radiology interpretations.  Ct Angio Head and neck W/cm &/or Wo Cm Ct Head Wo Contrast 07/31/2015   IMPRESSION: 1. No significant interval change in the size of a right thalamic hemorrhage with extension into the brainstem. 2.  Surrounding edema and midline shift is stable. 3. Slight increase in interventricular hemorrhage with layering blood products. There is no hydrocephalus. 4. Hypoattenuation in the brainstem is likely artifactual, appearance similar to the prior exam. 5. Stable atrophy and white matter disease otherwise.   07/30/2015  IMPRESSION: CT head: Evolving RIGHT thalamic intraparenchymal hematoma, no suspicious enhancement. CTA neck: No acute vascular or hemodynamically significant stenosis. Tortuous vessels compatible with chronic hypertension. CTA head:  No acute large vessel occlusion Moderate to high-grade stenosis RIGHT P2 segment (likely thromboembolic, less likely focal vasospasm). Dolicoectatic intracranial vessels compatible with chronic  hypertension.   Ct Head Wo Contrast 07/29/2015  IMPRESSION: 1. Right thalamic hemorrhage with 6 mm of midline shift from right to left. 2. Intraventricular hemorrhage. 3. Old left basal ganglia lacunar infarct. 4. 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    Ct Cervical Spine Wo Contrast 07/29/2015   IMPRESSION: 1. No maxillofacial fractures. 2. No cervical spine fracture or subluxation. 3. Cervical spine degenerative changes. 4. Mild bilateral carotid artery atheromatous calcifications.   Carotid Doppler  Bilateral: 1-39% ICA stenosis. Vertebral artery flow is antegrade.  2D Echocardiogram  - Left ventricle: The cavity size was normal. There was mild concentric hypertrophy. Systolic function was normal. The estimated ejection fraction was in the range of 60% to 65%. Wall motion was normal; there were no regional wall motion abnormalities. There was an increased relative contribution of atrial contraction to ventricular filling. Doppler parameters are consistent with abnormal left ventricular relaxation (grade 1 diastolic dysfunction). - Aortic valve: Mildly to moderately calcified annulus. Trileaflet; mildly thickened, mildly calcified leaflets. There was trivial regurgitation. - Mitral valve: Calcified annulus. Minimal diffuse calcification of the anterior leaflet and posterior leaflet. - Atrial septum: There was increased thickness of the septum, consistent with lipomatous hypertrophy. - Pulmonary arteries: PA peak pressure: 32 mm Hg (S).   PHYSICAL EXAM  Temp:  [98.2 F (36.8 C)-99.8 F (37.7 C)] 99.4 F (37.4 C) (10/29 0819) Pulse Rate:  [66-116] 92 (10/29 0800) Resp:  [11-25] 14 (10/29 0800) BP: (133-189)/(65-103) 174/90 mmHg (10/29 0800) SpO2:  [91 %-100 %] 95 % (10/29 0800)  General - Well nourished, well developed, lethargic. HEENT:  NCAT; pupils reactive; sclera clear; dry mucous membranes Cardiovascular - Regular rate  and rhythm. Pulmonary:  CTA GI:  Abdomen soft, NT ND Extrem:  No C/C/E  Mental Status -  Level of arousal and orientation to time, place, and person were intact. Language including expression, naming, repetition, comprehension was assessed and found intact, but dysarthria and hypophonia.   Cranial Nerves II - XII - II - not blinking to visual threat on the left. III, IV, VI - Extraocular movements intact, able to have left gaze, but right eye gaze preference. V - Facial sensation intact bilaterally. VII - left facial droop. VIII - Hearing & vestibular intact bilaterally. X - Palate elevates symmetrically, dysarthria. XI - shoulder shrug decreased on the left. XII - Tongue protrusion intact.  Motor Strength - The patient's strength was 0/5 LUE and LLE, 5/5 RUE and RLE. Bulk was normal and fasciculations were absent.   Motor Tone - Muscle tone was assessed at the neck and appendages and there is significant increased muscle tone on the left UE and LE.  Reflexes - The patient's reflexes were symmetrical in all extremities and she had no pathological reflexes.  Sensory - Light touch, temperature/pinprick were assessed; she seems to feel less on the left   Coordination - The patient too lethargic for  exam.  Gait and Station - not tested due to weakness.   ASSESSMENT/PLAN Ms. Destiny Robinson is a 59 y.o. female with history of HTN, HLD, DM, diabetic gastroparesis, GIB, obesity, OSA on CPAP admitted for acute onset left weakness causing fall. Symptoms unchanged.    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  CT repeat showed stable ICH and no hydrocephalus  CTA head and neck - right P2 stenosis but otherwise unremarkable  Carotid Doppler  unremarkable  2D Echo  unremarkable  LDL unable to calculate due to TG 405  HgbA1c 6.7  Heparin subq for VTE  prophylaxis  Diet clear liquid Room service appropriate?: Yes; Fluid consistency:: Thin   clopidogrel 75 mg daily prior to admission, now on No antithrombotic due to [place]  Patient counseled to be compliant with her antithrombotic medications  Ongoing aggressive stroke risk factor management  Therapy recommendations:  CIR  Disposition:  Pending  Cerebral edema  Midline shift  Na 142->141->139  No neuro change  Continue to monitoring  Diabetes  HgbA1c pending goal < 7.0  Uncontrolled  CBG monitoring showed improving hyperglycemia  SSI  DM education  increase levemir to 40 units today   Hypertension  Home meds:   Amlodipine, HCTZ and metoprolol  BP goal < 180   Put back on amlodipine and metoprolol  continue lisinopril for better BP control  Add hydralazine for better BP control  Wean off cleviprex as able  Decrease IVF NS as pt able to eat  Patient counseled to be compliant with her blood pressure medications  Hyperlipidemia  Home meds:  pravastatin   Currently on pravastatin and zetia  LDL not able to calculate, goal < 70  Add zetia due to high TG  Continue pravastatin and zetia at discharge  N/V  Hx of diabetic gastropresis  QTc 530->449  Stop compazine, add Zofran  Other Stroke Risk Factors  obesity  Migraines - on nortriptyline at home, doing well  Complicated migraine - left sided numbness with HA, much better, less frequent, on nortriptyline at home  Obstructive sleep apnea, on CPAP at home  Other Active Problems  Hypokalemia - supplement  Mild leukocystosis  Other Pertinent History  GIB history   Attending System Review and Plan for 08/03/2015  Neuro  CTA Head and Neck 07/31/2015 reviewed  Stable ICH and no hydrocephalus  No antithrombotic currently  Cardiac  Htn hx - SBP goal <180  Cleviprex - weaned to off  Amlodipine 10mg  daily  Metoprolol 50mg  BID  Lisinopril 20mg  BID  If patient continues to  have difficulty with hypertension despite intervention may need to evaluate renal US and 24 hour urine  Pulmonary  Portable chest x-ray today - 1. Low lung volumes. 2. Cardiomegaly without failure. 3. Left basilar airspace densities on the prior exam have cleared.  CXR in AM  GI  Clear liquid diet  GU   I/O 1937.6 / 401 = 1536..6  Endo  DM - hemoglobin A1c 6.7  Inulin adjusted yesterday - CBG 149 today  Heme / ID  CBC - normal - 08/02/2012  T 99.4 axillary  Electrolytes  K - 2.7 today - supplementation ordered  Hospital day # 5   ATTENDING NOTE SUMMARY: This patient is critically ill and at significant risk of neurological worsening, death and care requires constant monitoring of vital signs, hemodynamics,respiratory and cardiac monitoring, extensive review  of multiple databases, frequent neurological assessment, discussion with family, other specialists and medical decision making of high complexity.I have made any additions or clarifications directly to the above note.This critical care time does not reflect procedure time, or teaching time or supervisory time of PA/NP/Med Resident etc but could involve care discussion time. I spent 50 minutes of neurocritical care time in the care of this patient.  SIGNED BY:   Elissa Hefty, MD MHS [facility]    To contact Stroke Continuity provider, please refer to [email or web address] After hours, contact General Neurology

## 2015-08-03 NOTE — Progress Notes (Signed)
CRITICAL VALUE ALERT  Critical value received:  K 2.7  Date of notification:  08/03/2015  Time of notification:  0400  Critical value read back:Yes.    Nurse who received alert:  [place] Diany Formosa, RN   MD notified (1st page):  [place]  Time of first page:  0410  MD notified (2nd page):  Time of second page:  Responding MD:  Armida Sans  Time MD responded:  [phone]

## 2015-08-04 ENCOUNTER — Inpatient Hospital Stay (HOSPITAL_COMMUNITY): Payer: Medicare Other

## 2015-08-04 DIAGNOSIS — R05 Cough: Secondary | ICD-10-CM | POA: Insufficient documentation

## 2015-08-04 DIAGNOSIS — I10 Essential (primary) hypertension: Secondary | ICD-10-CM | POA: Insufficient documentation

## 2015-08-04 DIAGNOSIS — R059 Cough, unspecified: Secondary | ICD-10-CM | POA: Insufficient documentation

## 2015-08-04 LAB — CBC
HEMATOCRIT: 43.1 % (ref 36.0–46.0)
HEMOGLOBIN: 14.1 g/dL (ref 12.0–15.0)
MCH: 28.8 pg (ref 26.0–34.0)
MCHC: 32.7 g/dL (ref 30.0–36.0)
MCV: 88 fL (ref 78.0–100.0)
Platelets: 173 10*3/uL (ref 150–400)
RBC: 4.9 MIL/uL (ref 3.87–5.11)
RDW: 13.7 % (ref 11.5–15.5)
WBC: 9.8 10*3/uL (ref 4.0–10.5)

## 2015-08-04 LAB — BASIC METABOLIC PANEL
Anion gap: 10 (ref 5–15)
BUN: 6 mg/dL (ref 6–20)
CALCIUM: 8.9 mg/dL (ref 8.9–10.3)
CHLORIDE: 106 mmol/L (ref 101–111)
CO2: 25 mmol/L (ref 22–32)
Creatinine, Ser: 0.47 mg/dL (ref 0.44–1.00)
GFR calc Af Amer: >60 mL/min (ref >60)
GFR calc non Af Amer: >60 mL/min (ref >60)
Glucose, Bld: 169 mg/dL — ABNORMAL HIGH (ref 65–99)
Potassium: 3.2 mmol/L — ABNORMAL LOW (ref 3.5–5.1)
Sodium: 141 mmol/L (ref 135–145)

## 2015-08-04 LAB — GLUCOSE, CAPILLARY
GLUCOSE-CAPILLARY: 148 mg/dL — AB (ref 65–99)
GLUCOSE-CAPILLARY: 225 mg/dL — AB (ref 65–99)
GLUCOSE-CAPILLARY: 177 mg/dL — AB (ref 65–99)
Glucose-Capillary: 193 mg/dL — ABNORMAL HIGH (ref 65–99)

## 2015-08-04 LAB — COMPREHENSIVE METABOLIC PANEL
ALBUMIN: 2.9 g/dL — AB (ref 3.5–5.0)
ALK PHOS: 50 U/L (ref 38–126)
ALT: 20 U/L (ref 14–54)
ANION GAP: 11 (ref 5–15)
AST: 24 U/L (ref 15–41)
BUN: 6 mg/dL (ref 6–20)
CALCIUM: 9.1 mg/dL (ref 8.9–10.3)
CO2: 27 mmol/L (ref 22–32)
Chloride: 104 mmol/L (ref 101–111)
Creatinine, Ser: 0.44 mg/dL (ref 0.44–1.00)
GFR calc Af Amer: >60 mL/min (ref >60)
GFR calc non Af Amer: >60 mL/min (ref >60)
GLUCOSE: 140 mg/dL — AB (ref 65–99)
POTASSIUM: 2.7 mmol/L — AB (ref 3.5–5.1)
SODIUM: 142 mmol/L (ref 135–145)
Total Bilirubin: 0.6 mg/dL (ref 0.3–1.2)
Total Protein: 7.1 g/dL (ref 6.5–8.1)

## 2015-08-04 MED ORDER — CHLORHEXIDINE GLUCONATE 0.12 % MT SOLN
15.0000 mL | Freq: Two times a day (BID) | OROMUCOSAL | Status: DC
  Administered 2015-08-04 – 2015-08-07 (×6): 15 mL via OROMUCOSAL
  Filled 2015-08-04 (×7): qty 15

## 2015-08-04 MED ORDER — POTASSIUM CHLORIDE CRYS ER 20 MEQ PO TBCR
40.0000 meq | EXTENDED_RELEASE_TABLET | ORAL | Status: AC
  Administered 2015-08-04 (×2): 40 meq via ORAL
  Filled 2015-08-04 (×2): qty 2

## 2015-08-04 MED ORDER — TRAMADOL HCL 50 MG PO TABS
100.0000 mg | ORAL_TABLET | Freq: Three times a day (TID) | ORAL | Status: DC | PRN
  Administered 2015-08-04 – 2015-08-06 (×2): 100 mg via ORAL
  Filled 2015-08-04 (×2): qty 2

## 2015-08-04 MED ORDER — POTASSIUM CHLORIDE 10 MEQ/100ML IV SOLN
10.0000 meq | Freq: Once | INTRAVENOUS | Status: AC
  Administered 2015-08-04: 10 meq via INTRAVENOUS
  Filled 2015-08-04: qty 100

## 2015-08-04 MED ORDER — POTASSIUM CHLORIDE CRYS ER 10 MEQ PO TBCR
10.0000 meq | EXTENDED_RELEASE_TABLET | Freq: Two times a day (BID) | ORAL | Status: DC
  Administered 2015-08-05 – 2015-08-07 (×5): 10 meq via ORAL
  Filled 2015-08-04 (×6): qty 1

## 2015-08-04 MED ORDER — LABETALOL HCL 100 MG PO TABS
100.0000 mg | ORAL_TABLET | Freq: Three times a day (TID) | ORAL | Status: DC
  Administered 2015-08-04 – 2015-08-07 (×11): 100 mg via ORAL
  Filled 2015-08-04 (×12): qty 1

## 2015-08-04 MED ORDER — POTASSIUM CHLORIDE 10 MEQ/100ML IV SOLN
10.0000 meq | INTRAVENOUS | Status: AC
  Administered 2015-08-04 (×3): 10 meq via INTRAVENOUS
  Filled 2015-08-04 (×4): qty 100

## 2015-08-04 MED ORDER — CETYLPYRIDINIUM CHLORIDE 0.05 % MT LIQD: 7.0000 mL | Freq: Two times a day (BID) | OROMUCOSAL | Status: DC

## 2015-08-04 NOTE — Progress Notes (Signed)
STROKE TEAM PROGRESS NOTE  HISTORY Destiny Robinson is an 59 y.o. female with a history of multiple medical problems who reported awakening the morning of admission and attempting to get out of bed. Patient fell and hit the nightstand. Was unable get off the floor. EMS was called and patient was brought in as a code stroke. Initial NIHSS of 19.   Date last known well: Date: 07/28/2015 Time last known well: Time: 22:00 tPA Given: No: ICH   SUBJECTIVE (INTERVAL HISTORY) No events overnight.  Sister at bedside and updated.  Patient complained of 10/10 headache, but otherwise no complaints.  Patient eating clears for breakfast.   OBJECTIVE Temp:  [98.1 F (36.7 C)-99.7 F (37.6 C)] 98.1 F (36.7 C) (10/30 0759) Pulse Rate:  [65-94] 90 (10/30 0700) Cardiac Rhythm:  [-] Normal sinus rhythm (10/30 0000) Resp:  [11-21] 17 (10/30 0700) BP: (146-175)/(74-97) 155/89 mmHg (10/30 0700) SpO2:  [93 %-100 %] 93 % (10/30 0700)   Recent Labs Lab 08/02/15 2157 08/03/15 0818 08/03/15 1225 08/03/15 1827 08/03/15 2147  GLUCAP 149* 166* 121* 156* 147*    Recent Labs Lab 07/30/15 0211 07/31/15 0237 08/01/15 0233 08/03/15 0319 08/04/15 0300  NA 142 141 139 144 142  K 3.3* 3.6 3.3* 2.7* 2.7*  CL 98* 101 104 107 104  CO2 27 24 24 25 27   GLUCOSE 225* 224* 219* 164* 140*  BUN 13 9 13 9 6   CREATININE 0.61 0.48 0.53 0.54 0.44  CALCIUM 9.7 9.4 8.9 8.9 9.1    Recent Labs Lab 07/29/15 0659 08/04/15 0300  AST 43* 24  ALT 27 20  ALKPHOS 41 50  BILITOT 0.7 0.6  PROT 7.8 7.1  ALBUMIN 4.3 2.9*    Recent Labs Lab 07/29/15 0659  07/30/15 0211 07/31/15 0237 08/01/15 0233 08/03/15 0319 08/04/15 0300  WBC 12.4*  --  14.9* 12.5* 11.4* 9.8 9.8  NEUTROABS 11.0*  --   --   --   --   --   --   HGB 14.2  < > 14.7 13.9 14.1 14.5 14.1  HCT 42.5  < > 43.5 43.4 43.6 44.0 43.1  MCV 87.1  --  87.2 89.7 88.8 87.3 88.0  PLT 161  --  141* 201 206 194 173  < > = values in this interval not  displayed. No results for input(s): CKTOTAL, CKMB, CKMBINDEX, TROPONINI in the last 168 hours. No results for input(s): LABPROT, INR in the last 72 hours. No results for input(s): COLORURINE, LABSPEC, [place], GLUCOSEU, HGBUR, BILIRUBINUR, KETONESUR, PROTEINUR, UROBILINOGEN, NITRITE, LEUKOCYTESUR in the last 72 hours.  Invalid input(s): APPERANCEUR     Component Value Date/Time   CHOL 237* 07/30/2015 0211   TRIG 405* 07/30/2015 0211   HDL 47 07/30/2015 0211   CHOLHDL 5.0 07/30/2015 0211   VLDL UNABLE TO CALCULATE IF TRIGLYCERIDE OVER 400 mg/dL 07/30/2015 0211   LDLCALC UNABLE TO CALCULATE IF TRIGLYCERIDE OVER 400 mg/dL 07/30/2015 0211   Lab Results  Component Value Date   HGBA1C 6.7* 07/30/2015      Component Value Date/Time   LABOPIA NONE DETECTED 07/29/2015 0930   COCAINSCRNUR NONE DETECTED 07/29/2015 0930   LABBENZ POSITIVE* 07/29/2015 0930   AMPHETMU NONE DETECTED 07/29/2015 0930   THCU NONE DETECTED 07/29/2015 0930   LABBARB NONE DETECTED 07/29/2015 0930     Recent Labs Lab 07/29/15 0659  ETH <5    I have personally reviewed the radiological images below and agree with the radiology interpretations.  Ct Angio Head  and neck W/cm &/or Wo Cm Ct Head Wo Contrast 07/31/2015   IMPRESSION: 1. No significant interval change in the size of a right thalamic hemorrhage with extension into the brainstem. 2. Surrounding edema and midline shift is stable. 3. Slight increase in interventricular hemorrhage with layering blood products. There is no hydrocephalus. 4. Hypoattenuation in the brainstem is likely artifactual, appearance similar to the prior exam. 5. Stable atrophy and white matter disease otherwise.   07/30/2015  IMPRESSION: CT head: Evolving RIGHT thalamic intraparenchymal hematoma, no suspicious enhancement. CTA neck: No acute vascular or hemodynamically significant stenosis. Tortuous vessels compatible with chronic hypertension. CTA head:  No acute large vessel occlusion  Moderate to high-grade stenosis RIGHT P2 segment (likely thromboembolic, less likely focal vasospasm). Dolicoectatic intracranial vessels compatible with chronic hypertension.   Ct Head Wo Contrast 07/29/2015  IMPRESSION: 1. Right thalamic hemorrhage with 6 mm of midline shift from right to left. 2. Intraventricular hemorrhage. 3. Old left basal ganglia lacunar infarct. 4. 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    Ct Cervical Spine Wo Contrast 07/29/2015   IMPRESSION: 1. No maxillofacial fractures. 2. No cervical spine fracture or subluxation. 3. Cervical spine degenerative changes. 4. Mild bilateral carotid artery atheromatous calcifications.   DG Chest Port 1 View 08/04/2015 Low lung volumes.  Carotid Doppler  Bilateral: 1-39% ICA stenosis. Vertebral artery flow is antegrade.  2D Echocardiogram  - Left ventricle: The cavity size was normal. There was mild concentric hypertrophy. Systolic function was normal. The estimated ejection fraction was in the range of 60% to 65%. Wall motion was normal; there were no regional wall motion abnormalities. There was an increased relative contribution of atrial contraction to ventricular filling. Doppler parameters are consistent with abnormal left ventricular relaxation (grade 1 diastolic dysfunction). - Aortic valve: Mildly to moderately calcified annulus. Trileaflet; mildly thickened, mildly calcified leaflets. There was trivial regurgitation. - Mitral valve: Calcified annulus. Minimal diffuse calcification of the anterior leaflet and posterior leaflet. - Atrial septum: There was increased thickness of the septum, consistent with lipomatous hypertrophy. - Pulmonary arteries: PA peak pressure: 32 mm Hg (S).   PHYSICAL EXAM  Temp:  [98.1 F (36.7 C)-99.7 F (37.6 C)] 98.1 F (36.7 C) (10/30 0759) Pulse Rate:  [65-94] 90 (10/30 0700) Resp:  [11-21] 17 (10/30 0700) BP: (146-175)/(74-97)  155/89 mmHg (10/30 0700) SpO2:  [93 %-100 %] 93 % (10/30 0700)  General - Well nourished, well developed, less lethargic today. HEENT:  NCAT; pupils reactive; sclera clear; moist mucous membranes Cardiovascular - Regular rate and rhythm. Pulmonary:  CTA GI:  Abdomen soft, NT ND Extrem:  No C/C/E  Mental Status -  Level of arousal and orientation to time, place, and person were intact. Language including expression, naming, repetition, comprehension was assessed and found intact, but dysarthria and hypophonia.   Cranial Nerves II - XII - II - not blinking to visual threat on the left.  Pupils resonsive III, IV, VI - Extraocular movements intact, able to have incomplete left gaze, but right eye gaze preference. V - Facial sensation intact bilaterally. VII - left facial droop. VIII - Hearing & vestibular intact bilaterally. X - Palate elevates symmetrically, dysarthria. XI - shoulder shrug decreased on the left. XII - Tongue protrusion intact.  Motor Strength - The patient's strength was 0/5 LUE and LLE, 5/5 RUE and RLE. Bulk was normal and fasciculations were absent.    Reflexes - The patient's reflexes were symmetrical in all extremities and she had no pathological reflexes.  Sensory - Light touch, temperature/pinprick were assessed; she seems to feel less on the left; however, she reports no difference   Coordination - The patient had difficulty with detailed instruction  Gait and Station - not tested due to weakness.   ASSESSMENT/PLAN Ms. Destiny Robinson is a 59 y.o. female with history of HTN, HLD, DM, diabetic gastroparesis, GIB, obesity, OSA on CPAP admitted for acute onset left weakness causing fall. Symptoms unchanged.    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  CT repeat showed stable ICH and no hydrocephalus  CTA head and neck - right P2 stenosis but  otherwise unremarkable  Carotid Doppler  unremarkable  2D Echo  unremarkable  LDL unable to calculate due to TG 405  HgbA1c 6.7  Heparin subq for VTE prophylaxis  Diet clear liquid Room service appropriate?: Yes; Fluid consistency:: Thin   clopidogrel 75 mg daily prior to admission, now on No antithrombotic due to [place]  Patient counseled to be compliant with her antithrombotic medications  Ongoing aggressive stroke risk factor management  Therapy recommendations:  CIR  Disposition:  Pending  Cerebral edema  Midline shift  Na 142->141->139  No neuro change  Continue to monitoring  Diabetes  HgbA1c pending goal < 7.0  Uncontrolled  CBG monitoring showed improving hyperglycemia  SSI  DM education  increase levemir to 40 units today   Hypertension  Home meds:   Amlodipine, HCTZ and metoprolol  BP goal < 180   Put back on amlodipine and metoprolol  continue lisinopril for better BP control  Add hydralazine for better BP control  Cleviprex off  Decrease IVF NS as pt able to eat  Patient counseled to be compliant with her blood pressure medications  Hyperlipidemia  Home meds:  pravastatin   Currently on pravastatin and zetia  LDL not able to calculate, goal < 70  Add zetia due to high TG  Continue pravastatin and zetia at discharge  N/V  Hx of diabetic gastropresis  QTc 530->449  Stop compazine, add Zofran  Other Stroke Risk Factors  obesity  Migraines - on nortriptyline at home, doing well  Complicated migraine - left sided numbness with HA, much better, less frequent, on nortriptyline at home  Obstructive sleep apnea, on CPAP at home  Other Active Problems  Hypokalemia - supplement  Mild leukocystosis  Other Pertinent History  GIB history   Attending System Review and Plan for 08/04/2015  Neuro  CTA Head and Neck 07/31/2015 reviewed  Stable ICH and no hydrocephalus  No antithrombotic currently  Cardiac Htn  hx - SBP goal <180 - currently at goal Will increase goal to <160  Cleviprex - weaned to off  Amlodipine 10mg  daily  Metoprolol 50mg  BID with change to labetalol for additional BP effect  Lisinopril 20mg  BID  If patient continues to have difficulty with hypertension despite intervention may need to evaluate renal US and 24 hour urine  Pulmonary  Portable chest x-ray 08/02/2015 - 1. Low lung volumes. 2. Cardiomegaly without failure. 3. Left basilar airspace densities on the prior exam have cleared.  CXR - 08/04/2015 - low lung volumes  GI  Clear liquid diet - advanced to dysphagia 1 full liquids - carb modified  GU   I/O 1860 / 0 = 1860  Endo  DM - hemoglobin A1c 6.7  Inulin adjusted yesterday - CBG 149 08/03/2015  Heme / ID  CBC - normal - 08/03/2012  T 98.1 axillary  Electrolytes  K - 2.7 08/04/2015 despite supplementation - further supplementation ordered.  Hospital day # [place] PA-C [facility] Pager [phone] 08/04/2015, 8:20 AM   ATTENDING NOTE SUMMARY: This patient remains at significant risk of neurological worsening, death and care requires constant monitoring of vital signs, hemodynamics,respiratory and cardiac monitoring, extensive review of multiple databases, frequent neurological assessment, discussion with family, other specialists and medical decision making of high complexity.  However, patient can transfer to step-down unit for continued critical care management of BP.  I have made any additions or clarifications directly to the above note.This critical care time does not reflect procedure time, or teaching time or supervisory time of PA/NP/Med Resident etc but could involve care discussion time. I spent 50 minutes of neurocritical care time in the care of this patient.  SIGNED BY:   Elissa Hefty, MD MHS [facility]     To contact Stroke Continuity provider, please refer to  [email or web address] After hours, contact General Neurology

## 2015-08-04 NOTE — Progress Notes (Signed)
[facility] ADULT ICU REPLACEMENT PROTOCOL FOR AM LAB REPLACEMENT ONLY  The patient does apply for the [facility] Adult ICU Electrolyte Replacment Protocol based on the criteria listed below:   1. Is GFR >/= 40 ml/min? Yes.    Patient's GFR today is >60 2. Is urine output >/= 0.5 ml/kg/hr for the last 6 hours? Yes.   Patient's UOP is 0.9 ml/kg/hr 3. Is BUN < 60 mg/dL? Yes.    Patient's BUN today is 6 4. Abnormal electrolyte(s): K 2.7 5. Ordered repletion with: per protocol 6. If a panic level lab has been reported, has the CCM MD in charge been notified? No..   Physician:    Ronda Fairly A 08/04/2015 5:00 AM [id number]

## 2015-08-04 NOTE — Progress Notes (Signed)
Received pt by bed from 3MW awake, oriented x 2 with delayed responses, L sided neglect.  Denies pain and no non verbal cues noted. Applied sacral foam for prophylaxis given incontinence.

## 2015-08-05 DIAGNOSIS — I610 Nontraumatic intracerebral hemorrhage in hemisphere, subcortical: Principal | ICD-10-CM

## 2015-08-05 LAB — GLUCOSE, CAPILLARY
GLUCOSE-CAPILLARY: 169 mg/dL — AB (ref 65–99)
GLUCOSE-CAPILLARY: 174 mg/dL — AB (ref 65–99)
Glucose-Capillary: 154 mg/dL — ABNORMAL HIGH (ref 65–99)
Glucose-Capillary: 172 mg/dL — ABNORMAL HIGH (ref 65–99)

## 2015-08-05 LAB — CBC
HEMATOCRIT: 39.7 % (ref 36.0–46.0)
HEMOGLOBIN: 12.8 g/dL (ref 12.0–15.0)
MCH: 28.5 pg (ref 26.0–34.0)
MCHC: 32.2 g/dL (ref 30.0–36.0)
MCV: 88.4 fL (ref 78.0–100.0)
Platelets: 170 10*3/uL (ref 150–400)
RBC: 4.49 MIL/uL (ref 3.87–5.11)
RDW: 13.8 % (ref 11.5–15.5)
WBC: 8.9 10*3/uL (ref 4.0–10.5)

## 2015-08-05 LAB — COMPREHENSIVE METABOLIC PANEL
ALT: 17 U/L (ref 14–54)
ANION GAP: 9 (ref 5–15)
AST: 21 U/L (ref 15–41)
Albumin: 2.9 g/dL — ABNORMAL LOW (ref 3.5–5.0)
Alkaline Phosphatase: 50 U/L (ref 38–126)
BILIRUBIN TOTAL: 0.4 mg/dL (ref 0.3–1.2)
BUN: 7 mg/dL (ref 6–20)
CHLORIDE: 105 mmol/L (ref 101–111)
CO2: 28 mmol/L (ref 22–32)
Calcium: 9.2 mg/dL (ref 8.9–10.3)
Creatinine, Ser: 0.5 mg/dL (ref 0.44–1.00)
GFR calc Af Amer: >60 mL/min (ref >60)
Glucose, Bld: 179 mg/dL — ABNORMAL HIGH (ref 65–99)
POTASSIUM: 3.6 mmol/L (ref 3.5–5.1)
Sodium: 142 mmol/L (ref 135–145)
TOTAL PROTEIN: 6.6 g/dL (ref 6.5–8.1)

## 2015-08-05 MED ORDER — LORAZEPAM 0.5 MG PO TABS
0.5000 mg | ORAL_TABLET | Freq: Four times a day (QID) | ORAL | Status: DC
  Administered 2015-08-05 – 2015-08-06 (×7): 0.5 mg via ORAL
  Filled 2015-08-05 (×8): qty 1

## 2015-08-05 MED ORDER — NYSTATIN 100000 UNIT/ML MT SUSP
5.0000 mL | Freq: Four times a day (QID) | OROMUCOSAL | Status: AC
  Administered 2015-08-05 – 2015-08-06 (×7): 500000 [IU] via OROMUCOSAL
  Filled 2015-08-05 (×7): qty 5

## 2015-08-05 NOTE — Progress Notes (Signed)
PT Progress Note  Assessment:  Pt is making modest progress w/ PT but fatigues quickly after completing ADLs sitting EOB.  She fluctuates from close min guard assist (~2 sec)>max assist balancing sitting EOB.  Balance improves w/ Rt hand placement on pt's lap to avoid pushing to Lt.  Total +2 assist for sit<>stand as pt minimally WB through Lt LE.  Pt remains engaged throughout session and remains appropriate for CIR to increase pt's functional independence and safety.  Pt will benefit from continued skilled PT services to increase functional independence and safety.  Joslyn Hy PT, DPT [phone] Pager: [phone]    08/05/15 1048  PT Visit Information  Last PT Received On 08/05/15  Assistance Needed +2  PT/OT/SLP Co-Evaluation/Treatment Yes  Reason for Co-Treatment Complexity of the patient's impairments (multi-system involvement);For patient/therapist safety  PT goals addressed during session Mobility/safety with mobility;Balance  History of Present Illness Destiny Robinson is an 59 y.o. female with a history of multiple medical problems who reports awakening this morning and attempting to get out of bed. Patient fell and hit the nightstand. Was unable get off the floor. EMS was called and patient was brought in as a code stroke. Initial NIHSS   PT Time Calculation  PT Start Time (ACUTE ONLY) 1006  PT Stop Time (ACUTE ONLY) 1036  PT Time Calculation (min) (ACUTE ONLY) 30 min  Precautions  Precautions Fall  Restrictions  Weight Bearing Restrictions No  Pain Assessment  Pain Assessment No/denies pain  Cognition  Arousal/Alertness Awake/alert  Behavior During Therapy Flat affect  Overall Cognitive Status Impaired/Different from baseline  Area of Impairment Awareness;Problem solving;Safety/judgement;Attention (Simultaneous filing. User may not have seen previous data.)  Current Attention Level Selective  Following Commands Follows one step commands with increased time   Safety/Judgement Decreased awareness of safety;Decreased awareness of deficits  Awareness Intellectual  Problem Solving Slow processing;Decreased initiation;Difficulty sequencing;Requires verbal cues;Requires tactile cues  General Comments Inc time for pt to answer questions (Simultaneous filing. User may not have seen previous data.)  Bed Mobility  Overal bed mobility Needs Assistance;+2 for physical assistance  Bed Mobility Supine to Sit;Sit to Supine;Rolling (Simultaneous filing. User may not have seen previous data.)  Rolling Max assist  Supine to sit +2 for physical assistance;Mod assist;HOB elevated (Simultaneous filing. User may not have seen previous data.)  Sit to supine Total assist;+2 for physical assistance  General bed mobility comments Pt did initiate with R UE and R LE.  Cues for hand placement.  Support provided at trunk and assist w/ Lt UE/LE.  Mod +1 assist for scotting Rt hips to EOB, total assist for scooting Lt hips to EOB using bed pad.  Total +2 assist to return to supine 2/2 pt's fatigue after activities completed sitting EOB. (Simultaneous filing. User may not have seen previous data.)  Transfers  Overall transfer level Needs assistance  Equipment used 2 person hand held assist  Transfers Sit to/from Stand  Sit to Stand +2 physical assistance;Total assist;From elevated surface (Simultaneous filing. User may not have seen previous data.)  General transfer comment Used gait belt, blocking Lt knee.  Pt w/ strong push to Lt and minimally WB through Lt LE.  Pericare provided in standing. (Simultaneous filing. User may not have seen previous data.)  Modified Rankin (Stroke Patients Only)  Pre-Morbid Rankin Score 0  Modified Rankin 5  Balance  Overall balance assessment Needs assistance  Sitting-balance support Single extremity supported;Feet supported  Sitting balance-Leahy Scale Poor  Sitting balance - Comments Close  min guard assist>total assist, strong push  toward Lt with Rt UE.  Pt leans on Lt elbow but quickly loses her balance.  Cues for Rt hand placement in lap which allows pt to sit w/ close min guard assist for ~2 seconds.   (Simultaneous filing. User may not have seen previous data.)  Postural control Posterior lean;Left lateral lean  Standing balance support During functional activity  Standing balance-Leahy Scale Zero  General Comments  General comments (skin integrity, edema, etc.) Continued to encourage pt's family to sit on Lt side of room, which they have been doing.  Windows opened to cue pt to look Lt.  SpO2 drop x1 to 89% once sitting EOB, quickly returning to high 90's following cues for breathing technique.  Otherwise, VSS.  PT - End of Session  Equipment Utilized During Treatment Gait belt  Activity Tolerance Patient limited by fatigue  Patient left in bed;with call bell/phone within reach;with bed alarm set;with family/visitor present;with SCD's reapplied  Nurse Communication Mobility status;Need for lift equipment  PT - Assessment/Plan  PT Plan Current plan remains appropriate  PT Frequency (ACUTE ONLY) Min 4X/week  Recommendations for Other Services Rehab consult  Follow Up Recommendations CIR;Supervision/Assistance - 24 hour  PT equipment None recommended by PT  PT Goal Progression  Progress towards PT goals Progressing toward goals  PT General Charges  $$ ACUTE PT VISIT 1 Procedure  PT Treatments  $Therapeutic Activity 8-22 mins

## 2015-08-05 NOTE — Progress Notes (Addendum)
Occupational Therapy Treatment Patient Details Name: Destiny Robinson MRN: [id number] DOB: Sep 26, 1956 Today's Date: 08/05/2015    History of present illness Destiny Robinson is an 59 y.o. female with a history of multiple medical problems who reports awakening this morning and attempting to get out of bed. Patient fell and hit the nightstand. Was unable get off the floor. EMS was called and patient was brought in as a code stroke. Initial NIHSS    OT comments  Pt participated in ADLs during session. Continue to recommend CIR for rehab.  Follow Up Recommendations  CIR    Equipment Recommendations  3 in 1 bedside comode;Tub/shower bench    Recommendations for Other Services      Precautions / Restrictions Precautions Precautions: Fall Restrictions Weight Bearing Restrictions: No       Mobility Bed Mobility Overal bed mobility: Needs Assistance;+2 for physical assistance Bed Mobility: Supine to Sit;Sit to Supine;Rolling Rolling: Max assist   Supine to sit: +2 for physical assistance;Mod assist;HOB elevated Sit to supine: Total assist;+2 for physical assistance   General bed mobility comments: Pt did initiate with R UE and R LE.  Cues for hand placement.  Support provided at trunk and assist w/ Lt UE/LE.  Mod +1 assist for scotting Rt hips to EOB, total assist for scooting Lt hips to EOB using bed pad.  Total +2 assist to return to supine 2/2 pt's fatigue after activities completed sitting EOB.  Transfers Overall transfer level: Needs assistance Equipment used: 2 person assist Transfers: Sit to/from Stand Sit to Stand: +2 physical assistance;Total assist;From elevated surface         General transfer comment: Used gait belt, blocking Lt knee.  Pt w/ strong push to Lt and minimally WB through Lt LE.  Pericare provided in standing.    Balance Overall balance assessment: Needs assistance Sitting-balance support: Single extremity supported;Feet supported Sitting  balance-Leahy Scale: Poor Sitting balance - Comments: Close min guard assist>total assist, strong push toward Lt with Rt UE.  Pt leans on Lt elbow but quickly loses her balance.  Cues for Rt hand placement in lap which allows pt to sit w/ close min guard assist for ~2 seconds.   Postural control: Posterior lean;Left lateral lean Standing balance support: During functional activity Standing balance-Leahy Scale: Zero                     ADL Overall ADL's : Needs assistance/impaired     Grooming: Wash/dry face;Brushing hair;Applying deodorant;Maximal assistance;Sitting Grooming Details (indicate cue type and reason): Min-Max assist for balance; assist with deodorant task Upper Body Bathing: Maximal assistance;Sitting Upper Body Bathing Details (indicate cue type and reason): assist with balance and to lift LUE Lower Body Bathing: Sit to/from stand;Total assistance;+2 for physical assistance Lower Body Bathing Details (indicate cue type and reason): washed off bottom   Upper Body Dressing Details (indicate cue type and reason): OT assisted-pt with lines; Pt able to put right arm in gown, but OT threaded left arm into gown. Lower Body Dressing: Total assistance;Bed level Lower Body Dressing Details (indicate cue type and reason): pt able to pull sock on minimally on right foot Toilet Transfer: Total assistance;+2 for physical assistance (sit to stand from bed)             General ADL Comments: Encouraged pt to try to look to the left in session and try to locate ADL items towards left-difficulty doing this. Encouraged family to continue sitting on pt's left side  and to place pillow under LUE. Pillow was placed under LUE at end of session.      Vision                     Perception     Praxis      Cognition   Behavior During Therapy: Flat affect Overall Cognitive Status: Impaired/Different from baseline Area of Impairment: Awareness;Problem  solving;Safety/judgement;Following commands;Attention   Current Attention Level: Selective    Following Commands: Follows one step commands with increased time (inconsistent as she did not thread LUE first into gown despite cue to do so) Safety/Judgement: Decreased awareness of deficits;Decreased awareness of safety Awareness: Intellectual Problem Solving: Slow processing;Decreased initiation;Difficulty sequencing;Requires verbal cues;Requires tactile cues General Comments: Increased time for pt to answer questions    Extremity/Trunk Assessment               Exercises     Shoulder Instructions       General Comments      Pertinent Vitals/ Pain       Pain Assessment: No/denies pain (at beginning and end of session)  Home Living                                          Prior Functioning/Environment              Frequency Min 3X/week     Progress Toward Goals  OT Goals(current goals can now be found in the care plan section)  Progress towards OT goals: Progressing toward goals  Acute Rehab OT Goals OT Goal Formulation: With patient/family Time For Goal Achievement: 08/14/15 Potential to Achieve Goals: Fair ADL Goals Pt Will Perform Eating: with min assist;sitting Pt Will Perform Grooming: with min assist;sitting Pt/caregiver will Perform Home Exercise Program: Right Upper extremity;Independently Additional ADL Goal #1: Pt will locate food or ADL items L of midline with min verbal cues and head turn Additional ADL Goal #2: Pt will perform bed mobility with moderate assist to help with positioning and care. Additional ADL Goal #3: Pt will sit EOB with min guard assistance x 3 minutes.  Plan Discharge plan remains appropriate    Co-evaluation    PT/OT/SLP Co-Evaluation/Treatment: Yes Reason for Co-Treatment: For patient/therapist safety PT goals addressed during session: Mobility/safety with mobility;Balance OT goals addressed during  session: ADL's and self-care;Other (comment) (functional mobility)      End of Session Equipment Utilized During Treatment: Gait belt   Activity Tolerance Patient limited by fatigue   Patient Left with bed alarm set;with call bell/phone within reach;in bed;with family/visitor present; SCDs reapplied   Nurse Communication          Time: 7253-6644 OT Time Calculation (min): 30 min  Charges: OT General Charges $OT Visit: 1 Procedure OT Treatments $Self Care/Home Management : 8-22 mins  Benito Mccreedy OTR/L [phone] 08/05/2015, 11:38 AM

## 2015-08-05 NOTE — Progress Notes (Signed)
Rehab admissions - I met with patient, sister, and daughter at the bedside.  I gave them rehab booklets.  Will await PT notes today to determine if patient is participating more fully.  I can then open the case with [facility] medicare and submit request for acute inpatient rehab admission.  Call me for questions.  #[phone]

## 2015-08-05 NOTE — Progress Notes (Signed)
STROKE TEAM PROGRESS NOTE   SUBJECTIVE (INTERVAL HISTORY) "I have a yeast infection". Daughters at bedside. All feel she is ready for rehab. Await bed. They also want her to restart her nerve medicine.   OBJECTIVE Temp:  [98.4 F (36.9 C)-99.2 F (37.3 C)] 99 F (37.2 C) (10/31 0700) Pulse Rate:  [84-107] 85 (10/31 0454) Cardiac Rhythm:  [-] Normal sinus rhythm (10/31 0738) Resp:  [12-17] 16 (10/31 0454) BP: (128-178)/(71-94) 159/94 mmHg (10/31 0454) SpO2:  [93 %-99 %] 95 % (10/31 0454) Weight:  [90.7 kg (199 lb 15.3 oz)] 90.7 kg (199 lb 15.3 oz) (10/30 1700)   Recent Labs Lab 08/04/15 0832 08/04/15 1245 08/04/15 1714 08/04/15 2134 08/05/15 0750  GLUCAP 148* 193* 225* 177* 154*    Recent Labs Lab 08/01/15 0233 08/03/15 0319 08/04/15 0300 08/04/15 0750 08/05/15 0340  NA 139 144 142 141 142  K 3.3* 2.7* 2.7* 3.2* 3.6  CL 104 107 104 106 105  CO2 24 25 27 25 28   GLUCOSE 219* 164* 140* 169* 179*  BUN 13 9 6 6 7   CREATININE 0.53 0.54 0.44 0.47 0.50  CALCIUM 8.9 8.9 9.1 8.9 9.2    Recent Labs Lab 08/04/15 0300 08/05/15 0340  AST 24 21  ALT 20 17  ALKPHOS 50 50  BILITOT 0.6 0.4  PROT 7.1 6.6  ALBUMIN 2.9* 2.9*    Recent Labs Lab 07/31/15 0237 08/01/15 0233 08/03/15 0319 08/04/15 0300 08/05/15 0340  WBC 12.5* 11.4* 9.8 9.8 8.9  HGB 13.9 14.1 14.5 14.1 12.8  HCT 43.4 43.6 44.0 43.1 39.7  MCV 89.7 88.8 87.3 88.0 88.4  PLT 201 206 194 173 170       Component Value Date/Time   CHOL 237* 07/30/2015 0211   TRIG 405* 07/30/2015 0211   HDL 47 07/30/2015 0211   CHOLHDL 5.0 07/30/2015 0211   VLDL UNABLE TO CALCULATE IF TRIGLYCERIDE OVER 400 mg/dL 07/30/2015 0211   LDLCALC UNABLE TO CALCULATE IF TRIGLYCERIDE OVER 400 mg/dL 07/30/2015 0211   Lab Results  Component Value Date   HGBA1C 6.7* 07/30/2015      Component Value Date/Time   LABOPIA NONE DETECTED 07/29/2015 0930   COCAINSCRNUR NONE DETECTED 07/29/2015 0930   LABBENZ POSITIVE* 07/29/2015 0930    AMPHETMU NONE DETECTED 07/29/2015 0930   THCU NONE DETECTED 07/29/2015 0930   LABBARB NONE DETECTED 07/29/2015 0930    No results for input(s): ETH in the last 168 hours.   Ct Angio Head and neck W/cm &/or Wo Cm Ct Head Wo Contrast 07/31/2015   IMPRESSION: 1. No significant interval change in the size of a right thalamic hemorrhage with extension into the brainstem. 2. Surrounding edema and midline shift is stable. 3. Slight increase in interventricular hemorrhage with layering blood products. There is no hydrocephalus. 4. Hypoattenuation in the brainstem is likely artifactual, appearance similar to the prior exam. 5. Stable atrophy and white matter disease otherwise.   07/30/2015  IMPRESSION: CT head: Evolving RIGHT thalamic intraparenchymal hematoma, no suspicious enhancement. CTA neck: No acute vascular or hemodynamically significant stenosis. Tortuous vessels compatible with chronic hypertension. CTA head:  No acute large vessel occlusion Moderate to high-grade stenosis RIGHT P2 segment (likely thromboembolic, less likely focal vasospasm). Dolicoectatic intracranial vessels compatible with chronic hypertension.   Ct Head Wo Contrast 07/29/2015  IMPRESSION: 1. Right thalamic hemorrhage with 6 mm of midline shift from right to left. 2. Intraventricular hemorrhage. 3. Old left basal ganglia lacunar infarct. 4. 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  Ct Cervical Spine Wo Contrast 07/29/2015   IMPRESSION: 1. No maxillofacial fractures. 2. No cervical spine fracture or subluxation. 3. Cervical spine degenerative changes. 4. Mild bilateral carotid artery atheromatous calcifications.   DG Chest Port 1 View 08/04/2015 Low lung volumes.  Carotid Doppler  Bilateral: 1-39% ICA stenosis. Vertebral artery flow is antegrade.  2D Echocardiogram   - Left ventricle: The cavity size was normal. There was mildconcentric hypertrophy. Systolic function was normal.  Theestimated ejection fraction was in the range of 60% to 65%. Wallmotion was normal; there were no regional wall motionabnormalities. There was an increased relative contribution ofatrial contraction to ventricular filling. Doppler parameters areconsistent with abnormal left ventricular relaxation (grade 1diastolic dysfunction). - Aortic valve: Mildly to moderately calcified annulus. Trileaflet;mildly thickened, mildly calcified leaflets. There was trivialregurgitation. - Mitral valve: Calcified annulus. Minimal diffuse calcification ofthe anterior leaflet and posterior leaflet. - Atrial septum: There was increased thickness of the septum,consistent with lipomatous hypertrophy. - Pulmonary arteries: PA peak pressure: 32 mm Hg (S).   PHYSICAL EXAM General - Well nourished, well developed,  HEENT:  NCAT; pupils reactive; sclera clear; moist mucous membranes Cardiovascular - Regular rate and rhythm. Pulmonary:  CTA GI:  Abdomen soft, NT ND Extrem:  No C/C/E  Mental Status -  Awake alert and interactive Level of arousal and orientation to time, place, and person were intact. Language including expression, naming, repetition, comprehension was assessed and found intact, but mild dysarthria   Cranial Nerves II - XII - II - not blinking to visual threat on the left.  Pupils resonsive III, IV, VI - Extraocular movements intact, able to have incomplete left gaze, but right eye gaze preference. V - Facial sensation intact bilaterally. VII - left facial droop. VIII - Hearing & vestibular intact bilaterally. X - Palate elevates symmetrically, dysarthria. XI - shoulder shrug decreased on the left. XII - Tongue protrusion intact.  Motor Strength - The patient's strength was 0/5 LUE and LLE, 5/5 RUE and RLE. Bulk was normal and fasciculations were absent.    Reflexes - The patient's reflexes were symmetrical in all extremities and she had no pathological reflexes.  Sensory - Light touch,  temperature/pinprick were assessed; she seems to feel less on the left; however, she reports no difference   Coordination - The patient had difficulty with detailed instruction  Gait and Station - not tested due to weakness.   ASSESSMENT/PLAN Destiny Robinson is a 59 y.o. female with history of HTN, HLD, DM, diabetic gastroparesis, GIB, obesity, OSA on CPAP admitted for acute onset left weakness causing fall. Symptoms unchanged.    Right BG bleeding with intraventricular extension - likely hypertensive   Stable ICH and no hydrocephalus  Resultant - left hemiplegia, dysarthria, left neglect  CT showed right BG ICH with IVH  CT repeat showed stable ICH and no hydrocephalus  CTA head and neck - right P2 stenosis but otherwise unremarkable  Carotid Doppler  unremarkable  2D Echo  unremarkable  LDL unable to calculate due to TG 405  HgbA1c 6.7  Heparin subq for VTE prophylaxis  DIET - DYS 1 Room service appropriate?: No; Fluid consistency:: Thin   clopidogrel 75 mg daily prior to admission, now on No antithrombotic due to [place]  Patient counseled to be compliant with her antithrombotic medications  Ongoing aggressive stroke risk factor management  Therapy recommendations:  CIR. Consult in place. Medically ready for discharge once bed available  Disposition:  pending   Cerebral edema  Midline shift  Na 142->141->139  No neuro change  stable  Diabetes  HgbA1c 6.7 goal < 7.0  Uncontrolled  CBG monitoring showed improving hyperglycemia  SSI  DM education  increased levemir to 40 units today   Hypertension  Home meds:   Amlodipine, HCTZ and metoprolol  BP goal < 160   Put back on amlodipine 10  Metoprolol 50 bid changed to labetolol 50   continue lisinopril 20 for better BP control  Add hydralazine for better BP control  Cleviprex off  Patient counseled to be compliant with her blood pressure medications  Hyperlipidemia  Home meds:   pravastatin   Currently on pravastatin and zetia  LDL not able to calculate, goal < 70  Added zetia due to high TG  Continue pravastatin and zetia at discharge  N/V  Hx of diabetic gastropresis  QTc 530->449  Stop compazine, add Zofran  Other Stroke Risk Factors  obesity  Migraines - on nortriptyline at home, doing well  Complicated migraine - left sided numbness with HA, much better, less frequent, on nortriptyline at home  Obstructive sleep apnea, on CPAP at home  Other Active Problems  Hypokalemia - supplement. K - 2.7 08/04/2015 despite supplementation - further supplementation ordered - > 3.6 today  Mild leukocytosis  CXR with low lung volumes  Oral thrush. Added nystatin x 7 days  Anixety. Resumed ativan  Other Pertinent History  GIB history  Hospital day # [place] for Pager information 08/05/2015 11:39 AM  I have personally examined this patient, reviewed notes, independently viewed imaging studies, participated in medical decision making and plan of care. I have made any additions or clarifications directly to the above note. Agree with note above. We will treat thrush with nystatin swish and swallow This patient is critically ill and at significant risk of neurological worsening, death and care requires constant monitoring of vital signs, hemodynamics,respiratory and cardiac monitoring, extensive review of multiple databases, frequent neurological assessment, discussion with family, other specialists and medical decision making of high complexity.I have made any additions or clarifications directly to the above note.This critical care time does not reflect procedure time, or teaching time or supervisory time of PA/NP/Med Resident etc but could involve care discussion time. I spent 30 minutes of neurocritical care time in the care of this patient. Antony Contras, MD Medical Director [facility] Stroke  Center Pager: [phone] 08/05/2015 2:59 PM  To contact Stroke Continuity provider, please refer to [email or web address] After hours, contact General Neurology

## 2015-08-05 NOTE — Progress Notes (Signed)
Speech Language Pathology Treatment: Dysphagia;Cognitive-Linquistic  Patient Details Name: Destiny Robinson MRN: [id number] DOB: March 21, 1956 Today's Date: 08/05/2015 Time: 0130-0200 SLP Time Calculation (min) (ACUTE ONLY): 30 min  Assessment / Plan / Recommendation Clinical Impression  During cognitive and dysphagia treatment, SLP provided fading maximal to moderate verbal, tactile, and visual cues for pt to attend to her left visual field and follow solids with liquids. Pt did not demonstrate any overt s/s of aspiration but does pocket food; she independently takes multiple bites prior to swallowing the entire bolus. SLP structured eating the meal and attending to the left by providing multisensory cues. Pt is aware that she had a stroke and has trouble swallowing, but by end of session despite max to moderate verbal, tactile, and visual cues pt did not state intellectual/emerging awareness of left neglect. Pt continues to have limited verbal output, no spontaneous conversation, but does answer simple questions, basic auditory comprehension appears intact. SLP recommends continued cognitive and dysphagia treatment, will trial solid upgrades.    HPI Other Pertinent Information: 59 y.o. female presenting with acute onset left sided weakness. 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    Pertinent Vitals Pain Assessment: Faces Faces Pain Scale: No hurt  SLP Plan  Continue with current plan of care    Recommendations Diet recommendations: Dysphagia 1 (puree);Thin liquid Liquids provided via: Cup Medication Administration: Crushed with puree Supervision: Patient able to self feed;Full supervision/cueing for compensatory strategies Compensations: Minimize environmental distractions;Slow rate;Small sips/bites;Check for pocketing;Follow solids with liquid Postural Changes and/or Swallow Maneuvers: Seated  upright 90 degrees;Upright 30-60 min after meal              Oral Care Recommendations: Oral care QID Follow up Recommendations: Inpatient Rehab Plan: Continue with current plan of care    [place], Student-SLP  Lanier Ensign 08/05/2015, 2:27 PM

## 2015-08-06 LAB — GLUCOSE, CAPILLARY
GLUCOSE-CAPILLARY: 149 mg/dL — AB (ref 65–99)
GLUCOSE-CAPILLARY: 201 mg/dL — AB (ref 65–99)
Glucose-Capillary: 155 mg/dL — ABNORMAL HIGH (ref 65–99)
Glucose-Capillary: 207 mg/dL — ABNORMAL HIGH (ref 65–99)

## 2015-08-06 MED ORDER — POLYVINYL ALCOHOL 1.4 % OP SOLN
1.0000 [drp] | Freq: Three times a day (TID) | OPHTHALMIC | Status: DC
  Administered 2015-08-06 – 2015-08-07 (×5): 1 [drp] via OPHTHALMIC
  Filled 2015-08-06: qty 15

## 2015-08-06 NOTE — Progress Notes (Signed)
Physical Therapy Treatment Patient Details Name: Destiny Robinson MRN: [id number] DOB: 02/06/1956 Today's Date: 08/06/2015    History of Present Illness Destiny Robinson is an 59 y.o. female with a history of multiple medical problems who reports awakening this morning and attempting to get out of bed. Patient fell and hit the nightstand. Was unable get off the floor. EMS was called and patient was brought in as a code stroke. Initial NIHSS     PT Comments    Ms. Muradyan was able to participate more today 2/2 limited time spent sitting EOB prior to stand pivot to recliner chair, although still requiring max +2 assist.  Per IP Rehab Coordinator's note, pt will need to go SNF.  Pt will benefit from continued skilled PT services to increase functional independence and safety.   Follow Up Recommendations  Supervision/Assistance - 24 hour;SNF (Per IP rehab screening note, pt will need to go SNF)     Equipment Recommendations  None recommended by PT    Recommendations for Other Services       Precautions / Restrictions Precautions Precautions: Fall Restrictions Weight Bearing Restrictions: No    Mobility  Bed Mobility Overal bed mobility: Needs Assistance;+2 for physical assistance Bed Mobility: Rolling;Sidelying to Sit Rolling: Max assist;+2 for physical assistance Sidelying to sit: Max assist;+2 for physical assistance       General bed mobility comments: Tactile and verbal cues to encourage pt initiation to push up on Rt elbow from sidelying to sit w/o success.  Although pt assists minimally once support provided to trunk.  Pt able to sit EOB leaning on Rt elbow w/ min assist for stability for ~1 minute.  Transfers Overall transfer level: Needs assistance Equipment used: 2 person hand held assist Transfers: Sit to/from [facility] Sit to Stand: +2 physical assistance;Total assist Stand pivot transfers: Max assist;+2 physical assistance       General  transfer comment: Used gait belt, blocking knees Bil.  Bed pad used to boost pt up to standing and to complete stand pivot.  Ambulation/Gait                 Stairs            Wheelchair Mobility    Modified Rankin (Stroke Patients Only) Modified Rankin (Stroke Patients Only) Pre-Morbid Rankin Score: No symptoms Modified Rankin: Severe disability     Balance Overall balance assessment: Needs assistance Sitting-balance support: Single extremity supported;Feet supported Sitting balance-Leahy Scale: Poor Sitting balance - Comments: Pt requires min guard assist>total assist again today for sitting EOB.   Postural control: Posterior lean;Left lateral lean Standing balance support: Bilateral upper extremity supported;During functional activity Standing balance-Leahy Scale: Zero                      Cognition Arousal/Alertness: Awake/alert Behavior During Therapy: Flat affect Overall Cognitive Status: Impaired/Different from baseline Area of Impairment: Awareness;Problem solving;Safety/judgement;Attention;Memory;Orientation Orientation Level: Disoriented to;Place Current Attention Level: Selective Memory: Decreased short-term memory Following Commands: Follows one step commands with increased time;Follows one step commands inconsistently Safety/Judgement: Decreased awareness of safety;Decreased awareness of deficits Awareness: Intellectual Problem Solving: Slow processing;Decreased initiation;Difficulty sequencing;Requires verbal cues;Requires tactile cues General Comments: Inc time for pt to answer questions and pt making random comments such as "Let's go square dancing".  Does not seem as oriented today, RN says this might be due to recent Ativan.    Exercises Other Exercises Other Exercises: Pt provided washcloth and pt cleans face.  She  requires cues to initiate fixing her hair on her Lt.    General Comments General comments (skin integrity, edema, etc.):  Limited pt to sitting EOB only ~5 minutes prior to attempting transfer to chair which appeared to allow pt to participate more w/ transfer today.  Advised RN that pt should not sit in chair much longer than ~1 hr as she will likely fatigue quickly.      Pertinent Vitals/Pain Pain Assessment: No/denies pain    Home Living                      Prior Function            PT Goals (current goals can now be found in the care plan section) Acute Rehab PT Goals Patient Stated Goal: none stated PT Goal Formulation: Patient unable to participate in goal setting Time For Goal Achievement: 08/14/15 Potential to Achieve Goals: Good Progress towards PT goals: Progressing toward goals    Frequency  Min 3X/week    PT Plan Discharge plan needs to be updated    Co-evaluation PT/OT/SLP Co-Evaluation/Treatment: Yes           End of Session Equipment Utilized During Treatment: Gait belt Activity Tolerance: Patient limited by fatigue Patient left: with call bell/phone within reach;in chair     Time: 1007-1219 PT Time Calculation (min) (ACUTE ONLY): 39 min  Charges:  $Therapeutic Activity: 38-52 mins                    G Codes:      Joslyn Hy PT, DPT [phone] Pager: [phone] 08/06/2015, 2:19 PM

## 2015-08-06 NOTE — Progress Notes (Signed)
STROKE TEAM PROGRESS NOTE   SUBJECTIVE (INTERVAL HISTORY)   Daughters at bedside. All feel she is ready for rehab. Await bed.   OBJECTIVE Temp:  [98.6 F (37 C)-100.2 F (37.9 C)] 100.2 F (37.9 C) (11/01 1145) Pulse Rate:  [86-101] 88 (11/01 1300) Cardiac Rhythm:  [-] Normal sinus rhythm (11/01 1300) Resp:  [13-26] 19 (11/01 1300) BP: (138-153)/(75-92) 138/75 mmHg (11/01 1300) SpO2:  [92 %-96 %] 93 % (11/01 1300) Weight:  [192 lb 14.4 oz (87.5 kg)] 192 lb 14.4 oz (87.5 kg) (11/01 0400)   Recent Labs Lab 08/05/15 1222 08/05/15 1619 08/05/15 2123 08/06/15 0726 08/06/15 1146  GLUCAP 169* 174* 172* 155* 207*    Recent Labs Lab 08/01/15 0233 08/03/15 0319 08/04/15 0300 08/04/15 0750 08/05/15 0340  NA 139 144 142 141 142  K 3.3* 2.7* 2.7* 3.2* 3.6  CL 104 107 104 106 105  CO2 24 25 27 25 28   GLUCOSE 219* 164* 140* 169* 179*  BUN 13 9 6 6 7   CREATININE 0.53 0.54 0.44 0.47 0.50  CALCIUM 8.9 8.9 9.1 8.9 9.2    Recent Labs Lab 08/04/15 0300 08/05/15 0340  AST 24 21  ALT 20 17  ALKPHOS 50 50  BILITOT 0.6 0.4  PROT 7.1 6.6  ALBUMIN 2.9* 2.9*    Recent Labs Lab 07/31/15 0237 08/01/15 0233 08/03/15 0319 08/04/15 0300 08/05/15 0340  WBC 12.5* 11.4* 9.8 9.8 8.9  HGB 13.9 14.1 14.5 14.1 12.8  HCT 43.4 43.6 44.0 43.1 39.7  MCV 89.7 88.8 87.3 88.0 88.4  PLT 201 206 194 173 170       Component Value Date/Time   CHOL 237* 07/30/2015 0211   TRIG 405* 07/30/2015 0211   HDL 47 07/30/2015 0211   CHOLHDL 5.0 07/30/2015 0211   VLDL UNABLE TO CALCULATE IF TRIGLYCERIDE OVER 400 mg/dL 07/30/2015 0211   LDLCALC UNABLE TO CALCULATE IF TRIGLYCERIDE OVER 400 mg/dL 07/30/2015 0211   Lab Results  Component Value Date   HGBA1C 6.7* 07/30/2015      Component Value Date/Time   LABOPIA NONE DETECTED 07/29/2015 0930   COCAINSCRNUR NONE DETECTED 07/29/2015 0930   LABBENZ POSITIVE* 07/29/2015 0930   AMPHETMU NONE DETECTED 07/29/2015 0930   THCU NONE DETECTED 07/29/2015  0930   LABBARB NONE DETECTED 07/29/2015 0930    No results for input(s): ETH in the last 168 hours.   Ct Angio Head and neck W/cm &/or Wo Cm Ct Head Wo Contrast 07/31/2015   IMPRESSION: 1. No significant interval change in the size of a right thalamic hemorrhage with extension into the brainstem. 2. Surrounding edema and midline shift is stable. 3. Slight increase in interventricular hemorrhage with layering blood products. There is no hydrocephalus. 4. Hypoattenuation in the brainstem is likely artifactual, appearance similar to the prior exam. 5. Stable atrophy and white matter disease otherwise.   07/30/2015  IMPRESSION: CT head: Evolving RIGHT thalamic intraparenchymal hematoma, no suspicious enhancement. CTA neck: No acute vascular or hemodynamically significant stenosis. Tortuous vessels compatible with chronic hypertension. CTA head:  No acute large vessel occlusion Moderate to high-grade stenosis RIGHT P2 segment (likely thromboembolic, less likely focal vasospasm). Dolicoectatic intracranial vessels compatible with chronic hypertension.   Ct Head Wo Contrast 07/29/2015  IMPRESSION: 1. Right thalamic hemorrhage with 6 mm of midline shift from right to left. 2. Intraventricular hemorrhage. 3. Old left basal ganglia lacunar infarct. 4. 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    Ct Cervical Spine Wo Contrast 07/29/2015   IMPRESSION:  1. No maxillofacial fractures. 2. No cervical spine fracture or subluxation. 3. Cervical spine degenerative changes. 4. Mild bilateral carotid artery atheromatous calcifications.   DG Chest Port 1 View 08/04/2015 Low lung volumes.  Carotid Doppler  Bilateral: 1-39% ICA stenosis. Vertebral artery flow is antegrade.  2D Echocardiogram   - Left ventricle: The cavity size was normal. There was mildconcentric hypertrophy. Systolic function was normal. Theestimated ejection fraction was in the range of 60% to 65%. Wallmotion was  normal; there were no regional wall motionabnormalities. There was an increased relative contribution ofatrial contraction to ventricular filling. Doppler parameters areconsistent with abnormal left ventricular relaxation (grade 1diastolic dysfunction). - Aortic valve: Mildly to moderately calcified annulus. Trileaflet;mildly thickened, mildly calcified leaflets. There was trivialregurgitation. - Mitral valve: Calcified annulus. Minimal diffuse calcification ofthe anterior leaflet and posterior leaflet. - Atrial septum: There was increased thickness of the septum,consistent with lipomatous hypertrophy. - Pulmonary arteries: PA peak pressure: 32 mm Hg (S).   PHYSICAL EXAM General - Well nourished, well developed,  HEENT:  NCAT; pupils reactive; sclera clear; moist mucous membranes Cardiovascular - Regular rate and rhythm. Pulmonary:  CTA GI:  Abdomen soft, NT ND Extrem:  No C/C/E  Mental Status -  Awake alert and interactive Level of arousal and orientation to time, place, and person were intact. Language including expression, naming, repetition, comprehension was assessed and found intact, but mild dysarthria  and mild left hemi-neglect. Cranial Nerves II - XII - II - not blinking to visual threat on the left.  Pupils resonsive III, IV, VI - Extraocular movements intact, able to have incomplete left gaze, but right eye gaze preference. V - Facial sensation intact bilaterally. VII - left facial droop. VIII - Hearing & vestibular intact bilaterally. X - Palate elevates symmetrically, dysarthria. XI - shoulder shrug decreased on the left. XII - Tongue protrusion intact.  Motor Strength - The patient's strength was 0/5 LUE and LLE, 5/5 RUE and RLE. Bulk was normal and fasciculations were absent.    Reflexes - The patient's reflexes were symmetrical in all extremities and she had no pathological reflexes.  Sensory - Light touch, temperature/pinprick were assessed; she seems to  feel less on the left; however, she reports no difference left hemi-neglect  Coordination - The patient had difficulty with detailed instruction  Gait and Station - not tested due to weakness.   ASSESSMENT/PLAN Destiny Robinson is a 59 y.o. female with history of HTN, HLD, DM, diabetic gastroparesis, GIB, obesity, OSA on CPAP admitted for acute onset left weakness causing fall. Symptoms unchanged.    Right BG bleeding with intraventricular extension - likely hypertensive   Stable ICH and no hydrocephalus  Resultant - left hemiplegia, dysarthria, left neglect  CT showed right BG ICH with IVH  CT repeat showed stable ICH and no hydrocephalus  CTA head and neck - right P2 stenosis but otherwise unremarkable  Carotid Doppler  unremarkable  2D Echo  unremarkable  LDL unable to calculate due to TG 405  HgbA1c 6.7  Heparin subq for VTE prophylaxis  DIET - DYS 1 Room service appropriate?: No; Fluid consistency:: Thin   clopidogrel 75 mg daily prior to admission, now on No antithrombotic due to [place]  Patient counseled to be compliant with her antithrombotic medications  Ongoing aggressive stroke risk factor management  Therapy recommendations:  CIR. Consult in place. Medically ready for discharge once bed available  Disposition:  pending   Cerebral edema  Midline shift  Na 142->141->139  No neuro  change  stable  Diabetes  HgbA1c 6.7 goal < 7.0  Uncontrolled  CBG monitoring showed improving hyperglycemia  SSI  DM education  increased levemir to 40 units today   Hypertension  Home meds:   Amlodipine, HCTZ and metoprolol  BP goal < 160   Put back on amlodipine 10  Metoprolol 50 bid changed to labetolol 50   continue lisinopril 20 for better BP control  Add hydralazine for better BP control  Cleviprex off  Patient counseled to be compliant with her blood pressure medications  Hyperlipidemia  Home meds:  pravastatin   Currently on  pravastatin and zetia  LDL not able to calculate, goal < 70  Added zetia due to high TG  Continue pravastatin and zetia at discharge  N/V  Hx of diabetic gastropresis  QTc 530->449  Stop compazine, add Zofran  Other Stroke Risk Factors  obesity  Migraines - on nortriptyline at home, doing well  Complicated migraine - left sided numbness with HA, much better, less frequent, on nortriptyline at home  Obstructive sleep apnea, on CPAP at home  Other Active Problems  Hypokalemia - supplement. K - 2.7 08/04/2015 despite supplementation - further supplementation ordered - > 3.6 today  Mild leukocytosis  CXR with low lung volumes  Oral thrush. Added nystatin x 7 days  Anixety. Resumed ativan  Other Pertinent History  GIB history  Hospital day # [place] for Pager information 08/06/2015 2:50 PM  I have personally examined this patient, reviewed notes, independently viewed imaging studies, participated in medical decision making and plan of care. I have made any additions or clarifications directly to the above note. Agree with note above. Medically ready for transfer to rehabilitation when bed available  Antony Contras, MD Medical Director [place] Pager: [phone] 08/06/2015 2:50 PM  To contact Stroke Continuity provider, please refer to [email or web address] After hours, contact General Neurology

## 2015-08-06 NOTE — Progress Notes (Signed)
Patient's sister, Patton Salles, notified of patient being transferred to 5M16.

## 2015-08-06 NOTE — Progress Notes (Addendum)
Report called to [place], RN on 36M. Patient to be transferred to 36M16 via bed. Belongings sent with patient. Leighana Neyman, Brewing technologist. NT transferred patient via bed.

## 2015-08-06 NOTE — Progress Notes (Addendum)
I have a denial for acute inpatient rehab admission for today.  Patient's needs can be met at a lower level of care, such as SNF.  Psychologist, occupational did speak with Dr. Leonie Man.  Recommend pursuit of SNF placement.  Call me for questions.  [phone]  Addendum:  I have called patient's sister and have informed her of denial.  She is aware that social worker will contact her for bed offers for SNF.  #[phone]

## 2015-08-06 NOTE — Care Management Note (Signed)
Case Management Note  Patient Details  Name: Destiny Robinson MRN: [id number] Date of Birth: 1956/02/23  Subjective/Objective:                 Pt admitted on 07/29/15 s/p fall with Rt basal ganglia hemorrhage.  Action/Plan: Insurance denial for CIR 08/06/15, plan SNF/rehab.  Expected Discharge Date:  08/07/15               Expected Discharge Plan:  SNF  In-House Referral:  Clinical Social Work  Discharge planning Services  CM Consult  Post Acute Care Choice:    Choice offered to:     DME Arranged:    DME Agency:     HH Arranged:    HH Agency:     Status of Service:  In process, will continue to follow  Medicare Important Message Given:   Date Medicare IM Given:    Medicare IM give by:    Date Additional Medicare IM Given:    Additional Medicare Important Message give by:     If discussed at [place] of Stay Meetings, dates discussed:    Additional Comments: Patton Salles (EHMCNO)[phone], [facility])  [phone]  Whitman [place], [place] [phone] 08/06/2015, 2:58 PM

## 2015-08-07 LAB — GLUCOSE, CAPILLARY
GLUCOSE-CAPILLARY: 218 mg/dL — AB (ref 65–99)
GLUCOSE-CAPILLARY: 193 mg/dL — AB (ref 65–99)
Glucose-Capillary: 166 mg/dL — ABNORMAL HIGH (ref 65–99)

## 2015-08-07 MED ORDER — POTASSIUM CHLORIDE CRYS ER 10 MEQ PO TBCR: 10.0000 meq | EXTENDED_RELEASE_TABLET | Freq: Two times a day (BID) | ORAL | Status: DC

## 2015-08-07 MED ORDER — HYDRALAZINE HCL 50 MG PO TABS: 50.0000 mg | ORAL_TABLET | Freq: Three times a day (TID) | ORAL | Status: AC

## 2015-08-07 MED ORDER — EZETIMIBE 10 MG PO TABS: 10.0000 mg | ORAL_TABLET | Freq: Every day | ORAL | Status: DC

## 2015-08-07 MED ORDER — LISINOPRIL 20 MG PO TABS: 20.0000 mg | ORAL_TABLET | Freq: Two times a day (BID) | ORAL | Status: DC

## 2015-08-07 MED ORDER — LORAZEPAM 0.5 MG PO TABS: 0.5000 mg | ORAL_TABLET | Freq: Four times a day (QID) | ORAL | Status: DC

## 2015-08-07 MED ORDER — LACRISERT 5 MG OP INST: 1.0000 | VAGINAL_INSERT | Freq: Three times a day (TID) | OPHTHALMIC | Status: DC

## 2015-08-07 MED ORDER — AMLODIPINE BESYLATE 10 MG PO TABS: 10.0000 mg | ORAL_TABLET | Freq: Every day | ORAL | Status: DC

## 2015-08-07 MED ORDER — LABETALOL HCL 100 MG PO TABS: 100.0000 mg | ORAL_TABLET | Freq: Three times a day (TID) | ORAL | Status: DC

## 2015-08-07 MED ORDER — SENNOSIDES-DOCUSATE SODIUM 8.6-50 MG PO TABS: 1.0000 | ORAL_TABLET | Freq: Two times a day (BID) | ORAL | Status: DC

## 2015-08-07 MED ORDER — PRAVASTATIN SODIUM 80 MG PO TABS: 80.0000 mg | ORAL_TABLET | Freq: Every day | ORAL | Status: AC

## 2015-08-07 MED ORDER — INSULIN DETEMIR 100 UNIT/ML ~~LOC~~ SOLN: 40.0000 [IU] | Freq: Every day | SUBCUTANEOUS | Status: DC

## 2015-08-07 NOTE — Progress Notes (Signed)
Patient is discharged from room 5M16 at this time. Alert and in stable condition. IV site d/c'd as well as tele. Report called in to receiving nurse Linwood Dibbles at [facility]. Patient transported out of unit via stretcher by PTAR with all belongings at side.

## 2015-08-07 NOTE — Care Management Note (Signed)
Case Management Note  Patient Details  Name: Destiny Robinson MRN: [id number] Date of Birth: 1956-05-28  Subjective/Objective:                    Action/Plan:Discharge to [facility] when medically ready. No CM needs at present.    Expected Discharge Date:  08/05/15               Expected Discharge Plan:  Skilled Nursing Facility  In-House Referral:  Clinical Social Work  Discharge planning Services  CM Consult  Post Acute Care Choice:    Choice offered to:     DME Arranged:    DME Agency:     HH Arranged:    [place] Agency:     Status of Service:  Completed, signed off  Medicare Important Message Given:  Yes-fourth notification given Date Medicare IM Given:    Medicare IM give by:    Date Additional Medicare IM Given:    Additional Medicare Important Message give by:     If discussed at [place] of Stay Meetings, dates discussed:    Additional Comments:  Delrae Sawyers, RN 08/07/2015, 2:18 PM

## 2015-08-07 NOTE — Progress Notes (Signed)
Stroke Discharge Summary  Patient ID: Destiny Robinson   MRN: [id number]      DOB: 1956-02-01  Date of Admission: 07/29/2015 Date of Discharge: 08/07/2015  Attending Physician:  Garvin Fila, MD, Stroke MD  Consulting Physician(s):      Delice Lesch, MD (Physical Medicine & Rehabtilitation)  Patient's PCP:  [name], MD  DISCHARGE DIAGNOSIS: Right BG bleeding with intraventricular extension - likely hypertensive  Cerebral edema, improved Diabetes Hypertensive Emergency Hyperlipidemia Nausea/Vomiting Obesity, Body mass index is 35.27 kg/(m^2).  Complicated migraine Obstructive sleep apnea, on CPAP  Hypokalemia - supplement. K - 2.7 08/04/2015 despite supplementation, rechecked 10/31 at 3.6  Mild leukocytosis, resolved to 8.9 CXR with low lung volumes Oral thrush. Added nystatin x 7 day Anixety. Resumed home dose ativan  Past Medical History  Diagnosis Date  . Hypertension   . Hypercholesterolemia   . Bronchitis   . Pancreatitis   . Complication of anesthesia 12/11/11    "angry, mean, hateful after" endoscopy & colonoscopy  . Asthma ~ 1991  . Pneumonia 06/2011; 07/2011  . Diabetes mellitus type 2, controlled ([place])   . GERD (gastroesophageal reflux disease)   . GI bleeding 12/21/11  . Hx of colonoscopy with polypectomy 12/11/11    "took out 7; couldn't get #8, that one was precancerous"  . Depression   . Cancer [facility])     "female parts; they got it all when I had hysterectomy"  . Paralysis gastric   . Kidney stones   . Sleep apnea     uses a cpap-oxygen at night   Past Surgical History  Procedure Laterality Date  . Cesarean section  1981; 1986  . Esophagogastroduodenoscopy  12/11/2011    Procedure: ESOPHAGOGASTRODUODENOSCOPY (EGD);  Surgeon: Missy Sabins, MD;  Location: [facility] ENDOSCOPY;  Service: Endoscopy;  Laterality: N/A;  . Colonoscopy  12/11/2011    Procedure: COLONOSCOPY;  Surgeon: Missy Sabins, MD;  Location: [place];  Service: Endoscopy;   Laterality: N/A;  . Abdominal hysterectomy  2002  . Cholecystectomy  ~ 2002  . Tonsillectomy and adenoidectomy  1988  . Appendectomy  ~ 2002    "w/hysterectomy"  . Colonoscopy  04/26/2012    Procedure: COLONOSCOPY;  Surgeon: Wonda Horner, MD;  Location: [facility] ENDOSCOPY;  Service: Endoscopy;  Laterality: N/A;  apc  . Hernia repair      Umbilical  . Open reduction internal fixation (orif) distal radial fracture Left 01/07/2015    Procedure: OPEN REDUCTION INTERNAL FIXATION (ORIF) LEFT DISTAL RADIAL FRACTURE;  Surgeon: Milly Jakob, MD;  Location: [place];  Service: Orthopedics;  Laterality: Left;  . Knee arthroscopy with medial menisectomy Right 03/29/2015    Procedure: ARTHROSCOPY KNEE, medial menisectomy, removal of loose body;  Surgeon: Frederik Pear, MD;  Location: [place];  Service: Orthopedics;  Laterality: Right;  RIGHT KNEE ARTHROSCOPY      Medication List    STOP taking these medications        ALIGN PO     clopidogrel 75 MG tablet  Commonly known as:  PLAVIX     glipiZIDE 5 MG 24 hr tablet  Commonly known as:  GLUCOTROL XL     hydrochlorothiazide 25 MG tablet  Commonly known as:  HYDRODIURIL     metFORMIN 500 MG tablet  Commonly known as:  GLUCOPHAGE     metoprolol tartrate 25 MG tablet  Commonly known as:  LOPRESSOR     niacin 250 MG tablet  nortriptyline 50 MG capsule  Commonly known as:  PAMELOR     NOVOLIN N 100 UNIT/ML injection  Generic drug:  insulin NPH Human     oxyCODONE-acetaminophen 5-325 MG tablet  Commonly known as:  ROXICET      TAKE these medications        acetaminophen 500 MG tablet  Commonly known as:  TYLENOL  Take 1,000 mg by mouth daily as needed for fever or headache (headache).     amLODipine 10 MG tablet  Commonly known as:  NORVASC  Take 1 tablet (10 mg total) by mouth daily.     calcium carbonate 600 MG Tabs tablet  Commonly known as:  OS-CAL  Take 600 mg by mouth daily.     Co Q-10 100  MG Caps  Take 100 mg by mouth at bedtime.     ezetimibe 10 MG tablet  Commonly known as:  ZETIA  Take 1 tablet (10 mg total) by mouth daily.     fluticasone 50 MCG/ACT nasal spray  Commonly known as:  FLONASE  Place 2 sprays into both nostrils daily as needed for allergies.     gabapentin 300 MG capsule  Commonly known as:  NEURONTIN  Take 2 capsules (600 mg total) by mouth 3 (three) times daily.     hydrALAZINE 50 MG tablet  Commonly known as:  APRESOLINE  Take 1 tablet (50 mg total) by mouth every 8 (eight) hours.     hydroxypropyl cellulose 5 MG Inst  Place 5 mg into both eyes 3 (three) times daily.     insulin detemir 100 UNIT/ML injection  Commonly known as:  LEVEMIR  Inject 0.4 mLs (40 Units total) into the skin daily.     labetalol 100 MG tablet  Commonly known as:  NORMODYNE  Take 1 tablet (100 mg total) by mouth 3 (three) times daily.     lisinopril 20 MG tablet  Commonly known as:  PRINIVIL,ZESTRIL  Take 1 tablet (20 mg total) by mouth 2 (two) times daily.     LORazepam 0.5 MG tablet  Commonly known as:  ATIVAN  Take 0.5 mg by mouth 4 (four) times daily. FOR ANXIETY     multivitamin with minerals Tabs tablet  Take 1 tablet by mouth daily.     potassium chloride 10 MEQ tablet  Commonly known as:  K-DUR,KLOR-CON  Take 1 tablet (10 mEq total) by mouth 2 (two) times daily.     pravastatin 80 MG tablet  Commonly known as:  PRAVACHOL  Take 1 tablet (80 mg total) by mouth daily.     PROAIR HFA 108 (90 BASE) MCG/ACT inhaler  Generic drug:  albuterol  USE 2 PUFFS EVERY 6 HOURS AS NEEDED FOR WHEEZING     senna-docusate 8.6-50 MG tablet  Commonly known as:  Senokot-S  Take 1 tablet by mouth 2 (two) times daily.     venlafaxine XR 75 MG 24 hr capsule  Commonly known as:  EFFEXOR-XR  Take 75 mg by mouth 3 (three) times daily.     VITAMIN D PO  Take 400 Units by mouth daily.        LABORATORY STUDIES CBG (last 3)   Recent Labs  08/06/15 2340  08/07/15 0650 08/07/15 1120  GLUCAP 201* 166* 218*   CBC    Component Value Date/Time   WBC 8.9 08/05/2015 0340   RBC 4.49 08/05/2015 0340   HGB 12.8 08/05/2015 0340   HCT 39.7 08/05/2015 0340   PLT  170 08/05/2015 0340   MCV 88.4 08/05/2015 0340   MCH 28.5 08/05/2015 0340   MCHC 32.2 08/05/2015 0340   RDW 13.8 08/05/2015 0340   LYMPHSABS 0.9 07/29/2015 0659   MONOABS 0.5 07/29/2015 0659   EOSABS 0.0 07/29/2015 0659   BASOSABS 0.0 07/29/2015 0659   CMP    Component Value Date/Time   NA 142 08/05/2015 0340   K 3.6 08/05/2015 0340   CL 105 08/05/2015 0340   CO2 28 08/05/2015 0340   GLUCOSE 179* 08/05/2015 0340   BUN 7 08/05/2015 0340   CREATININE 0.50 08/05/2015 0340   CALCIUM 9.2 08/05/2015 0340   PROT 6.6 08/05/2015 0340   ALBUMIN 2.9* 08/05/2015 0340   AST 21 08/05/2015 0340   ALT 17 08/05/2015 0340   ALKPHOS 50 08/05/2015 0340   BILITOT 0.4 08/05/2015 0340   GFRNONAA >60 08/05/2015 0340   GFRAA >60 08/05/2015 0340   COAGS Lab Results  Component Value Date   INR 1.05 07/29/2015   INR 0.96 12/21/2011   Lipid Panel    Component Value Date/Time   CHOL 237* 07/30/2015 0211   TRIG 405* 07/30/2015 0211   HDL 47 07/30/2015 0211   CHOLHDL 5.0 07/30/2015 0211   VLDL UNABLE TO CALCULATE IF TRIGLYCERIDE OVER 400 mg/dL 07/30/2015 0211   LDLCALC UNABLE TO CALCULATE IF TRIGLYCERIDE OVER 400 mg/dL 07/30/2015 0211   HgbA1C  Lab Results  Component Value Date   HGBA1C 6.7* 07/30/2015   Cardiac Panel (last 3 results) No results for input(s): CKTOTAL, CKMB, TROPONINI, RELINDX in the last 72 hours. Urinalysis    Component Value Date/Time   COLORURINE YELLOW 07/29/2015 0930   APPEARANCEUR CLEAR 07/29/2015 0930   LABSPEC 1.027 07/29/2015 0930   PHURINE 6.5 07/29/2015 0930   GLUCOSEU >1000* 07/29/2015 0930   HGBUR SMALL* 07/29/2015 0930   BILIRUBINUR NEGATIVE 07/29/2015 0930   KETONESUR 15* 07/29/2015 0930   PROTEINUR >300* 07/29/2015 0930   UROBILINOGEN 0.2  07/29/2015 0930   NITRITE NEGATIVE 07/29/2015 0930   LEUKOCYTESUR NEGATIVE 07/29/2015 0930   Urine Drug Screen     Component Value Date/Time   LABOPIA NONE DETECTED 07/29/2015 0930   COCAINSCRNUR NONE DETECTED 07/29/2015 0930   LABBENZ POSITIVE* 07/29/2015 0930   AMPHETMU NONE DETECTED 07/29/2015 0930   THCU NONE DETECTED 07/29/2015 0930   LABBARB NONE DETECTED 07/29/2015 0930    Alcohol Level    Component Value Date/Time   ETH <5 07/29/2015 0659     SIGNIFICANT DIAGNOSTIC STUDIES  Ct Angio Head and neck W/cm &/or Wo Cm Ct Head Wo Contrast 07/31/2015  IMPRESSION: 1. No significant interval change in the size of a right thalamic hemorrhage with extension into the brainstem. 2. Surrounding edema and midline shift is stable. 3. Slight increase in interventricular hemorrhage with layering blood products. There is no hydrocephalus. 4. Hypoattenuation in the brainstem is likely artifactual, appearance similar to the prior exam. 5. Stable atrophy and white matter disease otherwise.   07/30/2015  IMPRESSION: CT head: Evolving RIGHT thalamic intraparenchymal hematoma, no suspicious enhancement. CTA neck: No acute vascular or hemodynamically significant stenosis. Tortuous vessels compatible with chronic hypertension. CTA head: No acute large vessel occlusion Moderate to high-grade stenosis RIGHT P2 segment (likely thromboembolic, less likely focal vasospasm). Dolicoectatic intracranial vessels compatible with chronic hypertension.   07/29/2015  IMPRESSION: 1. Right thalamic hemorrhage with 6 mm of midline shift from right to left. 2. Intraventricular hemorrhage. 3. Old left basal ganglia lacunar infarct. 4. 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     Ct Cervical Spine Wo Contrast 07/29/2015  IMPRESSION: 1. No maxillofacial fractures. 2. No cervical spine fracture or subluxation. 3. Cervical spine degenerative changes. 4. Mild bilateral carotid artery  atheromatous calcifications.   DG Chest Port 1 View 08/04/2015 Low lung volumes.  Carotid Doppler Bilateral: 1-39% ICA stenosis. Vertebral artery flow is antegrade.  2D Echocardiogram  - Left ventricle: The cavity size was normal. There was mildconcentric hypertrophy. Systolic function was normal. Theestimated ejection fraction was in the range of 60% to 65%. Wallmotion was normal; there were no regional wall motionabnormalities. There was an increased relative contribution ofatrial contraction to ventricular filling. Doppler parameters areconsistent with abnormal left ventricular relaxation (grade 1diastolic dysfunction). - Aortic valve: Mildly to moderately calcified annulus. Trileaflet;mildly thickened, mildly calcified leaflets. There was trivialregurgitation. - Mitral valve: Calcified annulus. Minimal diffuse calcification ofthe anterior leaflet and posterior leaflet. - Atrial septum: There was increased thickness of the septum,consistent with lipomatous hypertrophy. - Pulmonary arteries: PA peak pressure: 32 mm Hg (S).  FEES  07/31/2015 Pt has a mild-moderate oropharyngeal dysphagia secondary to left-sided weakness. Lingual manipulation is weak with slow transit, leading to decreased bolus cohesion for pharyngeal transfer. Pharyngeal weakness results in mild residuals that remain with all consistencies tested. Despite weakness and premature spill, there is no airway compromise observed. Recommend to resume thin liquid (clear liquid) diet. Pt would be appropriate for advancement to Dys 1 diet and thin liquids as nausea improves.     HISTORY OF PRESENT ILLNESS  Destiny Robinson is an 59 y.o. female with a history of multiple medical problems who reports awakening this morning and attempting to get out of bed. Patient fell and hit the nightstand. Was unable get off the floor. EMS was called and patient was brought in as a code stroke. Initial NIHSS of 19. She was last known  well 07/28/2015 at 22:00. CT showed R thalamic hemorrhage. She was admitted for further evaluation and treatment.    HOSPITAL COURSE Ms. KAINAT PIZANA is a 59 y.o. female with history of HTN, HLD, DM, diabetic gastroparesis, GIB, obesity, OSA on CPAP admitted for acute onset left weakness causing fall. Symptoms unchanged.   Right BG bleeding with intraventricular extension - likely hypertensive   Stable ICH and no hydrocephalus  Resultant - left hemiplegia, dysarthria, left neglect  CT showed right BG ICH with IVH  CT repeat showed stable ICH and no hydrocephalus  CTA head and neck - right P2 stenosis but otherwise unremarkable  Carotid Doppler unremarkable  2D Echo unremarkable  LDL unable to calculate due to TG 405  HgbA1c 6.7  clopidogrel 75 mg daily prior to admission, now on No antithrombotic due to [place]. Consider resuming low dose aspirin in 1 month  Therapy recommendations: CIR. After consultation between Dr. Leonie Man and medical director at [facility], it was felt that pt's needs could be best met at a lower level of care.   Disposition: discharge to SNF for ongoing PT, OT and ST  Cerebral edema, resolved  Midline shift  Na 142->141->139  No neuro change  stable  Diabetes  HgbA1c 6.7, at goal < 7.0  Uncontrolled  CBG monitoring showed improving hyperglycemia  increased levemir to 40 units   Discharge on insulin  Hypertensive Emergency  Home meds: Amlodipine, HCTZ and metoprolol  Initially on Cleviprex  Put back on amlodipine 10  Home Metoprolol 50 bid changed to labetolol 50   continue lisinopril 20 for better BP control  Add hydralazine for better BP control  Patient counseled to be compliant with her blood pressure medications  BP goal <130/80  Hyperlipidemia  Home meds: pravastatin   LDL not able to calculate, goal < 70  Added zetia due to high TG  Continue pravastatin and zetia at  discharge  Nausea/Vomiting  Hx of diabetic gastropresis  QTc 530->449  Stop compazine, add Zofran  Other Stroke Risk Factors  Obesity, Body mass index is 35.27 kg/(m^2).   Complicated migraine - left sided numbness with HA, much better, less frequent, on nortriptyline at home, followed by dr. XU in the office prior to admission. Past stroke workup neg  Obstructive sleep apnea, on CPAP at home  Other Active Problems  Hypokalemia - supplement. K - 2.7 08/04/2015 despite supplementation, rechecked 10/31 at 3.6   Mild leukocytosis, resolved to 8.9  CXR with low lung volumes  Oral thrush. Added nystatin x 7 days  Anixety. Resumed home dose ativan  Other Pertinent History  GIB history   DISCHARGE EXAM Blood pressure 180/87, pulse 120, temperature 99.1 F (37.3 C), temperature source Oral, resp. rate 16, height 5' 2" (1.575 m), weight 87.5 kg (192 lb 14.4 oz), SpO2 99 %. General - Well nourished, well developed,  HEENT: NCAT; pupils reactive; sclera clear; moist mucous membranes Cardiovascular - Regular rate and rhythm. Pulmonary: CTA GI: Abdomen soft, NT ND Extrem: No C/C/E  Mental Status -  Awake alert and interactive Level of arousal and orientation to time, place, and person were intact. Language including expression, naming, repetition, comprehension was assessed and found intact, but mild dysarthria and mild left hemi-neglect. Cranial Nerves II - XII - II - not blinking to visual threat on the left. Pupils resonsive III, IV, VI - Extraocular movements intact, able to have incomplete left gaze, but right eye gaze preference. V - Facial sensation intact bilaterally. VII - left facial droop. VIII - Hearing & vestibular intact bilaterally. X - Palate elevates symmetrically, dysarthria. XI - shoulder shrug decreased on the left. XII - Tongue protrusion intact.  Motor Strength - The patient's strength was 0/5 LUE and LLE, 5/5 RUE and RLE. Bulk was normal and  fasciculations were absent.   Reflexes - The patient's reflexes were symmetrical in all extremities and she had no pathological reflexes.  Sensory - Light touch, temperature/pinprick were assessed; she seems to feel less on the left; however, she reports no difference left hemi-neglect  Coordination - The patient had difficulty with detailed instruction  Gait and Station - not tested due to weakness.  Discharge Diet   DIET - DYS 1 Room service appropriate?: No; Fluid consistency:: Thin liquids  DISCHARGE PLAN  Disposition:  Discharge to skilled nursing facility for ongoing PT, OT and ST.    Consider low dose aspirin in month for stroke prevention  Follow-up NYLAND,LEONARD ROBERT, MD in 2 weeks.  Follow-up with Dr. Jindong Xu, Stroke Clinic in 2 months.  45 minutes were spent preparing discharge.  BIBY,SHARON  [facility] See Amion for Pager information 08/07/2015 3:33 PM   I have personally examined this patient, reviewed notes, independently viewed imaging studies, participated in medical decision making and plan of care. I have made any additions or clarifications directly to the above note. Agree with note above.    , MD Medical Director [facility] Pager: [phone] 08/07/2015 4:50 PM  

## 2015-08-07 NOTE — Clinical Social Work Note (Signed)
CSW confirmed with Crystal from [facility] that they can offer the pt a bed. CSW informed Santiago Glad the pt's sister.   [place], MSW, [place]

## 2015-08-07 NOTE — Care Management Important Message (Signed)
Important Message  Patient Details  Name: Destiny Robinson MRN: [id number] Date of Birth: September 22, 1956   Medicare Important Message Given:  Yes-fourth notification given    Nathen May 08/07/2015, 11:41 AM

## 2015-08-07 NOTE — NC FL2 (Signed)
[place] LEVEL OF CARE SCREENING TOOL     IDENTIFICATION  Patient Name: Destiny Robinson Birthdate: [birth year] Sex: female Admission Date (Current Location): 07/29/2015  [facility] and [place] Number:     Facility and Address:  [facility], [place] [address] [place], [place] [address]      Provider Number: [id number]  Attending Physician Name and Address:  Garvin Fila, MD  Relative Name and Phone Number:       Current Level of Care: Hospital Recommended Level of Care: [place] Prior Approval Number:    Date Approved/Denied:   PASRR Number: [id number]  [facility] [phone])  Discharge Plan: SNF    Current Diagnoses: Patient Active Problem List   Diagnosis Date Noted  . Cough   . Malignant hypertension   . ICH (intracerebral hemorrhage) ([place]) 07/29/2015  . Migraine with aura and without status migrainosus, not intractable 04/15/2015  . Cephalalgia 12/11/2014  . Type 2 diabetes mellitus with other circulatory complications ([place]) 62/69/4854  . Headache 10/30/2014  . Left sided numbness 10/30/2014  . HLD (hyperlipidemia) 10/30/2014  . OSA on CPAP 10/30/2014  . Obesity 10/30/2014  . Essential hypertension   . Left hip pain   . Sensory disturbance 10/02/2014  . Morbid obesity ([place]) 10/02/2014  . Cerebrovascular accident ([place]) 10/01/2014  . CVA (cerebral infarction) 10/01/2014  . Acute bronchitis 05/14/2013  . GERD (gastroesophageal reflux disease) 04/22/2013  . Obstructive sleep apnea 11/19/2012  . Dyspnea on exertion 11/19/2012  . Abdominal pain 12/21/2011  . GIB (gastrointestinal bleeding) 12/21/2011  . Soft tissue infection 12/21/2011  . Depression 12/11/2011  . Nausea, vomiting and diarrhea 12/10/2011  . HTN (hypertension) 12/10/2011  . Diabetes mellitus, type 2 ([place]) 12/10/2011  . Pancreatitis     Orientation ACTIVITIES/SOCIAL BLADDER RESPIRATION    Self, Situation  Family supportive Incontinent O2 (As  needed)  BEHAVIORAL SYMPTOMS/MOOD NEUROLOGICAL BOWEL NUTRITION STATUS      Incontinent Diet (DYS 1)  PHYSICIAN VISITS COMMUNICATION OF NEEDS Height & Weight Skin    Verbally 5\' 2"  (157.5 cm) 192 lbs. Normal          AMBULATORY STATUS RESPIRATION    Assist extensive O2 (As needed)      Personal Care Assistance Level of Assistance  Dressing, Feeding, Bathing Bathing Assistance: Maximum assistance Feeding assistance: Limited assistance Dressing Assistance: Maximum assistance      Functional Limitations Info                [place]  PT (By licensed PT), OT (By licensed OT), Speech therapy                   Additional Factors Info  Code Status, Allergies Code Status Info: FULL CODE  Allergies Info: Codeine, Penicillins, Reglan, Xifaxan, Zocor, Crestor           Current Medications (08/07/2015): Current Facility-Administered Medications  Medication Dose Route Frequency Provider Last Rate Last Dose  . 0.9 %  sodium chloride infusion   Intravenous Continuous Rosalin Hawking, MD 50 mL/hr at 08/06/15 1233    . acetaminophen (TYLENOL) tablet 650 mg  650 mg Oral Q4H PRN Alexis Goodell, MD   650 mg at 08/06/15 1650   Or  . acetaminophen (TYLENOL) suppository 650 mg  650 mg Rectal Q4H PRN Alexis Goodell, MD   650 mg at 08/01/15 0355  . amLODipine (NORVASC) tablet 10 mg  10 mg Oral Daily Rosalin Hawking, MD   10  mg at 08/06/15 1004  . antiseptic oral rinse (CPC / CETYLPYRIDINIUM CHLORIDE 0.05%) solution 7 mL  7 mL Mouth Rinse BID Garvin Fila, MD   7 mL at 08/06/15 2254  . chlorhexidine (PERIDEX) 0.12 % solution 15 mL  15 mL Mouth Rinse BID Rosalin Hawking, MD   15 mL at 08/06/15 2254  . ezetimibe (ZETIA) tablet 10 mg  10 mg Oral Daily Rosalin Hawking, MD   10 mg at 08/06/15 1004  . heparin injection 5,000 Units  5,000 Units Subcutaneous 3 times per day Rosalin Hawking, MD   5,000 Units at 08/07/15 0602  . hydrALAZINE (APRESOLINE) injection 10 mg  10 mg Intravenous Q4H PRN  Rosalin Hawking, MD   10 mg at 08/02/15 2101  . hydrALAZINE (APRESOLINE) tablet 50 mg  50 mg Oral 3 times per day Rosalin Hawking, MD   50 mg at 08/07/15 0602  . insulin aspart (novoLOG) injection 0-15 Units  0-15 Units Subcutaneous TID WC Alexis Goodell, MD   3 Units at 08/07/15 [phone]  . insulin detemir (LEVEMIR) injection 40 Units  40 Units Subcutaneous Daily Rosalin Hawking, MD   40 Units at 08/06/15 1004  . labetalol (NORMODYNE) tablet 100 mg  100 mg Oral TID Catha Gosselin, MD   100 mg at 08/06/15 2254  . labetalol (NORMODYNE,TRANDATE) injection 10-40 mg  10-40 mg Intravenous Q10 min PRN Rosalin Hawking, MD   20 mg at 07/29/15 2245  . lisinopril (PRINIVIL,ZESTRIL) tablet 20 mg  20 mg Oral BID Rosalin Hawking, MD   20 mg at 08/06/15 2254  . LORazepam (ATIVAN) tablet 0.5 mg  0.5 mg Oral QID Donzetta Starch, NP   0.5 mg at 08/06/15 2254  . ondansetron (ZOFRAN) injection 4 mg  4 mg Intravenous Q6H PRN Donzetta Starch, NP   4 mg at 08/05/15 1145  . pantoprazole (PROTONIX) EC tablet 40 mg  40 mg Oral Daily Rosalin Hawking, MD   40 mg at 08/06/15 1004  . polyvinyl alcohol (LIQUIFILM TEARS) 1.4 % ophthalmic solution 1 drop  1 drop Both Eyes TID Donzetta Starch, NP   1 drop at 08/06/15 2254  . potassium chloride (K-DUR,KLOR-CON) CR tablet 10 mEq  10 mEq Oral BID Catha Gosselin, MD   10 mEq at 08/06/15 2253  . pravastatin (PRAVACHOL) tablet 80 mg  80 mg Oral Daily Rosalin Hawking, MD   80 mg at 08/06/15 1004  . promethazine (PHENERGAN) tablet 25 mg  25 mg Oral Q6H PRN Alexis Goodell, MD   25 mg at 08/03/15 2229  . senna-docusate (Senokot-S) tablet 1 tablet  1 tablet Oral BID Alexis Goodell, MD   1 tablet at 08/06/15 2254  . traMADol (ULTRAM) tablet 100 mg  100 mg Oral TID PRN Catha Gosselin, MD   100 mg at 08/06/15 1755   Do not use this list as official medication orders. Please verify with discharge summary.  Discharge Medications:   Medication List    ASK your doctor about these medications        acetaminophen 500 MG tablet   Commonly known as:  TYLENOL  Take 1,000 mg by mouth daily as needed for fever or headache (headache).     ALIGN PO  Take 1 capsule by mouth daily.     amLODipine 5 MG tablet  Commonly known as:  NORVASC  Take 1 tablet (5 mg total) by mouth daily.     calcium carbonate 600 MG Tabs tablet  Commonly known as:  OS-CAL  Take 600 mg by mouth daily.     clopidogrel 75 MG tablet  Commonly known as:  PLAVIX  Take 1 tablet (75 mg total) by mouth daily.     Co Q-10 100 MG Caps  Take 100 mg by mouth at bedtime.     fluticasone 50 MCG/ACT nasal spray  Commonly known as:  FLONASE  Place 2 sprays into both nostrils daily as needed for allergies.     gabapentin 300 MG capsule  Commonly known as:  NEURONTIN  Take 2 capsules (600 mg total) by mouth 3 (three) times daily.     glipiZIDE 5 MG 24 hr tablet  Commonly known as:  GLUCOTROL XL  Take 5 mg by mouth daily.     hydrochlorothiazide 25 MG tablet  Commonly known as:  HYDRODIURIL  Take 25 mg by mouth daily.     LORazepam 0.5 MG tablet  Commonly known as:  ATIVAN  Take 0.5 mg by mouth 4 (four) times daily. FOR ANXIETY     metFORMIN 500 MG tablet  Commonly known as:  GLUCOPHAGE  Take 500 mg by mouth 2 (two) times daily with a meal.     metoprolol tartrate 25 MG tablet  Commonly known as:  LOPRESSOR  Take 1 tablet (25 mg total) by mouth 2 (two) times daily.     multivitamin with minerals Tabs tablet  Take 1 tablet by mouth daily.     niacin 250 MG tablet  Take 250 mg by mouth.     nortriptyline 50 MG capsule  Commonly known as:  PAMELOR  Take 1 capsule (50 mg total) by mouth at bedtime.     NOVOLIN N 100 UNIT/ML injection  Generic drug:  insulin NPH Human  Inject 44 Units into the skin.     oxyCODONE-acetaminophen 5-325 MG tablet  Commonly known as:  ROXICET  Take 1 tablet by mouth every 6 (six) hours as needed.     pravastatin 80 MG tablet  Commonly known as:  PRAVACHOL  Take 80 mg by mouth daily.     PROAIR HFA  108 (90 BASE) MCG/ACT inhaler  Generic drug:  albuterol  USE 2 PUFFS EVERY 6 HOURS AS NEEDED FOR WHEEZING     venlafaxine XR 75 MG 24 hr capsule  Commonly known as:  EFFEXOR-XR  Take 75 mg by mouth 3 (three) times daily.     VITAMIN D PO  Take 400 Units by mouth daily.        Relevant Imaging Results:  Relevant Lab Results:  Recent Labs    Additional Information    Bibbs, Dysheka, LCSW    Antony Contras, MD Medical Director [facility] Pager: [phone] 08/07/2015 4:49 PM

## 2015-08-07 NOTE — Clinical Social Work Placement (Addendum)
   CLINICAL SOCIAL WORK PLACEMENT  NOTE  Date:  08/07/2015  Patient Details  Name: Destiny Robinson MRN: [id number] Date of Birth: 24-Nov-1955  Clinical Social Work is seeking post-discharge placement for this patient at the [place] level of care (*CSW will initial, date and re-position this form in  chart as items are completed):  Yes   Patient/family provided with [place] Work Department's list of facilities offering this level of care within the geographic area requested by the patient (or if unable, by the patient's family).  Yes   Patient/family informed of their freedom to choose among providers that offer the needed level of care, that participate in Medicare, Medicaid or managed care program needed by the patient, have an available bed and are willing to accept the patient.  Yes   Patient/family informed of [place]'s ownership interest in [facility] and [facility], as well as of the fact that they are under no obligation to receive care at these facilities.  PASRR submitted to EDS on 08/07/15     PASRR number received on 08/07/15     Existing PASRR number confirmed on       FL2 transmitted to all facilities in geographic area requested by pt/family on 08/07/15     FL2 transmitted to all facilities within larger geographic area on       Patient informed that his/her managed care company has contracts with or will negotiate with certain facilities, including the following:        Yes   Patient/family informed of bed offers received.  Patient [facility] bed at [facility]     Physician recommends and patient chooses bed at      Patient to be transferred to [facility] on 08/07/15.  Patient to be transferred to facility by [facility]      Patient family notified on 08/07/15 of transfer.  Name of family member notified:  Patton Salles, daughter      PHYSICIAN       Additional Comment:     _______________________________________________ Greta Doom, LCSW 08/07/2015, 3:57 PM

## 2015-08-07 NOTE — Progress Notes (Signed)
Speech Language Pathology Treatment: Dysphagia  Patient Details Name: Destiny Robinson MRN: [id number] DOB: Jan 19, 1956 Today's Date: 08/07/2015 Time: 2620-3559 SLP Time Calculation (min) (ACUTE ONLY): 22 min  Assessment / Plan / Recommendation Clinical Impression  Pt today is sleepy but did awaken to SLP maximum verbal/visual cues.  She required total tactile cues to attend to neglected side. Pt did follow a few one step commands with max verbal/visual cues with 50% accuracy.    SLP provided oral care to pt using suction, toothette, toothbrush/paste.  Upon oral suctioning, orange tinged exudate removed - ? Residual of po or crushed pill?  Pt also with mild amount of white coating on right side of tongue only - per RN pt has oral candidiasis.  RN also reports she was informed that pt pockets food.     Skilled intervention including SLP updating strategies to maximize airway protection.  If pt remains lethargic and is orally pocketing foods excessively, adequate po intake will be challenging for her to achieve.  If nutrition consult not established, SLP would recommend referral.  SLP to follow for dysphagia/cognitive linguistic treatment.    HPI Other Pertinent Information: 59 y.o. female presenting with acute onset left sided weakness. 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  Pt on dys1/thin diet and SLP follow up for cognitive linguistic and dysphagia treatment indicated.     Pertinent Vitals Pain Assessment: Faces Faces Pain Scale: No hurt  SLP Plan  Continue with current plan of care    Recommendations Diet recommendations: Dysphagia 1 (puree);Thin liquid Liquids provided via: Cup;No straw (use tsps if needed) Medication Administration: Crushed with puree (assure pt swallows before giving more ) Supervision: Full supervision/cueing for compensatory strategies Compensations: Small sips/bites;Slow  rate;Other (Comment) (oral suction after ALL po intake due to pt pocketing) Postural Changes and/or Swallow Maneuvers: Seated upright 90 degrees;Upright 30-60 min after meal              Follow up Recommendations: Skilled Nursing facility Plan: Continue with current plan of care    [place], [place] [facility] SLP [phone]

## 2015-08-07 NOTE — Clinical Social Work Note (Signed)
Clinical Social Work Assessment  Patient Details  Name: Destiny Robinson MRN: [id number] Date of Birth: 10-23-1955  Date of referral:  08/07/15               Reason for consult:  Facility Placement                Permission sought to share information with:  Family Supports Permission granted to share information::     Name::     Whitaker,Karen  Relationship::  sister    Housing/Transportation Living arrangements for the past 2 months:  Single Family Home Source of Information:   (sister ) Patient Interpreter Needed:  None Criminal Activity/Legal Involvement Pertinent to Current Situation/Hospitalization:  No - Comment as needed Significant Relationships:  Other Family Members Lives with:  Self Do you feel safe going back to the place where you live?  No Need for family participation in patient care:  Yes (Comment)  Care giving concerns:  None   Social Worker assessment / plan: CSW spoke with pt's sister Patton Salles. CSW introduced self and purpose of the call. CSW discussed SNF rehab. CSW explained insurance and its relation to SNF placement. Santiago Glad prefers the pt to go to [facility]. CSW answered all questions in which Santiago Glad inquired about. CSW will continue to follow this pt and assist with discharge as needed.  Employment status:  Disabled (Comment on whether or not currently receiving Disability) Insurance information:   Managed Medicare  PT Recommendations:  [place] / Referral to community resources:  [place]  Patient/Family's Response to care: Santiago Glad reported that the care in which the pt received has been well.   Patient/Family's Understanding of and Emotional Response to Diagnosis, Current Treatment, and Prognosis: Santiago Glad reported understanding the pt's diagnosis and current treatment. Santiago Glad reported that she will remain content as long as the pt is well cared for.   Emotional Assessment Appearance:    (Unable to Assess ) Attitude/Demeanor/Rapport:  Unable to Assess Affect (typically observed):  Unable to Assess Orientation:  Oriented to Self, Oriented to Situation Alcohol / Substance use:  Not Applicable Psych involvement (Current and /or in the community):  No (Comment)  Discharge Needs  Concerns to be addressed:  Denies Needs/Concerns at this time Readmission within the last 30 days:  No Current discharge risk:  None Barriers to Discharge:  No Barriers Identified   Destiny Roma, LCSW 08/07/2015, 8:49 AM

## 2015-08-08 ENCOUNTER — Emergency Department (HOSPITAL_COMMUNITY): Payer: Medicare Other

## 2015-08-08 ENCOUNTER — Inpatient Hospital Stay (HOSPITAL_COMMUNITY)
Admission: EM | Admit: 2015-08-08 | Discharge: 2015-08-10 | DRG: 689 | Disposition: A | Discharge status: Skilled Nursing Facility | Payer: Medicare Other | Attending: Internal Medicine | Admitting: Internal Medicine

## 2015-08-08 ENCOUNTER — Encounter (HOSPITAL_COMMUNITY): Payer: Self-pay

## 2015-08-08 ENCOUNTER — Inpatient Hospital Stay (HOSPITAL_COMMUNITY): Payer: Medicare Other

## 2015-08-08 DIAGNOSIS — Z880 Allergy status to penicillin: Secondary | ICD-10-CM

## 2015-08-08 DIAGNOSIS — R05 Cough: Secondary | ICD-10-CM | POA: Diagnosis not present

## 2015-08-08 DIAGNOSIS — R4182 Altered mental status, unspecified: Secondary | ICD-10-CM | POA: Diagnosis not present

## 2015-08-08 DIAGNOSIS — Z90710 Acquired absence of both cervix and uterus: Secondary | ICD-10-CM

## 2015-08-08 DIAGNOSIS — Z87891 Personal history of nicotine dependence: Secondary | ICD-10-CM | POA: Diagnosis not present

## 2015-08-08 DIAGNOSIS — J45909 Unspecified asthma, uncomplicated: Secondary | ICD-10-CM | POA: Diagnosis present

## 2015-08-08 DIAGNOSIS — F329 Major depressive disorder, single episode, unspecified: Secondary | ICD-10-CM | POA: Diagnosis present

## 2015-08-08 DIAGNOSIS — E7800 Pure hypercholesterolemia, unspecified: Secondary | ICD-10-CM | POA: Diagnosis present

## 2015-08-08 DIAGNOSIS — Z7984 Long term (current) use of oral hypoglycemic drugs: Secondary | ICD-10-CM

## 2015-08-08 DIAGNOSIS — K219 Gastro-esophageal reflux disease without esophagitis: Secondary | ICD-10-CM | POA: Diagnosis present

## 2015-08-08 DIAGNOSIS — Z888 Allergy status to other drugs, medicaments and biological substances status: Secondary | ICD-10-CM

## 2015-08-08 DIAGNOSIS — R410 Disorientation, unspecified: Secondary | ICD-10-CM | POA: Diagnosis not present

## 2015-08-08 DIAGNOSIS — R29810 Facial weakness: Secondary | ICD-10-CM | POA: Diagnosis present

## 2015-08-08 DIAGNOSIS — I69154 Hemiplegia and hemiparesis following nontraumatic intracerebral hemorrhage affecting left non-dominant side: Secondary | ICD-10-CM | POA: Diagnosis not present

## 2015-08-08 DIAGNOSIS — I610 Nontraumatic intracerebral hemorrhage in hemisphere, subcortical: Secondary | ICD-10-CM

## 2015-08-08 DIAGNOSIS — I639 Cerebral infarction, unspecified: Secondary | ICD-10-CM | POA: Diagnosis present

## 2015-08-08 DIAGNOSIS — G4730 Sleep apnea, unspecified: Secondary | ICD-10-CM | POA: Diagnosis present

## 2015-08-08 DIAGNOSIS — E876 Hypokalemia: Secondary | ICD-10-CM | POA: Diagnosis present

## 2015-08-08 DIAGNOSIS — N390 Urinary tract infection, site not specified: Secondary | ICD-10-CM | POA: Diagnosis not present

## 2015-08-08 DIAGNOSIS — Z885 Allergy status to narcotic agent status: Secondary | ICD-10-CM

## 2015-08-08 DIAGNOSIS — R509 Fever, unspecified: Secondary | ICD-10-CM | POA: Diagnosis not present

## 2015-08-08 DIAGNOSIS — G9340 Encephalopathy, unspecified: Secondary | ICD-10-CM | POA: Diagnosis present

## 2015-08-08 DIAGNOSIS — R4701 Aphasia: Secondary | ICD-10-CM | POA: Diagnosis present

## 2015-08-08 DIAGNOSIS — I615 Nontraumatic intracerebral hemorrhage, intraventricular: Secondary | ICD-10-CM | POA: Diagnosis present

## 2015-08-08 DIAGNOSIS — Z8679 Personal history of other diseases of the circulatory system: Secondary | ICD-10-CM

## 2015-08-08 DIAGNOSIS — Z79899 Other long term (current) drug therapy: Secondary | ICD-10-CM

## 2015-08-08 DIAGNOSIS — E1159 Type 2 diabetes mellitus with other circulatory complications: Secondary | ICD-10-CM | POA: Diagnosis not present

## 2015-08-08 DIAGNOSIS — I10 Essential (primary) hypertension: Secondary | ICD-10-CM | POA: Diagnosis present

## 2015-08-08 DIAGNOSIS — Z9049 Acquired absence of other specified parts of digestive tract: Secondary | ICD-10-CM

## 2015-08-08 DIAGNOSIS — E1151 Type 2 diabetes mellitus with diabetic peripheral angiopathy without gangrene: Secondary | ICD-10-CM | POA: Diagnosis present

## 2015-08-08 DIAGNOSIS — E785 Hyperlipidemia, unspecified: Secondary | ICD-10-CM | POA: Diagnosis present

## 2015-08-08 HISTORY — DX: Cerebral infarction, unspecified: I63.9

## 2015-08-08 LAB — COMPREHENSIVE METABOLIC PANEL
ALT: 18 U/L (ref 14–54)
ALT: 19 U/L (ref 14–54)
ANION GAP: 8 (ref 5–15)
AST: 22 U/L (ref 15–41)
AST: 23 U/L (ref 15–41)
Albumin: 3.3 g/dL — ABNORMAL LOW (ref 3.5–5.0)
Albumin: 2.9 g/dL — ABNORMAL LOW (ref 3.5–5.0)
Alkaline Phosphatase: 53 U/L (ref 38–126)
Alkaline Phosphatase: 50 U/L (ref 38–126)
Anion gap: 16 — ABNORMAL HIGH (ref 5–15)
BILIRUBIN TOTAL: 0.5 mg/dL (ref 0.3–1.2)
BUN: 14 mg/dL (ref 6–20)
BUN: 8 mg/dL (ref 6–20)
CHLORIDE: 103 mmol/L (ref 101–111)
CO2: 21 mmol/L — ABNORMAL LOW (ref 22–32)
CO2: 29 mmol/L (ref 22–32)
CREATININE: 0.45 mg/dL (ref 0.44–1.00)
CREATININE: 0.57 mg/dL (ref 0.44–1.00)
Calcium: 9.5 mg/dL (ref 8.9–10.3)
Calcium: 9 mg/dL (ref 8.9–10.3)
Chloride: 105 mmol/L (ref 101–111)
Glucose, Bld: 181 mg/dL — ABNORMAL HIGH (ref 65–99)
Glucose, Bld: 165 mg/dL — ABNORMAL HIGH (ref 65–99)
POTASSIUM: 3.4 mmol/L — AB (ref 3.5–5.1)
POTASSIUM: 3.7 mmol/L (ref 3.5–5.1)
SODIUM: 140 mmol/L (ref 135–145)
Sodium: 142 mmol/L (ref 135–145)
TOTAL PROTEIN: 7.2 g/dL (ref 6.5–8.1)
Total Bilirubin: 0.8 mg/dL (ref 0.3–1.2)
Total Protein: 6.6 g/dL (ref 6.5–8.1)

## 2015-08-08 LAB — CBC WITH DIFFERENTIAL/PLATELET
BASOS ABS: 0.1 10*3/uL (ref 0.0–0.1)
Basophils Absolute: 0 10*3/uL (ref 0.0–0.1)
Basophils Relative: 1 %
Basophils Relative: 1 %
EOS ABS: 0.1 10*3/uL (ref 0.0–0.7)
EOS PCT: 1 %
Eosinophils Absolute: 0.1 10*3/uL (ref 0.0–0.7)
Eosinophils Relative: 1 %
HCT: 40.2 % (ref 36.0–46.0)
HEMATOCRIT: 39 % (ref 36.0–46.0)
HEMOGLOBIN: 12.6 g/dL (ref 12.0–15.0)
Hemoglobin: 13 g/dL (ref 12.0–15.0)
LYMPHS ABS: 2.4 10*3/uL (ref 0.7–4.0)
LYMPHS PCT: 21 %
LYMPHS PCT: 28 %
Lymphs Abs: 2 10*3/uL (ref 0.7–4.0)
MCH: 28.6 pg (ref 26.0–34.0)
MCH: 28.8 pg (ref 26.0–34.0)
MCHC: 32.3 g/dL (ref 30.0–36.0)
MCHC: 32.3 g/dL (ref 30.0–36.0)
MCV: 88.4 fL (ref 78.0–100.0)
MCV: 89 fL (ref 78.0–100.0)
MONOS PCT: 7 %
Monocytes Absolute: 0.7 10*3/uL (ref 0.1–1.0)
Monocytes Absolute: 0.6 10*3/uL (ref 0.1–1.0)
Monocytes Relative: 8 %
NEUTROS ABS: 5.6 10*3/uL (ref 1.7–7.7)
NEUTROS PCT: 63 %
Neutro Abs: 6.8 10*3/uL (ref 1.7–7.7)
Neutrophils Relative %: 71 %
PLATELETS: 194 10*3/uL (ref 150–400)
Platelets: 182 10*3/uL (ref 150–400)
RBC: 4.38 MIL/uL (ref 3.87–5.11)
RBC: 4.55 MIL/uL (ref 3.87–5.11)
RDW: 14 % (ref 11.5–15.5)
RDW: 13.9 % (ref 11.5–15.5)
WBC: 8.7 10*3/uL (ref 4.0–10.5)
WBC: 9.7 10*3/uL (ref 4.0–10.5)

## 2015-08-08 LAB — LACTIC ACID, PLASMA
Lactic Acid, Venous: 1 mmol/L (ref 0.5–2.0)
Lactic Acid, Venous: 0.9 mmol/L (ref 0.5–2.0)
Lactic Acid, Venous: 1.2 mmol/L (ref 0.5–2.0)

## 2015-08-08 LAB — PROTIME-INR
INR: 0.95 (ref 0.00–1.49)
INR: 1.05 (ref 0.00–1.49)
PROTHROMBIN TIME: 13.9 s (ref 11.6–15.2)
Prothrombin Time: 12.9 seconds (ref 11.6–15.2)

## 2015-08-08 LAB — GLUCOSE, CAPILLARY
GLUCOSE-CAPILLARY: 165 mg/dL — AB (ref 65–99)
Glucose-Capillary: 168 mg/dL — ABNORMAL HIGH (ref 65–99)

## 2015-08-08 LAB — URINALYSIS, ROUTINE W REFLEX MICROSCOPIC
BILIRUBIN URINE: NEGATIVE
Bilirubin Urine: NEGATIVE
GLUCOSE, UA: NEGATIVE mg/dL
Glucose, UA: NEGATIVE mg/dL
Hgb urine dipstick: NEGATIVE
KETONES UR: NEGATIVE mg/dL
Ketones, ur: NEGATIVE mg/dL
NITRITE: NEGATIVE
Nitrite: NEGATIVE
PROTEIN: 100 mg/dL — AB
Protein, ur: 30 mg/dL — AB
SPECIFIC GRAVITY, URINE: 1.011 (ref 1.005–1.030)
Specific Gravity, Urine: 1.018 (ref 1.005–1.030)
UROBILINOGEN UA: 0.2 mg/dL (ref 0.0–1.0)
Urobilinogen, UA: 0.2 mg/dL (ref 0.0–1.0)
pH: 6 (ref 5.0–8.0)
pH: 5.5 (ref 5.0–8.0)

## 2015-08-08 LAB — I-STAT ARTERIAL BLOOD GAS, ED
Acid-Base Excess: 2 mmol/L (ref 0.0–2.0)
Bicarbonate: 26.7 mEq/L — ABNORMAL HIGH (ref 20.0–24.0)
O2 Saturation: 96 %
Patient temperature: 99.8
TCO2: 28 mmol/L (ref 0–100)
pCO2 arterial: 43.3 mmHg (ref 35.0–45.0)
pH, Arterial: 7.401 (ref 7.350–7.450)
pO2, Arterial: 82 mmHg (ref 80.0–100.0)

## 2015-08-08 LAB — PROCALCITONIN
Procalcitonin: <0.1 ng/mL
Procalcitonin: <0.1 ng/mL

## 2015-08-08 LAB — TSH: TSH: 0.569 u[IU]/mL (ref 0.350–4.500)

## 2015-08-08 LAB — APTT
APTT: 28 s (ref 24–37)
aPTT: 29 seconds (ref 24–37)

## 2015-08-08 LAB — INFLUENZA PANEL BY PCR (TYPE A & B)
H1N1FLUPCR: NOT DETECTED
INFLBPCR: NEGATIVE
Influenza A By PCR: NEGATIVE

## 2015-08-08 LAB — URINE MICROSCOPIC-ADD ON

## 2015-08-08 LAB — AMMONIA
AMMONIA: 39 umol/L — AB (ref 9–35)
AMMONIA: 38 umol/L — AB (ref 9–35)

## 2015-08-08 LAB — CBG MONITORING, ED: GLUCOSE-CAPILLARY: 150 mg/dL — AB (ref 65–99)

## 2015-08-08 LAB — I-STAT TROPONIN, ED: Troponin i, poc: 0 ng/mL (ref 0.00–0.08)

## 2015-08-08 LAB — I-STAT CG4 LACTIC ACID, ED
LACTIC ACID, VENOUS: 1.38 mmol/L (ref 0.5–2.0)
Lactic Acid, Venous: 1.29 mmol/L (ref 0.5–2.0)

## 2015-08-08 MED ORDER — VANCOMYCIN HCL IN DEXTROSE 1-5 GM/200ML-% IV SOLN
1000.0000 mg | Freq: Two times a day (BID) | INTRAVENOUS | Status: DC
Start: 2015-08-09 — End: 2015-08-10
  Administered 2015-08-09 – 2015-08-10 (×3): 1000 mg via INTRAVENOUS
  Filled 2015-08-08 (×6): qty 200

## 2015-08-08 MED ORDER — SODIUM CHLORIDE 0.9 % IV SOLN
INTRAVENOUS | Status: DC
  Administered 2015-08-08: 15:00:00 via INTRAVENOUS

## 2015-08-08 MED ORDER — INSULIN DETEMIR 100 UNIT/ML ~~LOC~~ SOLN
20.0000 [IU] | Freq: Every day | SUBCUTANEOUS | Status: DC
  Administered 2015-08-08: 20 [IU] via SUBCUTANEOUS
  Filled 2015-08-08 (×3): qty 0.2

## 2015-08-08 MED ORDER — ACYCLOVIR SODIUM 50 MG/ML IV SOLN
10.0000 mg/kg | Freq: Three times a day (TID) | INTRAVENOUS | Status: DC
  Filled 2015-08-08 (×2): qty 10

## 2015-08-08 MED ORDER — DEXTROSE 5 % IV SOLN
2.0000 g | Freq: Once | INTRAVENOUS | Status: AC
  Administered 2015-08-08: 2 g via INTRAVENOUS
  Filled 2015-08-08: qty 2

## 2015-08-08 MED ORDER — HYDRALAZINE HCL 20 MG/ML IJ SOLN
10.0000 mg | INTRAMUSCULAR | Status: DC | PRN
  Administered 2015-08-08 – 2015-08-10 (×3): 10 mg via INTRAVENOUS
  Filled 2015-08-08 (×3): qty 1

## 2015-08-08 MED ORDER — ONDANSETRON HCL 4 MG PO TABS
4.0000 mg | ORAL_TABLET | Freq: Four times a day (QID) | ORAL | Status: DC | PRN
Start: 2015-08-08 — End: 2015-08-10
  Administered 2015-08-08: 4 mg via ORAL
  Filled 2015-08-08: qty 1

## 2015-08-08 MED ORDER — VANCOMYCIN HCL 10 G IV SOLR
1750.0000 mg | Freq: Once | INTRAVENOUS | Status: AC
  Administered 2015-08-08: 1750 mg via INTRAVENOUS
  Filled 2015-08-08: qty 1750

## 2015-08-08 MED ORDER — LEVOFLOXACIN IN D5W 500 MG/100ML IV SOLN
500.0000 mg | Freq: Once | INTRAVENOUS | Status: AC
  Administered 2015-08-08: 500 mg via INTRAVENOUS
  Filled 2015-08-08 (×3): qty 100

## 2015-08-08 MED ORDER — METOPROLOL TARTRATE 1 MG/ML IV SOLN
2.5000 mg | Freq: Four times a day (QID) | INTRAVENOUS | Status: DC
  Administered 2015-08-08 – 2015-08-09 (×3): 2.5 mg via INTRAVENOUS
  Filled 2015-08-08 (×7): qty 5

## 2015-08-08 MED ORDER — CETYLPYRIDINIUM CHLORIDE 0.05 % MT LIQD: 7.0000 mL | Freq: Two times a day (BID) | OROMUCOSAL | Status: DC

## 2015-08-08 MED ORDER — INSULIN ASPART 100 UNIT/ML ~~LOC~~ SOLN
0.0000 [IU] | SUBCUTANEOUS | Status: DC
  Administered 2015-08-08 (×2): 2 [IU] via SUBCUTANEOUS

## 2015-08-08 MED ORDER — ACETAMINOPHEN 650 MG RE SUPP
650.0000 mg | Freq: Four times a day (QID) | RECTAL | Status: DC | PRN
  Administered 2015-08-08: 650 mg via RECTAL
  Filled 2015-08-08: qty 1

## 2015-08-08 MED ORDER — ACETAMINOPHEN 325 MG PO TABS
650.0000 mg | ORAL_TABLET | Freq: Four times a day (QID) | ORAL | Status: DC | PRN
  Administered 2015-08-08 – 2015-08-10 (×4): 650 mg via ORAL
  Filled 2015-08-08 (×4): qty 2

## 2015-08-08 MED ORDER — DEXTROSE 5 % IV SOLN
2.0000 g | Freq: Three times a day (TID) | INTRAVENOUS | Status: DC
  Administered 2015-08-08 – 2015-08-10 (×6): 2 g via INTRAVENOUS
  Filled 2015-08-08 (×10): qty 2

## 2015-08-08 MED ORDER — CHLORHEXIDINE GLUCONATE 0.12 % MT SOLN
15.0000 mL | Freq: Two times a day (BID) | OROMUCOSAL | Status: DC
  Administered 2015-08-08 – 2015-08-10 (×4): 15 mL via OROMUCOSAL
  Filled 2015-08-08 (×4): qty 15

## 2015-08-08 MED ORDER — ALBUTEROL SULFATE (2.5 MG/3ML) 0.083% IN NEBU: 2.5000 mg | INHALATION_SOLUTION | RESPIRATORY_TRACT | Status: DC | PRN

## 2015-08-08 MED ORDER — ACETAMINOPHEN 650 MG RE SUPP
650.0000 mg | Freq: Once | RECTAL | Status: AC
  Administered 2015-08-08: 650 mg via RECTAL
  Filled 2015-08-08: qty 1

## 2015-08-08 MED ORDER — CETYLPYRIDINIUM CHLORIDE 0.05 % MT LIQD
7.0000 mL | Freq: Two times a day (BID) | OROMUCOSAL | Status: DC
  Administered 2015-08-08 – 2015-08-10 (×5): 7 mL via OROMUCOSAL

## 2015-08-08 MED ORDER — SODIUM CHLORIDE 0.9 % IV SOLN
INTRAVENOUS | Status: DC
  Administered 2015-08-09 (×2): via INTRAVENOUS

## 2015-08-08 MED ORDER — SODIUM CHLORIDE 0.9 % IV BOLUS (SEPSIS)
1000.0000 mL | INTRAVENOUS | Status: AC
  Administered 2015-08-08 (×3): 1000 mL via INTRAVENOUS

## 2015-08-08 MED ORDER — ONDANSETRON HCL 4 MG/2ML IJ SOLN
4.0000 mg | Freq: Four times a day (QID) | INTRAMUSCULAR | Status: DC | PRN
  Administered 2015-08-08: 4 mg via INTRAVENOUS
  Filled 2015-08-08 (×2): qty 2

## 2015-08-08 NOTE — Progress Notes (Signed)
Patient seen while in ED.  No change on Exam. EEG shows no epileptiform activity.  Ammonia 39.  currently Afebrile with HA. AMS likely due to infection. Will continue to follow.   Etta Quill PA-C [facility] [phone]  08/08/2015, 1:11 PM

## 2015-08-08 NOTE — ED Provider Notes (Signed)
CSN: [id number]     Arrival date & time 08/08/15  0003 History  By signing my name below, I, Sonum Patel, attest that this documentation has been prepared under the direction and in the presence of Merryl Hacker, MD. Electronically Signed: Sonum Patel, Education administrator. 08/08/2015. 12:15 AM.    Chief Complaint  Patient presents with  . Altered Mental Status   The history is provided by a relative and the nursing home. No language interpreter was used.    LEVEL 5 CAVEAT: Altered Mental Status HPI Comments: Destiny Robinson is a 59 y.o. female who presents to the Emergency Department from [facility] complaining of constant, AMS and aphasia that began about 4 hours ago. Patient was talking earlier today and was last seen normal about 4 hours ago. At baseline, patient has residual left-sided deficits. 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 Initial presentation for her hemorrhagic stroke, patient had slurred speech and left-sided weakness.  Patient had a fever at the nursing home of 101.2 and is 100.6 rectally in the ED. patient follows commands but is unable to contribute to history. Per the patient's family, this is how she presented for her prior stroke.  Family has noted that she had a cough.  Past Medical History  Diagnosis Date  . Hypertension   . Hypercholesterolemia   . Bronchitis   . Pancreatitis   . Complication of anesthesia 12/11/11    "angry, mean, hateful after" endoscopy & colonoscopy  . Asthma ~ 1991  . Pneumonia 06/2011; 07/2011  . Diabetes mellitus type 2, controlled ([place])   . GERD (gastroesophageal reflux disease)   . GI bleeding 12/21/11  . Hx of colonoscopy with polypectomy 12/11/11    "took out 7; couldn't get #8, that one was precancerous"  . Depression   . 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 Paralysis gastric   . Kidney stones   .  Sleep apnea     uses a cpap-oxygen at night  . Stroke [facility]    Past Surgical History  Procedure Laterality Date  . Cesarean section  1981; 1986  . Esophagogastroduodenoscopy  12/11/2011    Procedure: ESOPHAGOGASTRODUODENOSCOPY (EGD);  Surgeon: Missy Sabins, MD;  Location: [facility] ENDOSCOPY;  Service: Endoscopy;  Laterality: N/A;  . Colonoscopy  12/11/2011    Procedure: COLONOSCOPY;  Surgeon: Missy Sabins, MD;  Location: [place];  Service: Endoscopy;  Laterality: N/A;  . Abdominal hysterectomy  2002  . Cholecystectomy  ~ 2002  . Tonsillectomy and adenoidectomy  1988  . Appendectomy  ~ 2002    "w/hysterectomy"  . Colonoscopy  04/26/2012    Procedure: COLONOSCOPY;  Surgeon: Wonda Horner, MD;  Location: [facility] ENDOSCOPY;  Service: Endoscopy;  Laterality: N/A;  apc  . Hernia repair      Umbilical  . Open reduction internal fixation (orif) distal radial fracture Left 01/07/2015    Procedure: OPEN REDUCTION INTERNAL FIXATION (ORIF) LEFT DISTAL RADIAL FRACTURE;  Surgeon: Milly Jakob, MD;  Location: [place];  Service: Orthopedics;  Laterality: Left;  . Knee arthroscopy with medial menisectomy Right 03/29/2015    Procedure: ARTHROSCOPY KNEE, medial menisectomy, removal of loose body;  Surgeon: Frederik Pear, MD;  Location: [place];  Service: Orthopedics;  Laterality: Right;  RIGHT KNEE ARTHROSCOPY   Family History  Problem Relation Age  of Onset  . Colon cancer Sister   . Alzheimer's disease Mother   . Heart disease Father     Enlarged heart   Social History  Substance Use Topics  . Smoking status: Former Smoker -- 0.12 packs/day for 6 years    Types: Cigarettes    Quit date: 12/09/1996  . Smokeless tobacco: Never Used  . Alcohol Use: No   OB History    No data available     Review of Systems  Unable to perform ROS: Acuity of condition      Allergies  Codeine; Penicillins; Reglan; Xifaxan; Zocor; and Crestor  Home Medications   Prior to Admission  medications   Medication Sig Start Date End Date Taking? Authorizing Provider  acetaminophen (TYLENOL) 500 MG tablet Take 1,000 mg by mouth daily as needed for fever or headache (headache).    Yes Historical Provider, MD  amLODipine (NORVASC) 10 MG tablet Take 1 tablet (10 mg total) by mouth daily. 08/07/15  Yes Donzetta Starch, NP  calcium carbonate (OS-CAL) 600 MG TABS tablet Take 600 mg by mouth daily.   Yes Historical Provider, MD  Cholecalciferol (VITAMIN D PO) Take 400 Units by mouth daily.    Yes Historical Provider, MD  Coenzyme Q10 (CO Q-10) 100 MG CAPS Take 100 mg by mouth at bedtime.   Yes Historical Provider, MD  ezetimibe (ZETIA) 10 MG tablet Take 1 tablet (10 mg total) by mouth daily. 08/07/15  Yes Donzetta Starch, NP  fluticasone (FLONASE) 50 MCG/ACT nasal spray Place 2 sprays into both nostrils daily as needed for allergies.  11/12/14  Yes Historical Provider, MD  gabapentin (NEURONTIN) 300 MG capsule Take 2 capsules (600 mg total) by mouth 3 (three) times daily. Patient taking differently: Take 300 mg by mouth 3 (three) times daily.  04/17/15  Yes Wallene Huh, DPM  hydrALAZINE (APRESOLINE) 50 MG tablet Take 1 tablet (50 mg total) by mouth every 8 (eight) hours. 08/07/15  Yes Donzetta Starch, NP  hydroxypropyl cellulose (LACRISERT) 5 MG INST Place 5 mg into both eyes 3 (three) times daily. 08/07/15  Yes Donzetta Starch, NP  insulin detemir (LEVEMIR) 100 UNIT/ML injection Inject 0.4 mLs (40 Units total) into the skin daily. 08/07/15  Yes Donzetta Starch, NP  labetalol (NORMODYNE) 100 MG tablet Take 1 tablet (100 mg total) by mouth 3 (three) times daily. 08/07/15  Yes Donzetta Starch, NP  lisinopril (PRINIVIL,ZESTRIL) 20 MG tablet Take 1 tablet (20 mg total) by mouth 2 (two) times daily. 08/07/15  Yes Donzetta Starch, NP  LORazepam (ATIVAN) 0.5 MG tablet Take 1 tablet (0.5 mg total) by mouth 4 (four) times daily. FOR ANXIETY 08/07/15  Yes Donzetta Starch, NP  Multiple Vitamin (MULTIVITAMIN WITH MINERALS) TABS  tablet Take 1 tablet by mouth daily.   Yes Historical Provider, MD  potassium chloride (K-DUR,KLOR-CON) 10 MEQ tablet Take 1 tablet (10 mEq total) by mouth 2 (two) times daily. 08/07/15  Yes Donzetta Starch, NP  pravastatin (PRAVACHOL) 80 MG tablet Take 1 tablet (80 mg total) by mouth daily. 08/07/15 08/06/16 Yes Donzetta Starch, NP  PROAIR HFA 108 (90 BASE) MCG/ACT inhaler USE 2 PUFFS EVERY 6 HOURS AS NEEDED FOR WHEEZING   Yes Clinton D Young, MD  senna-docusate (SENOKOT-S) 8.6-50 MG tablet Take 1 tablet by mouth 2 (two) times daily. 08/07/15  Yes Donzetta Starch, NP  venlafaxine XR (EFFEXOR-XR) 75 MG 24 hr capsule Take 75 mg by mouth 3 (  three) times daily.   Yes Historical Provider, MD   BP 168/82 mmHg  Pulse 85  Temp(Src) 100.6 F (38.1 C) (Rectal)  Resp 19  SpO2 97% Physical Exam  Constitutional:  Ill-appearing but nontoxic  HENT:  Head: Normocephalic and atraumatic.  Mouth/Throat: Oropharynx is clear and moist.  Eyes: Pupils are equal, round, and reactive to light.  Pupils 3 mm reactive bilaterally  Neck: Neck supple.  Cardiovascular: Normal rate, regular rhythm and normal heart sounds.   No murmur heard. Pulmonary/Chest: Effort normal and breath sounds normal. No respiratory distress. She has no wheezes.  Abdominal: Soft. Bowel sounds are normal. There is no tenderness. There is no rebound.  Musculoskeletal: She exhibits edema.  Neurological: She is alert.  Follows commands, will not her head to simple questions, appears to have an expressive aphasia, left facial droop noted, left-sided paralysis noted  Skin: Skin is warm and dry.  Nursing note and vitals reviewed.   ED Course  Procedures (including critical care time)  CRITICAL CARE Performed by: Merryl Hacker   Total critical care time: 45 minutes  Critical care time was exclusive of separately billable procedures and treating other patients.  Critical care was necessary to treat or prevent imminent or life-threatening  deterioration.  Critical care was time spent personally by me on the following activities: development of treatment plan with patient and/or surrogate as well as nursing, discussions with consultants, evaluation of patient's response to treatment, examination of patient, obtaining history from patient or surrogate, ordering and performing treatments and interventions, ordering and review of laboratory studies, ordering and review of radiographic studies, pulse oximetry and re-evaluation of patient's condition.   DIAGNOSTIC STUDIES: Oxygen Saturation is 96% on [place] 2 L/min, adequate by my interpretation.    COORDINATION OF CARE: 12:22 AM Discussed treatment plan with family at bedside and they agreed to plan.  12:27 AM Discussed case with Dr. Janann Colonel.  Patient febrile. Left-sided deficits noted. Follows commands but will not speak. Per nurses, spoke some upon arrival. Patient would not be a TPA candidate given recent hemorrhagic stroke. Will not initiate at this time.   Labs Review Labs Reviewed  COMPREHENSIVE METABOLIC PANEL - Abnormal; Notable for the following:    CO2 21 (*)    Glucose, Bld 181 (*)    Albumin 3.3 (*)    Anion gap 16 (*)    All other components within normal limits  URINALYSIS, ROUTINE W REFLEX MICROSCOPIC (NOT AT [facility]) - Abnormal; Notable for the following:    Protein, ur 100 (*)    Leukocytes, UA SMALL (*)    All other components within normal limits  CULTURE, BLOOD (ROUTINE X 2)  CULTURE, BLOOD (ROUTINE X 2)  URINE CULTURE  CBC WITH DIFFERENTIAL/PLATELET  PROCALCITONIN  PROTIME-INR  APTT  URINE MICROSCOPIC-ADD ON  I-STAT CG4 LACTIC ACID, ED  I-STAT TROPOININ, ED  I-STAT CG4 LACTIC ACID, ED  I-STAT ARTERIAL BLOOD GAS, ED    Imaging Review Ct Head Wo Contrast  08/08/2015  CLINICAL DATA:  Altered mental status. EXAM: CT HEAD WITHOUT CONTRAST TECHNIQUE: Contiguous axial images were obtained from the base of the skull through the vertex without intravenous  contrast. COMPARISON:  08/03/2015 FINDINGS: There is slight reduction in volume of the right thalamus hemorrhage compared to the prior studies. The volume of intraventricular blood is not significantly different. The size of the ventricles is unchanged. Mild right to left midline shift is unchanged. There is no new intracranial hemorrhage. No new focal brain abnormality  is evident. There is remote lacunar infarction in the caudate nuclei. Basal cisterns remain patent. No acute bony abnormality is evident. IMPRESSION: Slowly decreasing volume of the right bowel is hemorrhage. Unchanged intraventricular hemorrhage. Mild midline shift is also unchanged. No new intracranial hemorrhage. Electronically Signed   By: Andreas Newport M.D.   On: 08/08/2015 01:32   Mr Brain Wo Contrast  08/08/2015  CLINICAL DATA:  Altered mental status and aphasia beginning 4 hours ago. Baseline of LEFT-sided deficits from prior stroke. Fever. EXAM: MRI HEAD WITHOUT CONTRAST TECHNIQUE: Multiplanar, multiecho pulse sequences of the brain and surrounding structures were obtained without intravenous contrast. COMPARISON:  CT head August 08, 2015 at 1:20 a.m. and MRI of the head October 01, 2014 FINDINGS: No reduced diffusion to suggest acute ischemia. Spurious reduced diffusion within the RIGHT thalamic hemorrhage, with faint peripheral susceptibility artifact, rim of T1 shortening. RIGHT thalamus 3.9 x 3.2 cm hematoma with surrounding T2 bright vasogenic edema results in local mass-effect, partially effacing the third ventricle. Increasing ventriculomegaly, compatible with mild hydrocephalus. Layering susceptibility artifact in the occipital horns with moderate ventriculomegaly. Moderate global parenchymal brain volume loss for age. Additional scattered micro hemorrhages predominantly in the periphery of the brain and cerebellum, similar. Small amount of extra-axial hemosiderin staining compatible with prior subarachnoid hemorrhage.  Innumerable tiny T2 hyperintensities in the basal ganglia favor perivascular spaces. In addition, old bilateral basal ganglia lacunar infarcts. No abnormal extra-axial fluid collections. dolicoectatic intracranial vascular flow voids. Ocular globes and orbital contents are unremarkable. Paranasal sinuses are well aerated. Trace LEFT mastoid effusion. Severe LEFT temporomandibular osteoarthrosis. No abnormal calvarial bone marrow signal. Patient is edentulous. IMPRESSION: No acute infarct. Evolving subacute RIGHT thalamic intraparenchymal hemorrhage. Persistent mass effect on the third ventricle with intraventricular extension and mild hydrocephalus. Similar micro hemorrhages most compatible with chronic hypertension, corroborated by dolicoectatic intracranial vessels. Evidence of old subarachnoid hemorrhage/redistributed blood products. Mild parenchymal brain volume loss for age. Fall bilateral basal ganglia lacunar infarcts. Electronically Signed   By: Elon Alas M.D.   On: 08/08/2015 04:52   Dg Chest Port 1 View  08/08/2015  CLINICAL DATA:  Fever, altered mental status. History of hypertension, bronchitis, diabetes, cancer. EXAM: PORTABLE CHEST 1 VIEW COMPARISON:  Chest radiograph August 04, 2015 FINDINGS: Cardiac silhouette is upper limits of normal in size, even with consideration to this low inspiratory portable examination with crowded vascular markings. Tortuous aorta. No pleural effusion or focal consolidation. Patient rotated to the RIGHT. No pneumothorax. Soft tissue planes and included osseous structures are nonsuspicious. Moderate degenerative change of the thoracic spine. IMPRESSION: Borderline cardiomegaly, no acute pulmonary process in this low inspiratory portable examination. Electronically Signed   By: Elon Alas M.D.   On: 08/08/2015 00:43   I have personally reviewed and evaluated these images and lab results as part of my medical decision-making.   EKG  Interpretation   Date/Time:  Thursday August 08 2015 00:04:30 EDT Ventricular Rate:  94 PR Interval:  160 QRS Duration: 84 QT Interval:  376 QTC Calculation: 470 R Axis:   -2 Text Interpretation:  Sinus rhythm Nonspecific T abnormalities, lateral  leads No significant change since last tracing Confirmed by Rilla Buckman  MD,  Costella Schwarz ([id number]) on 08/08/2015 1:36:08 AM      MDM   Final diagnoses:  Other specified fever  UTI (lower urinary tract infection)  Altered mental status, unspecified altered mental status type  History of intracranial hemorrhage    Patient presents with fever and altered mental status. Was just  discharged earlier today after having an intra cranial hemorrhage. She will not talk to me but understands and will follow commands. Her original presentation she had dysarthria and left-sided weakness. Patient reports that she was speaking earlier today. She does have a temperature to 100.6. Family also reports cough. Sepsis workup was initiated. I emergently consulted neurology. Patient would not be a TPA candidate and given fever, feel her etiology for her mental status changes is likely infectious in origin. Discussion with Dr. Janann Colonel. No code stroke initiated as patient's symptoms are similar to her initial manifestation of her intracranial hemorrhage.    3:21 AM Updated Dr. Janann Colonel.  No clear infectious etiology. ?UTI, Patient does have 11-20 white cells in her urine and rare bacteria. Culture is pending. IV Levaquin and aztreonam ordered to cover urine and possible pneumonia given cough. Patient has a penicillin allergy. X-rays negative. Given that the patient continues to have aphasia, will obtain MRI.  This was discussed with Dr. Janann Colonel.  5:29 AM MRI without acute infarct. Patient has been stable in the ER. Given that she is not at her baseline, feel she needs admission for further evaluation and management.  On multiple rechecks in the ER, patient remains somnolent but  arousable. She will intermittently follow commands. Still nonverbal. Discussed with the admitting hospitalist. He is requesting ABG which is reasonable to evaluate for hypercarbia. He is also requesting formal neurology consult. Discussed with Dr. Janann Colonel. Specific question regarding possible seizures.  I personally performed the services described in this documentation, which was scribed in my presence. The recorded information has been reviewed and is accurate.   Merryl Hacker, MD 08/08/15 0530

## 2015-08-08 NOTE — ED Notes (Signed)
POCT CBG resulted 150; visitors at bedside; patient is sleeping at this time

## 2015-08-08 NOTE — Progress Notes (Signed)
Portable EEG completed, results pending. 

## 2015-08-08 NOTE — H&P (Signed)
[facility] History and Physical  Destiny Robinson XBM:[id number] DOB: 1956-06-03 DOA: 08/08/2015  Referring physician: Dr.Horton. PCP: [name], MD  Specialists: None.  Chief Complaint: Altered mental status.  History of ER physician and recent chart as I am unable to reach patient's family.  HPI: Destiny Robinson is a 59 y.o. female who was discharged yesterday after being managed for hemorrhagic CVA, hypertensive emergency was brought to the ER from the nursing after patient was found to have difficulty talking, confusion and fever. In the ER patient was initially communicative and was oriented to name and following commands as per the ER physician but soon became drowsy and confused. CT of the head and MRI brain shows nothing acute. UA shows features concerning for UTI. Chest x-ray was unremarkable. On-call neurologist Dr. Janann Colonel was consulted and patient has been admitted for acute encephalopathy. Patient on my exam is not responsive but is able to protect airways. I'm unable to reach patient's family to get further history. As per ER physician patient also had some cough.   Review of Systems: As presented in the history of presenting illness, rest negative.  Past Medical History  Diagnosis Date  . Hypertension   . Hypercholesterolemia   . Bronchitis   . Pancreatitis   . Complication of anesthesia 12/11/11    "angry, mean, hateful after" endoscopy & colonoscopy  . Asthma ~ 1991  . Pneumonia 06/2011; 07/2011  . Diabetes mellitus type 2, controlled ([place])   . GERD (gastroesophageal reflux disease)   . GI bleeding 12/21/11  . Hx of colonoscopy with polypectomy 12/11/11    "took out 7; couldn't get #8, that one was precancerous"  . Depression   . 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 Paralysis gastric   . Kidney stones   . Sleep apnea     uses a cpap-oxygen at night  . Stroke [facility])    Past Surgical History  Procedure Laterality  Date  . Cesarean section  1981; 1986  . Esophagogastroduodenoscopy  12/11/2011    Procedure: ESOPHAGOGASTRODUODENOSCOPY (EGD);  Surgeon: Missy Sabins, MD;  Location: [facility] ENDOSCOPY;  Service: Endoscopy;  Laterality: N/A;  . Colonoscopy  12/11/2011    Procedure: COLONOSCOPY;  Surgeon: Missy Sabins, MD;  Location: [place];  Service: Endoscopy;  Laterality: N/A;  . Abdominal hysterectomy  2002  . Cholecystectomy  ~ 2002  . Tonsillectomy and adenoidectomy  1988  . Appendectomy  ~ 2002    "w/hysterectomy"  . Colonoscopy  04/26/2012    Procedure: COLONOSCOPY;  Surgeon: Wonda Horner, MD;  Location: [facility] ENDOSCOPY;  Service: Endoscopy;  Laterality: N/A;  apc  . Hernia repair      Umbilical  . Open reduction internal fixation (orif) distal radial fracture Left 01/07/2015    Procedure: OPEN REDUCTION INTERNAL FIXATION (ORIF) LEFT DISTAL RADIAL FRACTURE;  Surgeon: Milly Jakob, MD;  Location: [place];  Service: Orthopedics;  Laterality: Left;  . Knee arthroscopy with medial menisectomy Right 03/29/2015    Procedure: ARTHROSCOPY KNEE, medial menisectomy, removal of loose body;  Surgeon: Frederik Pear, MD;  Location: [place];  Service: Orthopedics;  Laterality: Right;  RIGHT KNEE ARTHROSCOPY   Social History:  reports that she quit smoking about 18 years ago. Her smoking use included Cigarettes. She has a .72 pack-year smoking history. She has never used smokeless tobacco. She reports that she does not drink alcohol or use illicit  drugs. Where does patient live presently in rehabilitation. Can patient participate in ADLs? Not sure.  Allergies  Allergen Reactions  . Codeine Hypertension  . Penicillins Anaphylaxis  . Reglan [Metoclopramide] Swelling and Rash    5MG  MAKES THROAT SWELL  . Xifaxan [Rifaximin] Hives, Shortness Of Breath and Swelling    SOB 550MG  MAKES THROAT SWELL SOB  . Zocor [Simvastatin] Anaphylaxis and Hives  . Crestor [Rosuvastatin] Other (See  Comments)    Muscles ache    Family History:  Family History  Problem Relation Age of Onset  . Colon cancer Sister   . Alzheimer's disease Mother   . Heart disease Father     Enlarged heart      Prior to Admission medications   Medication Sig Start Date End Date Taking? Authorizing Provider  acetaminophen (TYLENOL) 500 MG tablet Take 1,000 mg by mouth daily as needed for fever or headache (headache).    Yes Historical Provider, MD  amLODipine (NORVASC) 10 MG tablet Take 1 tablet (10 mg total) by mouth daily. 08/07/15  Yes Donzetta Starch, NP  calcium carbonate (OS-CAL) 600 MG TABS tablet Take 600 mg by mouth daily.   Yes Historical Provider, MD  Cholecalciferol (VITAMIN D PO) Take 400 Units by mouth daily.    Yes Historical Provider, MD  Coenzyme Q10 (CO Q-10) 100 MG CAPS Take 100 mg by mouth at bedtime.   Yes Historical Provider, MD  ezetimibe (ZETIA) 10 MG tablet Take 1 tablet (10 mg total) by mouth daily. 08/07/15  Yes Donzetta Starch, NP  fluticasone (FLONASE) 50 MCG/ACT nasal spray Place 2 sprays into both nostrils daily as needed for allergies.  11/12/14  Yes Historical Provider, MD  gabapentin (NEURONTIN) 300 MG capsule Take 2 capsules (600 mg total) by mouth 3 (three) times daily. Patient taking differently: Take 300 mg by mouth 3 (three) times daily.  04/17/15  Yes Wallene Huh, DPM  hydrALAZINE (APRESOLINE) 50 MG tablet Take 1 tablet (50 mg total) by mouth every 8 (eight) hours. 08/07/15  Yes Donzetta Starch, NP  hydroxypropyl cellulose (LACRISERT) 5 MG INST Place 5 mg into both eyes 3 (three) times daily. 08/07/15  Yes Donzetta Starch, NP  insulin detemir (LEVEMIR) 100 UNIT/ML injection Inject 0.4 mLs (40 Units total) into the skin daily. 08/07/15  Yes Donzetta Starch, NP  labetalol (NORMODYNE) 100 MG tablet Take 1 tablet (100 mg total) by mouth 3 (three) times daily. 08/07/15  Yes Donzetta Starch, NP  lisinopril (PRINIVIL,ZESTRIL) 20 MG tablet Take 1 tablet (20 mg total) by mouth 2 (two) times  daily. 08/07/15  Yes Donzetta Starch, NP  LORazepam (ATIVAN) 0.5 MG tablet Take 1 tablet (0.5 mg total) by mouth 4 (four) times daily. FOR ANXIETY 08/07/15  Yes Donzetta Starch, NP  Multiple Vitamin (MULTIVITAMIN WITH MINERALS) TABS tablet Take 1 tablet by mouth daily.   Yes Historical Provider, MD  potassium chloride (K-DUR,KLOR-CON) 10 MEQ tablet Take 1 tablet (10 mEq total) by mouth 2 (two) times daily. 08/07/15  Yes Donzetta Starch, NP  pravastatin (PRAVACHOL) 80 MG tablet Take 1 tablet (80 mg total) by mouth daily. 08/07/15 08/06/16 Yes Donzetta Starch, NP  PROAIR HFA 108 (90 BASE) MCG/ACT inhaler USE 2 PUFFS EVERY 6 HOURS AS NEEDED FOR WHEEZING   Yes Clinton D Young, MD  senna-docusate (SENOKOT-S) 8.6-50 MG tablet Take 1 tablet by mouth 2 (two) times daily. 08/07/15  Yes Donzetta Starch, NP  venlafaxine XR [facility])  75 MG 24 hr capsule Take 75 mg by mouth 3 (three) times daily.   Yes Historical Provider, MD    Physical Exam: Filed Vitals:   08/08/15 0607 08/08/15 0615 08/08/15 0700 08/08/15 0720  BP:  167/83 149/89   Pulse:  85 96   Temp: 101.3 F (38.5 C)   99.9 F (37.7 C)  TempSrc: Rectal   Rectal  Resp:  24 23   SpO2:  97% 91%      General:  Obese not in distress.  Eyes: Perla positive. Anicteric. No pallor.  ENT: No discharge from the ears eyes nose and mouth.  Neck: No neck rigidity. No mass felt.  Cardiovascular: S1-S2 heard.  Respiratory: No rhonchi or crepitations.  Abdomen: Soft nontender bowel sounds present.  Skin: No rash.  Musculoskeletal: No edema.  Psychiatric: Patient is unresponsive to verbal stimuli.  Neurologic: Patient is unresponsive to verbal stimuli. Further neurological exam not possible. Perla positive.  Labs on Admission:  Basic Metabolic Panel:  Recent Labs Lab 08/03/15 0319 08/04/15 0300 08/04/15 0750 08/05/15 0340 08/08/15 0022  NA 144 142 141 142 142  K 2.7* 2.7* 3.2* 3.6 3.7  CL 107 104 106 105 105  CO2 25 27 25 28  21*  GLUCOSE 164*  140* 169* 179* 181*  BUN 9 6 6 7 14   CREATININE 0.54 0.44 0.47 0.50 0.57  CALCIUM 8.9 9.1 8.9 9.2 9.5   Liver Function Tests:  Recent Labs Lab 08/04/15 0300 08/05/15 0340 08/08/15 0022  AST 24 21 22   ALT 20 17 18   ALKPHOS 50 50 53  BILITOT 0.6 0.4 0.5  PROT 7.1 6.6 7.2  ALBUMIN 2.9* 2.9* 3.3*   No results for input(s): LIPASE, AMYLASE in the last 168 hours. No results for input(s): AMMONIA in the last 168 hours. CBC:  Recent Labs Lab 08/03/15 0319 08/04/15 0300 08/05/15 0340 08/08/15 0022  WBC 9.8 9.8 8.9 9.7  NEUTROABS  --   --   --  6.8  HGB 14.5 14.1 12.8 13.0  HCT 44.0 43.1 39.7 40.2  MCV 87.3 88.0 88.4 88.4  PLT 194 173 170 194   Cardiac Enzymes: No results for input(s): CKTOTAL, CKMB, CKMBINDEX, TROPONINI in the last 168 hours.  BNP (last 3 results) No results for input(s): BNP in the last 8760 hours.  ProBNP (last 3 results) No results for input(s): PROBNP in the last 8760 hours.  CBG:  Recent Labs Lab 08/06/15 1643 08/06/15 2340 08/07/15 0650 08/07/15 1120 08/07/15 1613  GLUCAP 149* 201* 166* 218* 193*    Radiological Exams on Admission: Ct Head Wo Contrast  08/08/2015  CLINICAL DATA:  Altered mental status. EXAM: CT HEAD WITHOUT CONTRAST TECHNIQUE: Contiguous axial images were obtained from the base of the skull through the vertex without intravenous contrast. COMPARISON:  08/03/2015 FINDINGS: There is slight reduction in volume of the right thalamus hemorrhage compared to the prior studies. The volume of intraventricular blood is not significantly different. The size of the ventricles is unchanged. Mild right to left midline shift is unchanged. There is no new intracranial hemorrhage. No new focal brain abnormality is evident. There is remote lacunar infarction in the caudate nuclei. Basal cisterns remain patent. No acute bony abnormality is evident. IMPRESSION: Slowly decreasing volume of the right bowel is hemorrhage. Unchanged intraventricular  hemorrhage. Mild midline shift is also unchanged. No new intracranial hemorrhage. Electronically Signed   By: Andreas Newport M.D.   On: 08/08/2015 01:32   Mr Brain Wo Contrast  08/08/2015  CLINICAL DATA:  Altered mental status and aphasia beginning 4 hours ago. Baseline of LEFT-sided deficits from prior stroke. Fever. EXAM: MRI HEAD WITHOUT CONTRAST TECHNIQUE: Multiplanar, multiecho pulse sequences of the brain and surrounding structures were obtained without intravenous contrast. COMPARISON:  CT head August 08, 2015 at 1:20 a.m. and MRI of the head October 01, 2014 FINDINGS: No reduced diffusion to suggest acute ischemia. Spurious reduced diffusion within the RIGHT thalamic hemorrhage, with faint peripheral susceptibility artifact, rim of T1 shortening. RIGHT thalamus 3.9 x 3.2 cm hematoma with surrounding T2 bright vasogenic edema results in local mass-effect, partially effacing the third ventricle. Increasing ventriculomegaly, compatible with mild hydrocephalus. Layering susceptibility artifact in the occipital horns with moderate ventriculomegaly. Moderate global parenchymal brain volume loss for age. Additional scattered micro hemorrhages predominantly in the periphery of the brain and cerebellum, similar. Small amount of extra-axial hemosiderin staining compatible with prior subarachnoid hemorrhage. Innumerable tiny T2 hyperintensities in the basal ganglia favor perivascular spaces. In addition, old bilateral basal ganglia lacunar infarcts. No abnormal extra-axial fluid collections. dolicoectatic intracranial vascular flow voids. Ocular globes and orbital contents are unremarkable. Paranasal sinuses are well aerated. Trace LEFT mastoid effusion. Severe LEFT temporomandibular osteoarthrosis. No abnormal calvarial bone marrow signal. Patient is edentulous. IMPRESSION: No acute infarct. Evolving subacute RIGHT thalamic intraparenchymal hemorrhage. Persistent mass effect on the third ventricle with  intraventricular extension and mild hydrocephalus. Similar micro hemorrhages most compatible with chronic hypertension, corroborated by dolicoectatic intracranial vessels. Evidence of old subarachnoid hemorrhage/redistributed blood products. Mild parenchymal brain volume loss for age. Fall bilateral basal ganglia lacunar infarcts. Electronically Signed   By: Elon Alas M.D.   On: 08/08/2015 04:52   Dg Chest Port 1 View  08/08/2015  CLINICAL DATA:  Fever, altered mental status. History of hypertension, bronchitis, diabetes, cancer. EXAM: PORTABLE CHEST 1 VIEW COMPARISON:  Chest radiograph August 04, 2015 FINDINGS: Cardiac silhouette is upper limits of normal in size, even with consideration to this low inspiratory portable examination with crowded vascular markings. Tortuous aorta. No pleural effusion or focal consolidation. Patient rotated to the RIGHT. No pneumothorax. Soft tissue planes and included osseous structures are nonsuspicious. Moderate degenerative change of the thoracic spine. IMPRESSION: Borderline cardiomegaly, no acute pulmonary process in this low inspiratory portable examination. Electronically Signed   By: Elon Alas M.D.   On: 08/08/2015 00:43    EKG: Independently reviewed. Normal sinus rhythm. Nonspecific T-wave changes.  Assessment/Plan Principal Problem:   Acute encephalopathy Active Problems:   Cerebrovascular accident [facility])   Essential hypertension   HLD (hyperlipidemia)   Type 2 diabetes mellitus with vascular disease ([place])   1. Acute encephalopathy - cause not known but patient is mildly febrile and UA shows features concerning for UTI. Appreciate neurology consult. We will check EEG and ammonia levels and follow blood cultures and urine cultures. At this time patient has been empirically placed on vancomycin and Azactam and acyclovir for meningitis. Patient has had recent intracranial bleed neurology advised against a lumbar puncture. Check influenza  PCR. 2. Hypertension with recent admission for hypertensive emergency - patient is nothing by mouth I have placed patient on when necessary IV hydralazine for systolic blood pressure more than 160 and on scheduled dose of metoprolol. If patient's blood pressure worsens may need infusion for blood pressure control. 3. Diabetes mellitus type 2 - since patient is nothing by mouth I have decreased patient's Levemir dose with sliding scale coverage. Closely follow metabolic panel and CBC. 4. Hyperlipidemia - status could be continued once patient  can take orally. 5. Recent admission for hemorrhagic CVA.  I have reviewed patient's old chart and labs. Appreciate neurology consult. Personally reviewed EKG and chest x-ray.   DVT Prophylaxis SCDs secondary to recent intracranial bleed.  Code Status: Full code.  Family Communication: Unable to reach family.  Disposition Plan: Admit to inpatient.    Mariame Rybolt N. [facility] Pager [phone].  If 7PM-7AM, please contact night-coverage [email or web address] Password [email or web address] 08/08/2015, 7:22 AM

## 2015-08-08 NOTE — Progress Notes (Signed)
ANTIBIOTIC CONSULT NOTE - INITIAL  Pharmacy Consult for Azactam and Vancomycin Indication: r/o UTI  Allergies  Allergen Reactions  . Codeine Hypertension  . Penicillins Anaphylaxis  . Reglan [Metoclopramide] Swelling and Rash    5MG  MAKES THROAT SWELL  . Xifaxan [Rifaximin] Hives, Shortness Of Breath and Swelling    SOB 550MG  MAKES THROAT SWELL SOB  . Zocor [Simvastatin] Anaphylaxis and Hives  . Crestor [Rosuvastatin] Other (See Comments)    Muscles ache    Patient Measurements:   Vital Signs: Temp: 100.2 F (37.9 C) (11/03 1300) Temp Source: Rectal (11/03 0720) BP: 172/96 mmHg (11/03 1300) Pulse Rate: 90 (11/03 1300) Intake/Output from previous day: 11/02 0701 - 11/03 0700 In: 3000 [I.V.:3000] Out: 500 [Urine:500] Intake/Output from this shift: Total I/O In: -  Out: 1000 [Urine:1000]  Labs:  Recent Labs  08/08/15 0022  WBC 9.7  HGB 13.0  PLT 194  CREATININE 0.57    Microbiology: CX DATA: 11/3 blood: px  11/3:urine: px ( UA with TNT WBC and Large Leukocytes)   ABX:  11/3: azactam << 11/3 vancomycin << 11/3 Levaquin x 1   Assessment: 58yoF with recent hx of intracranial bleed admitted with HA encephalopathy thought to secondary to infection. UA noted to TNT WBC and have large number of leukocytes.  PCN: allergy = anaphylaxis   Goal of Therapy:  Vancomycin trough level 10-15 mcg/ml  Plan:  1. Continue Azactam 2 gms q8h; if no bacteremia can likely lower dose 2. Vancomycin 1750 mg LD, followed by 1000 mg q12H 3. Await px micro data and narrow abx as feasible  Vincenza Hews, PharmD, BCPS 08/08/2015, 2:32 PM Pager: [phone]

## 2015-08-08 NOTE — ED Notes (Signed)
Pt come by [facility] from [facility]. The facility said that at 4pm she was talking and interacting when they checked on her at 11pm she was Altered and not verbally responding and at the facility had a temp of 101.2

## 2015-08-08 NOTE — Progress Notes (Signed)
Patient Demographics:    Destiny Robinson, is a 59 y.o. female, DOB - Mar 02, 1956, EBX:[id number]  Admit date - 08/08/2015   Admitting Physician No admitting provider for patient encounter.  Outpatient Primary MD for the patient is [name], MD  LOS - 0   Chief Complaint  Patient presents with  . Altered Mental Status        Subjective:    Douglass Rivers today in ER bed, poor historian but in no distress, she is somewhat somnolent but denies any chest or abdominal pain, says is having mild headache   Assessment  & Plan :     1. Acute encephalopathy in a patient with known recent intracranial bleed. Was discharged recently for this problem. Repeat CT MRI noted, seen by neurology in the ER. For now encephalopathy could be due to UTI, EEG to rule out seizure. Supportive care with antibiotics, IV fluids, currently nothing by mouth. Ammonia also added to blood work, ABG stable, chest x-ray stable.   2. Essential hypertension. Since nothing by mouth as needed IV hydralazine. Lopressor if she can tolerate oral meds.    3. DM type II. Currently nothing by mouth. Liver and the dose has been reduced, sliding scale coverage.   4. Dyslipidemia. Statin once taking oral medications.    Code Status : Full  Family Communication  : None present  Disposition Plan  : Step down unit  Consults  :  Neurology  Procedures  : EEG and MRI brain consistent with known history of intracerebral hemorrhage with mass effect  DVT Prophylaxis  :   SCDs   Lab Results  Component Value Date   PLT 194 08/08/2015    Inpatient Medications  Scheduled Meds:  Continuous Infusions: . aztreonam     PRN Meds:.  Antibiotics  :     Anti-infectives    Start     Dose/Rate Route Frequency Ordered Stop   08/08/15 1400  aztreonam (AZACTAM) 2 g in dextrose 5 % 50 mL IVPB     2 g 100 mL/hr over 30 Minutes Intravenous 3 times per day 08/08/15 0527     08/08/15 0515  aztreonam (AZACTAM) 2 g in dextrose 5 % 50 mL IVPB     2 g 100 mL/hr over 30 Minutes Intravenous  Once 08/08/15 0511 08/08/15 0605   08/08/15 0330  levofloxacin (LEVAQUIN) IVPB 500 mg     500 mg 100 mL/hr over 60 Minutes Intravenous  Once 08/08/15 0317 08/08/15 0720        Objective:   Filed Vitals:   08/08/15 0615 08/08/15 0700 08/08/15 0720 08/08/15 0730  BP: 167/83 149/89  164/77  Pulse: 85 96  88  Temp:   99.9 F (37.7 C)   TempSrc:   Rectal   Resp: 24 23  25   SpO2: 97% 91%  93%    Wt Readings from Last 3 Encounters:  08/06/15 87.5 kg (192 lb 14.4 oz)  04/15/15 87.816 kg (193 lb 9.6 oz)  03/29/15 89.994 kg (198 lb 6.4 oz)     Intake/Output Summary (Last 24 hours) at 08/08/15 0950 Last data filed at 08/08/15 0332  Gross per 24 hour  Intake   3000 ml  Output    500 ml  Net   2500 ml     Physical Exam  Somnolent, left-sided hemiparesis with left-sided neglect, answers few basic questions and follows few basic commands .AT,PERRAL Supple Neck,No JVD, No cervical lymphadenopathy appriciated.  Symmetrical Chest wall movement, Good air movement bilaterally, CTAB RRR,No Gallops,Rubs or new Murmurs, No Parasternal Heave +ve B.Sounds, Abd Soft, No tenderness, No organomegaly appriciated, No rebound - guarding or rigidity. No Cyanosis, Clubbing or edema, No new Rash or bruise       Data Review:   Micro Results Recent Results (from the past 240 hour(s))  MRSA PCR Screening     Status: None   Collection Time: 07/29/15 11:20 AM  Result Value Ref Range Status   MRSA by PCR NEGATIVE NEGATIVE Final    Comment:        The GeneXpert MRSA Assay (FDA approved for NASAL specimens only), is one component of a comprehensive MRSA colonization surveillance program. It is not intended to diagnose MRSA infection  nor to guide or monitor treatment for MRSA infections.     Radiology Reports   Ct Head Wo Contrast  08/08/2015  CLINICAL DATA:  Altered mental status. EXAM: CT HEAD WITHOUT CONTRAST TECHNIQUE: Contiguous axial images were obtained from the base of the skull through the vertex without intravenous contrast. COMPARISON:  08/03/2015 FINDINGS: There is slight reduction in volume of the right thalamus hemorrhage compared to the prior studies. The volume of intraventricular blood is not significantly different. The size of the ventricles is unchanged. Mild right to left midline shift is unchanged. There is no new intracranial hemorrhage. No new focal brain abnormality is evident. There is remote lacunar infarction in the caudate nuclei. Basal cisterns remain patent. No acute bony abnormality is evident. IMPRESSION: Slowly decreasing volume of the right bowel is hemorrhage. Unchanged intraventricular hemorrhage. Mild midline shift is also unchanged. No new intracranial hemorrhage. Electronically Signed   By: Andreas Newport M.D.   On: 08/08/2015 01:32      Mr Brain Wo Contrast  08/08/2015  CLINICAL DATA:  Altered mental status and aphasia beginning 4 hours ago. Baseline of LEFT-sided deficits from prior stroke. Fever. EXAM: MRI HEAD WITHOUT CONTRAST TECHNIQUE: Multiplanar, multiecho pulse sequences of the brain and surrounding structures were obtained without intravenous contrast. COMPARISON:  CT head August 08, 2015 at 1:20 a.m. and MRI of the head October 01, 2014 FINDINGS: No reduced diffusion to suggest acute ischemia. Spurious reduced diffusion within the RIGHT thalamic hemorrhage, with faint peripheral susceptibility artifact, rim of T1 shortening. RIGHT thalamus 3.9 x 3.2 cm hematoma with surrounding T2 bright vasogenic edema results in local mass-effect, partially effacing the third ventricle. Increasing ventriculomegaly, compatible with mild hydrocephalus. Layering susceptibility artifact in the  occipital horns with moderate ventriculomegaly. Moderate global parenchymal brain volume loss for age. Additional scattered micro hemorrhages predominantly in the periphery of the brain and cerebellum, similar. Small amount of extra-axial hemosiderin staining compatible with prior subarachnoid hemorrhage. Innumerable tiny T2 hyperintensities in the basal ganglia favor perivascular spaces. In addition, old bilateral basal ganglia lacunar infarcts. No abnormal extra-axial fluid collections. dolicoectatic intracranial vascular flow voids. Ocular globes and orbital contents are unremarkable. Paranasal sinuses are well aerated. Trace LEFT mastoid effusion. Severe LEFT temporomandibular osteoarthrosis. No abnormal calvarial bone marrow signal. Patient is edentulous. IMPRESSION: No acute infarct. Evolving subacute RIGHT thalamic intraparenchymal hemorrhage. Persistent mass effect on the third ventricle with intraventricular extension and mild hydrocephalus. Similar micro hemorrhages most compatible with chronic hypertension, corroborated by dolicoectatic intracranial vessels. Evidence of old subarachnoid  hemorrhage/redistributed blood products. Mild parenchymal brain volume loss for age. Fall bilateral basal ganglia lacunar infarcts. Electronically Signed   By: Elon Alas M.D.   On: 08/08/2015 04:52    Dg Chest Port 1 View  08/08/2015  CLINICAL DATA:  Fever, altered mental status. History of hypertension, bronchitis, diabetes, cancer. EXAM: PORTABLE CHEST 1 VIEW COMPARISON:  Chest radiograph August 04, 2015 FINDINGS: Cardiac silhouette is upper limits of normal in size, even with consideration to this low inspiratory portable examination with crowded vascular markings. Tortuous aorta. No pleural effusion or focal consolidation. Patient rotated to the RIGHT. No pneumothorax. Soft tissue planes and included osseous structures are nonsuspicious. Moderate degenerative change of the thoracic spine. IMPRESSION:  Borderline cardiomegaly, no acute pulmonary process in this low inspiratory portable examination. Electronically Signed   By: Elon Alas M.D.   On: 08/08/2015 00:43        CBC  Recent Labs Lab 08/03/15 0319 08/04/15 0300 08/05/15 0340 08/08/15 0022  WBC 9.8 9.8 8.9 9.7  HGB 14.5 14.1 12.8 13.0  HCT 44.0 43.1 39.7 40.2  PLT 194 173 170 194  MCV 87.3 88.0 88.4 88.4  MCH 28.8 28.8 28.5 28.6  MCHC 33.0 32.7 32.2 32.3  RDW 13.9 13.7 13.8 14.0  LYMPHSABS  --   --   --  2.0  MONOABS  --   --   --  0.7  EOSABS  --   --   --  0.1  BASOSABS  --   --   --  0.1    Chemistries   Recent Labs Lab 08/03/15 0319 08/04/15 0300 08/04/15 0750 08/05/15 0340 08/08/15 0022  NA 144 142 141 142 142  K 2.7* 2.7* 3.2* 3.6 3.7  CL 107 104 106 105 105  CO2 25 27 25 28  21*  GLUCOSE 164* 140* 169* 179* 181*  BUN 9 6 6 7 14   CREATININE 0.54 0.44 0.47 0.50 0.57  CALCIUM 8.9 9.1 8.9 9.2 9.5  AST  --  24  --  21 22  ALT  --  20  --  17 18  ALKPHOS  --  50  --  50 53  BILITOT  --  0.6  --  0.4 0.5   ------------------------------------------------------------------------------------------------------------------ estimated creatinine clearance is 80.7 mL/min (by C-G formula based on Cr of 0.57). ------------------------------------------------------------------------------------------------------------------ No results for input(s): HGBA1C in the last 72 hours. ------------------------------------------------------------------------------------------------------------------ No results for input(s): CHOL, HDL, LDLCALC, TRIG, CHOLHDL, LDLDIRECT in the last 72 hours. ------------------------------------------------------------------------------------------------------------------  Recent Labs  08/08/15 0655  TSH 0.569   ------------------------------------------------------------------------------------------------------------------ No results for input(s): VITAMINB12, FOLATE, FERRITIN,  TIBC, IRON, RETICCTPCT in the last 72 hours.  Coagulation profile  Recent Labs Lab 08/08/15 0022  INR 0.95    No results for input(s): DDIMER in the last 72 hours.  Cardiac Enzymes No results for input(s): CKMB, TROPONINI, MYOGLOBIN in the last 168 hours.  Invalid input(s): CK ------------------------------------------------------------------------------------------------------------------ Invalid input(s): POCBNP   Time Spent in minutes   35   Reubin Bushnell K M.D on 08/08/2015 at 9:50 AM  Between 7am to 7pm - Pager - [phone]  After 7pm go to [email or web address] - password [facility]  [facility] -  Office  [phone]

## 2015-08-08 NOTE — ED Notes (Signed)
Assisted Santiago Glad, RN with placing a temperature foley catheter in patient; visitors stepped out to the ED waiting area

## 2015-08-08 NOTE — ED Notes (Addendum)
This RN walked into the pt's room and found that the pt's eye were open. Pt squeezes this RN's hand, but doesn't not follow other commands. Pt falls back to sleep when not being spoken to.

## 2015-08-08 NOTE — Consult Note (Signed)
Consult Reason for Consult: altered mental status Referring Physician: Dr Dina Rich ED  CC: AMS  HPI: Destiny Robinson is an 59 y.o. female with hx of recent hemorrhagic CVA (discharged 08/07/2015) admitted with altered mental status and difficulty speaking. Per records she had residual left sided weakness but was not having difficulty speaking upon discharge from hospital yesterday. Was discharged to NH where she was noted to have a fever of 101.2. ED RN notes that upon arrival to ED patient appeared confused but was oriented to name, date, location, able to follow commands. Shortly after became more confused and non-verbal. In ED, initial rectal temperature of 100.6, repeat axillary temperature 99.8. WBC of 9.7, UA positive for leukocytes, rare bacteria.   CT head imaging reviewed, shows right thalamic ICH with intraventricular hemorrhage. MRI brain shows no acute process.     Past Medical History  Diagnosis Date  . Hypertension   . Hypercholesterolemia   . Bronchitis   . Pancreatitis   . Complication of anesthesia 12/11/11    "angry, mean, hateful after" endoscopy & colonoscopy  . Asthma ~ 1991  . Pneumonia 06/2011; 07/2011  . Diabetes mellitus type 2, controlled ([place])   . GERD (gastroesophageal reflux disease)   . GI bleeding 12/21/11  . Hx of colonoscopy with polypectomy 12/11/11    "took out 7; couldn't get #8, that one was precancerous"  . Depression   . 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 Paralysis gastric   . Kidney stones   . Sleep apnea     uses a cpap-oxygen at night  . Stroke [facility])     Past Surgical History  Procedure Laterality Date  . Cesarean section  1981; 1986  . Esophagogastroduodenoscopy  12/11/2011    Procedure: ESOPHAGOGASTRODUODENOSCOPY (EGD);  Surgeon: Missy Sabins, MD;  Location: [facility] ENDOSCOPY;  Service: Endoscopy;  Laterality: N/A;  . Colonoscopy  12/11/2011    Procedure: COLONOSCOPY;  Surgeon: Missy Sabins, MD;  Location: [place];  Service: Endoscopy;  Laterality: N/A;  . Abdominal hysterectomy  2002  . Cholecystectomy  ~ 2002  . Tonsillectomy and adenoidectomy  1988  . Appendectomy  ~ 2002    "w/hysterectomy"  . Colonoscopy  04/26/2012    Procedure: COLONOSCOPY;  Surgeon: Wonda Horner, MD;  Location: [facility] ENDOSCOPY;  Service: Endoscopy;  Laterality: N/A;  apc  . Hernia repair      Umbilical  . Open reduction internal fixation (orif) distal radial fracture Left 01/07/2015    Procedure: OPEN REDUCTION INTERNAL FIXATION (ORIF) LEFT DISTAL RADIAL FRACTURE;  Surgeon: Milly Jakob, MD;  Location: [place];  Service: Orthopedics;  Laterality: Left;  . Knee arthroscopy with medial menisectomy Right 03/29/2015    Procedure: ARTHROSCOPY KNEE, medial menisectomy, removal of loose body;  Surgeon: Frederik Pear, MD;  Location: [place];  Service: Orthopedics;  Laterality: Right;  RIGHT KNEE ARTHROSCOPY    Family History  Problem Relation Age of Onset  . Colon cancer Sister   . Alzheimer's disease Mother   . Heart disease Father     Enlarged heart    Social History:  reports that she quit smoking about 18 years ago. Her smoking use included Cigarettes. She has a .72 pack-year smoking history. She has never used smokeless tobacco. She reports that she does not drink alcohol or use illicit drugs.  Allergies  Allergen Reactions  . Codeine Hypertension  . Penicillins Anaphylaxis  .  Reglan [Metoclopramide] Swelling and Rash    5MG  MAKES THROAT SWELL  . Xifaxan [Rifaximin] Hives, Shortness Of Breath and Swelling    SOB 550MG  MAKES THROAT SWELL SOB  . Zocor [Simvastatin] Anaphylaxis and Hives  . Crestor [Rosuvastatin] Other (See Comments)    Muscles ache    Medications: I have reviewed the patient's current medications.  ROS: Out of a complete 14 system review, the patient complains of only the following symptoms, and all other reviewed systems are  negative. +confusion  Physical Examination: Filed Vitals:   08/08/15 0450  BP: 168/82  Pulse: 85  Temp:   Resp: 19   Physical Exam  Constitutional: He appears well-developed and well-nourished.  Psych: Affect appropriate to situation Eyes: No scleral injection HENT: No OP obstrucion Head: Normocephalic. No nuchal rigidity Cardiovascular: Normal rate and regular rhythm.  Respiratory: Effort normal and breath sounds normal.  GI: Soft. Bowel sounds are normal. No distension. There is no tenderness.  Skin: WDI  Neurologic Examination Mental Status: Lethargic, no eye opening to voice, will briefly open eyes to noxious stimuli and then close them. Localizes to noxious stimuli. Non-verbal. Will follow simple commands on the right side (squeeze fingers and wiggle right toes)  Cranial Nerves: II: optic discs not visualized, absent blink to threat on left side, pupils equal, round, reactive to light  III,IV, VI: ptosis not present, eyes midline, no gaze deviation V,VII: left facial weakness VIII: unable to test IX,X: gag reflex present XI: unable to test XII: unable to test Motor: Lifts RUE against gravity with noxious stimuli. Will withdrawal and lift RLE against gravity.  Flaccid LUE and LLE Sensory: withdrawals to noxious stimuli in RUE and RLE. No withdrawal in LUE and LLE Deep Tendon Reflexes: 1+ and symmetric throughout Plantars: Right: downgoing   Left: downgoing Cerebellar: Unable to test Gait: deferred  Laboratory Studies:   Basic Metabolic Panel:  Recent Labs Lab 08/03/15 0319 08/04/15 0300 08/04/15 0750 08/05/15 0340 08/08/15 0022  NA 144 142 141 142 142  K 2.7* 2.7* 3.2* 3.6 3.7  CL 107 104 106 105 105  CO2 25 27 25 28  21*  GLUCOSE 164* 140* 169* 179* 181*  BUN 9 6 6 7 14   CREATININE 0.54 0.44 0.47 0.50 0.57  CALCIUM 8.9 9.1 8.9 9.2 9.5    Liver Function Tests:  Recent Labs Lab 08/04/15 0300 08/05/15 0340 08/08/15 0022  AST 24 21 22   ALT  20 17 18   ALKPHOS 50 50 53  BILITOT 0.6 0.4 0.5  PROT 7.1 6.6 7.2  ALBUMIN 2.9* 2.9* 3.3*   No results for input(s): LIPASE, AMYLASE in the last 168 hours. No results for input(s): AMMONIA in the last 168 hours.  CBC:  Recent Labs Lab 08/03/15 0319 08/04/15 0300 08/05/15 0340 08/08/15 0022  WBC 9.8 9.8 8.9 9.7  NEUTROABS  --   --   --  6.8  HGB 14.5 14.1 12.8 13.0  HCT 44.0 43.1 39.7 40.2  MCV 87.3 88.0 88.4 88.4  PLT 194 173 170 194    Cardiac Enzymes: No results for input(s): CKTOTAL, CKMB, CKMBINDEX, TROPONINI in the last 168 hours.  BNP: Invalid input(s): POCBNP  CBG:  Recent Labs Lab 08/06/15 1643 08/06/15 2340 08/07/15 0650 08/07/15 1120 08/07/15 1613  GLUCAP 149* 201* 166* 218* 193*    Microbiology: Results for orders placed or performed during the hospital encounter of 07/29/15  MRSA PCR Screening     Status: None   Collection Time: 07/29/15 11:20 AM  Result Value Ref Range Status   MRSA by PCR NEGATIVE NEGATIVE Final    Comment:        The GeneXpert MRSA Assay (FDA approved for NASAL specimens only), is one component of a comprehensive MRSA colonization surveillance program. It is not intended to diagnose MRSA infection nor to guide or monitor treatment for MRSA infections.     Coagulation Studies:  Recent Labs  08/08/15 0022  LABPROT 12.9  INR 0.95    Urinalysis:  Recent Labs Lab 08/08/15 0155  COLORURINE YELLOW  LABSPEC 1.018  PHURINE 6.0  GLUCOSEU NEGATIVE  HGBUR NEGATIVE  BILIRUBINUR NEGATIVE  KETONESUR NEGATIVE  PROTEINUR 100*  UROBILINOGEN 0.2  NITRITE NEGATIVE  LEUKOCYTESUR SMALL*    Lipid Panel:     Component Value Date/Time   CHOL 237* 07/30/2015 0211   TRIG 405* 07/30/2015 0211   HDL 47 07/30/2015 0211   CHOLHDL 5.0 07/30/2015 0211   VLDL UNABLE TO CALCULATE IF TRIGLYCERIDE OVER 400 mg/dL 07/30/2015 0211   LDLCALC UNABLE TO CALCULATE IF TRIGLYCERIDE OVER 400 mg/dL 07/30/2015 0211    HgbA1C:  Lab  Results  Component Value Date   HGBA1C 6.7* 07/30/2015    Urine Drug Screen:     Component Value Date/Time   LABOPIA NONE DETECTED 07/29/2015 0930   COCAINSCRNUR NONE DETECTED 07/29/2015 0930   LABBENZ POSITIVE* 07/29/2015 0930   AMPHETMU NONE DETECTED 07/29/2015 0930   THCU NONE DETECTED 07/29/2015 0930   LABBARB NONE DETECTED 07/29/2015 0930    Alcohol Level: No results for input(s): ETH in the last 168 hours.  Other results:  Imaging: Ct Head Wo Contrast  08/08/2015  CLINICAL DATA:  Altered mental status. EXAM: CT HEAD WITHOUT CONTRAST TECHNIQUE: Contiguous axial images were obtained from the base of the skull through the vertex without intravenous contrast. COMPARISON:  08/03/2015 FINDINGS: There is slight reduction in volume of the right thalamus hemorrhage compared to the prior studies. The volume of intraventricular blood is not significantly different. The size of the ventricles is unchanged. Mild right to left midline shift is unchanged. There is no new intracranial hemorrhage. No new focal brain abnormality is evident. There is remote lacunar infarction in the caudate nuclei. Basal cisterns remain patent. No acute bony abnormality is evident. IMPRESSION: Slowly decreasing volume of the right bowel is hemorrhage. Unchanged intraventricular hemorrhage. Mild midline shift is also unchanged. No new intracranial hemorrhage. Electronically Signed   By: Andreas Newport M.D.   On: 08/08/2015 01:32   Mr Brain Wo Contrast  08/08/2015  CLINICAL DATA:  Altered mental status and aphasia beginning 4 hours ago. Baseline of LEFT-sided deficits from prior stroke. Fever. EXAM: MRI HEAD WITHOUT CONTRAST TECHNIQUE: Multiplanar, multiecho pulse sequences of the brain and surrounding structures were obtained without intravenous contrast. COMPARISON:  CT head August 08, 2015 at 1:20 a.m. and MRI of the head October 01, 2014 FINDINGS: No reduced diffusion to suggest acute ischemia. Spurious reduced  diffusion within the RIGHT thalamic hemorrhage, with faint peripheral susceptibility artifact, rim of T1 shortening. RIGHT thalamus 3.9 x 3.2 cm hematoma with surrounding T2 bright vasogenic edema results in local mass-effect, partially effacing the third ventricle. Increasing ventriculomegaly, compatible with mild hydrocephalus. Layering susceptibility artifact in the occipital horns with moderate ventriculomegaly. Moderate global parenchymal brain volume loss for age. Additional scattered micro hemorrhages predominantly in the periphery of the brain and cerebellum, similar. Small amount of extra-axial hemosiderin staining compatible with prior subarachnoid hemorrhage. Innumerable tiny T2 hyperintensities in the basal ganglia favor perivascular spaces.  In addition, old bilateral basal ganglia lacunar infarcts. No abnormal extra-axial fluid collections. dolicoectatic intracranial vascular flow voids. Ocular globes and orbital contents are unremarkable. Paranasal sinuses are well aerated. Trace LEFT mastoid effusion. Severe LEFT temporomandibular osteoarthrosis. No abnormal calvarial bone marrow signal. Patient is edentulous. IMPRESSION: No acute infarct. Evolving subacute RIGHT thalamic intraparenchymal hemorrhage. Persistent mass effect on the third ventricle with intraventricular extension and mild hydrocephalus. Similar micro hemorrhages most compatible with chronic hypertension, corroborated by dolicoectatic intracranial vessels. Evidence of old subarachnoid hemorrhage/redistributed blood products. Mild parenchymal brain volume loss for age. Fall bilateral basal ganglia lacunar infarcts. Electronically Signed   By: Elon Alas M.D.   On: 08/08/2015 04:52   Dg Chest Port 1 View  08/08/2015  CLINICAL DATA:  Fever, altered mental status. History of hypertension, bronchitis, diabetes, cancer. EXAM: PORTABLE CHEST 1 VIEW COMPARISON:  Chest radiograph August 04, 2015 FINDINGS: Cardiac silhouette is upper  limits of normal in size, even with consideration to this low inspiratory portable examination with crowded vascular markings. Tortuous aorta. No pleural effusion or focal consolidation. Patient rotated to the RIGHT. No pneumothorax. Soft tissue planes and included osseous structures are nonsuspicious. Moderate degenerative change of the thoracic spine. IMPRESSION: Borderline cardiomegaly, no acute pulmonary process in this low inspiratory portable examination. Electronically Signed   By: Elon Alas M.D.   On: 08/08/2015 00:43     Assessment/Plan:  58y/o woman with hx of recent hemorrhagic CVA, HTN, HLD discharged from hospital on 11/02. Re-admitted on 11/03 after spiking fever at [facility] with noted confusion and speech difficulties. In ED, initially noted to be alert, oriented x 3 then developed increased lethargy and speech deficits. MRI brain and CT head stable with no acute process. UA shows possible UTI  -EEG -infectious workup -ammonia, TSH -will continue to follow   Jim Like, DO [facility] [phone]  If 7pm- 7am, please page neurology on call as listed in [place]. 08/08/2015, 5:31 AM

## 2015-08-08 NOTE — Discharge Summary (Signed)
Expand All Collapse All   Stroke Discharge Summary  Patient Destiny Robinson MRN: [id number] DOB: 14-Apr-1956  Date of Admission: 07/29/2015 Date of Discharge: 08/07/2015  Attending Physician: Garvin Fila, MD, Stroke MD  Consulting Physician(s):   Delice Lesch, MD (Physical Medicine & Rehabtilitation)  Patient's PCP: [name], MD  DISCHARGE DIAGNOSIS: Right BG bleeding with intraventricular extension - likely hypertensive  Cerebral edema, improved Diabetes Hypertensive Emergency Hyperlipidemia Nausea/Vomiting Obesity, Body mass index is 35.27 kg/(m^2).  Complicated migraine Obstructive sleep apnea, on CPAP  Hypokalemia - supplement. K - 2.7 08/04/2015 despite supplementation, rechecked 10/31 at 3.6  Mild leukocytosis, resolved to 8.9 CXR with low lung volumes Oral thrush. Added nystatin x 7 day Anixety. Resumed home dose ativan  Past Medical History  Diagnosis Date  . Hypertension   . Hypercholesterolemia   . Bronchitis   . Pancreatitis   . Complication of anesthesia 12/11/11    "angry, mean, hateful after" endoscopy & colonoscopy  . Asthma ~ 1991  . Pneumonia 06/2011; 07/2011  . Diabetes mellitus type 2, controlled ([place])   . GERD (gastroesophageal reflux disease)   . GI bleeding 12/21/11  . Hx of colonoscopy with polypectomy 12/11/11    "took out 7; couldn't get #8, that one was precancerous"  . Depression   . Cancer [facility])     "female parts; they got it all when I had hysterectomy"  . Paralysis gastric   . Kidney stones   . Sleep apnea     uses a cpap-oxygen at night   Past Surgical History  Procedure Laterality Date  . Cesarean section  1981; 1986  . Esophagogastroduodenoscopy  12/11/2011    Procedure: ESOPHAGOGASTRODUODENOSCOPY  (EGD); Surgeon: Missy Sabins, MD; Location: [facility] ENDOSCOPY; Service: Endoscopy; Laterality: N/A;  . Colonoscopy  12/11/2011    Procedure: COLONOSCOPY; Surgeon: Missy Sabins, MD; Location: [place]; Service: Endoscopy; Laterality: N/A;  . Abdominal hysterectomy  2002  . Cholecystectomy  ~ 2002  . Tonsillectomy and adenoidectomy  1988  . Appendectomy  ~ 2002    "w/hysterectomy"  . Colonoscopy  04/26/2012    Procedure: COLONOSCOPY; Surgeon: Wonda Horner, MD; Location: [facility] ENDOSCOPY; Service: Endoscopy; Laterality: N/A; apc  . Hernia repair      Umbilical  . Open reduction internal fixation (orif) distal radial fracture Left 01/07/2015    Procedure: OPEN REDUCTION INTERNAL FIXATION (ORIF) LEFT DISTAL RADIAL FRACTURE; Surgeon: Milly Jakob, MD; Location: [place]; Service: Orthopedics; Laterality: Left;  . Knee arthroscopy with medial menisectomy Right 03/29/2015    Procedure: ARTHROSCOPY KNEE, medial menisectomy, removal of loose body; Surgeon: Frederik Pear, MD; Location: [place]; Service: Orthopedics; Laterality: Right; RIGHT KNEE ARTHROSCOPY      Medication List    STOP taking these medications       ALIGN PO     clopidogrel 75 MG tablet  Commonly known as: PLAVIX     glipiZIDE 5 MG 24 hr tablet  Commonly known as: GLUCOTROL XL     hydrochlorothiazide 25 MG tablet  Commonly known as: HYDRODIURIL     metFORMIN 500 MG tablet  Commonly known as: GLUCOPHAGE     metoprolol tartrate 25 MG tablet  Commonly known as: LOPRESSOR     niacin 250 MG tablet     nortriptyline 50 MG capsule  Commonly known as: PAMELOR     NOVOLIN N 100 UNIT/ML injection  Generic drug: insulin NPH Human     oxyCODONE-acetaminophen 5-325 MG tablet  Commonly known  as: ROXICET      TAKE these medications       acetaminophen 500 MG  tablet  Commonly known as: TYLENOL  Take 1,000 mg by mouth daily as needed for fever or headache (headache).     amLODipine 10 MG tablet  Commonly known as: NORVASC  Take 1 tablet (10 mg total) by mouth daily.     calcium carbonate 600 MG Tabs tablet  Commonly known as: OS-CAL  Take 600 mg by mouth daily.     Co Q-10 100 MG Caps  Take 100 mg by mouth at bedtime.     ezetimibe 10 MG tablet  Commonly known as: ZETIA  Take 1 tablet (10 mg total) by mouth daily.     fluticasone 50 MCG/ACT nasal spray  Commonly known as: FLONASE  Place 2 sprays into both nostrils daily as needed for allergies.     gabapentin 300 MG capsule  Commonly known as: NEURONTIN  Take 2 capsules (600 mg total) by mouth 3 (three) times daily.     hydrALAZINE 50 MG tablet  Commonly known as: APRESOLINE  Take 1 tablet (50 mg total) by mouth every 8 (eight) hours.     hydroxypropyl cellulose 5 MG Inst  Place 5 mg into both eyes 3 (three) times daily.     insulin detemir 100 UNIT/ML injection  Commonly known as: LEVEMIR  Inject 0.4 mLs (40 Units total) into the skin daily.     labetalol 100 MG tablet  Commonly known as: NORMODYNE  Take 1 tablet (100 mg total) by mouth 3 (three) times daily.     lisinopril 20 MG tablet  Commonly known as: PRINIVIL,ZESTRIL  Take 1 tablet (20 mg total) by mouth 2 (two) times daily.     LORazepam 0.5 MG tablet  Commonly known as: ATIVAN  Take 0.5 mg by mouth 4 (four) times daily. FOR ANXIETY     multivitamin with minerals Tabs tablet  Take 1 tablet by mouth daily.     potassium chloride 10 MEQ tablet  Commonly known as: K-DUR,KLOR-CON  Take 1 tablet (10 mEq total) by mouth 2 (two) times daily.     pravastatin 80 MG tablet  Commonly known as: PRAVACHOL  Take 1 tablet (80 mg total) by mouth daily.     PROAIR HFA 108 (90 BASE) MCG/ACT inhaler  Generic drug: albuterol  USE  2 PUFFS EVERY 6 HOURS AS NEEDED FOR WHEEZING     senna-docusate 8.6-50 MG tablet  Commonly known as: Senokot-S  Take 1 tablet by mouth 2 (two) times daily.     venlafaxine XR 75 MG 24 hr capsule  Commonly known as: EFFEXOR-XR  Take 75 mg by mouth 3 (three) times daily.     VITAMIN D PO  Take 400 Units by mouth daily.        LABORATORY STUDIES CBG (last 3)   Recent Labs (last 2 labs)      Recent Labs  08/06/15 2340 08/07/15 0650 08/07/15 1120  GLUCAP 201* 166* 218*     CBC  Labs (Brief)       Component Value Date/Time   WBC 8.9 08/05/2015 0340   RBC 4.49 08/05/2015 0340   HGB 12.8 08/05/2015 0340   HCT 39.7 08/05/2015 0340   PLT 170 08/05/2015 0340   MCV 88.4 08/05/2015 0340   MCH 28.5 08/05/2015 0340   MCHC 32.2 08/05/2015 0340   RDW 13.8 08/05/2015 0340   LYMPHSABS 0.9 07/29/2015 0659   MONOABS 0.5  07/29/2015 0659   EOSABS 0.0 07/29/2015 0659   BASOSABS 0.0 07/29/2015 0659     CMP  Labs (Brief)       Component Value Date/Time   NA 142 08/05/2015 0340   K 3.6 08/05/2015 0340   CL 105 08/05/2015 0340   CO2 28 08/05/2015 0340   GLUCOSE 179* 08/05/2015 0340   BUN 7 08/05/2015 0340   CREATININE 0.50 08/05/2015 0340   CALCIUM 9.2 08/05/2015 0340   PROT 6.6 08/05/2015 0340   ALBUMIN 2.9* 08/05/2015 0340   AST 21 08/05/2015 0340   ALT 17 08/05/2015 0340   ALKPHOS 50 08/05/2015 0340   BILITOT 0.4 08/05/2015 0340   GFRNONAA >60 08/05/2015 0340   GFRAA >60 08/05/2015 0340     COAGS  Recent Labs    Lab Results  Component Value Date   INR 1.05 07/29/2015   INR 0.96 12/21/2011     Lipid Panel  Labs (Brief)       Component Value Date/Time   CHOL 237* 07/30/2015 0211   TRIG 405* 07/30/2015 0211   HDL 47 07/30/2015 0211   CHOLHDL 5.0 07/30/2015 0211   VLDL UNABLE TO  CALCULATE IF TRIGLYCERIDE OVER 400 mg/dL 07/30/2015 0211   LDLCALC UNABLE TO CALCULATE IF TRIGLYCERIDE OVER 400 mg/dL 07/30/2015 0211     HgbA1C   Recent Labs    Lab Results  Component Value Date   HGBA1C 6.7* 07/30/2015     Cardiac Panel (last 3 results)    Recent Labs (last 2 labs)     No results for input(s): CKTOTAL, CKMB, TROPONINI, RELINDX in the last 72 hours.   Urinalysis  Labs (Brief)       Component Value Date/Time   COLORURINE YELLOW 07/29/2015 0930   APPEARANCEUR CLEAR 07/29/2015 0930   LABSPEC 1.027 07/29/2015 0930   PHURINE 6.5 07/29/2015 0930   GLUCOSEU >1000* 07/29/2015 0930   HGBUR SMALL* 07/29/2015 0930   BILIRUBINUR NEGATIVE 07/29/2015 0930   KETONESUR 15* 07/29/2015 0930   PROTEINUR >300* 07/29/2015 0930   UROBILINOGEN 0.2 07/29/2015 0930   NITRITE NEGATIVE 07/29/2015 0930   LEUKOCYTESUR NEGATIVE 07/29/2015 0930     Urine Drug Screen   Labs (Brief)       Component Value Date/Time   LABOPIA NONE DETECTED 07/29/2015 0930   COCAINSCRNUR NONE DETECTED 07/29/2015 0930   LABBENZ POSITIVE* 07/29/2015 0930   AMPHETMU NONE DETECTED 07/29/2015 0930   THCU NONE DETECTED 07/29/2015 0930   LABBARB NONE DETECTED 07/29/2015 0930      Alcohol Level  Labs (Brief)       Component Value Date/Time   ETH <5 07/29/2015 0659       SIGNIFICANT DIAGNOSTIC STUDIES  Ct Angio Head and neck W/cm &/or Wo Cm Ct Head Wo Contrast 07/31/2015  IMPRESSION: 1. No significant interval change in the size of a right thalamic hemorrhage with extension into the brainstem. 2. Surrounding edema and midline shift is stable. 3. Slight increase in interventricular hemorrhage with layering blood products. There is no hydrocephalus. 4. Hypoattenuation in the brainstem is likely artifactual, appearance similar to the prior exam. 5. Stable atrophy and white matter disease otherwise.    07/30/2015  IMPRESSION: CT head: Evolving RIGHT thalamic intraparenchymal hematoma, no suspicious enhancement. CTA neck: No acute vascular or hemodynamically significant stenosis. Tortuous vessels compatible with chronic hypertension. CTA head: No acute large vessel occlusion Moderate to high-grade stenosis RIGHT P2 segment (likely thromboembolic, less likely focal vasospasm). Dolicoectatic intracranial vessels compatible with chronic hypertension.  07/29/2015  IMPRESSION: 1. Right thalamic hemorrhage with 6 mm of midline shift from right to left. 2. Intraventricular hemorrhage. 3. Old left basal ganglia lacunar infarct. 4. 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   Ct Cervical Spine Wo Contrast 07/29/2015  IMPRESSION: 1. No maxillofacial fractures. 2. No cervical spine fracture or subluxation. 3. Cervical spine degenerative changes. 4. Mild bilateral carotid artery atheromatous calcifications.   DG Chest Port 1 View 08/04/2015 Low lung volumes.  Carotid Doppler Bilateral: 1-39% ICA stenosis. Vertebral artery flow is antegrade.  2D Echocardiogram  - Left ventricle: The cavity size was normal. There was mildconcentric hypertrophy. Systolic function was normal. Theestimated ejection fraction was in the range of 60% to 65%. Wallmotion was normal; there were no regional wall motionabnormalities. There was an increased relative contribution ofatrial contraction to ventricular filling. Doppler parameters areconsistent with abnormal left ventricular relaxation (grade 1diastolic dysfunction). - Aortic valve: Mildly to moderately calcified annulus. Trileaflet;mildly thickened, mildly calcified leaflets. There was trivialregurgitation. - Mitral valve: Calcified annulus. Minimal diffuse calcification ofthe anterior leaflet and posterior leaflet. - Atrial septum: There was increased thickness of the septum,consistent with lipomatous hypertrophy. - Pulmonary  arteries: PA peak pressure: 32 mm Hg (S).  FEES  07/31/2015 Pt has a mild-moderate oropharyngeal dysphagia secondary to left-sided weakness. Lingual manipulation is weak with slow transit, leading to decreased bolus cohesion for pharyngeal transfer. Pharyngeal weakness results in mild residuals that remain with all consistencies tested. Despite weakness and premature spill, there is no airway compromise observed. Recommend to resume thin liquid (clear liquid) diet. Pt would be appropriate for advancement to Dys 1 diet and thin liquids as nausea improves.    HISTORY OF PRESENT ILLNESS ELLI GROESBECK is an 59 y.o. female with a history of multiple medical problems who reports awakening this morning and attempting to get out of bed. Patient fell and hit the nightstand. Was unable get off the floor. EMS was called and patient was brought in as a code stroke. Initial NIHSS of 19. She was last known well 07/28/2015 at 22:00. CT showed R thalamic hemorrhage. She was admitted for further evaluation and treatment.    HOSPITAL COURSE Destiny Robinson is a 59 y.o. female with history of HTN, HLD, DM, diabetic gastroparesis, GIB, obesity, OSA on CPAP admitted for acute onset left weakness causing fall. Symptoms unchanged.   Right BG bleeding with intraventricular extension - likely hypertensive   Stable ICH and no hydrocephalus  Resultant - left hemiplegia, dysarthria, left neglect  CT showed right BG ICH with IVH  CT repeat showed stable ICH and no hydrocephalus  CTA head and neck - right P2 stenosis but otherwise unremarkable  Carotid Doppler unremarkable  2D Echo unremarkable  LDL unable to calculate due to TG 405  HgbA1c 6.7  clopidogrel 75 mg daily prior to admission, now on No antithrombotic due to [place]. Consider resuming low dose aspirin in 1 month  Therapy recommendations: CIR. After consultation between Dr. Leonie Man and medical director at [facility], it was felt  that pt's needs could be best met at a lower level of care.  Disposition: discharge to SNF for ongoing PT, OT and ST  Cerebral edema, resolved  Midline shift  Na 142->141->139  No neuro change  stable  Diabetes  HgbA1c 6.7, at goal < 7.0  Uncontrolled  CBG monitoring showed improving hyperglycemia  increased levemir to 40 units   Discharge on insulin  Hypertensive Emergency  Home meds: Amlodipine, HCTZ and metoprolol  Initially  on Cleviprex  Put back on amlodipine 10  Home Metoprolol 50 bid changed to labetolol 50   continue lisinopril 20 for better BP control  Add hydralazine for better BP control  Patient counseled to be compliant with her blood pressure medications  BP goal <130/80  Hyperlipidemia  Home meds: pravastatin   LDL not able to calculate, goal < 70  Added zetia due to high TG  Continue pravastatin and zetia at discharge  Nausea/Vomiting  Hx of diabetic gastropresis  QTc 530->449  Stop compazine, add Zofran  Other Stroke Risk Factors  Obesity, Body mass index is 35.27 kg/(m^2).   Complicated migraine - left sided numbness with HA, much better, less frequent, on nortriptyline at home, followed by dr. Erlinda Hong in the office prior to admission. Past stroke workup neg  Obstructive sleep apnea, on CPAP at home  Other Active Problems  Hypokalemia - supplement. K - 2.7 08/04/2015 despite supplementation, rechecked 10/31 at 3.6  Mild leukocytosis, resolved to 8.9  CXR with low lung volumes  Oral thrush. Added nystatin x 7 days  Anixety. Resumed home dose ativan  Other Pertinent History  GIB history   DISCHARGE EXAM Blood pressure 180/87, pulse 120, temperature 99.1 F (37.3 C), temperature source Oral, resp. rate 16, height _0  (1.575 m), weight 87.5 kg (192 lb 14.4 oz), SpO2 99 %. General - Well nourished, well developed,  HEENT: NCAT; pupils reactive; sclera clear; moist mucous membranes Cardiovascular - Regular  rate and rhythm. Pulmonary: CTA GI: Abdomen soft, NT ND Extrem: No C/C/E  Mental Status -  Awake alert and interactive Level of arousal and orientation to time, place, and person were intact. Language including expression, naming, repetition, comprehension was assessed and found intact, but mild dysarthria and mild left hemi-neglect. Cranial Nerves II - XII - II - not blinking to visual threat on the left. Pupils resonsive III, IV, VI - Extraocular movements intact, able to have incomplete left gaze, but right eye gaze preference. V - Facial sensation intact bilaterally. VII - left facial droop. VIII - Hearing & vestibular intact bilaterally. X - Palate elevates symmetrically, dysarthria. XI - shoulder shrug decreased on the left. XII - Tongue protrusion intact.  Motor Strength - The patient's strength was 0/5 LUE and LLE, 5/5 RUE and RLE. Bulk was normal and fasciculations were absent.   Reflexes - The patient's reflexes were symmetrical in all extremities and she had no pathological reflexes.  Sensory - Light touch, temperature/pinprick were assessed; she seems to feel less on the left; however, she reports no difference left hemi-neglect  Coordination - The patient had difficulty with detailed instruction  Gait and Station - not tested due to weakness.  Discharge Diet DIET - DYS 1 Room service appropriate?: No; Fluid consistency:: Thin liquids  DISCHARGE PLAN  Disposition: Discharge to skilled nursing facility for ongoing PT, OT and ST.   Consider low dose aspirin in month for stroke prevention  Follow-up [name], MD in 2 weeks.  Follow-up with Dr. Rosalin Hawking, Stroke Clinic in 2 months.  45 minutes were spent preparing discharge.  [place] for Pager information 08/07/2015 3:33 PM  I have personally examined this patient, reviewed notes, independently viewed imaging studies, participated in medical  decision making and plan of care. I have made any additions or clarifications directly to the above note. Agree with note above.  Antony Contras, MD Medical Director [facility] Pager: [phone] 08/07/2015 4:50 PM

## 2015-08-08 NOTE — Procedures (Signed)
ELECTROENCEPHALOGRAM REPORT  Patient: Destiny Robinson       Room #: XL24 EEG No. ID: [id number] Age: 59 y.o.        Sex: female Referring Physician: [facility], Mamie Nick Report Date:  08/08/2015        Interpreting Physician: Anthony Sar  History: [name] is an 59 y.o. female with recent admission for hemorrhagic stroke readmitted with altered mental status and fever and possible urinary tract infection.  Indications for study:  Rule out encephalopathy.  Technique: This is an 18 channel routine scalp EEG performed at the bedside with bipolar and monopolar montages arranged in accordance to the international 10/20 system of electrode placement.   Description: Patient was noted to be minimally responsive to external stimuli and this EEG recording. Redundant activity consisted of low amplitude use mixed irregular delta and theta activity. During periods of light sleep symmetrical sleep spindles and vertex waves were recorded. No epileptiform discharges were recorded.  Interpretation: EEG is abnormal with moderate generalized continuous slowing of cerebral activity which is nonspecific. No evidence of an epileptic disorder was demonstrated.   Rush Farmer M.D. [facility] [phone]

## 2015-08-08 NOTE — ED Notes (Signed)
Janann Colonel, MD at bedside.

## 2015-08-08 NOTE — Progress Notes (Signed)
ABG collected. Gas was drawn Right radial. Procedure was explained to patient. Pt flinched upon needle stick. Site was pressed until bleeding stopped. About 5-3minutes of compression of right wrist. Pt is stable and mouth breathing at this time.

## 2015-08-08 NOTE — ED Notes (Signed)
Pt has become lethargic again, opening her eyes to voice, squeezing this RN's hand, and nodding her head to yes or no questions, but no longer speaking.

## 2015-08-08 NOTE — Evaluation (Signed)
Clinical/Bedside Swallow Evaluation Patient Details  Name: Destiny Robinson MRN: [id number] Date of Birth: 04/28/1956  Today's Date: 08/08/2015 Time: SLP Start Time (ACUTE ONLY): 1829 SLP Stop Time (ACUTE ONLY): 1550 SLP Time Calculation (min) (ACUTE ONLY): 11 min  Past Medical History:  Past Medical History  Diagnosis Date  . Hypertension   . Hypercholesterolemia   . Bronchitis   . Pancreatitis   . Complication of anesthesia 12/11/11    "angry, mean, hateful after" endoscopy & colonoscopy  . Asthma ~ 1991  . Pneumonia 06/2011; 07/2011  . Diabetes mellitus type 2, controlled ([place])   . GERD (gastroesophageal reflux disease)   . GI bleeding 12/21/11  . Hx of colonoscopy with polypectomy 12/11/11    "took out 7; couldn't get #8, that one was precancerous"  . Depression   . 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 Paralysis gastric   . Kidney stones   . Sleep apnea     uses a cpap-oxygen at night  . Stroke [facility])    Past Surgical History:  Past Surgical History  Procedure Laterality Date  . Cesarean section  1981; 1986  . Esophagogastroduodenoscopy  12/11/2011    Procedure: ESOPHAGOGASTRODUODENOSCOPY (EGD);  Surgeon: Missy Sabins, MD;  Location: [facility] ENDOSCOPY;  Service: Endoscopy;  Laterality: N/A;  . Colonoscopy  12/11/2011    Procedure: COLONOSCOPY;  Surgeon: Missy Sabins, MD;  Location: [place];  Service: Endoscopy;  Laterality: N/A;  . Abdominal hysterectomy  2002  . Cholecystectomy  ~ 2002  . Tonsillectomy and adenoidectomy  1988  . Appendectomy  ~ 2002    "w/hysterectomy"  . Colonoscopy  04/26/2012    Procedure: COLONOSCOPY;  Surgeon: Wonda Horner, MD;  Location: [facility] ENDOSCOPY;  Service: Endoscopy;  Laterality: N/A;  apc  . Hernia repair      Umbilical  . Open reduction internal fixation (orif) distal radial fracture Left 01/07/2015    Procedure: OPEN REDUCTION INTERNAL FIXATION (ORIF) LEFT DISTAL RADIAL FRACTURE;  Surgeon: Milly Jakob,  MD;  Location: [place];  Service: Orthopedics;  Laterality: Left;  . Knee arthroscopy with medial menisectomy Right 03/29/2015    Procedure: ARTHROSCOPY KNEE, medial menisectomy, removal of loose body;  Surgeon: Frederik Pear, MD;  Location: [place];  Service: Orthopedics;  Laterality: Right;  RIGHT KNEE ARTHROSCOPY   HPI:  Destiny Robinson is a 59 y.o. female who was discharged 11/2 after being managed for hemorrhagic CVA, hypertensive emergency. She was brought to the ER 11/3 from SNF for AMS, fever. CT Head and MRI Brain without acute changes, UA concerning for UTI.   Assessment / Plan / Recommendation Clinical Impression  Pt's oropharyngeal swallow appears grossly similar to FEES completed 10/26, with cognition impacting the effectiveness of her oral preparation. She has a wet vocal quality that is noted after PO intake is completed, which is cleared easily with a cued throat clear. Recommend to restart Dys 1 diet and thin liquids that had been recommended upon discharge yesterday. Will f/u for tolerance and possible advancement as warranted.    Aspiration Risk  Mild    Diet Recommendation Dysphagia 1 (Puree);Thin   Medication Administration: Crushed with puree Compensations: Slow rate;Small sips/bites;Minimize environmental distractions;Check for pocketing    Other  Recommendations Oral Care Recommendations: Oral care BID;Oral care before and after PO   Follow Up Recommendations   SNF    Frequency and Duration min 2x/week  2 weeks   Pertinent Vitals/Pain n/a    SLP Swallow Goals     Swallow Study Prior Functional Status       General Other Pertinent Information: Destiny Robinson is a 59 y.o. female who was discharged 11/2 after being managed for hemorrhagic CVA, hypertensive emergency. She was brought to the ER 11/3 from SNF for AMS, fever. CT Head and MRI Brain without acute changes, UA concerning for UTI. Type of Study: Bedside swallow  evaluation Previous Swallow Assessment: FEES 10/26 recommending Dys 1 diet and thin liquids Diet Prior to this Study: NPO Temperature Spikes Noted: Yes (101.3) Respiratory Status: Room air History of Recent Intubation: No Behavior/Cognition: Alert;Cooperative;Confused Oral Cavity - Dentition: Poor condition;Missing dentition Self-Feeding Abilities: Able to feed self;Needs assist Patient Positioning: Upright in bed Baseline Vocal Quality: Hoarse    Oral/Motor/Sensory Function Overall Oral Motor/Sensory Function: Other (comment) (CN VII deficits from recent CVA)   Ice Chips Ice chips: Not tested   Thin Liquid Thin Liquid: Impaired Presentation: Cup;Self Fed;Straw Pharyngeal  Phase Impairments: Wet Vocal Quality    Nectar Thick Nectar Thick Liquid: Not tested   Honey Thick Honey Thick Liquid: Not tested   Puree Puree: Impaired Presentation: Self Fed;Spoon Oral Phase Impairments: Poor awareness of bolus Oral Phase Functional Implications: Oral holding Pharyngeal Phase Impairments: Wet Vocal Quality   Solid   Solid: Not tested      Germain Osgood, M.A. CCC-SLP [phone]  Germain Osgood 08/08/2015,3:59 PM

## 2015-08-08 NOTE — ED Notes (Signed)
Report given to Janett Billow, RN on 3S.

## 2015-08-08 NOTE — ED Notes (Addendum)
This RN walked into the pt's room, and found the pt with her eyes open, reaching in the air. This RN asked the pt how she was doing, and the pt responded that she was tired. Pt was able to answer questions appropriately, and is A&Ox4. Pt reports that she is having a headache. Pt was incontinent of urine.

## 2015-08-08 NOTE — ED Notes (Signed)
Pt's sats dropped to 88% on RA. Pt placed back on 2L nasal cannula.

## 2015-08-09 DIAGNOSIS — G9340 Encephalopathy, unspecified: Secondary | ICD-10-CM

## 2015-08-09 LAB — GLUCOSE, CAPILLARY
GLUCOSE-CAPILLARY: 156 mg/dL — AB (ref 65–99)
Glucose-Capillary: 155 mg/dL — ABNORMAL HIGH (ref 65–99)
Glucose-Capillary: 176 mg/dL — ABNORMAL HIGH (ref 65–99)
Glucose-Capillary: 194 mg/dL — ABNORMAL HIGH (ref 65–99)
Glucose-Capillary: 214 mg/dL — ABNORMAL HIGH (ref 65–99)

## 2015-08-09 MED ORDER — AMLODIPINE BESYLATE 10 MG PO TABS
10.0000 mg | ORAL_TABLET | Freq: Every day | ORAL | Status: DC
  Administered 2015-08-09 – 2015-08-10 (×2): 10 mg via ORAL
  Filled 2015-08-09 (×2): qty 1

## 2015-08-09 MED ORDER — INSULIN ASPART 100 UNIT/ML ~~LOC~~ SOLN
0.0000 [IU] | Freq: Three times a day (TID) | SUBCUTANEOUS | Status: DC
  Administered 2015-08-09: 2 [IU] via SUBCUTANEOUS

## 2015-08-09 MED ORDER — INSULIN ASPART 100 UNIT/ML ~~LOC~~ SOLN: 0.0000 [IU] | Freq: Every day | SUBCUTANEOUS | Status: DC

## 2015-08-09 MED ORDER — CALCIUM CARBONATE 600 MG PO TABS: 600.0000 mg | ORAL_TABLET | Freq: Every day | ORAL | Status: DC

## 2015-08-09 MED ORDER — CALCIUM CARBONATE 1250 (500 CA) MG PO TABS
1.0000 | ORAL_TABLET | Freq: Every day | ORAL | Status: DC
  Administered 2015-08-09 – 2015-08-10 (×2): 500 mg via ORAL
  Filled 2015-08-09 (×2): qty 1

## 2015-08-09 MED ORDER — LABETALOL HCL 100 MG PO TABS
100.0000 mg | ORAL_TABLET | Freq: Two times a day (BID) | ORAL | Status: DC
  Administered 2015-08-09 – 2015-08-10 (×3): 100 mg via ORAL
  Filled 2015-08-09 (×3): qty 1

## 2015-08-09 MED ORDER — EZETIMIBE 10 MG PO TABS
10.0000 mg | ORAL_TABLET | Freq: Every day | ORAL | Status: DC
  Administered 2015-08-09 – 2015-08-10 (×2): 10 mg via ORAL
  Filled 2015-08-09 (×2): qty 1

## 2015-08-09 MED ORDER — POTASSIUM CHLORIDE IN NACL 40-0.9 MEQ/L-% IV SOLN
INTRAVENOUS | Status: DC
  Administered 2015-08-09 – 2015-08-10 (×2): 75 mL/h via INTRAVENOUS
  Filled 2015-08-09 (×3): qty 1000

## 2015-08-09 MED ORDER — POTASSIUM CHLORIDE CRYS ER 20 MEQ PO TBCR
40.0000 meq | EXTENDED_RELEASE_TABLET | Freq: Once | ORAL | Status: AC
  Administered 2015-08-09: 40 meq via ORAL
  Filled 2015-08-09: qty 2

## 2015-08-09 MED ORDER — INSULIN DETEMIR 100 UNIT/ML ~~LOC~~ SOLN
35.0000 [IU] | Freq: Every day | SUBCUTANEOUS | Status: DC
Start: 2015-08-09 — End: 2015-08-10
  Administered 2015-08-09 – 2015-08-10 (×2): 35 [IU] via SUBCUTANEOUS
  Filled 2015-08-09 (×2): qty 0.35

## 2015-08-09 MED ORDER — WHITE PETROLATUM GEL
Status: AC
  Administered 2015-08-09: 0.2
  Filled 2015-08-09: qty 1

## 2015-08-09 MED ORDER — HYDRALAZINE HCL 50 MG PO TABS: 50.0000 mg | ORAL_TABLET | Freq: Three times a day (TID) | ORAL | Status: DC

## 2015-08-09 MED ORDER — LABETALOL HCL 100 MG PO TABS: 100.0000 mg | ORAL_TABLET | Freq: Three times a day (TID) | ORAL | Status: DC

## 2015-08-09 MED ORDER — GABAPENTIN 300 MG PO CAPS
300.0000 mg | ORAL_CAPSULE | Freq: Three times a day (TID) | ORAL | Status: DC
  Administered 2015-08-09 – 2015-08-10 (×5): 300 mg via ORAL
  Filled 2015-08-09 (×5): qty 1

## 2015-08-09 MED ORDER — PRAVASTATIN SODIUM 40 MG PO TABS
80.0000 mg | ORAL_TABLET | Freq: Every day | ORAL | Status: DC
  Administered 2015-08-09: 80 mg via ORAL
  Filled 2015-08-09: qty 1

## 2015-08-09 MED ORDER — INSULIN ASPART 100 UNIT/ML ~~LOC~~ SOLN
0.0000 [IU] | Freq: Three times a day (TID) | SUBCUTANEOUS | Status: DC
  Administered 2015-08-09: 3 [IU] via SUBCUTANEOUS
  Administered 2015-08-09: 5 [IU] via SUBCUTANEOUS
  Administered 2015-08-10: 3 [IU] via SUBCUTANEOUS

## 2015-08-09 MED ORDER — VENLAFAXINE HCL ER 75 MG PO CP24
75.0000 mg | ORAL_CAPSULE | Freq: Three times a day (TID) | ORAL | Status: DC
Start: 2015-08-09 — End: 2015-08-10
  Administered 2015-08-09 – 2015-08-10 (×5): 75 mg via ORAL
  Filled 2015-08-09 (×6): qty 1

## 2015-08-09 NOTE — Progress Notes (Signed)
Patient Demographics:    Destiny Robinson, is a 59 y.o. female, DOB - 1955-10-11, JIR:[id number]  Admit date - 08/08/2015   Admitting Physician Rise Patience, MD  Outpatient Primary MD for the patient is [name], MD  LOS - 1   Chief Complaint  Patient presents with  . Altered Mental Status        Subjective:    Destiny Robinson today in hospital bed, mild generalized headache, no chest or abdominal pain, no shortness of breath or cough. Continues to have left-sided weakness.   Assessment  & Plan :     1. Acute encephalopathy in a patient with known recent intracranial bleed. Nonacute Repeat CT MRI neurology on board, encephalopathy likely due to UTI which is considerably better after empiric antibiotics which will be continued. We will follow cultures. Nonseptic. Likely discharge back to SNF in 1-2 days.   2. Essential hypertension. I have placed her back on some of her home blood pressure medications but at much lower dose will monitor blood pressure and adjust.    3. DM type II. Sliding-scale every before meals at bedtime for now. Continue low-dose Levemir and monitor CBGs.  Lab Results  Component Value Date   HGBA1C 6.7* 07/30/2015    CBG (last 3)   Recent Labs  08/08/15 2004 08/09/15 0009 08/09/15 0742  GLUCAP 165* 194* 176*      4. Dyslipidemia. Statin continue.   5. Hypokalemia. Replaced.   6. Depression. Home medications resumed.     Code Status : Full  Family Communication  : None present  Disposition Plan  : SNF in 1-2 days  Consults  :  Neurology  Procedures  : EEG and MRI brain consistent with known history of intracerebral hemorrhage with mass effect  DVT Prophylaxis  :   SCDs   Lab Results  Component Value Date   PLT 182 08/08/2015     Inpatient Medications  Scheduled Meds: . amLODipine  10 mg Oral Daily  . antiseptic oral rinse  7 mL Mouth Rinse q12n4p  . aztreonam  2 g Intravenous 3 times per day  . calcium carbonate  1 tablet Oral Q breakfast  . chlorhexidine  15 mL Mouth Rinse BID  . ezetimibe  10 mg Oral Daily  . gabapentin  300 mg Oral TID  . insulin aspart  0-9 Units Subcutaneous TID WC  . insulin detemir  20 Units Subcutaneous Daily  . labetalol  100 mg Oral BID  . pravastatin  80 mg Oral q1800  . vancomycin  1,000 mg Intravenous Q12H  . venlafaxine XR  75 mg Oral TID   Continuous Infusions: . 0.9 % NaCl with KCl 40 mEq / L     PRN Meds:.  Antibiotics  :     Anti-infectives    Start     Dose/Rate Route Frequency Ordered Stop   08/09/15 0500  vancomycin (VANCOCIN) IVPB 1000 mg/200 mL premix     1,000 mg 200 mL/hr over 60 Minutes Intravenous Every 12 hours 08/08/15 1427     08/08/15 1430  vancomycin (VANCOCIN) 1,750 mg in sodium chloride 0.9 % 500 mL IVPB     1,750 mg 250 mL/hr over 120 Minutes Intravenous  Once 08/08/15 1424 08/08/15  1708   08/08/15 1400  aztreonam (AZACTAM) 2 g in dextrose 5 % 50 mL IVPB     2 g 100 mL/hr over 30 Minutes Intravenous 3 times per day 08/08/15 0527     08/08/15 1345  acyclovir (ZOVIRAX) 500 mg in dextrose 5 % 100 mL IVPB  Status:  Discontinued     10 mg/kg  50.1 kg (Ideal) 110 mL/hr over 60 Minutes Intravenous 3 times per day 08/08/15 1333 08/08/15 1337   08/08/15 0515  aztreonam (AZACTAM) 2 g in dextrose 5 % 50 mL IVPB     2 g 100 mL/hr over 30 Minutes Intravenous  Once 08/08/15 0511 08/08/15 0605   08/08/15 0330  levofloxacin (LEVAQUIN) IVPB 500 mg     500 mg 100 mL/hr over 60 Minutes Intravenous  Once 08/08/15 0317 08/08/15 0720        Objective:   Filed Vitals:   08/09/15 0359 08/09/15 0521 08/09/15 0605 08/09/15 0700  BP: 164/91 154/84 161/78   Pulse: 84 89 83   Temp: 98 F (36.7 C)   98.2 F (36.8 C)  TempSrc: Oral   Oral  Resp: 17 17 15     Height: 5\' 2"  (1.575 m)     Weight: 85.7 kg (188 lb 15 oz)     SpO2: 94% 94% 95%     Wt Readings from Last 3 Encounters:  08/09/15 85.7 kg (188 lb 15 oz)  08/06/15 87.5 kg (192 lb 14.4 oz)  04/15/15 87.816 kg (193 lb 9.6 oz)     Intake/Output Summary (Last 24 hours) at 08/09/15 0853 Last data filed at 08/09/15 0700  Gross per 24 hour  Intake   2280 ml  Output   3600 ml  Net  -1320 ml     Physical Exam  Awake, alert 2, left-sided hemiparesis with left-sided neglect, answering all questions appropriately .AT,PERRAL Supple Neck,No JVD, No cervical lymphadenopathy appriciated.  Symmetrical Chest wall movement, Good air movement bilaterally, CTAB RRR,No Gallops,Rubs or new Murmurs, No Parasternal Heave +ve B.Sounds, Abd Soft, No tenderness, No organomegaly appriciated, No rebound - guarding or rigidity. No Cyanosis, Clubbing or edema, No new Rash or bruise       Data Review:   Micro Results No results found for this or any previous visit (from the past 240 hour(s)).  Radiology Reports   Ct Head Wo Contrast  08/08/2015  CLINICAL DATA:  Altered mental status. EXAM: CT HEAD WITHOUT CONTRAST TECHNIQUE: Contiguous axial images were obtained from the base of the skull through the vertex without intravenous contrast. COMPARISON:  08/03/2015 FINDINGS: There is slight reduction in volume of the right thalamus hemorrhage compared to the prior studies. The volume of intraventricular blood is not significantly different. The size of the ventricles is unchanged. Mild right to left midline shift is unchanged. There is no new intracranial hemorrhage. No new focal brain abnormality is evident. There is remote lacunar infarction in the caudate nuclei. Basal cisterns remain patent. No acute bony abnormality is evident. IMPRESSION: Slowly decreasing volume of the right bowel is hemorrhage. Unchanged intraventricular hemorrhage. Mild midline shift is also unchanged. No new intracranial  hemorrhage. Electronically Signed   By: Andreas Newport M.D.   On: 08/08/2015 01:32      Mr Brain Wo Contrast  08/08/2015  CLINICAL DATA:  Altered mental status and aphasia beginning 4 hours ago. Baseline of LEFT-sided deficits from prior stroke. Fever. EXAM: MRI HEAD WITHOUT CONTRAST TECHNIQUE: Multiplanar, multiecho pulse sequences of the brain and surrounding structures  were obtained without intravenous contrast. COMPARISON:  CT head August 08, 2015 at 1:20 a.m. and MRI of the head October 01, 2014 FINDINGS: No reduced diffusion to suggest acute ischemia. Spurious reduced diffusion within the RIGHT thalamic hemorrhage, with faint peripheral susceptibility artifact, rim of T1 shortening. RIGHT thalamus 3.9 x 3.2 cm hematoma with surrounding T2 bright vasogenic edema results in local mass-effect, partially effacing the third ventricle. Increasing ventriculomegaly, compatible with mild hydrocephalus. Layering susceptibility artifact in the occipital horns with moderate ventriculomegaly. Moderate global parenchymal brain volume loss for age. Additional scattered micro hemorrhages predominantly in the periphery of the brain and cerebellum, similar. Small amount of extra-axial hemosiderin staining compatible with prior subarachnoid hemorrhage. Innumerable tiny T2 hyperintensities in the basal ganglia favor perivascular spaces. In addition, old bilateral basal ganglia lacunar infarcts. No abnormal extra-axial fluid collections. dolicoectatic intracranial vascular flow voids. Ocular globes and orbital contents are unremarkable. Paranasal sinuses are well aerated. Trace LEFT mastoid effusion. Severe LEFT temporomandibular osteoarthrosis. No abnormal calvarial bone marrow signal. Patient is edentulous. IMPRESSION: No acute infarct. Evolving subacute RIGHT thalamic intraparenchymal hemorrhage. Persistent mass effect on the third ventricle with intraventricular extension and mild hydrocephalus. Similar micro  hemorrhages most compatible with chronic hypertension, corroborated by dolicoectatic intracranial vessels. Evidence of old subarachnoid hemorrhage/redistributed blood products. Mild parenchymal brain volume loss for age. Fall bilateral basal ganglia lacunar infarcts. Electronically Signed   By: Elon Alas M.D.   On: 08/08/2015 04:52    Dg Chest Port 1 View  08/08/2015  CLINICAL DATA:  Fever, altered mental status. History of hypertension, bronchitis, diabetes, cancer. EXAM: PORTABLE CHEST 1 VIEW COMPARISON:  Chest radiograph August 04, 2015 FINDINGS: Cardiac silhouette is upper limits of normal in size, even with consideration to this low inspiratory portable examination with crowded vascular markings. Tortuous aorta. No pleural effusion or focal consolidation. Patient rotated to the RIGHT. No pneumothorax. Soft tissue planes and included osseous structures are nonsuspicious. Moderate degenerative change of the thoracic spine. IMPRESSION: Borderline cardiomegaly, no acute pulmonary process in this low inspiratory portable examination. Electronically Signed   By: Elon Alas M.D.   On: 08/08/2015 00:43        CBC  Recent Labs Lab 08/03/15 0319 08/04/15 0300 08/05/15 0340 08/08/15 0022 08/08/15 1530  WBC 9.8 9.8 8.9 9.7 8.7  HGB 14.5 14.1 12.8 13.0 12.6  HCT 44.0 43.1 39.7 40.2 39.0  PLT 194 173 170 194 182  MCV 87.3 88.0 88.4 88.4 89.0  MCH 28.8 28.8 28.5 28.6 28.8  MCHC 33.0 32.7 32.2 32.3 32.3  RDW 13.9 13.7 13.8 14.0 13.9  LYMPHSABS  --   --   --  2.0 2.4  MONOABS  --   --   --  0.7 0.6  EOSABS  --   --   --  0.1 0.1  BASOSABS  --   --   --  0.1 0.0    Chemistries   Recent Labs Lab 08/04/15 0300 08/04/15 0750 08/05/15 0340 08/08/15 0022 08/08/15 1530  NA 142 141 142 142 140  K 2.7* 3.2* 3.6 3.7 3.4*  CL 104 106 105 105 103  CO2 27 25 28  21* 29  GLUCOSE 140* 169* 179* 181* 165*  BUN 6 6 7 14 8   CREATININE 0.44 0.47 0.50 0.57 0.45  CALCIUM 9.1 8.9 9.2 9.5  9.0  AST 24  --  21 22 23   ALT 20  --  17 18 19   ALKPHOS 50  --  50 53 50  BILITOT  0.6  --  0.4 0.5 0.8   ------------------------------------------------------------------------------------------------------------------ estimated creatinine clearance is 79.7 mL/min (by C-G formula based on Cr of 0.45). ------------------------------------------------------------------------------------------------------------------ No results for input(s): HGBA1C in the last 72 hours. ------------------------------------------------------------------------------------------------------------------ No results for input(s): CHOL, HDL, LDLCALC, TRIG, CHOLHDL, LDLDIRECT in the last 72 hours. ------------------------------------------------------------------------------------------------------------------  Recent Labs  08/08/15 0655  TSH 0.569   ------------------------------------------------------------------------------------------------------------------ No results for input(s): VITAMINB12, FOLATE, FERRITIN, TIBC, IRON, RETICCTPCT in the last 72 hours.  Coagulation profile  Recent Labs Lab 08/08/15 0022 08/08/15 1530  INR 0.95 1.05    No results for input(s): DDIMER in the last 72 hours.  Cardiac Enzymes No results for input(s): CKMB, TROPONINI, MYOGLOBIN in the last 168 hours.  Invalid input(s): CK ------------------------------------------------------------------------------------------------------------------ Invalid input(s): POCBNP   Time Spent in minutes   35   Katleen Carraway K M.D on 08/09/2015 at 8:53 AM  Between 7am to 7pm - Pager - [phone]  After 7pm go to [email or web address] - password [facility]  [facility] -  Office  [phone]

## 2015-08-09 NOTE — Progress Notes (Signed)
Pt arrived from 3S via bed. Report received from Consuela Mimes, RN. IV intact infusing at 85ml/hr. Vital signs complete. Pt repositioned for PT to help with lunch.

## 2015-08-09 NOTE — Progress Notes (Signed)
Speech Language Pathology Treatment: Dysphagia  Patient Details Name: BENNYE NIX MRN: [id number] DOB: [birth year] Today's Date: 08/09/2015 Time: 1350-1430 SLP Time Calculation (min) (ACUTE ONLY): 40 min  Assessment / Plan / Recommendation Clinical Impression  Pt tolerating thin liquid and puree solids with no signs of aspiration. Pt repositioned in bed to facilitate attention to midline, tray placed with items at midline or left of midline; SLP provided max fading to moderate verbal cues to direct attention to items on tray. Pt was able to self feed and clear mild left buccal residuals. She verbalized desire for "real fruit" and demonstrated ability to masticate peaches from a fruit cup. Pt is tired of pureed diet and given function demonstrated today, feel pt could tolerate dys 2 (minced) diet. Will f/u for tolerance.    HPI Other Pertinent Information: CELISSE CIULLA is a 59 y.o. female who was discharged 11/2 after being managed for hemorrhagic CVA, hypertensive emergency. She was brought to the ER 11/3 from SNF for AMS, fever. CT Head and MRI Brain without acute changes, UA concerning for UTI.   Pertinent Vitals    SLP Plan       Recommendations Diet recommendations: Dysphagia 2 (fine chop);Thin liquid Liquids provided via: Cup Medication Administration: Crushed with puree Supervision: Full supervision/cueing for compensatory strategies Compensations: Slow rate;Small sips/bites;Minimize environmental distractions;Check for pocketing Postural Changes and/or Swallow Maneuvers: Seated upright 90 degrees;Upright 30-60 min after meal              Oral Care Recommendations: Oral care BID;Oral care before and after PO    GO   Herbie Baltimore, MA CCC-SLP [phone]  Lynann Beaver 08/09/2015, 2:47 PM

## 2015-08-09 NOTE — NC FL2 (Signed)
[place] LEVEL OF CARE SCREENING TOOL     IDENTIFICATION  Patient Name: Destiny Robinson Birthdate: Mar 03, 1956 Sex: female Admission Date (Current Location): 08/08/2015  [facility] and [place] Number:     Facility and Address:  [facility], [place] [address], [place], [place] [address]      Provider Number: [id number]  Attending Physician Name and Address:  Thurnell Lose, MD  Relative Name and Phone Number:       Current Level of Care: Hospital Recommended Level of Care: [place] Prior Approval Number:    Date Approved/Denied:   PASRR Number: [id number]  [facility] [phone])  Discharge Plan: SNF    Current Diagnoses: Patient Active Problem List   Diagnosis Date Noted  . Altered mental status 08/08/2015  . Acute encephalopathy 08/08/2015  . Type 2 diabetes mellitus with vascular disease ([place]) 08/08/2015  . UTI (lower urinary tract infection)   . Other specified fever   . Cough   . Malignant hypertension   . ICH (intracerebral hemorrhage) ([place]) 07/29/2015  . Migraine with aura and without status migrainosus, not intractable 04/15/2015  . Cephalalgia 12/11/2014  . Type 2 diabetes mellitus with other circulatory complications ([place]) 33/35/4562  . Headache 10/30/2014  . Left sided numbness 10/30/2014  . HLD (hyperlipidemia) 10/30/2014  . OSA on CPAP 10/30/2014  . Obesity 10/30/2014  . Essential hypertension   . Left hip pain   . Sensory disturbance 10/02/2014  . Morbid obesity ([place]) 10/02/2014  . Cerebrovascular accident ([place]) 10/01/2014  . CVA (cerebral infarction) 10/01/2014  . Acute bronchitis 05/14/2013  . GERD (gastroesophageal reflux disease) 04/22/2013  . Obstructive sleep apnea 11/19/2012  . Dyspnea on exertion 11/19/2012  . Abdominal pain 12/21/2011  . GIB (gastrointestinal bleeding) 12/21/2011  . Soft tissue infection 12/21/2011  . Depression 12/11/2011  . Nausea, vomiting and diarrhea 12/10/2011  .  HTN (hypertension) 12/10/2011  . Diabetes mellitus, type 2 ([place]) 12/10/2011  . Pancreatitis     Orientation ACTIVITIES/SOCIAL BLADDER RESPIRATION    Self, Situation  Family supportive Incontinent O2 (As needed)  BEHAVIORAL SYMPTOMS/MOOD NEUROLOGICAL BOWEL NUTRITION STATUS      Incontinent Diet (DYS 1 )  PHYSICIAN VISITS COMMUNICATION OF NEEDS Height & Weight Skin    Verbally 5\' 2"  (157.5 cm) 192 lbs. Normal          AMBULATORY STATUS RESPIRATION    Assist extensive O2 (As needed)      Personal Care Assistance Level of Assistance  Bathing, Feeding, Dressing Bathing Assistance: Maximum assistance Feeding assistance: Maximum assistance Dressing Assistance: Maximum assistance      Functional Limitations Info                SPECIAL CARE FACTORS FREQUENCY  OT (By licensed OT), Speech therapy, PT (By licensed PT)                   Additional Factors Info  Code Status Code Status Info: Full  Allergies Info: Codeine, Penicillins, Reglan, Xifaxan, Zocor, Crestor           Current Medications (08/09/2015): Current Facility-Administered Medications  Medication Dose Route Frequency Provider Last Rate Last Dose  . 0.9 % NaCl with KCl 40 mEq / L  infusion   Intravenous Continuous Thurnell Lose, MD      . acetaminophen (TYLENOL) tablet 650 mg  650 mg Oral Q6H PRN Rise Patience, MD   650 mg at 08/09/15 5638   Or  .  acetaminophen (TYLENOL) suppository 650 mg  650 mg Rectal Q6H PRN Rise Patience, MD   650 mg at 08/08/15 1311  . albuterol (PROVENTIL) (2.5 MG/3ML) 0.083% nebulizer solution 2.5 mg  2.5 mg Inhalation Q4H PRN Rise Patience, MD      . amLODipine (NORVASC) tablet 10 mg  10 mg Oral Daily Thurnell Lose, MD   10 mg at 08/09/15 0902  . antiseptic oral rinse (CPC / CETYLPYRIDINIUM CHLORIDE 0.05%) solution 7 mL  7 mL Mouth Rinse q12n4p Thurnell Lose, MD   7 mL at 08/08/15 1730  . aztreonam (AZACTAM) 2 g in dextrose 5 % 50 mL IVPB  2 g  Intravenous 3 times per day [facility] Masters, [facility]   2 g at 08/09/15 0537  . calcium carbonate (OS-CAL - dosed in mg of elemental calcium) tablet 500 mg of elemental calcium  1 tablet Oral Q breakfast Thurnell Lose, MD   500 mg of elemental calcium at 08/09/15 0902  . chlorhexidine (PERIDEX) 0.12 % solution 15 mL  15 mL Mouth Rinse BID Thurnell Lose, MD   15 mL at 08/09/15 0905  . ezetimibe (ZETIA) tablet 10 mg  10 mg Oral Daily Thurnell Lose, MD   10 mg at 08/09/15 0901  . gabapentin (NEURONTIN) capsule 300 mg  300 mg Oral TID Thurnell Lose, MD      . hydrALAZINE (APRESOLINE) injection 10 mg  10 mg Intravenous Q4H PRN Rise Patience, MD   10 mg at 08/09/15 0416  . insulin aspart (novoLOG) injection 0-15 Units  0-15 Units Subcutaneous TID WC Thurnell Lose, MD      . insulin aspart (novoLOG) injection 0-5 Units  0-5 Units Subcutaneous QHS Thurnell Lose, MD      . insulin detemir (LEVEMIR) injection 35 Units  35 Units Subcutaneous Daily Thurnell Lose, MD      . labetalol (NORMODYNE) tablet 100 mg  100 mg Oral BID Thurnell Lose, MD   100 mg at 08/09/15 0900  . ondansetron (ZOFRAN) tablet 4 mg  4 mg Oral Q6H PRN Rise Patience, MD   4 mg at 08/08/15 2244   Or  . ondansetron (ZOFRAN) injection 4 mg  4 mg Intravenous Q6H PRN Rise Patience, MD   4 mg at 08/08/15 1827  . pravastatin (PRAVACHOL) tablet 80 mg  80 mg Oral q1800 Thurnell Lose, MD      . vancomycin (VANCOCIN) IVPB 1000 mg/200 mL premix  1,000 mg Intravenous Q12H Thurnell Lose, MD   1,000 mg at 08/09/15 0349  . venlafaxine XR (EFFEXOR-XR) 24 hr capsule 75 mg  75 mg Oral TID Thurnell Lose, MD       Do not use this list as official medication orders. Please verify with discharge summary.  Discharge Medications:   Medication List    ASK your doctor about these medications        acetaminophen 500 MG tablet  Commonly known as:  TYLENOL  Take 1,000 mg by mouth daily as needed for fever or  headache (headache).     amLODipine 10 MG tablet  Commonly known as:  NORVASC  Take 1 tablet (10 mg total) by mouth daily.     calcium carbonate 600 MG Tabs tablet  Commonly known as:  OS-CAL  Take 600 mg by mouth daily.     Co Q-10 100 MG Caps  Take 100 mg by mouth at bedtime.  ezetimibe 10 MG tablet  Commonly known as:  ZETIA  Take 1 tablet (10 mg total) by mouth daily.     fluticasone 50 MCG/ACT nasal spray  Commonly known as:  FLONASE  Place 2 sprays into both nostrils daily as needed for allergies.     gabapentin 300 MG capsule  Commonly known as:  NEURONTIN  Take 2 capsules (600 mg total) by mouth 3 (three) times daily.     hydrALAZINE 50 MG tablet  Commonly known as:  APRESOLINE  Take 1 tablet (50 mg total) by mouth every 8 (eight) hours.     hydroxypropyl cellulose 5 MG Inst  Place 5 mg into both eyes 3 (three) times daily.     insulin detemir 100 UNIT/ML injection  Commonly known as:  LEVEMIR  Inject 0.4 mLs (40 Units total) into the skin daily.     labetalol 100 MG tablet  Commonly known as:  NORMODYNE  Take 1 tablet (100 mg total) by mouth 3 (three) times daily.     lisinopril 20 MG tablet  Commonly known as:  PRINIVIL,ZESTRIL  Take 1 tablet (20 mg total) by mouth 2 (two) times daily.     LORazepam 0.5 MG tablet  Commonly known as:  ATIVAN  Take 1 tablet (0.5 mg total) by mouth 4 (four) times daily. FOR ANXIETY     multivitamin with minerals Tabs tablet  Take 1 tablet by mouth daily.     potassium chloride 10 MEQ tablet  Commonly known as:  K-DUR,KLOR-CON  Take 1 tablet (10 mEq total) by mouth 2 (two) times daily.     pravastatin 80 MG tablet  Commonly known as:  PRAVACHOL  Take 1 tablet (80 mg total) by mouth daily.     PROAIR HFA 108 (90 BASE) MCG/ACT inhaler  Generic drug:  albuterol  USE 2 PUFFS EVERY 6 HOURS AS NEEDED FOR WHEEZING     senna-docusate 8.6-50 MG tablet  Commonly known as:  Senokot-S  Take 1 tablet by mouth 2 (two) times  daily.     venlafaxine XR 75 MG 24 hr capsule  Commonly known as:  EFFEXOR-XR  Take 75 mg by mouth 3 (three) times daily.     VITAMIN D PO  Take 400 Units by mouth daily.        Relevant Imaging Results:  Relevant Lab Results:  Recent Labs    Additional Information    Cotey Rakes, LCSW

## 2015-08-09 NOTE — Progress Notes (Signed)
Subjective: Significant improvement since yesterday.   Exam: Filed Vitals:   08/09/15 0700  BP:   Pulse:   Temp: 98.2 F (36.8 C)  Resp:    Gen: In bed, NAD Resp: non-labored breathing, no acute distress Abd: soft, nt  Neuro: MS: Awake, alert, oriented to year, place, not month.  FF:MBWGY gaze preference.  Motor: SEvere left hemipareisis. Good strength on right.   Pertinent Labs: UA - suspicious for UTI  Impression: 59 yo M with recent ICH and TME due to UTI. She has responded well to antibiotics. No signs of seizure.   Recommendations: 1) Please call with any further questions or concerns.   Roland Rack, MD [facility] [phone]  If 7pm- 7am, please page neurology on call as listed in [place].

## 2015-08-09 NOTE — Progress Notes (Signed)
Pt to [facility] to 5W-03, VSS, called report, family aware of Alpha

## 2015-08-09 NOTE — Clinical Social Work Note (Signed)
CSW completed an full assessment on 08/07/2015. CSW discharged the pt to [facility] on 08/07/2015. CSW spoke with the pt's sister Santiago Glad. Santiago Glad informed the CSW that she would like the pt to return back to [facility]. CSW spoke with Crystal to confirmed that the pt can return once medically stable.    Clinical Social Worker  Lacreasha Hinds, MSW, LCSW [phone]

## 2015-08-10 LAB — CBC
HCT: 39.5 % (ref 36.0–46.0)
Hemoglobin: 12.8 g/dL (ref 12.0–15.0)
MCH: 28.6 pg (ref 26.0–34.0)
MCHC: 32.4 g/dL (ref 30.0–36.0)
MCV: 88.4 fL (ref 78.0–100.0)
PLATELETS: 191 10*3/uL (ref 150–400)
RBC: 4.47 MIL/uL (ref 3.87–5.11)
RDW: 13.8 % (ref 11.5–15.5)
WBC: 8.7 10*3/uL (ref 4.0–10.5)

## 2015-08-10 LAB — URINE CULTURE: Culture: >=100000

## 2015-08-10 LAB — GLUCOSE, CAPILLARY
Glucose-Capillary: 188 mg/dL — ABNORMAL HIGH (ref 65–99)
Glucose-Capillary: 134 mg/dL — ABNORMAL HIGH (ref 65–99)

## 2015-08-10 LAB — BASIC METABOLIC PANEL
ANION GAP: 9 (ref 5–15)
BUN: 9 mg/dL (ref 6–20)
CALCIUM: 9.5 mg/dL (ref 8.9–10.3)
CO2: 28 mmol/L (ref 22–32)
CREATININE: 0.4 mg/dL — AB (ref 0.44–1.00)
Chloride: 103 mmol/L (ref 101–111)
GFR calc Af Amer: >60 mL/min (ref >60)
GFR calc non Af Amer: >60 mL/min (ref >60)
Glucose, Bld: 154 mg/dL — ABNORMAL HIGH (ref 65–99)
Potassium: 4 mmol/L (ref 3.5–5.1)
Sodium: 140 mmol/L (ref 135–145)

## 2015-08-10 LAB — MAGNESIUM: Magnesium: 1.9 mg/dL (ref 1.7–2.4)

## 2015-08-10 MED ORDER — INSULIN ASPART 100 UNIT/ML ~~LOC~~ SOLN
0.0000 [IU] | Freq: Three times a day (TID) | SUBCUTANEOUS | Status: DC
  Administered 2015-08-10: 2 [IU] via SUBCUTANEOUS

## 2015-08-10 MED ORDER — LISINOPRIL 10 MG PO TABS: 10.0000 mg | ORAL_TABLET | Freq: Two times a day (BID) | ORAL | Status: DC

## 2015-08-10 MED ORDER — DOXYCYCLINE HYCLATE 100 MG PO CAPS: 100.0000 mg | ORAL_CAPSULE | Freq: Two times a day (BID) | ORAL | Status: DC

## 2015-08-10 MED ORDER — LABETALOL HCL 100 MG PO TABS: 100.0000 mg | ORAL_TABLET | Freq: Two times a day (BID) | ORAL | Status: AC

## 2015-08-10 MED ORDER — INSULIN ASPART 100 UNIT/ML ~~LOC~~ SOLN: 0.0000 [IU] | Freq: Three times a day (TID) | SUBCUTANEOUS | Status: DC

## 2015-08-10 NOTE — Progress Notes (Signed)
Pt prepared for d/c to SNF. IV d/c'd. Skin intact except as charted in most recent assessments. Vitals are stable. Report called to receiving facility. Pt to be transported by ambulance service. 

## 2015-08-10 NOTE — Discharge Instructions (Signed)
Follow with Primary MD [name], MD in 7 days   Get CBC, CMP, 2 view Chest X ray checked  by Primary MD next visit.    Activity: As tolerated with Full fall precautions use walker/cane & assistance as needed   Disposition SNF     Diet: Dysphagia 2 Heart Healthy Low Carb diet with full feeding assistance and aspiration precautions.  Accuchecks 4 times/day, Once in AM empty stomach and then before each meal. Log in all results and show them to your Prim.MD in 3 days. If any glucose reading is under 80 or above 300 call your Prim MD immidiately. Follow Low glucose instructions for glucose under 80 as instructed.    For Heart failure patients - Check your Weight same time everyday, if you gain over 2 pounds, or you develop in leg swelling, experience more shortness of breath or chest pain, call your Primary MD immediately. Follow Cardiac Low Salt Diet and 1.5 lit/day fluid restriction.   On your next visit with your primary care physician please Get Medicines reviewed and adjusted.   Please request your Prim.MD to go over all Hospital Tests and Procedure/Radiological results at the follow up, please get all Hospital records sent to your Prim MD by signing hospital release before you go home.   If you experience worsening of your admission symptoms, develop shortness of breath, life threatening emergency, suicidal or homicidal thoughts you must seek medical attention immediately by calling 911 or calling your MD immediately  if symptoms less severe.  You Must read complete instructions/literature along with all the possible adverse reactions/side effects for all the Medicines you take and that have been prescribed to you. Take any new Medicines after you have completely understood and accpet all the possible adverse reactions/side effects.   Do not drive, operating heavy machinery, perform activities at heights, swimming or participation in water activities or provide baby  sitting services if your were admitted for syncope or siezures until you have seen by Primary MD or a Neurologist and advised to do so again.  Do not drive when taking Pain medications.    Do not take more than prescribed Pain, Sleep and Anxiety Medications  Special Instructions: If you have smoked or chewed Tobacco  in the last 2 yrs please stop smoking, stop any regular Alcohol  and or any Recreational drug use.  Wear Seat belts while driving.   Please note  You were cared for by a hospitalist during your hospital stay. If you have any questions about your discharge medications or the care you received while you were in the hospital after you are discharged, you can call the unit and asked to speak with the hospitalist on call if the hospitalist that took care of you is not available. Once you are discharged, your primary care physician will handle any further medical issues. Please note that NO REFILLS for any discharge medications will be authorized once you are discharged, as it is imperative that you return to your primary care physician (or establish a relationship with a primary care physician if you do not have one) for your aftercare needs so that they can reassess your need for medications and monitor your lab values.

## 2015-08-10 NOTE — Progress Notes (Signed)
Speech Language Pathology Treatment: Dysphagia  Patient Details Name: Destiny Robinson MRN: [id number] DOB: 1956/03/30 Today's Date: 08/10/2015 Time: 8811-0315 SLP Time Calculation (min) (ACUTE ONLY): 10 min  Assessment / Plan / Recommendation Clinical Impression  Dysphagia treatment provided for diet tolerance check of upgraded diet. Pt demonstrated no overt s/s of aspiration of dysphagia 2/ thin liquid consistencies. She needed regular cues to swallow again/ remove pocketed material on L side with tongue- able to follow directions with visual cue without difficulty. Aspiration risk appears mild with this diet. Recommend continuing dysphagia 2, thin liquids, meds crushed with puree. Full supervision- assist with feeding, ensure pt is fully upright, check after each bite for pocketing. Will continue to follow while pt in hospital to ensure tolerance.   HPI Other Pertinent Information: Destiny Robinson is a 59 y.o. female who was discharged 11/2 after being managed for hemorrhagic CVA, hypertensive emergency. She was brought to the ER 11/3 from SNF for AMS, fever. CT Head and MRI Brain without acute changes, UA concerning for UTI.   Pertinent Vitals Pain Assessment: No/denies pain  SLP Plan  Continue with current plan of care    Recommendations Diet recommendations: Dysphagia 2 (fine chop);Thin liquid Liquids provided via: Cup;Straw Medication Administration: Crushed with puree Supervision: Full supervision/cueing for compensatory strategies Compensations: Slow rate;Small sips/bites;Minimize environmental distractions;Check for pocketing Postural Changes and/or Swallow Maneuvers: Seated upright 90 degrees;Upright 30-60 min after meal              Oral Care Recommendations: Oral care BID;Oral care before and after PO Follow up Recommendations: Skilled Nursing facility Plan: Continue with current plan of care    [place], Paden Senger K, [place], CCC-SLP 08/10/2015, 12:10 PM [phone]

## 2015-08-10 NOTE — Care Management Note (Signed)
Case Management Note  Patient Details  Name: Destiny Robinson MRN: [id number] Date of Birth: 03/16/1956  Subjective/Objective:                   AMs Action/Plan: Discharge planning  Expected Discharge Date:                  Expected Discharge Plan:  [place]  In-House Referral:     Discharge planning Services  CM Consult  Post Acute Care Choice:    Choice offered to:     DME Arranged:    DME Agency:     HH Arranged:    [place] Agency:     Status of Service:     Medicare Important Message Given:    Date Medicare IM Given:    Medicare IM give by:    Date Additional Medicare IM Given:    Additional Medicare Important Message give by:     If discussed at [place] of Stay Meetings, dates discussed:    Additional Comments: Cm notes pt to go back to SNF; CSW arranging.  No other CM needs were communicated. Dellie Catholic, RN 08/10/2015, 10:55 AM

## 2015-08-10 NOTE — Discharge Summary (Signed)
Destiny Robinson, is a 59 y.o. female  DOB January 21, 1956  MRN [id number].  Admission date:  08/08/2015  Admitting Physician  Rise Patience, MD  Discharge Date:  08/10/2015   Primary MD  [name], MD  Recommendations for primary care physician for things to follow:   Check CBC, BMP in 3-4 days.  In the monitor bladder emptying closely.   Admission Diagnosis  UTI (lower urinary tract infection) [N39.0] History of intracranial hemorrhage [Z86.79] Other specified fever [R50.9] Altered mental status, unspecified altered mental status type [R41.82]   Discharge Diagnosis  UTI (lower urinary tract infection) [N39.0] History of intracranial hemorrhage [Z86.79] Other specified fever [R50.9] Altered mental status, unspecified altered mental status type [R41.82]     Principal Problem:   Acute encephalopathy Active Problems:   Cerebrovascular accident [facility])   Essential hypertension   HLD (hyperlipidemia)   Type 2 diabetes mellitus with vascular disease ([place])      Past Medical History  Diagnosis Date  . Hypertension   . Hypercholesterolemia   . Bronchitis   . Pancreatitis   . Complication of anesthesia 12/11/11    "angry, mean, hateful after" endoscopy & colonoscopy  . Asthma ~ 1991  . Pneumonia 06/2011; 07/2011  . Diabetes mellitus type 2, controlled ([place])   . GERD (gastroesophageal reflux disease)   . GI bleeding 12/21/11  . Hx of colonoscopy with polypectomy 12/11/11    "took out 7; couldn't get #8, that one was precancerous"  . Depression   . Cancer 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 Paralysis gastric   . Kidney stones   . Sleep apnea     uses a cpap-oxygen at night  . Stroke [facility])     Past Surgical History  Procedure Laterality Date  . Cesarean  section  1981; 1986  . Esophagogastroduodenoscopy  12/11/2011    Procedure: ESOPHAGOGASTRODUODENOSCOPY (EGD);  Surgeon: Missy Sabins, MD;  Location: [facility] ENDOSCOPY;  Service: Endoscopy;  Laterality: N/A;  . Colonoscopy  12/11/2011    Procedure: COLONOSCOPY;  Surgeon: Missy Sabins, MD;  Location: [place];  Service: Endoscopy;  Laterality: N/A;  . Abdominal hysterectomy  2002  . Cholecystectomy  ~ 2002  . Tonsillectomy and adenoidectomy  1988  . Appendectomy  ~ 2002    "w/hysterectomy"  . Colonoscopy  04/26/2012    Procedure: COLONOSCOPY;  Surgeon: Wonda Horner, MD;  Location: [facility] ENDOSCOPY;  Service: Endoscopy;  Laterality: N/A;  apc  . Hernia repair      Umbilical  . Open reduction internal fixation (orif) distal radial fracture Left 01/07/2015    Procedure: OPEN REDUCTION INTERNAL FIXATION (ORIF) LEFT DISTAL RADIAL FRACTURE;  Surgeon: Milly Jakob, MD;  Location: [place];  Service: Orthopedics;  Laterality: Left;  . Knee arthroscopy with medial menisectomy Right 03/29/2015    Procedure: ARTHROSCOPY KNEE, medial menisectomy, removal of loose body;  Surgeon: Frederik Pear, MD;  Location: [place];  Service: Orthopedics;  Laterality: Right;  RIGHT KNEE ARTHROSCOPY       HPI  from the history and physical done on the day of admission:    Destiny Robinson is a 59 y.o. female who was discharged yesterday after being managed for hemorrhagic CVA, hypertensive emergency was brought to the ER from the nursing after patient was found to have difficulty talking, confusion and fever. In the ER patient was initially communicative and was oriented to name and following commands as per the ER physician but soon became drowsy and confused. CT of the head and MRI brain shows nothing acute. UA shows features concerning for UTI. Chest x-ray was unremarkable. On-call neurologist Dr. Janann Colonel was consulted and patient has been admitted for acute encephalopathy. Patient on my exam is not  responsive but is able to protect airways. I'm unable to reach patient's family to get further history. As per ER physician patient also had some cough.      Hospital Course:    1. Acute encephalopathy in a patient with known recent intracranial bleed. Nonacute Repeat CT MRI neurology on board, encephalopathy was due to UTI which is considerably better after empiric antibiotics which will be continued. Urine cultures showed more than 100,000 coag-negative staph on blood cultures negative, case discussed with ID physician Dr. Michel Bickers who agrees with transition to oral doxycycline, will provide for 7 more days and then stop. Currently monitor bladder emptying at SNF. Patient is close to her baseline at this point.  Due to her recent stroke and intracranial bleed or long-term prognosis is poor, she has dense left-sided hemiparesis and remains at risk for aspiration, recurrent UTI and decubitus ulcers.   2. Essential hypertension. I have placed her back on some of her home blood pressure medications, have adjusted medications today, continue to monitor blood pressure and adjust meds as needed. Target SBP of 120-140.   3. DM type II. Sliding-scale every before meals at bedtime for now. Continue low-dose Levemir and monitor CBGs.  Lab Results  Component Value Date   HGBA1C 6.7* 07/30/2015    CBG (last 3)   Recent Labs  08/09/15 1642 08/09/15 2123 08/10/15 0737  GLUCAP 156* 155* 188*    4. Dyslipidemia. Statin continue.   5. Hypokalemia. Replaced.   6. Depression. Home medications resumed.    Discharge Condition: Fair  Follow UP     Consults obtained - neurology  Diet and Activity recommendation: See Discharge Instructions below  Discharge Instructions           Discharge Instructions    Discharge instructions    Complete by:  As directed   Follow with Primary MD [name], MD in 7 days   Get CBC, CMP, 2 view Chest X ray checked  by Primary MD  next visit.    Activity: As tolerated with Full fall precautions use walker/cane & assistance as needed   Disposition SNF     Diet: Dysphagia 2 Heart Healthy Low Carb diet with full feeding assistance and aspiration precautions.  Accuchecks 4 times/day, Once in AM empty stomach and then before each meal. Log in all results and show them to your Prim.MD in 3 days. If any glucose reading is under 80 or above 300 call your Prim MD immidiately. Follow Low glucose instructions for glucose under 80 as instructed.    For Heart failure patients - Check your Weight same time everyday, if you gain over 2 pounds, or you develop in leg swelling, experience more shortness of breath  or chest pain, call your Primary MD immediately. Follow Cardiac Low Salt Diet and 1.5 lit/day fluid restriction.   On your next visit with your primary care physician please Get Medicines reviewed and adjusted.   Please request your Prim.MD to go over all Hospital Tests and Procedure/Radiological results at the follow up, please get all Hospital records sent to your Prim MD by signing hospital release before you go home.   If you experience worsening of your admission symptoms, develop shortness of breath, life threatening emergency, suicidal or homicidal thoughts you must seek medical attention immediately by calling 911 or calling your MD immediately  if symptoms less severe.  You Must read complete instructions/literature along with all the possible adverse reactions/side effects for all the Medicines you take and that have been prescribed to you. Take any new Medicines after you have completely understood and accpet all the possible adverse reactions/side effects.   Do not drive, operating heavy machinery, perform activities at heights, swimming or participation in water activities or provide baby sitting services if your were admitted for syncope or siezures until you have seen by Primary MD or a Neurologist and  advised to do so again.  Do not drive when taking Pain medications.    Do not take more than prescribed Pain, Sleep and Anxiety Medications  Special Instructions: If you have smoked or chewed Tobacco  in the last 2 yrs please stop smoking, stop any regular Alcohol  and or any Recreational drug use.  Wear Seat belts while driving.   Please note  You were cared for by a hospitalist during your hospital stay. If you have any questions about your discharge medications or the care you received while you were in the hospital after you are discharged, you can call the unit and asked to speak with the hospitalist on call if the hospitalist that took care of you is not available. Once you are discharged, your primary care physician will handle any further medical issues. Please note that NO REFILLS for any discharge medications will be authorized once you are discharged, as it is imperative that you return to your primary care physician (or establish a relationship with a primary care physician if you do not have one) for your aftercare needs so that they can reassess your need for medications and monitor your lab values.     Increase activity slowly    Complete by:  As directed              Discharge Medications       Medication List    TAKE these medications        acetaminophen 500 MG tablet  Commonly known as:  TYLENOL  Take 1,000 mg by mouth daily as needed for fever or headache (headache).     amLODipine 10 MG tablet  Commonly known as:  NORVASC  Take 1 tablet (10 mg total) by mouth daily.     calcium carbonate 600 MG Tabs tablet  Commonly known as:  OS-CAL  Take 600 mg by mouth daily.     Co Q-10 100 MG Caps  Take 100 mg by mouth at bedtime.     doxycycline 100 MG capsule  Commonly known as:  VIBRAMYCIN  Take 1 capsule (100 mg total) by mouth 2 (two) times daily. For 7 more days     ezetimibe 10 MG tablet  Commonly known as:  ZETIA  Take 1 tablet (10 mg total) by  mouth daily.  fluticasone 50 MCG/ACT nasal spray  Commonly known as:  FLONASE  Place 2 sprays into both nostrils daily as needed for allergies.     gabapentin 300 MG capsule  Commonly known as:  NEURONTIN  Take 2 capsules (600 mg total) by mouth 3 (three) times daily.     hydrALAZINE 50 MG tablet  Commonly known as:  APRESOLINE  Take 1 tablet (50 mg total) by mouth every 8 (eight) hours.     hydroxypropyl cellulose 5 MG Inst  Place 5 mg into both eyes 3 (three) times daily.     insulin aspart 100 UNIT/ML injection  Commonly known as:  novoLOG  Inject 0-15 Units into the skin 3 (three) times daily with meals.     insulin detemir 100 UNIT/ML injection  Commonly known as:  LEVEMIR  Inject 0.4 mLs (40 Units total) into the skin daily.     labetalol 100 MG tablet  Commonly known as:  NORMODYNE  Take 1 tablet (100 mg total) by mouth 2 (two) times daily.     lisinopril 10 MG tablet  Commonly known as:  PRINIVIL,ZESTRIL  Take 1 tablet (10 mg total) by mouth 2 (two) times daily.     LORazepam 0.5 MG tablet  Commonly known as:  ATIVAN  Take 1 tablet (0.5 mg total) by mouth 4 (four) times daily. FOR ANXIETY     multivitamin with minerals Tabs tablet  Take 1 tablet by mouth daily.     potassium chloride 10 MEQ tablet  Commonly known as:  K-DUR,KLOR-CON  Take 1 tablet (10 mEq total) by mouth 2 (two) times daily.     pravastatin 80 MG tablet  Commonly known as:  PRAVACHOL  Take 1 tablet (80 mg total) by mouth daily.     PROAIR HFA 108 (90 BASE) MCG/ACT inhaler  Generic drug:  albuterol  USE 2 PUFFS EVERY 6 HOURS AS NEEDED FOR WHEEZING     senna-docusate 8.6-50 MG tablet  Commonly known as:  Senokot-S  Take 1 tablet by mouth 2 (two) times daily.     venlafaxine XR 75 MG 24 hr capsule  Commonly known as:  EFFEXOR-XR  Take 75 mg by mouth 3 (three) times daily.     VITAMIN D PO  Take 400 Units by mouth daily.        Major procedures and Radiology Reports - PLEASE  review detailed and final reports for all details, in brief -    EEG stable and non acute and MRI brain consistent with known history of intracerebral hemorrhage with mass effect   Ct Angio Head W/cm &/or Wo Cm  07/30/2015  CLINICAL DATA:  Found down on floor after hitting face on night stand. Patient on Plavix with LEFT-sided facial droop, no strength LEFT extremities, slurred speech and confusion. History of hypertension, diabetes and cancer. EXAM: CT ANGIOGRAPHY HEAD AND NECK TECHNIQUE: Multidetector CT imaging of the head and neck was performed using the standard protocol during bolus administration of intravenous contrast. Multiplanar CT image reconstructions and MIPs were obtained to evaluate the vascular anatomy. Carotid stenosis measurements (when applicable) are obtained utilizing NASCET criteria, using the distal internal carotid diameter as the denominator. CONTRAST:  64mL OMNIPAQUE IOHEXOL 350 MG/ML SOLN COMPARISON:  CT head and cervical spine July 29, 2015 FINDINGS: CT HEAD Re- demonstration of RIGHT thalamic intraparenchymal hematoma with intraventricular extension, mild hydrocephalus without suspicious intracranial enhancement. LEFT periorbital soft tissue swelling/hematoma without postseptal extent. CTA NECK Normal appearance of the thoracic arch, normal  branch pattern. The origins of the innominate, left Common carotid artery and subclavian artery are widely patent. Tortuous proximal LEFT subclavian artery. Bilateral Common carotid arteries are widely patent, coursing in a straight line fashion. Normal appearance of the carotid bifurcations without hemodynamically significant stenosis by NASCET criteria. Mild eccentric calcific atherosclerosis Normal appearance of the included internal carotid arteries. Minimal calcific atherosclerosis of mid RIGHT cervical internal carotid artery. Left vertebral artery is dominant, minimal eccentric calcific atherosclerosis. Normal appearance of the  vertebral arteries, which appear widely patent. No dissection, no pseudoaneurysm. No abnormal luminal irregularity. No contrast extravasation. Tortuous anterior and posterior circulation. Soft tissues are nonsuspicious; mildly thickened proximal esophagus may represent under distension. No acute osseous process though bone windows have not been submitted ; cervical spine better characterized on dedicated CT July 29, 2015. CTA HEAD Anterior circulation: Patent cervical internal carotid arteries, petrous, cavernous and supra clinoid internal carotid arteries. Moderate calcific atherosclerosis of the carotid siphons resulting in mild RIGHT para clinoid internal carotid artery stenosis. Widely patent anterior communicating artery. Dolicoectatic appearance of the cerebral vessels. Normal appearance of the anterior and middle cerebral arteries. Supernumerary anterior cerebral artery arises from RIGHT A1-2 junction. Posterior circulation: LEFT vertebral artery is dominant. Ectatic LEFT V4 segment with circumferential calcific atherosclerosis, no focal stenosis. Dolicoectatic basilar artery without discrete aneurysm. Focal moderate to high-grade stenosis RIGHT P2 segment. No large vessel occlusion, hemodynamically significant stenosis, dissection, luminal irregularity, contrast extravasation or aneurysm within the anterior nor posterior circulation. IMPRESSION: CT head: Evolving RIGHT thalamic intraparenchymal hematoma, no suspicious enhancement. CTA neck: No acute vascular or hemodynamically significant stenosis. Tortuous vessels compatible with chronic hypertension. CTA head:  No acute large vessel occlusion Moderate to high-grade stenosis RIGHT P2 segment (likely thromboembolic, less likely focal vasospasm). Dolicoectatic intracranial vessels compatible with chronic hypertension. Electronically Signed   By: Elon Alas M.D.   On: 07/30/2015 06:02   Ct Head Wo Contrast  08/08/2015  CLINICAL DATA:  Altered  mental status. EXAM: CT HEAD WITHOUT CONTRAST TECHNIQUE: Contiguous axial images were obtained from the base of the skull through the vertex without intravenous contrast. COMPARISON:  08/03/2015 FINDINGS: There is slight reduction in volume of the right thalamus hemorrhage compared to the prior studies. The volume of intraventricular blood is not significantly different. The size of the ventricles is unchanged. Mild right to left midline shift is unchanged. There is no new intracranial hemorrhage. No new focal brain abnormality is evident. There is remote lacunar infarction in the caudate nuclei. Basal cisterns remain patent. No acute bony abnormality is evident. IMPRESSION: Slowly decreasing volume of the right bowel is hemorrhage. Unchanged intraventricular hemorrhage. Mild midline shift is also unchanged. No new intracranial hemorrhage. Electronically Signed   By: Andreas Newport M.D.   On: 08/08/2015 01:32   Ct Head Wo Contrast  08/03/2015  CLINICAL DATA:  59 year old female with a history of intracranial hemorrhage EXAM: CT HEAD WITHOUT CONTRAST TECHNIQUE: Contiguous axial images were obtained from the base of the skull through the vertex without intravenous contrast. COMPARISON:  Multiple prior CT, 07/29/2015, 07/30/2015, 07/31/2015 FINDINGS: Unremarkable appearance of the calvarium without acute fracture or aggressive lesion. Unremarkable appearance of the scalp soft tissues. Unremarkable appearance of the bilateral orbits. Mastoid air cells are clear. No significant paranasal sinus disease Re- demonstration of right the Lamb a Chem ridge, measuring 3.7 cm x 2.8 cm in greatest axial diameter. Overall, these are not significantly changed from the comparison study. Intraventricular extension again evident, without significant change in volume. Bifrontal  diameter measures 3.8 cm, decreased from the comparison study. No significant midline shift measured at the foramen of [place]. Expansion of the bilateral  lateral ventricles similar in size to the comparison. Involving edema circumferentially about the hemorrhage, slightly more pronounced than the comparison. No new focus of hemorrhage. Re- demonstration of intracranial atherosclerotic changes. IMPRESSION: Re- demonstration of intracranial hemorrhage centered within the right thalamus with intraventricular extension. Overall size of the hemorrhage is unchanged and volume of hemorrhage within the ventricular system is unchanged. There is relatively stable associated hydrocephalus, with evolving peri-hemorrhage edema. No new focus of hemorrhage identified. Signed, Dulcy Fanny. Earleen Newport, DO [facility] Electronically Signed   By: Corrie Mckusick D.O.   On: 08/03/2015 18:12   Ct Head Wo Contrast  07/31/2015  CLINICAL DATA:  Mental status change. The patient is no longer responding. Brain hemorrhage. EXAM: CT HEAD WITHOUT CONTRAST TECHNIQUE: Contiguous axial images were obtained from the base of the skull through the vertex without intravenous contrast. COMPARISON:  CTA head 07/30/2015. FINDINGS: A right aligned neck hemorrhage extending into the brainstem has not significantly changed in size. There is slight increased blood layering within the ventricles bilaterally. There is no hydrocephalus. The there is hypoattenuation along the right cerebral peduncle and extending into the brainstem. This is likely exaggerated by beam hardening in the brainstem. A remote lacunar infarct is again seen in the anterior limb left internal capsule. Midline shift at the level of hemorrhage is not significantly changed, measuring 7.5 mm. No new hemorrhages are present. The paranasal sinuses and mastoid air cells are clear. IMPRESSION: 1. No significant interval change in the size of a right thalamic hemorrhage with extension into the brainstem. 2. Surrounding edema and midline shift is stable. 3. Slight increase in interventricular  hemorrhage with layering blood products. There is no hydrocephalus. 4. Hypoattenuation in the brainstem is likely artifactual, appearance similar to the prior exam. 5. Stable atrophy and white matter disease otherwise. These results were called by telephone at the time of interpretation on 07/31/2015 at 8:21 pm to Dr. Dorian Pod , who verbally acknowledged these results. Electronically Signed   By: San Morelle M.D.   On: 07/31/2015 20:21   Ct Head Wo Contrast  07/29/2015  CLINICAL DATA:  [place] last night. Left periorbital bruising and swelling. Right facial droop. Code stroke. EXAM: CT HEAD WITHOUT CONTRAST TECHNIQUE: Contiguous axial images were obtained from the base of the skull through the vertex without intravenous contrast. COMPARISON:  10/01/2014. FINDINGS: Right thalamic hemorrhage measuring 3.6 x 2.6 cm on image 18. There is also intraventricular hemorrhage. At the location of the splenic hemorrhage, there is 6 mm of midline shift from right to left. A old left basal ganglia lacunar infarct. Diffusely enlarged and partially calcified basilar artery is again noted. No skull fracture or paranasal sinus air-fluid levels. Left maxillary sinus retention cysts. Bilateral basal ganglia calcification. IMPRESSION: 1. Right thalamic hemorrhage with 6 mm of midline shift from right to left. 2. Intraventricular hemorrhage. 3. Old left basal ganglia lacunar infarct. 4. 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 These results were called by telephone at the time of interpretation on 07/29/2015 at 7:11 a.m. to Dr. Vanita Panda, who verbally acknowledged these results. Electronically Signed   By: Claudie Revering M.D.   On: 07/29/2015 09:52   Ct Angio Neck W/cm &/or Wo/cm  07/30/2015  CLINICAL DATA:  Found down on floor after hitting face on night stand. Patient on Plavix with LEFT-sided facial  droop, no strength LEFT extremities, slurred speech and confusion. History of hypertension,  diabetes and cancer. EXAM: CT ANGIOGRAPHY HEAD AND NECK TECHNIQUE: Multidetector CT imaging of the head and neck was performed using the standard protocol during bolus administration of intravenous contrast. Multiplanar CT image reconstructions and MIPs were obtained to evaluate the vascular anatomy. Carotid stenosis measurements (when applicable) are obtained utilizing NASCET criteria, using the distal internal carotid diameter as the denominator. CONTRAST:  60mL OMNIPAQUE IOHEXOL 350 MG/ML SOLN COMPARISON:  CT head and cervical spine July 29, 2015 FINDINGS: CT HEAD Re- demonstration of RIGHT thalamic intraparenchymal hematoma with intraventricular extension, mild hydrocephalus without suspicious intracranial enhancement. LEFT periorbital soft tissue swelling/hematoma without postseptal extent. CTA NECK Normal appearance of the thoracic arch, normal branch pattern. The origins of the innominate, left Common carotid artery and subclavian artery are widely patent. Tortuous proximal LEFT subclavian artery. Bilateral Common carotid arteries are widely patent, coursing in a straight line fashion. Normal appearance of the carotid bifurcations without hemodynamically significant stenosis by NASCET criteria. Mild eccentric calcific atherosclerosis Normal appearance of the included internal carotid arteries. Minimal calcific atherosclerosis of mid RIGHT cervical internal carotid artery. Left vertebral artery is dominant, minimal eccentric calcific atherosclerosis. Normal appearance of the vertebral arteries, which appear widely patent. No dissection, no pseudoaneurysm. No abnormal luminal irregularity. No contrast extravasation. Tortuous anterior and posterior circulation. Soft tissues are nonsuspicious; mildly thickened proximal esophagus may represent under distension. No acute osseous process though bone windows have not been submitted ; cervical spine better characterized on dedicated CT July 29, 2015. CTA HEAD  Anterior circulation: Patent cervical internal carotid arteries, petrous, cavernous and supra clinoid internal carotid arteries. Moderate calcific atherosclerosis of the carotid siphons resulting in mild RIGHT para clinoid internal carotid artery stenosis. Widely patent anterior communicating artery. Dolicoectatic appearance of the cerebral vessels. Normal appearance of the anterior and middle cerebral arteries. Supernumerary anterior cerebral artery arises from RIGHT A1-2 junction. Posterior circulation: LEFT vertebral artery is dominant. Ectatic LEFT V4 segment with circumferential calcific atherosclerosis, no focal stenosis. Dolicoectatic basilar artery without discrete aneurysm. Focal moderate to high-grade stenosis RIGHT P2 segment. No large vessel occlusion, hemodynamically significant stenosis, dissection, luminal irregularity, contrast extravasation or aneurysm within the anterior nor posterior circulation. IMPRESSION: CT head: Evolving RIGHT thalamic intraparenchymal hematoma, no suspicious enhancement. CTA neck: No acute vascular or hemodynamically significant stenosis. Tortuous vessels compatible with chronic hypertension. CTA head:  No acute large vessel occlusion Moderate to high-grade stenosis RIGHT P2 segment (likely thromboembolic, less likely focal vasospasm). Dolicoectatic intracranial vessels compatible with chronic hypertension. Electronically Signed   By: Elon Alas M.D.   On: 07/30/2015 06:02   Ct Cervical Spine Wo Contrast  07/29/2015  CLINICAL DATA:  Left periorbital swelling and right facial droop following a fall last night. EXAM: CT MAXILLOFACIAL WITHOUT CONTRAST CT CERVICAL SPINE WITHOUT CONTRAST TECHNIQUE: Multidetector CT imaging of the cervical spine and maxillofacial structures were performed using the standard protocol without intravenous contrast. Multiplanar CT image reconstructions of the cervical spine and maxillofacial structures were also generated. COMPARISON:  Head  CT obtained earlier today. FINDINGS: CT MAXILLOFACIAL FINDINGS The previously described intracranial hemorrhage is again noted. No maxillofacial fractures or paranasal sinus air-fluid levels. Mild left TMJ degenerative changes. CT CERVICAL SPINE FINDINGS There some limitation due to patient motion. No prevertebral soft tissue swelling, fractures or subluxations are seen. Degenerative changes are noted at multiple levels. The facets on the left are fused at the C4-5 level. Mild bilateral carotid artery calcification is noted.  IMPRESSION: 1. No maxillofacial fractures. 2. No cervical spine fracture or subluxation. 3. Cervical spine degenerative changes. 4. Mild bilateral carotid artery atheromatous calcifications. Electronically Signed   By: Claudie Revering M.D.   On: 07/29/2015 10:19   Mr Brain Wo Contrast  08/08/2015  CLINICAL DATA:  Altered mental status and aphasia beginning 4 hours ago. Baseline of LEFT-sided deficits from prior stroke. Fever. EXAM: MRI HEAD WITHOUT CONTRAST TECHNIQUE: Multiplanar, multiecho pulse sequences of the brain and surrounding structures were obtained without intravenous contrast. COMPARISON:  CT head August 08, 2015 at 1:20 a.m. and MRI of the head October 01, 2014 FINDINGS: No reduced diffusion to suggest acute ischemia. Spurious reduced diffusion within the RIGHT thalamic hemorrhage, with faint peripheral susceptibility artifact, rim of T1 shortening. RIGHT thalamus 3.9 x 3.2 cm hematoma with surrounding T2 bright vasogenic edema results in local mass-effect, partially effacing the third ventricle. Increasing ventriculomegaly, compatible with mild hydrocephalus. Layering susceptibility artifact in the occipital horns with moderate ventriculomegaly. Moderate global parenchymal brain volume loss for age. Additional scattered micro hemorrhages predominantly in the periphery of the brain and cerebellum, similar. Small amount of extra-axial hemosiderin staining compatible with prior  subarachnoid hemorrhage. Innumerable tiny T2 hyperintensities in the basal ganglia favor perivascular spaces. In addition, old bilateral basal ganglia lacunar infarcts. No abnormal extra-axial fluid collections. dolicoectatic intracranial vascular flow voids. Ocular globes and orbital contents are unremarkable. Paranasal sinuses are well aerated. Trace LEFT mastoid effusion. Severe LEFT temporomandibular osteoarthrosis. No abnormal calvarial bone marrow signal. Patient is edentulous. IMPRESSION: No acute infarct. Evolving subacute RIGHT thalamic intraparenchymal hemorrhage. Persistent mass effect on the third ventricle with intraventricular extension and mild hydrocephalus. Similar micro hemorrhages most compatible with chronic hypertension, corroborated by dolicoectatic intracranial vessels. Evidence of old subarachnoid hemorrhage/redistributed blood products. Mild parenchymal brain volume loss for age. Fall bilateral basal ganglia lacunar infarcts. Electronically Signed   By: Elon Alas M.D.   On: 08/08/2015 04:52   Dg Pelvis Portable  07/29/2015  CLINICAL DATA:  Right hip pain after falling today. Initial encounter. EXAM: PORTABLE PELVIS 1-2 VIEWS COMPARISON:  CT 12/09/2011. FINDINGS: 0808 hours. The mineralization and alignment are normal. There is no evidence of acute pelvic fracture or sacroiliac joint diastasis. Lower lumbar spondylosis noted. Small left pelvic calcification is unchanged, likely phlebolith. IMPRESSION: No evidence of acute pelvic fracture or dislocation. If there is focal hip pain or inability to bear weight, dedicated hip radiographs recommended. Electronically Signed   By: Richardean Sale M.D.   On: 07/29/2015 09:41   Dg Chest Port 1 View  08/08/2015  CLINICAL DATA:  Fever, altered mental status. History of hypertension, bronchitis, diabetes, cancer. EXAM: PORTABLE CHEST 1 VIEW COMPARISON:  Chest radiograph August 04, 2015 FINDINGS: Cardiac silhouette is upper limits of  normal in size, even with consideration to this low inspiratory portable examination with crowded vascular markings. Tortuous aorta. No pleural effusion or focal consolidation. Patient rotated to the RIGHT. No pneumothorax. Soft tissue planes and included osseous structures are nonsuspicious. Moderate degenerative change of the thoracic spine. IMPRESSION: Borderline cardiomegaly, no acute pulmonary process in this low inspiratory portable examination. Electronically Signed   By: Elon Alas M.D.   On: 08/08/2015 00:43   Dg Chest Port 1 View  08/04/2015  CLINICAL DATA:  Cough EXAM: PORTABLE CHEST 1 VIEW COMPARISON:  08/02/2015 FINDINGS: The lung volumes are very low. Heart size appears normal. No pleural effusion or edema. No airspace consolidation. IMPRESSION: 1. Low lung volumes. Electronically Signed   By: Lovena Le  Clovis Riley M.D.   On: 08/04/2015 08:06   Dg Chest Port 1 View  08/02/2015  CLINICAL DATA:  Congestive heart failure.  Shortness of breath. EXAM: PORTABLE CHEST - 1 VIEW COMPARISON:  One-view chest 07/29/2015. FINDINGS: The heart is enlarged. Lung volumes are low. This exaggerates the pulmonary interstitium. No focal airspace disease is evident. The visualized soft tissues and bony thorax are unremarkable. IMPRESSION: 1. Low lung volumes. 2. Cardiomegaly without failure. 3. Left basilar airspace densities on the prior exam have cleared. Electronically Signed   By: San Morelle M.D.   On: 08/02/2015 09:07   Dg Chest Portable 1 View  07/29/2015  CLINICAL DATA:  Code stroke, status post fall today, no history of acute cardiopulmonary abnormality. EXAM: PORTABLE CHEST 1 VIEW COMPARISON:  PA and lateral chest x-ray of October 01, 2014 FINDINGS: The lungs are reasonably well inflated. There is subsegmental atelectasis in the left mid and lower lung. The cardiac silhouette is mildly enlarged though stable. The pulmonary vascularity is not engorged. There is tortuosity of the descending  thoracic aorta. The observed bony thorax exhibits no acute abnormality. IMPRESSION: Subsegmental atelectasis or early infiltrate in the lingula and or left lower lobe. There is no CHF nor other acute cardiopulmonary abnormality. Electronically Signed   By: David  Martinique M.D.   On: 07/29/2015 09:38   Ct Maxillofacial Wo Cm  07/29/2015  CLINICAL DATA:  Left periorbital swelling and right facial droop following a fall last night. EXAM: CT MAXILLOFACIAL WITHOUT CONTRAST CT CERVICAL SPINE WITHOUT CONTRAST TECHNIQUE: Multidetector CT imaging of the cervical spine and maxillofacial structures were performed using the standard protocol without intravenous contrast. Multiplanar CT image reconstructions of the cervical spine and maxillofacial structures were also generated. COMPARISON:  Head CT obtained earlier today. FINDINGS: CT MAXILLOFACIAL FINDINGS The previously described intracranial hemorrhage is again noted. No maxillofacial fractures or paranasal sinus air-fluid levels. Mild left TMJ degenerative changes. CT CERVICAL SPINE FINDINGS There some limitation due to patient motion. No prevertebral soft tissue swelling, fractures or subluxations are seen. Degenerative changes are noted at multiple levels. The facets on the left are fused at the C4-5 level. Mild bilateral carotid artery calcification is noted. IMPRESSION: 1. No maxillofacial fractures. 2. No cervical spine fracture or subluxation. 3. Cervical spine degenerative changes. 4. Mild bilateral carotid artery atheromatous calcifications. Electronically Signed   By: Claudie Revering M.D.   On: 07/29/2015 10:19    Micro Results      Recent Results (from the past 240 hour(s))  Blood Culture (routine x 2)     Status: None (Preliminary result)   Collection Time: 08/08/15 12:26 AM  Result Value Ref Range Status   Specimen Description BLOOD RIGHT FOREARM  Final   Special Requests BOTTLES DRAWN AEROBIC AND ANAEROBIC 5CC  Final   Culture NO GROWTH 2 DAYS   Final   Report Status PENDING  Incomplete  Blood Culture (routine x 2)     Status: None (Preliminary result)   Collection Time: 08/08/15 12:34 AM  Result Value Ref Range Status   Specimen Description BLOOD RIGHT HAND  Final   Special Requests BOTTLES DRAWN AEROBIC AND ANAEROBIC 5CC  Final   Culture NO GROWTH 2 DAYS  Final   Report Status PENDING  Incomplete  Urine culture     Status: None   Collection Time: 08/08/15  1:55 AM  Result Value Ref Range Status   Specimen Description URINE, CATHETERIZED  Final   Special Requests NONE  Final   Culture  Final    >=100,000 COLONIES/mL STAPHYLOCOCCUS SPECIES (COAGULASE NEGATIVE)   Report Status 08/10/2015 FINAL  Final   Organism ID, Bacteria STAPHYLOCOCCUS SPECIES (COAGULASE NEGATIVE)  Final      Susceptibility   Staphylococcus species (coagulase negative) - MIC*    CIPROFLOXACIN >=8 RESISTANT Resistant     GENTAMICIN <=0.5 SENSITIVE Sensitive     NITROFURANTOIN <=16 SENSITIVE Sensitive     OXACILLIN >=4 RESISTANT Resistant     TETRACYCLINE 2 SENSITIVE Sensitive     VANCOMYCIN 2 SENSITIVE Sensitive     TRIMETH/SULFA 80 RESISTANT Resistant     CLINDAMYCIN <=0.25 RESISTANT Resistant     RIFAMPIN <=0.5 SENSITIVE Sensitive     Inducible Clindamycin POSITIVE Resistant     * >=100,000 COLONIES/mL STAPHYLOCOCCUS SPECIES (COAGULASE NEGATIVE)       Today   Subjective    Deyana Thornell today has no headache,no chest abdominal pain,no new weakness tingling or numbness, feels much better wants to go home today.     Objective   Blood pressure 147/82, pulse 84, temperature 98.8 F (37.1 C), temperature source Oral, resp. rate 18, height 5\' 2"  (1.575 m), weight 84.5 kg (186 lb 4.6 oz), SpO2 95 %.   Intake/Output Summary (Last 24 hours) at 08/10/15 1054 Last data filed at 08/10/15 0906  Gross per 24 hour  Intake   2485 ml  Output   2450 ml  Net     35 ml    Exam Awake Alert, Oriented x 3, No new F.N deficits, Normal  affect [place].AT,PERRAL Supple Neck,No JVD, No cervical lymphadenopathy appriciated.  Symmetrical Chest wall movement, Good air movement bilaterally, CTAB RRR,No Gallops,Rubs or new Murmurs, No Parasternal Heave +ve B.Sounds, Abd Soft, Non tender, No organomegaly appriciated, No rebound -guarding or rigidity. No Cyanosis, Clubbing or edema, No new Rash or bruise   Data Review   CBC w Diff:  Lab Results  Component Value Date   WBC 8.7 08/10/2015   HGB 12.8 08/10/2015   HCT 39.5 08/10/2015   PLT 191 08/10/2015   LYMPHOPCT 28 08/08/2015   MONOPCT 7 08/08/2015   EOSPCT 1 08/08/2015   BASOPCT 1 08/08/2015    CMP:  Lab Results  Component Value Date   NA 140 08/10/2015   K 4.0 08/10/2015   CL 103 08/10/2015   CO2 28 08/10/2015   BUN 9 08/10/2015   CREATININE 0.40* 08/10/2015   PROT 6.6 08/08/2015   ALBUMIN 2.9* 08/08/2015   BILITOT 0.8 08/08/2015   ALKPHOS 50 08/08/2015   AST 23 08/08/2015   ALT 19 08/08/2015  .   Total Time in preparing paper work, data evaluation and todays exam - 35 minutes  Thurnell Lose M.D on 08/10/2015 at 10:54 AM  [facility]   Office  [phone]

## 2015-08-13 LAB — CULTURE, BLOOD (ROUTINE X 2)
Culture: NO GROWTH
Culture: NO GROWTH

## 2015-08-16 ENCOUNTER — Emergency Department (HOSPITAL_COMMUNITY)
Admission: EM | Admit: 2015-08-16 | Discharge: 2015-08-17 | Disposition: A | Discharge status: Home / Self Care | Payer: Medicare Other | Attending: Emergency Medicine | Admitting: Emergency Medicine

## 2015-08-16 ENCOUNTER — Encounter (HOSPITAL_COMMUNITY): Payer: Self-pay | Admitting: Cardiology

## 2015-08-16 ENCOUNTER — Emergency Department (HOSPITAL_COMMUNITY): Payer: Medicare Other

## 2015-08-16 DIAGNOSIS — Z87891 Personal history of nicotine dependence: Secondary | ICD-10-CM | POA: Insufficient documentation

## 2015-08-16 DIAGNOSIS — R531 Weakness: Secondary | ICD-10-CM | POA: Insufficient documentation

## 2015-08-16 DIAGNOSIS — I10 Essential (primary) hypertension: Secondary | ICD-10-CM | POA: Insufficient documentation

## 2015-08-16 DIAGNOSIS — Z794 Long term (current) use of insulin: Secondary | ICD-10-CM | POA: Insufficient documentation

## 2015-08-16 DIAGNOSIS — Z87442 Personal history of urinary calculi: Secondary | ICD-10-CM | POA: Insufficient documentation

## 2015-08-16 DIAGNOSIS — K219 Gastro-esophageal reflux disease without esophagitis: Secondary | ICD-10-CM | POA: Diagnosis not present

## 2015-08-16 DIAGNOSIS — E7800 Pure hypercholesterolemia, unspecified: Secondary | ICD-10-CM | POA: Insufficient documentation

## 2015-08-16 DIAGNOSIS — Z8701 Personal history of pneumonia (recurrent): Secondary | ICD-10-CM | POA: Diagnosis not present

## 2015-08-16 DIAGNOSIS — Z792 Long term (current) use of antibiotics: Secondary | ICD-10-CM | POA: Insufficient documentation

## 2015-08-16 DIAGNOSIS — F329 Major depressive disorder, single episode, unspecified: Secondary | ICD-10-CM | POA: Diagnosis not present

## 2015-08-16 DIAGNOSIS — R1013 Epigastric pain: Secondary | ICD-10-CM | POA: Diagnosis not present

## 2015-08-16 DIAGNOSIS — Z8589 Personal history of malignant neoplasm of other organs and systems: Secondary | ICD-10-CM | POA: Diagnosis not present

## 2015-08-16 DIAGNOSIS — G4730 Sleep apnea, unspecified: Secondary | ICD-10-CM | POA: Diagnosis not present

## 2015-08-16 DIAGNOSIS — R29810 Facial weakness: Secondary | ICD-10-CM | POA: Insufficient documentation

## 2015-08-16 DIAGNOSIS — E119 Type 2 diabetes mellitus without complications: Secondary | ICD-10-CM | POA: Insufficient documentation

## 2015-08-16 DIAGNOSIS — Z8673 Personal history of transient ischemic attack (TIA), and cerebral infarction without residual deficits: Secondary | ICD-10-CM | POA: Insufficient documentation

## 2015-08-16 DIAGNOSIS — R079 Chest pain, unspecified: Secondary | ICD-10-CM | POA: Diagnosis present

## 2015-08-16 DIAGNOSIS — J45909 Unspecified asthma, uncomplicated: Secondary | ICD-10-CM | POA: Insufficient documentation

## 2015-08-16 DIAGNOSIS — Z79899 Other long term (current) drug therapy: Secondary | ICD-10-CM | POA: Insufficient documentation

## 2015-08-16 DIAGNOSIS — Z9981 Dependence on supplemental oxygen: Secondary | ICD-10-CM | POA: Diagnosis not present

## 2015-08-16 LAB — CBC WITH DIFFERENTIAL/PLATELET
BASOS PCT: 1 %
Basophils Absolute: 0 10*3/uL (ref 0.0–0.1)
EOS PCT: 2 %
Eosinophils Absolute: 0.1 10*3/uL (ref 0.0–0.7)
HEMATOCRIT: 40 % (ref 36.0–46.0)
HEMOGLOBIN: 12.9 g/dL (ref 12.0–15.0)
Lymphocytes Relative: 24 %
Lymphs Abs: 1.5 10*3/uL (ref 0.7–4.0)
MCH: 28.5 pg (ref 26.0–34.0)
MCHC: 32.3 g/dL (ref 30.0–36.0)
MCV: 88.5 fL (ref 78.0–100.0)
MONO ABS: 0.3 10*3/uL (ref 0.1–1.0)
Monocytes Relative: 5 %
Neutro Abs: 4.4 10*3/uL (ref 1.7–7.7)
Neutrophils Relative %: 68 %
Platelets: 200 10*3/uL (ref 150–400)
RBC: 4.52 MIL/uL (ref 3.87–5.11)
RDW: 13.8 % (ref 11.5–15.5)
WBC: 6.3 10*3/uL (ref 4.0–10.5)

## 2015-08-16 LAB — URINALYSIS, ROUTINE W REFLEX MICROSCOPIC
Bilirubin Urine: NEGATIVE
Glucose, UA: NEGATIVE mg/dL
Hgb urine dipstick: NEGATIVE
Ketones, ur: NEGATIVE mg/dL
LEUKOCYTES UA: NEGATIVE
NITRITE: NEGATIVE
PH: 5 (ref 5.0–8.0)
Protein, ur: NEGATIVE mg/dL
SPECIFIC GRAVITY, URINE: 1.01 (ref 1.005–1.030)
UROBILINOGEN UA: 0.2 mg/dL (ref 0.0–1.0)

## 2015-08-16 LAB — COMPREHENSIVE METABOLIC PANEL
ALBUMIN: 3.4 g/dL — AB (ref 3.5–5.0)
ALK PHOS: 58 U/L (ref 38–126)
ALT: 36 U/L (ref 14–54)
ANION GAP: 10 (ref 5–15)
AST: 37 U/L (ref 15–41)
BILIRUBIN TOTAL: 0.5 mg/dL (ref 0.3–1.2)
BUN: 12 mg/dL (ref 6–20)
CALCIUM: 9.4 mg/dL (ref 8.9–10.3)
CO2: 27 mmol/L (ref 22–32)
Chloride: 104 mmol/L (ref 101–111)
Creatinine, Ser: 0.59 mg/dL (ref 0.44–1.00)
GFR calc Af Amer: >60 mL/min (ref >60)
GFR calc non Af Amer: >60 mL/min (ref >60)
Glucose, Bld: 169 mg/dL — ABNORMAL HIGH (ref 65–99)
Potassium: 4 mmol/L (ref 3.5–5.1)
Sodium: 141 mmol/L (ref 135–145)
TOTAL PROTEIN: 7 g/dL (ref 6.5–8.1)

## 2015-08-16 LAB — TROPONIN I

## 2015-08-16 LAB — LIPASE, BLOOD: Lipase: 61 U/L — ABNORMAL HIGH (ref 11–51)

## 2015-08-16 MED ORDER — ONDANSETRON 4 MG PO TBDP
4.0000 mg | ORAL_TABLET | Freq: Three times a day (TID) | ORAL | Status: DC | PRN
Start: 2015-08-16 — End: 2016-09-18

## 2015-08-16 MED ORDER — ACETAMINOPHEN 325 MG PO TABS
650.0000 mg | ORAL_TABLET | Freq: Once | ORAL | Status: AC
  Administered 2015-08-16: 650 mg via ORAL
  Filled 2015-08-16: qty 2

## 2015-08-16 NOTE — ED Notes (Signed)
[facility] called @ 2200

## 2015-08-16 NOTE — ED Notes (Signed)
MD at bedside. 

## 2015-08-16 NOTE — ED Notes (Signed)
Per Shirlean Mylar, EMT pt would like benedryl to help her sleep, and tylenol for her headache.

## 2015-08-16 NOTE — ED Provider Notes (Signed)
CSN: [id number]     Arrival date & time 08/16/15  1721 History   First MD Initiated Contact with Patient 08/16/15 1722     Chief Complaint  Patient presents with  . Chest Pain     (Consider location/radiation/quality/duration/timing/severity/associated sxs/prior Treatment) Patient is a 59 y.o. female presenting with chest pain. The history is provided by the patient.  Chest Pain Associated symptoms: abdominal pain and weakness   Associated symptoms: no back pain, no headache, no nausea, no numbness, no shortness of breath and not vomiting    patient presents with chest pain.  It began around 12:30 today. It is come and gone the last about a minute apiece. May come on with exertion. Is in her lower chest or upper abdomen. States it does somewhat feel with previous pancreatitis. Not worse with eating. Rare cough. No fevers or chills. No abdominal pain. She is a nursing home after previous stroke. No swelling in her legs.  Past Medical History  Diagnosis Date  . Hypertension   . Hypercholesterolemia   . Bronchitis   . Pancreatitis   . Complication of anesthesia 12/11/11    "angry, mean, hateful after" endoscopy & colonoscopy  . Asthma ~ 1991  . Pneumonia 06/2011; 07/2011  . Diabetes mellitus type 2, controlled ([place])   . GERD (gastroesophageal reflux disease)   . GI bleeding 12/21/11  . Hx of colonoscopy with polypectomy 12/11/11    "took out 7; couldn't get #8, that one was precancerous"  . Depression   . 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 Paralysis gastric   . Kidney stones   . Sleep apnea     uses a cpap-oxygen at night  . Stroke [facility])    Past Surgical History  Procedure Laterality Date  . Cesarean section  1981; 1986  . Esophagogastroduodenoscopy  12/11/2011    Procedure: ESOPHAGOGASTRODUODENOSCOPY (EGD);  Surgeon: Missy Sabins, MD;  Location[facility] ENDOSCOPY;  Service: Endoscopy;  Laterality: N/A;  . Colonoscopy  12/11/2011    Procedure:  COLONOSCOPY;  Surgeon: Missy Sabins, MD;  Location: [place];  Service: Endoscopy;  Laterality: N/A;  . Abdominal hysterectomy  2002  . Cholecystectomy  ~ 2002  . Tonsillectomy and adenoidectomy  1988  . Appendectomy  ~ 2002    "w/hysterectomy"  . Colonoscopy  04/26/2012    Procedure: COLONOSCOPY;  Surgeon: Wonda Horner, MD;  Location: [facility] ENDOSCOPY;  Service: Endoscopy;  Laterality: N/A;  apc  . Hernia repair      Umbilical  . Open reduction internal fixation (orif) distal radial fracture Left 01/07/2015    Procedure: OPEN REDUCTION INTERNAL FIXATION (ORIF) LEFT DISTAL RADIAL FRACTURE;  Surgeon: Milly Jakob, MD;  Location: [place];  Service: Orthopedics;  Laterality: Left;  . Knee arthroscopy with medial menisectomy Right 03/29/2015    Procedure: ARTHROSCOPY KNEE, medial menisectomy, removal of loose body;  Surgeon: Frederik Pear, MD;  Location: [place];  Service: Orthopedics;  Laterality: Right;  RIGHT KNEE ARTHROSCOPY   Family History  Problem Relation Age of Onset  . Colon cancer Sister   . Alzheimer's disease Mother   . Heart disease Father     Enlarged heart   Social History  Substance Use Topics  . Smoking status: Former Smoker -- 0.12 packs/day for 6 years    Types: Cigarettes    Quit date: 12/09/1996  . Smokeless tobacco: Never Used  . Alcohol Use:  No   OB History    No data available     Review of Systems  Constitutional: Negative for activity change and appetite change.  Eyes: Negative for pain.  Respiratory: Negative for chest tightness and shortness of breath.   Cardiovascular: Positive for chest pain. Negative for leg swelling.  Gastrointestinal: Positive for abdominal pain. Negative for nausea, vomiting and diarrhea.  Genitourinary: Negative for flank pain.  Musculoskeletal: Negative for back pain and neck stiffness.  Skin: Negative for rash.  Neurological: Positive for weakness. Negative for numbness and headaches.   Psychiatric/Behavioral: Negative for behavioral problems.      Allergies  Aspirin; Codeine; Penicillins; Reglan; Xifaxan; Zocor; and Crestor  Home Medications   Prior to Admission medications   Medication Sig Start Date End Date Taking? Authorizing Provider  acetaminophen (TYLENOL) 500 MG tablet Take 1,000 mg by mouth daily as needed for fever or headache (headache).    Yes Historical Provider, MD  cholecalciferol (VITAMIN D) 400 UNITS TABS tablet Take 400 Units by mouth daily.   Yes Historical Provider, MD  Coenzyme Q10 (CO Q-10) 100 MG CAPS Take 100 mg by mouth at bedtime.   Yes Historical Provider, MD  dicyclomine (BENTYL) 10 MG capsule Take 10 mg by mouth 4 (four) times daily -  before meals and at bedtime.   Yes Historical Provider, MD  doxycycline (VIBRAMYCIN) 100 MG capsule Take 1 capsule (100 mg total) by mouth 2 (two) times daily. For 7 more days 08/10/15  Yes Thurnell Lose, MD  ezetimibe (ZETIA) 10 MG tablet Take 1 tablet (10 mg total) by mouth daily. Patient taking differently: Take 10 mg by mouth at bedtime.  08/07/15  Yes Donzetta Starch, NP  fluticasone (FLONASE) 50 MCG/ACT nasal spray Place 2 sprays into both nostrils daily as needed for allergies.  11/12/14  Yes Historical Provider, MD  gabapentin (NEURONTIN) 300 MG capsule Take 2 capsules (600 mg total) by mouth 3 (three) times daily. 04/17/15  Yes Wallene Huh, DPM  hydrALAZINE (APRESOLINE) 50 MG tablet Take 1 tablet (50 mg total) by mouth every 8 (eight) hours. 08/07/15  Yes Donzetta Starch, NP  hydroxypropyl cellulose (LACRISERT) 5 MG INST Place 5 mg into both eyes 3 (three) times daily. 08/07/15  Yes Donzetta Starch, NP  insulin aspart (NOVOLOG) 100 UNIT/ML injection Inject 0-15 Units into the skin 3 (three) times daily with meals. 08/10/15  Yes Thurnell Lose, MD  insulin detemir (LEVEMIR) 100 UNIT/ML injection Inject 0.4 mLs (40 Units total) into the skin daily. Patient taking differently: Inject 40 Units into the skin  every evening.  08/07/15  Yes Donzetta Starch, NP  labetalol (NORMODYNE) 100 MG tablet Take 1 tablet (100 mg total) by mouth 2 (two) times daily. 08/10/15  Yes Thurnell Lose, MD  lisinopril (PRINIVIL,ZESTRIL) 10 MG tablet Take 1 tablet (10 mg total) by mouth 2 (two) times daily. Patient taking differently: Take 20 mg by mouth 2 (two) times daily.  08/10/15  Yes Thurnell Lose, MD  LORazepam (ATIVAN) 0.5 MG tablet Take 1 tablet (0.5 mg total) by mouth 4 (four) times daily. FOR ANXIETY 08/07/15  Yes Donzetta Starch, NP  meclizine (ANTIVERT) 12.5 MG tablet Take 12.5 mg by mouth 3 (three) times daily as needed for dizziness.   Yes Historical Provider, MD  polyvinyl alcohol (LIQUIFILM TEARS) 1.4 % ophthalmic solution Place 1 drop into both eyes 4 (four) times daily.   Yes Historical Provider, MD  potassium chloride (K-DUR,KLOR-CON)  10 MEQ tablet Take 1 tablet (10 mEq total) by mouth 2 (two) times daily. 08/07/15  Yes Donzetta Starch, NP  pravastatin (PRAVACHOL) 80 MG tablet Take 1 tablet (80 mg total) by mouth daily. Patient taking differently: Take 80 mg by mouth at bedtime.  08/07/15 08/06/16 Yes Donzetta Starch, NP  PROAIR HFA 108 (90 BASE) MCG/ACT inhaler USE 2 PUFFS EVERY 6 HOURS AS NEEDED FOR WHEEZING   Yes Clinton D Young, MD  senna-docusate (SENOKOT-S) 8.6-50 MG tablet Take 1 tablet by mouth 2 (two) times daily. 08/07/15  Yes Donzetta Starch, NP  venlafaxine XR (EFFEXOR-XR) 150 MG 24 hr capsule Take 150 mg by mouth daily with breakfast.   Yes Historical Provider, MD  amLODipine (NORVASC) 2.5 MG tablet Take 2.5 mg by mouth daily.    Historical Provider, MD  calcium carbonate (OS-CAL) 600 MG TABS tablet Take 600 mg by mouth daily.    Historical Provider, MD  fluconazole (DIFLUCAN) 50 MG tablet Take 100 mg by mouth daily.    Historical Provider, MD  furosemide (LASIX) 40 MG tablet Take 40 mg by mouth daily.    Historical Provider, MD  Multiple Vitamin (MULTIVITAMIN WITH MINERALS) TABS tablet Take 1 tablet by  mouth daily.    Historical Provider, MD  ondansetron (ZOFRAN-ODT) 4 MG disintegrating tablet Take 1 tablet (4 mg total) by mouth every 8 (eight) hours as needed for nausea or vomiting. 08/16/15   Davonna Belling, MD   BP 135/73 mmHg  Pulse 85  Temp(Src) 97.8 F (36.6 C) (Oral)  Resp 14  SpO2 94% Physical Exam  Constitutional: She appears well-developed.  HENT:  Head: Normocephalic.  Neck: Neck supple.  Cardiovascular: Normal rate.   Pulmonary/Chest: Effort normal.  Abdominal: Soft. There is tenderness.  Epigastric tenderness without rebound or guarding.  Musculoskeletal: She exhibits no tenderness.  Neurological: She is alert.  Chronic left-sided weakness and left-sided facial droop.  Skin: Skin is warm.    ED Course  Procedures (including critical care time) Labs Review Labs Reviewed  COMPREHENSIVE METABOLIC PANEL - Abnormal; Notable for the following:    Glucose, Bld 169 (*)    Albumin 3.4 (*)    All other components within normal limits  LIPASE, BLOOD - Abnormal; Notable for the following:    Lipase 61 (*)    All other components within normal limits  CBC WITH DIFFERENTIAL/PLATELET  URINALYSIS, ROUTINE W REFLEX MICROSCOPIC (NOT AT [facility])  TROPONIN I    Imaging Review Dg Chest Portable 1 View  08/16/2015  CLINICAL DATA:  Chest pain beginning around noon today. Also complaining of right hip pain. History of hypertension and COPD. EXAM: PORTABLE CHEST 1 VIEW COMPARISON:  08/08/2015 FINDINGS: Cardiac silhouette borderline enlarged. No mediastinal or hilar masses or evidence of adenopathy. Clear lungs.  No pleural effusion or pneumothorax. Bony thorax is grossly intact. IMPRESSION: No active disease. Electronically Signed   By: Lajean Manes M.D.   On: 08/16/2015 18:03   I have personally reviewed and evaluated these images and lab results as part of my medical decision-making.   EKG Interpretation   Date/Time:  Friday August 16 2015 17:32:37 EST Ventricular Rate:   87 PR Interval:  159 QRS Duration: 91 QT Interval:  373 QTC Calculation: 449 R Axis:   27 Text Interpretation:  Sinus rhythm Confirmed by Alvino Chapel  MD, Ovid Curd  [phone]) on 08/16/2015 5:42:03 PM      MDM   Final diagnoses:  Epigastric pain    Patient with epigastric  and chest pain. Has had history of same. Lipase is barely above normal but not at a level II diagnosed pancreatitis yet. Patient does feel somewhat better. EKG and lab work reassuring for cardiac cause. Will discharge home and follow-up as needed. May need further washing for the possible pancreatitis, which she's had 6 times in the past.    Davonna Belling, MD 08/16/15 2354

## 2015-08-16 NOTE — ED Notes (Signed)
[facility] has arrived to transport pt back to [facility].

## 2015-08-16 NOTE — ED Notes (Signed)
Pt to department via EMS from [facility]- reports chest pain that started around 12pm this afternoon. Reports pain a 7/10 given 4 nitro and 324 asa. Bp-115/65 HR-93 5/10 pain after the nitro.

## 2015-08-16 NOTE — ED Notes (Signed)
Portable xray @bedside

## 2015-08-16 NOTE — Discharge Instructions (Signed)
Abdominal Pain, Adult °Many things can cause abdominal pain. Usually, abdominal pain is not caused by a disease and will improve without treatment. It can often be observed and treated at home. Your health care provider will do a physical exam and possibly order blood tests and X-rays to help determine the seriousness of your pain. However, in many cases, more time must pass before a clear cause of the pain can be found. Before that point, your health care provider may not know if you need more testing or further treatment. °HOME CARE INSTRUCTIONS °Monitor your abdominal pain for any changes. The following actions may help to alleviate any discomfort you are experiencing: °· Only take over-the-counter or prescription medicines as directed by your health care provider. °· Do not take laxatives unless directed to do so by your health care provider. °· Try a clear liquid diet (broth, tea, or water) as directed by your health care provider. Slowly move to a bland diet as tolerated. °SEEK MEDICAL CARE IF: °· You have unexplained abdominal pain. °· You have abdominal pain associated with nausea or diarrhea. °· You have pain when you urinate or have a bowel movement. °· You experience abdominal pain that wakes you in the night. °· You have abdominal pain that is worsened or improved by eating food. °· You have abdominal pain that is worsened with eating fatty foods. °· You have a fever. °SEEK IMMEDIATE MEDICAL CARE IF: °· Your pain does not go away within 2 hours. °· You keep throwing up (vomiting). °· Your pain is felt only in portions of the abdomen, such as the right side or the left lower portion of the abdomen. °· You pass bloody or black tarry stools. °MAKE SURE YOU: °· Understand these instructions. °· Will watch your condition. °· Will get help right away if you are not doing well or get worse. °  °This information is not intended to replace advice given to you by your health care provider. Make sure you discuss  any questions you have with your health care provider. °  °Document Released: 07/01/2005 Document Revised: 06/12/2015 Document Reviewed: 05/31/2013 °Elsevier Interactive Patient Education ©2016 Elsevier Inc[employer] ° °Acute Pancreatitis °Acute pancreatitis is a disease in which the pancreas becomes suddenly inflamed. The pancreas is a large gland located behind your stomach. The pancreas produces enzymes that help digest food. The pancreas also releases the hormones glucagon and insulin that help regulate blood sugar. Damage to the pancreas occurs when the digestive enzymes from the pancreas are activated and begin attacking the pancreas before being released into the intestine. Most acute attacks last a couple of days and can cause serious complications. Some people become dehydrated and develop low blood pressure. In severe cases, bleeding into the pancreas can lead to shock and can be life-threatening. The lungs, heart, and kidneys may fail. °CAUSES  °Pancreatitis can happen to anyone. In some cases, the cause is unknown. Most cases are caused by: °· Alcohol abuse. °· Gallstones. °Other less common causes are: °· Certain medicines. °· Exposure to certain chemicals. °· Infection. °· Damage caused by an accident (trauma). °· Abdominal surgery. °SYMPTOMS  °· Pain in the upper abdomen that may radiate to the back. °· Tenderness and swelling of the abdomen. °· Nausea and vomiting. °DIAGNOSIS  °Your caregiver will perform a physical exam. Blood and stool tests may be done to confirm the diagnosis. Imaging tests may also be done, such as X-rays, CT scans, or an ultrasound of the abdomen. °TREATMENT  °  Treatment usually requires a stay in the hospital. Treatment may include: °· Pain medicine. °· Fluid replacement through an intravenous line (IV). °· Placing a tube in the stomach to remove stomach contents and control vomiting. °· Not eating for 3 or 4 days. This gives your pancreas a rest, because enzymes are not being produced  that can cause further damage. °· Antibiotic medicines if your condition is caused by an infection. °· Surgery of the pancreas or gallbladder. °HOME CARE INSTRUCTIONS  °· Follow the diet advised by your caregiver. This may involve avoiding alcohol and decreasing the amount of fat in your diet. °· Eat smaller, more frequent meals. This reduces the amount of digestive juices the pancreas produces. °· Drink enough fluids to keep your urine clear or pale yellow. °· Only take over-the-counter or prescription medicines as directed by your caregiver. °· Avoid drinking alcohol if it caused your condition. °· Do not smoke. °· Get plenty of rest. °· Check your blood sugar at home as directed by your caregiver. °· Keep all follow-up appointments as directed by your caregiver. °SEEK MEDICAL CARE IF:  °· You do not recover as quickly as expected. °· You develop new or worsening symptoms. °· You have persistent pain, weakness, or nausea. °· You recover and then have another episode of pain. °SEEK IMMEDIATE MEDICAL CARE IF:  °· You are unable to eat or keep fluids down. °· Your pain becomes severe. °· You have a fever or persistent symptoms for more than 2 to 3 days. °· You have a fever and your symptoms suddenly get worse. °· Your skin or the white part of your eyes turn yellow (jaundice). °· You develop vomiting. °· You feel dizzy, or you faint. °· Your blood sugar is high (over 300 mg/dL). °MAKE SURE YOU:  °· Understand these instructions. °· Will watch your condition. °· Will get help right away if you are not doing well or get worse. °  °This information is not intended to replace advice given to you by your health care provider. Make sure you discuss any questions you have with your health care provider. °  °Document Released: 09/21/2005 Document Revised: 03/22/2012 Document Reviewed: 12/31/2011 °Elsevier Interactive Patient Education ©2016 Elsevier Inc. ° °

## 2015-10-17 ENCOUNTER — Ambulatory Visit: Payer: Medicare Other | Admitting: Internal Medicine

## 2015-10-18 ENCOUNTER — Telehealth: Payer: Self-pay | Admitting: *Deleted

## 2015-10-18 ENCOUNTER — Encounter: Payer: Self-pay | Admitting: Nurse Practitioner

## 2015-10-18 ENCOUNTER — Ambulatory Visit (INDEPENDENT_AMBULATORY_CARE_PROVIDER_SITE_OTHER): Payer: Medicare Other | Admitting: Nurse Practitioner

## 2015-10-18 VITALS — BP 136/83 | HR 93 | Ht 62.0 in

## 2015-10-18 DIAGNOSIS — G4733 Obstructive sleep apnea (adult) (pediatric): Secondary | ICD-10-CM

## 2015-10-18 DIAGNOSIS — E669 Obesity, unspecified: Secondary | ICD-10-CM | POA: Diagnosis not present

## 2015-10-18 DIAGNOSIS — I10 Essential (primary) hypertension: Secondary | ICD-10-CM

## 2015-10-18 DIAGNOSIS — R519 Headache, unspecified: Secondary | ICD-10-CM

## 2015-10-18 DIAGNOSIS — E1159 Type 2 diabetes mellitus with other circulatory complications: Secondary | ICD-10-CM

## 2015-10-18 DIAGNOSIS — R51 Headache: Secondary | ICD-10-CM

## 2015-10-18 DIAGNOSIS — I6330 Cerebral infarction due to thrombosis of unspecified cerebral artery: Secondary | ICD-10-CM

## 2015-10-18 DIAGNOSIS — I610 Nontraumatic intracerebral hemorrhage in hemisphere, subcortical: Secondary | ICD-10-CM | POA: Diagnosis not present

## 2015-10-18 NOTE — Progress Notes (Signed)
I reviewed above note and agree with the assessment and plan.  Once CT repeat shows resolution of ICH and IVH, I favor to start ASA 81mg  instead of plavix for stroke prevention. Thank you, Hoyle Sauer.    Rosalin Hawking, MD PhD Stroke Neurology 10/18/2015 12:54 PM

## 2015-10-18 NOTE — Progress Notes (Signed)
[facility]  PATIENT: Destiny Robinson DOB: June 18, 1956   REASON FOR VISIT: Follow-up for right thalamic bleed 10/16, hypertension hyperlipidemia, obesity and obstructive sleep apnea with CPAP,  History of stroke1/16 HISTORY FROM: Patient and 2 daughters    HISTORY OF PRESENT ILLNESS: HISTORY Destiny Robinson is a 60 y.o. female with PMH of hypertension, diabetes, diabetic gastroparesis, hyperlipidemia, obesity, OSA on CPAP who presents as a new patient for episodic left-sided weakness on 10/30/14.   Patient stated that since October last year she started to have this episodic left-sided numbness, involving left face, arm and leg. It happens 4-5 times per months for October, November and December. Most time it happens one patient was sitting still. The numbness happens acutely, lasting about 30 minutes and resolved spontaneously. Starting in December, her episode was followed by headache, the headache is more bilateral frontal temporal and occipital, 8-9/10, she has to take 2 x 500mg  Tylenol for the headache and take a nap for 2 hours. After the nap headache was gone. Blurry vision was associated with headache, denies nausea vomiting, photophobia phonophobia. After each episode she will feeding generalized weakness for a while and recovered. On 10/01/2014, she had again left-sided numbness, however, it did not resolve within 30 minutes. After 2 hours, she presented herself to the ER for further evaluation. CAT scan of the head showed no acute abnormality, but bilateral basal ganglia lacunar infarcts. MRI brain confirmed no acute infarcts but bilateral basal ganglia lacunar infarcts. Her other stroke workup negative, except high LDL and TG. The numbness lasted about 12 hours and then subsided. She was put on aspirin 81 and discharged with outpatient follow-up. After discharge, her episode was initially subsided, however for the last 3 days, she had again 5 times of left-sided  numbness episodes followed by headache. She came in today for further evaluation. She had a history of hyperlipidemia, follow-up with her PCP [name] after discharge, and was put on pravastatin 80 mg, fenofibrate, and niacin, and was told her TG and LDL were much better. She had allergic reaction to Lipitor, and not able to tolerate with Crestor or simvastatin. She had a history of hypertension on HCTZ, losartan, and metoprolol. She stated her blood pressure at home around 135/80, but today in clinic 156/93. She has a history of diabetes, diabetic neuropathy, diabetic gastroparesis, on insulin injection, metformin and glipizide. She said her sugar was between 80-90 at home, her A1c was 6.6 in December. She denies any history of migraine, however her sister and her mom have migraine. She denies any visual aura with headache, but admits to headache associated with the blurry vision. She had a history of neuropathy, has left lower extremity below knee numbness before the admission, constant and 2 now. She is obese, former smoker but quit at age of 59. She had OSA on CPAP, has been followed with pulmonology. She denies any alcohol drinking, or illicit drug use.  12/11/14 follow up DrXu the patient has been doing well. She was put on nortriptyline after last visit. She stated that for the last 1+ month, she only had one episode on 11/18/14 with left sided numbness lasting 30 min followed by headache and she had to sleep for 2 hours. The frequency much decreased than before. She had EEG which showed no seizure activity. Her BP 148/79 today but normally 135/85 at home. Her glucose between 50-150 at home. She has appointment on 01/04/15 with PCP.   Interval History7/11/16Dr. XU During the  interval time, pt is doing OK. She had a fall on 01/02/15 and had fracture at left forearm. She had surgery and currently recovering well, no functional loss. Her BP 127/82 and no recurrent symptoms. No more headaches.   UPDATE  10/18/2015. Destiny Robinson, 60 year old female returns for follow-up after admission to the hospital 07/29/2015 for right thalamic hemorrhage. She awakened that morning, was attempting to get out of bed fell and hit the nightstand and was then unable to get off the floor. She had acute onset left sided weakness causing her fall she has multiple medical problems to include diabetes hypertension hyperlipidemia obesity. She also has obstructive sleep apnea and uses CPAP. Bleed resulted in left hemiplegia and dysarthria left-sided neglect. CTA of the head and neck right P2 stenosis but otherwise unremarkable. Carotid Doppler unremarkable. 2-D echo unremarkable. LDL unable to calculate due to TG 405. Hemoglobin A1c 6.7. She was on Plavix prior to admission now on no anti-thrombotic agents due to [place]. She was discharged to skilled facility to receive physical therapy occupational therapy and speech therapy. She returns for reevaluation she is currently being treated for urinary tract infection    REVIEW OF SYSTEMS: Full 14 system review of systems performed and notable only for those listed, all others are neg:  Constitutional: neg  Cardiovascular: neg Ear/Nose/Throat: neg  Skin: neg Eyes: neg Respiratory: neg Gastroitestinal: neg Genitourinary currently being treated for UTI  Hematology/Lymphatic: neg  Endocrine: neg Musculoskeletal:neg Allergy/Immunology: neg Neurological: Memory loss, left-sided weakness, dizziness Psychiatric: Depression and anxiety Sleep : Obstructive sleep apnea with CPAP   ALLERGIES: Allergies  Allergen Reactions  . Aspirin Other (See Comments)    Bleeding events with 325mg  dose, unsure about 81mg ?  . Codeine Hypertension  . Penicillins Anaphylaxis  . Reglan [Metoclopramide] Swelling and Rash    5MG  MAKES THROAT SWELL  . Xifaxan [Rifaximin] Hives, Shortness Of Breath and Swelling    550MG  MAKES THROAT SWELL and SHORTNESS OF BREATH  . Zocor [Simvastatin] Anaphylaxis  and Hives  . Crestor [Rosuvastatin] Other (See Comments)    Cramping and Muscles ache    HOME MEDICATIONS: Outpatient Prescriptions Prior to Visit  Medication Sig Dispense Refill  . acetaminophen (TYLENOL) 500 MG tablet Take 1,000 mg by mouth daily as needed for fever or headache (headache).     Marland Kitchen amLODipine (NORVASC) 2.5 MG tablet Take 2.5 mg by mouth daily.    . calcium carbonate (OS-CAL) 600 MG TABS tablet Take 600 mg by mouth daily.    . cholecalciferol (VITAMIN D) 400 UNITS TABS tablet Take 400 Units by mouth daily.    . Coenzyme Q10 (CO Q-10) 100 MG CAPS Take 100 mg by mouth at bedtime.    . dicyclomine (BENTYL) 10 MG capsule Take 10 mg by mouth 4 (four) times daily -  before meals and at bedtime.    Marland Kitchen doxycycline (VIBRAMYCIN) 100 MG capsule Take 1 capsule (100 mg total) by mouth 2 (two) times daily. For 7 more days 10 capsule 0  . ezetimibe (ZETIA) 10 MG tablet Take 1 tablet (10 mg total) by mouth daily. (Patient taking differently: Take 10 mg by mouth at bedtime. ) 30 tablet 2  . fluconazole (DIFLUCAN) 50 MG tablet Take 100 mg by mouth daily.    . fluticasone (FLONASE) 50 MCG/ACT nasal spray Place 2 sprays into both nostrils daily as needed for allergies.     . furosemide (LASIX) 40 MG tablet Take 40 mg by mouth daily.    Marland Kitchen gabapentin (NEURONTIN) 300  MG capsule Take 2 capsules (600 mg total) by mouth 3 (three) times daily. 180 capsule 11  . hydrALAZINE (APRESOLINE) 50 MG tablet Take 1 tablet (50 mg total) by mouth every 8 (eight) hours. 90 tablet 2  . hydroxypropyl cellulose (LACRISERT) 5 MG INST Place 5 mg into both eyes 3 (three) times daily. 1 each prn  . insulin aspart (NOVOLOG) 100 UNIT/ML injection Inject 0-15 Units into the skin 3 (three) times daily with meals. 10 mL 11  . insulin detemir (LEVEMIR) 100 UNIT/ML injection Inject 0.4 mLs (40 Units total) into the skin daily. (Patient taking differently: Inject 40 Units into the skin every evening. ) 10 mL 11  . labetalol  (NORMODYNE) 100 MG tablet Take 1 tablet (100 mg total) by mouth 2 (two) times daily. 90 tablet 2  . lisinopril (PRINIVIL,ZESTRIL) 10 MG tablet Take 1 tablet (10 mg total) by mouth 2 (two) times daily. (Patient taking differently: Take 20 mg by mouth 2 (two) times daily. )    . LORazepam (ATIVAN) 0.5 MG tablet Take 1 tablet (0.5 mg total) by mouth 4 (four) times daily. FOR ANXIETY 30 tablet 0  . meclizine (ANTIVERT) 12.5 MG tablet Take 12.5 mg by mouth 3 (three) times daily as needed for dizziness.    . Multiple Vitamin (MULTIVITAMIN WITH MINERALS) TABS tablet Take 1 tablet by mouth daily.    . ondansetron (ZOFRAN-ODT) 4 MG disintegrating tablet Take 1 tablet (4 mg total) by mouth every 8 (eight) hours as needed for nausea or vomiting. 10 tablet 0  . polyvinyl alcohol (LIQUIFILM TEARS) 1.4 % ophthalmic solution Place 1 drop into both eyes 4 (four) times daily.    . potassium chloride (K-DUR,KLOR-CON) 10 MEQ tablet Take 1 tablet (10 mEq total) by mouth 2 (two) times daily. 60 tablet 2  . pravastatin (PRAVACHOL) 80 MG tablet Take 1 tablet (80 mg total) by mouth daily. (Patient taking differently: Take 80 mg by mouth at bedtime. ) 30 tablet 2  . PROAIR HFA 108 (90 BASE) MCG/ACT inhaler USE 2 PUFFS EVERY 6 HOURS AS NEEDED FOR WHEEZING 8.5 g 4  . senna-docusate (SENOKOT-S) 8.6-50 MG tablet Take 1 tablet by mouth 2 (two) times daily. 30 tablet 2  . venlafaxine XR (EFFEXOR-XR) 150 MG 24 hr capsule Take 150 mg by mouth daily with breakfast.     No facility-administered medications prior to visit.    PAST MEDICAL HISTORY: Past Medical History  Diagnosis Date  . Hypertension   . Hypercholesterolemia   . Bronchitis   . Pancreatitis   . Complication of anesthesia 12/11/11    "angry, mean, hateful after" endoscopy & colonoscopy  . Asthma ~ 1991  . Pneumonia 06/2011; 07/2011  . Diabetes mellitus type 2, controlled ([place])   . GERD (gastroesophageal reflux disease)   . GI bleeding 12/21/11  . Hx of  colonoscopy with polypectomy 12/11/11    "took out 7; couldn't get #8, that one was precancerous"  . Depression   . 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 Paralysis gastric   . Kidney stones   . Sleep apnea     uses a cpap-oxygen at night  . Stroke [facility])     PAST SURGICAL HISTORY: Past Surgical History  Procedure Laterality Date  . Cesarean section  1981; 1986  . Esophagogastroduodenoscopy  12/11/2011    Procedure: ESOPHAGOGASTRODUODENOSCOPY (EGD);  Surgeon: Missy Sabins, MD;  [facility] ENDOSCOPY;  Service: Endoscopy;  Laterality: N/A;  . Colonoscopy  12/11/2011    Procedure: COLONOSCOPY;  Surgeon: Missy Sabins, MD;  Location: [place];  Service: Endoscopy;  Laterality: N/A;  . Abdominal hysterectomy  2002  . Cholecystectomy  ~ 2002  . Tonsillectomy and adenoidectomy  1988  . Appendectomy  ~ 2002    "w/hysterectomy"  . Colonoscopy  04/26/2012    Procedure: COLONOSCOPY;  Surgeon: Wonda Horner, MD;  Location: [facility] ENDOSCOPY;  Service: Endoscopy;  Laterality: N/A;  apc  . Hernia repair      Umbilical  . Open reduction internal fixation (orif) distal radial fracture Left 01/07/2015    Procedure: OPEN REDUCTION INTERNAL FIXATION (ORIF) LEFT DISTAL RADIAL FRACTURE;  Surgeon: Milly Jakob, MD;  Location: [place];  Service: Orthopedics;  Laterality: Left;  . Knee arthroscopy with medial menisectomy Right 03/29/2015    Procedure: ARTHROSCOPY KNEE, medial menisectomy, removal of loose body;  Surgeon: Frederik Pear, MD;  Location: [place];  Service: Orthopedics;  Laterality: Right;  RIGHT KNEE ARTHROSCOPY    FAMILY HISTORY: Family History  Problem Relation Age of Onset  . Colon cancer Sister   . Alzheimer's disease Mother   . Heart disease Father     Enlarged heart    SOCIAL HISTORY: Social History   Social History  . Marital Status: Widowed    Spouse Name: N/A  . Number of Children: 2  . Years of Education:  12   Occupational History  . Not on file.   Social History Main Topics  . Smoking status: Former Smoker -- 0.12 packs/day for 6 years    Types: Cigarettes    Quit date: 12/09/1996  . Smokeless tobacco: Never Used  . Alcohol Use: No  . Drug Use: No  . Sexual Activity: No     Comment: complete hysterectomy   Other Topics Concern  . Not on file   Social History Narrative   Patient is widowed with 2 children.   Patient is right handed.   Patient has hs education.   Patient drinks 36 oz daily.     PHYSICAL EXAM  Filed Vitals:   10/18/15 0851  BP: 136/83  Pulse: 93  Height: 5\' 2"  (1.575 m)   There is no weight on file to calculate BMI.  Generalized: Well developed, well-developed obese female in no acute distress  Head: normocephalic and atraumatic,. Oropharynx benign  Neck: Supple, no carotid bruits  Cardiac: Regular rate rhythm, no murmur  Musculoskeletal: No deformity   Neurological examination   Mentation: Alert oriented to time, place, history taking. Attention span and concentration appropriate. Recent and remote memory intact.  Follows all commands speech with mild dysarthria .   Cranial nerve II-XII: Fundoscopic exam deferred .Pupils were equal round reactive to light extraocular movements were full, visual field were full on confrontational test. Facial sensation intact left facial droop. hearing was intact to finger rubbing bilaterally. Uvula tongue midline. head turning and shoulder shrug decreased on the left .Tongue protrusion into cheek strength was normal. Motor: 0/5 left upper extremity and left lower extremity, 5 over 5 right upper and right lower extremity  Sensory: normal and symmetric to light touch, pinprick, and  Vibration, on the right, diminished on the left   Coordination: finger-nose-finger, normal on the right unable to perform on the left  Reflexes: Brachioradialis 2/2, biceps 2/2, triceps 2/2, patellar 2/2, Achilles 2/2, plantar responses were  flexor bilaterally. Gait and Station: Not ambulated in wheelchair   DIAGNOSTIC DATA (  LABS, IMAGING, TESTING) - I reviewed patient records, labs, notes, testing and imaging myself where available.  Lab Results  Component Value Date   WBC 6.3 08/16/2015   HGB 12.9 08/16/2015   HCT 40.0 08/16/2015   MCV 88.5 08/16/2015   PLT 200 08/16/2015      Component Value Date/Time   NA 141 08/16/2015 1817   K 4.0 08/16/2015 1817   CL 104 08/16/2015 1817   CO2 27 08/16/2015 1817   GLUCOSE 169* 08/16/2015 1817   BUN 12 08/16/2015 1817   CREATININE 0.59 08/16/2015 1817   CALCIUM 9.4 08/16/2015 1817   PROT 7.0 08/16/2015 1817   ALBUMIN 3.4* 08/16/2015 1817   AST 37 08/16/2015 1817   ALT 36 08/16/2015 1817   ALKPHOS 58 08/16/2015 1817   BILITOT 0.5 08/16/2015 1817   GFRNONAA >60 08/16/2015 1817   GFRAA >60 08/16/2015 1817   Lab Results  Component Value Date   CHOL 237* 07/30/2015   HDL 47 07/30/2015   LDLCALC UNABLE TO CALCULATE IF TRIGLYCERIDE OVER 400 mg/dL 07/30/2015   TRIG 405* 07/30/2015   CHOLHDL 5.0 07/30/2015   Lab Results  Component Value Date   HGBA1C 6.7* 07/30/2015   No results found for: [id number] Lab Results  Component Value Date   TSH 0.569 08/08/2015   CT of the head with right thalamic hemorrhage CTA of the head and neck right P2 stenosis but otherwise unremarkable. Carotid Doppler unremarkable. 2-D echo unremarkable. LDL unable to calculate due to TG 405. Hemoglobin A1c 6.7  ASSESSMENT AND PLAN  60 y.o. year old female  has a past medical history of Hypertension; Hypercholesterolemia;  (12/11/11); Asthma (~ 1991);  Diabetes mellitus uncontrolled ; GERD (gastroesophageal reflux disease); GI bleeding (12/21/11);  Depression;  Sleep apnea; and Stroke ([place]).and right thalamic hemmorrage 07/29/2015 resulting in left hemiparesis andmild dysarthria. Hospital admission records reviewed  PLAN: Discussed with Dr. Erlinda Hong Repeat CT of the head  Hold off on restarting aspirin  at present , previously on Plavix although med record from Nursing home lists this as medication but not initialed as given Will call to clarify.  Diabetes continues CBG monitoring, A1c less than 6.5 Hypertension continue amlodipine and labetalol B/P today 136/83 Hyperlipidemia continue pravastatin and Zetia added due to high triglycerides Complicated migraine headache much better nortriptyline has been discontinued  Obstructive sleep apnea continue CPAP For depression continue Effexor and Nuedexta Left-sided weakness and mild dysarthria continue PTOT speech  Follow-up in 3 months visit time face-to-face 45 minutes Dennie Bible, [facility], [facility], APRN  [facility] [address], [place], [place] [address] [phone]

## 2015-10-18 NOTE — Telephone Encounter (Signed)
Once CT repeat shows resolution of ICH and IVH, I favor to start ASA 81mg  instead of plavix for stroke prevention.per Dr. Erlinda Hong

## 2015-10-18 NOTE — Telephone Encounter (Signed)
I called and spoke to Caryl Asp, nurse at [facility] where pt resides for rehab at this time.  [phone].  She stated that pt was admitted 08-07-15 and at that time was not on plavix.   She then left and then came back 08-22-15, she could not see written order, just that Dr. Fernande Boyden name signed and that pt was taking plavix  75mg  po daily from 08-22-15.  CM/NP notified.

## 2015-10-18 NOTE — Patient Instructions (Signed)
Per nsg home sheet 

## 2015-10-25 ENCOUNTER — Ambulatory Visit
Admission: RE | Admit: 2015-10-25 | Discharge: 2015-10-25 | Disposition: A | Discharge status: Home / Self Care | Payer: Medicare Other | Source: Ambulatory Visit | Attending: Nurse Practitioner | Admitting: Nurse Practitioner

## 2015-10-25 DIAGNOSIS — I610 Nontraumatic intracerebral hemorrhage in hemisphere, subcortical: Secondary | ICD-10-CM

## 2015-10-28 ENCOUNTER — Other Ambulatory Visit: Payer: Self-pay | Admitting: Nurse Practitioner

## 2015-10-28 NOTE — Telephone Encounter (Signed)
Pt's sister called inquiring about CT results.
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
## 2015-10-28 NOTE — Telephone Encounter (Signed)
It was not ordered by [place] nor is the effexor

## 2015-10-28 NOTE — Telephone Encounter (Signed)
Please carify meds I do not see where she is on Pradaxa. She was on Plavix when I saw her and Dr. Erlinda Hong only wants her to be on ASA .81mg .

## 2015-10-28 NOTE — Telephone Encounter (Signed)
It is Nuedexta for PBA, not pradaxa.  I need order to fax to [facility].

## 2015-10-28 NOTE — Telephone Encounter (Signed)
Yes, these were ordered by MD at [facility].   They needed order for the aspirin 81mg  po daily.

## 2015-10-28 NOTE — Telephone Encounter (Signed)
I spoke to sister,Karen again and relayed that I did speak to [place], at [facility].  I recommended that she call and speak to RN, Gregary Signs,  MD, Manager of NH, since discrepancy of information. Family trying to get her sister some help.   I told her that I sent note to Dr. Erlinda Hong as well.

## 2015-10-29 ENCOUNTER — Telehealth: Payer: Self-pay

## 2015-10-29 MED ORDER — ASPIRIN EC 81 MG PO TBEC: 81.0000 mg | DELAYED_RELEASE_TABLET | Freq: Every day | ORAL | Status: AC

## 2015-10-29 NOTE — Telephone Encounter (Signed)
Faxed prescription to [facility] for aspirin 81mg  po daily. [phone]. (successful).

## 2015-10-29 NOTE — Telephone Encounter (Signed)
Please disregard this message, I see that you already have a message on this

## 2015-10-29 NOTE — Telephone Encounter (Signed)
I spoke to sister and she is aware of results and recommendations below. I will also call [facility].  Sister: Santiago Glad says the patient has become more paranoid, angry, confuse, and threatening people. She asks for any advice, any further testing? Or recommendations? They are having a meeting with Market researcher at facility on Feb 1.

## 2015-10-29 NOTE — Telephone Encounter (Signed)
-----   Message from Dennie Bible, NP sent at 10/28/2015  7:57 AM EST ----- CT of the head shows resolution of ICH. Per Dr. Erlinda Hong previous comments please restart ASA .81mg  daily  For secondary strioke prevention. Please call the nursing home and daughter.

## 2015-10-29 NOTE — Telephone Encounter (Signed)
Just realize that ASA is on her allergy list, said hypersensitivity but then in the text it says bleeding events with 325mg  dose, unsure about 81mg ? Not sure what is the problem with ASA 81mg  thus far. But I think give her a try of ASA 81mg  is reasonable. Thanks.  Rosalin Hawking, MD PhD Stroke Neurology 10/29/2015 5:52 PM

## 2015-10-31 ENCOUNTER — Encounter (HOSPITAL_COMMUNITY): Payer: Self-pay | Admitting: Emergency Medicine

## 2015-10-31 ENCOUNTER — Emergency Department (HOSPITAL_COMMUNITY): Payer: Medicare Other

## 2015-10-31 ENCOUNTER — Observation Stay (HOSPITAL_COMMUNITY)
Admission: EM | Admit: 2015-10-31 | Discharge: 2015-11-02 | Disposition: A | Discharge status: Skilled Nursing Facility | Payer: Medicare Other | Attending: Internal Medicine | Admitting: Internal Medicine

## 2015-10-31 DIAGNOSIS — R262 Difficulty in walking, not elsewhere classified: Secondary | ICD-10-CM | POA: Insufficient documentation

## 2015-10-31 DIAGNOSIS — R55 Syncope and collapse: Principal | ICD-10-CM

## 2015-10-31 DIAGNOSIS — Z880 Allergy status to penicillin: Secondary | ICD-10-CM | POA: Diagnosis not present

## 2015-10-31 DIAGNOSIS — D696 Thrombocytopenia, unspecified: Secondary | ICD-10-CM | POA: Insufficient documentation

## 2015-10-31 DIAGNOSIS — R531 Weakness: Secondary | ICD-10-CM | POA: Insufficient documentation

## 2015-10-31 DIAGNOSIS — R410 Disorientation, unspecified: Secondary | ICD-10-CM

## 2015-10-31 DIAGNOSIS — I69354 Hemiplegia and hemiparesis following cerebral infarction affecting left non-dominant side: Secondary | ICD-10-CM | POA: Insufficient documentation

## 2015-10-31 DIAGNOSIS — R059 Cough, unspecified: Secondary | ICD-10-CM | POA: Diagnosis present

## 2015-10-31 DIAGNOSIS — Z794 Long term (current) use of insulin: Secondary | ICD-10-CM | POA: Diagnosis not present

## 2015-10-31 DIAGNOSIS — R197 Diarrhea, unspecified: Secondary | ICD-10-CM | POA: Insufficient documentation

## 2015-10-31 DIAGNOSIS — R0902 Hypoxemia: Secondary | ICD-10-CM | POA: Insufficient documentation

## 2015-10-31 DIAGNOSIS — E1159 Type 2 diabetes mellitus with other circulatory complications: Secondary | ICD-10-CM | POA: Diagnosis present

## 2015-10-31 DIAGNOSIS — Z87891 Personal history of nicotine dependence: Secondary | ICD-10-CM | POA: Insufficient documentation

## 2015-10-31 DIAGNOSIS — Z79899 Other long term (current) drug therapy: Secondary | ICD-10-CM | POA: Diagnosis not present

## 2015-10-31 DIAGNOSIS — I10 Essential (primary) hypertension: Secondary | ICD-10-CM | POA: Diagnosis not present

## 2015-10-31 DIAGNOSIS — E1151 Type 2 diabetes mellitus with diabetic peripheral angiopathy without gangrene: Secondary | ICD-10-CM | POA: Insufficient documentation

## 2015-10-31 DIAGNOSIS — G4733 Obstructive sleep apnea (adult) (pediatric): Secondary | ICD-10-CM | POA: Diagnosis not present

## 2015-10-31 DIAGNOSIS — Z7982 Long term (current) use of aspirin: Secondary | ICD-10-CM | POA: Insufficient documentation

## 2015-10-31 DIAGNOSIS — Z66 Do not resuscitate: Secondary | ICD-10-CM | POA: Insufficient documentation

## 2015-10-31 DIAGNOSIS — G9340 Encephalopathy, unspecified: Secondary | ICD-10-CM | POA: Diagnosis present

## 2015-10-31 DIAGNOSIS — R079 Chest pain, unspecified: Secondary | ICD-10-CM | POA: Diagnosis present

## 2015-10-31 DIAGNOSIS — N390 Urinary tract infection, site not specified: Secondary | ICD-10-CM | POA: Diagnosis present

## 2015-10-31 DIAGNOSIS — Z9989 Dependence on other enabling machines and devices: Secondary | ICD-10-CM

## 2015-10-31 DIAGNOSIS — Z7902 Long term (current) use of antithrombotics/antiplatelets: Secondary | ICD-10-CM | POA: Insufficient documentation

## 2015-10-31 DIAGNOSIS — R05 Cough: Secondary | ICD-10-CM | POA: Diagnosis present

## 2015-10-31 LAB — URINE MICROSCOPIC-ADD ON

## 2015-10-31 LAB — COMPREHENSIVE METABOLIC PANEL WITH GFR
ALT: 17 U/L (ref 14–54)
AST: 23 U/L (ref 15–41)
Albumin: 3.6 g/dL (ref 3.5–5.0)
Alkaline Phosphatase: 64 U/L (ref 38–126)
Anion gap: 12 (ref 5–15)
BUN: 11 mg/dL (ref 6–20)
CO2: 29 mmol/L (ref 22–32)
Calcium: 10 mg/dL (ref 8.9–10.3)
Chloride: 100 mmol/L — ABNORMAL LOW (ref 101–111)
Creatinine, Ser: 0.79 mg/dL (ref 0.44–1.00)
GFR calc Af Amer: >60 mL/min
GFR calc non Af Amer: >60 mL/min
Glucose, Bld: 167 mg/dL — ABNORMAL HIGH (ref 65–99)
Potassium: 3.5 mmol/L (ref 3.5–5.1)
Sodium: 141 mmol/L (ref 135–145)
Total Bilirubin: 0.1 mg/dL — ABNORMAL LOW (ref 0.3–1.2)
Total Protein: 6.9 g/dL (ref 6.5–8.1)

## 2015-10-31 LAB — CBC
HEMATOCRIT: 42.6 % (ref 36.0–46.0)
Hemoglobin: 13.7 g/dL (ref 12.0–15.0)
MCH: 28.1 pg (ref 26.0–34.0)
MCHC: 32.2 g/dL (ref 30.0–36.0)
MCV: 87.3 fL (ref 78.0–100.0)
Platelets: 163 10*3/uL (ref 150–400)
RBC: 4.88 MIL/uL (ref 3.87–5.11)
RDW: 13.9 % (ref 11.5–15.5)
WBC: 9.7 10*3/uL (ref 4.0–10.5)

## 2015-10-31 LAB — URINALYSIS, ROUTINE W REFLEX MICROSCOPIC
Bilirubin Urine: NEGATIVE
Glucose, UA: NEGATIVE mg/dL
Hgb urine dipstick: NEGATIVE
Ketones, ur: NEGATIVE mg/dL
Nitrite: NEGATIVE
Protein, ur: 100 mg/dL — AB
Specific Gravity, Urine: 1.021 (ref 1.005–1.030)
pH: 5 (ref 5.0–8.0)

## 2015-10-31 LAB — MAGNESIUM: Magnesium: 2 mg/dL (ref 1.7–2.4)

## 2015-10-31 LAB — I-STAT CHEM 8, ED
BUN: 14 mg/dL (ref 6–20)
CHLORIDE: 99 mmol/L — AB (ref 101–111)
CREATININE: 0.7 mg/dL (ref 0.44–1.00)
Calcium, Ion: 1.2 mmol/L (ref 1.12–1.23)
GLUCOSE: 163 mg/dL — AB (ref 65–99)
HEMATOCRIT: 44 % (ref 36.0–46.0)
HEMOGLOBIN: 15 g/dL (ref 12.0–15.0)
POTASSIUM: 3.4 mmol/L — AB (ref 3.5–5.1)
Sodium: 141 mmol/L (ref 135–145)
TCO2: 30 mmol/L (ref 0–100)

## 2015-10-31 LAB — I-STAT VENOUS BLOOD GAS, ED
Acid-Base Excess: 3 mmol/L — ABNORMAL HIGH (ref 0.0–2.0)
Bicarbonate: 29.9 meq/L — ABNORMAL HIGH (ref 20.0–24.0)
O2 Saturation: 95 %
TCO2: 31 mmol/L (ref 0–100)
pCO2, Ven: 53.4 mmHg — ABNORMAL HIGH (ref 45.0–50.0)
pH, Ven: 7.356 — ABNORMAL HIGH (ref 7.250–7.300)
pO2, Ven: 82 mmHg — ABNORMAL HIGH (ref 30.0–45.0)

## 2015-10-31 LAB — AMMONIA: Ammonia: 35 umol/L (ref 9–35)

## 2015-10-31 LAB — I-STAT TROPONIN, ED: Troponin i, poc: 0 ng/mL (ref 0.00–0.08)

## 2015-10-31 NOTE — ED Notes (Addendum)
PT experienced central chest pain at assisted living facility [facility]. Pain radiated to left arm and was associated with SOB and nausea. PT was given a nitro tablet and experienced a syncopal episode. PT reports the nitro helped to ease her chest pain. PT was alert and oriented when rescue arrived. PT has severe left side deficits due to a stroke in November. PT reports no chest pain, SOB, or nausea at triage

## 2015-10-31 NOTE — ED Provider Notes (Signed)
CSN: [id number]     Arrival date & time 10/31/15  2111 History   First MD Initiated Contact with Patient 10/31/15 2155     Chief Complaint  Patient presents with  . Chest Pain     (Consider location/radiation/quality/duration/timing/severity/associated sxs/prior Treatment) Patient is a 60 y.o. female presenting with chest pain. The history is provided by the patient and a relative.  Chest Pain Pain location:  L chest Pain quality: pressure   Pain radiates to:  L arm Pain radiates to the back: no   Pain severity:  Mild Onset quality:  Sudden Duration: 40 min pta after "activities class" at ALF. Timing:  Constant Progression:  Waxing and waning Chronicity:  New Context: movement and at rest   Context: not breathing   Relieved by:  Nitroglycerin Worsened by:  Nothing tried Ineffective treatments:  None tried Associated symptoms: abdominal pain and altered mental status   Associated symptoms: no cough, no fever, no headache, no nausea and no shortness of breath     Past Medical History  Diagnosis Date  . Hypertension   . Hypercholesterolemia   . Bronchitis   . Pancreatitis   . Complication of anesthesia 12/11/11    "angry, mean, hateful after" endoscopy & colonoscopy  . Asthma ~ 1991  . Pneumonia 06/2011; 07/2011  . Diabetes mellitus type 2, controlled ([place])   . GERD (gastroesophageal reflux disease)   . GI bleeding 12/21/11  . Hx of colonoscopy with polypectomy 12/11/11    "took out 7; couldn't get #8, that one was precancerous"  . Depression   . 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 Paralysis gastric   . Kidney stones   . Sleep apnea     uses a cpap-oxygen at night  . Stroke [facility])    Past Surgical History  Procedure Laterality Date  . Cesarean section  1981; 1986  . Esophagogastroduodenoscopy  12/11/2011    Procedure: ESOPHAGOGASTRODUODENOSCOPY (EGD);  Surgeon: Missy Sabins, MD;  Location: [facility] ENDOSCOPY;  Service: Endoscopy;   Laterality: N/A;  . Colonoscopy  12/11/2011    Procedure: COLONOSCOPY;  Surgeon: Missy Sabins, MD;  Location: [place];  Service: Endoscopy;  Laterality: N/A;  . Abdominal hysterectomy  2002  . Cholecystectomy  ~ 2002  . Tonsillectomy and adenoidectomy  1988  . Appendectomy  ~ 2002    "w/hysterectomy"  . Colonoscopy  04/26/2012    Procedure: COLONOSCOPY;  Surgeon: Wonda Horner, MD;  Location: [facility] ENDOSCOPY;  Service: Endoscopy;  Laterality: N/A;  apc  . Hernia repair      Umbilical  . Open reduction internal fixation (orif) distal radial fracture Left 01/07/2015    Procedure: OPEN REDUCTION INTERNAL FIXATION (ORIF) LEFT DISTAL RADIAL FRACTURE;  Surgeon: Milly Jakob, MD;  Location: [place];  Service: Orthopedics;  Laterality: Left;  . Knee arthroscopy with medial menisectomy Right 03/29/2015    Procedure: ARTHROSCOPY KNEE, medial menisectomy, removal of loose body;  Surgeon: Frederik Pear, MD;  Location: [place];  Service: Orthopedics;  Laterality: Right;  RIGHT KNEE ARTHROSCOPY   Family History  Problem Relation Age of Onset  . Colon cancer Sister   . Alzheimer's disease Mother   . Heart disease Father     Enlarged heart   Social History  Substance Use Topics  . Smoking status: Former Smoker -- 0.12 packs/day for 6 years    Types: Cigarettes    Quit date:  12/09/1996  . Smokeless tobacco: Never Used  . Alcohol Use: No   OB History    No data available     Review of Systems  Constitutional: Negative for fever and chills.  HENT: Negative.   Eyes: Negative for visual disturbance.  Respiratory: Negative for cough and shortness of breath.   Cardiovascular: Positive for chest pain.  Gastrointestinal: Positive for abdominal pain. Negative for nausea and diarrhea.  Genitourinary: Positive for dysuria.  Musculoskeletal: Negative.   Skin: Negative for pallor, rash and wound.  Neurological: Positive for syncope (after taking nitro at facility ).  Negative for headaches.      Allergies  Aspirin; Codeine; Penicillins; Reglan; Xifaxan; Zocor; and Crestor  Home Medications   Prior to Admission medications   Medication Sig Start Date End Date Taking? Authorizing Provider  acetaminophen (TYLENOL) 500 MG tablet Take 1,000 mg by mouth daily as needed for fever or headache (headache).    Yes Historical Provider, MD  aluminum-magnesium hydroxide-simethicone (MAALOX) [id number] MG/5ML SUSP Take 30 mLs by mouth every 4 (four) hours as needed.   Yes Historical Provider, MD  amLODipine (NORVASC) 5 MG tablet Take 5 mg by mouth daily.   Yes Historical Provider, MD  aspirin EC 81 MG tablet Take 1 tablet (81 mg total) by mouth daily. 10/29/15  Yes Dennie Bible, NP  clopidogrel (PLAVIX) 75 MG tablet Take 75 mg by mouth daily.   Yes Historical Provider, MD  Dextromethorphan-Quinidine (NUEDEXTA) 20-10 MG CAPS Take by mouth 2 (two) times daily.   Yes Historical Provider, MD  fluticasone (FLONASE) 50 MCG/ACT nasal spray Place 2 sprays into both nostrils daily as needed for allergies.  11/12/14  Yes Historical Provider, MD  gabapentin (NEURONTIN) 300 MG capsule Take 2 capsules (600 mg total) by mouth 3 (three) times daily. Patient taking differently: Take 1,200 mg by mouth 3 (three) times daily.  04/17/15  Yes Wallene Huh, DPM  hydrALAZINE (APRESOLINE) 50 MG tablet Take 1 tablet (50 mg total) by mouth every 8 (eight) hours. 08/07/15  Yes Donzetta Starch, NP  insulin detemir (LEVEMIR) 100 UNIT/ML injection Inject 0.4 mLs (40 Units total) into the skin daily. Patient taking differently: Inject 40 Units into the skin every evening.  08/07/15  Yes Donzetta Starch, NP  labetalol (NORMODYNE) 100 MG tablet Take 1 tablet (100 mg total) by mouth 2 (two) times daily. 08/10/15  Yes Thurnell Lose, MD  lisinopril (PRINIVIL,ZESTRIL) 10 MG tablet Take 1 tablet (10 mg total) by mouth 2 (two) times daily. Patient taking differently: Take 20 mg by mouth 2 (two) times  daily.  08/10/15  Yes Thurnell Lose, MD  LORazepam (ATIVAN) 0.5 MG tablet Take 1 tablet (0.5 mg total) by mouth 4 (four) times daily. FOR ANXIETY 08/07/15  Yes Donzetta Starch, NP  losartan (COZAAR) 100 MG tablet Take 100 mg by mouth daily.   Yes Historical Provider, MD  meclizine (ANTIVERT) 12.5 MG tablet Take 12.5 mg by mouth 3 (three) times daily as needed for dizziness.   Yes Historical Provider, MD  metFORMIN (GLUCOPHAGE-XR) 500 MG 24 hr tablet Take 500 mg by mouth daily with breakfast.   Yes Historical Provider, MD  ondansetron (ZOFRAN-ODT) 4 MG disintegrating tablet Take 1 tablet (4 mg total) by mouth every 8 (eight) hours as needed for nausea or vomiting. 08/16/15  Yes Davonna Belling, MD  polyvinyl alcohol (LIQUIFILM TEARS) 1.4 % ophthalmic solution Place 1 drop into both eyes 4 (four) times daily. Reported on 11/01/2015  Yes Historical Provider, MD  pravastatin (PRAVACHOL) 80 MG tablet Take 1 tablet (80 mg total) by mouth daily. Patient taking differently: Take 80 mg by mouth at bedtime.  08/07/15 08/06/16 Yes Donzetta Starch, NP  PROAIR HFA 108 (90 BASE) MCG/ACT inhaler USE 2 PUFFS EVERY 6 HOURS AS NEEDED FOR WHEEZING   Yes Clinton D Young, MD  senna-docusate (SENOKOT-S) 8.6-50 MG tablet Take 1 tablet by mouth 2 (two) times daily. 08/07/15  Yes Donzetta Starch, NP  tiZANidine (ZANAFLEX) 4 MG capsule Take 4 mg by mouth 4 (four) times daily as needed for muscle spasms.   Yes Historical Provider, MD  traMADol (ULTRAM) 50 MG tablet Take 50 mg by mouth 3 (three) times daily.   Yes Historical Provider, MD  venlafaxine XR (EFFEXOR-XR) 150 MG 24 hr capsule Take 150 mg by mouth daily with breakfast.   Yes Historical Provider, MD   BP 135/82 mmHg  Pulse 91  Temp(Src) 98.3 F (36.8 C) (Oral)  Resp 17  Ht 5\' 2"  (1.575 m)  Wt 77.2 kg  BMI 31.12 kg/m2  SpO2 98% Physical Exam  Constitutional: Vital signs are normal. She appears well-developed. She appears lethargic. She appears ill. Nasal cannula in  place.  HENT:  Head: Normocephalic and atraumatic.  Mouth/Throat: No oropharyngeal exudate.  Eyes: Conjunctivae are normal. Pupils are equal, round, and reactive to light. No scleral icterus.  Neck: No tracheal deviation present.  Cardiovascular: Normal rate, regular rhythm, normal heart sounds and intact distal pulses.  Exam reveals no gallop and no friction rub.   No murmur heard. Pulmonary/Chest: Effort normal and breath sounds normal. No stridor. No respiratory distress. She has no wheezes. She has no rales. She exhibits no tenderness.  Abdominal: Soft. She exhibits no distension. There is tenderness (suprapubic). There is no rebound and no guarding.  Musculoskeletal: Normal range of motion. She exhibits no edema or tenderness.  Neurological: She appears lethargic. No cranial nerve deficit or sensory deficit. GCS eye subscore is 4. GCS verbal subscore is 4. GCS motor subscore is 6.  Residual left sided weakness from prior cva  Skin: Skin is warm and dry. No rash noted. No erythema. No pallor.  Vitals reviewed.   ED Course  Procedures (including critical care time) Labs Review Labs Reviewed  COMPREHENSIVE METABOLIC PANEL - Abnormal; Notable for the following:    Chloride 100 (*)    Glucose, Bld 167 (*)    Total Bilirubin 0.1 (*)    All other components within normal limits  URINALYSIS, ROUTINE W REFLEX MICROSCOPIC (NOT AT [facility]) - Abnormal; Notable for the following:    APPearance CLOUDY (*)    Protein, ur 100 (*)    Leukocytes, UA MODERATE (*)    All other components within normal limits  URINE MICROSCOPIC-ADD ON - Abnormal; Notable for the following:    Squamous Epithelial / LPF 0-5 (*)    Bacteria, UA MANY (*)    Casts HYALINE CASTS (*)    Crystals CA OXALATE CRYSTALS (*)    All other components within normal limits  COMPREHENSIVE METABOLIC PANEL - Abnormal; Notable for the following:    Glucose, Bld 129 (*)    Albumin 3.4 (*)    Total Bilirubin 0.2 (*)    All other  components within normal limits  CBC WITH DIFFERENTIAL/PLATELET - Abnormal; Notable for the following:    Platelets 147 (*)    All other components within normal limits  GLUCOSE, CAPILLARY - Abnormal; Notable for the following:  Glucose-Capillary 121 (*)    All other components within normal limits  I-STAT CHEM 8, ED - Abnormal; Notable for the following:    Potassium 3.4 (*)    Chloride 99 (*)    Glucose, Bld 163 (*)    All other components within normal limits  I-STAT VENOUS BLOOD GAS, ED - Abnormal; Notable for the following:    pH, Ven 7.356 (*)    pCO2, Ven 53.4 (*)    pO2, Ven 82.0 (*)    Bicarbonate 29.9 (*)    Acid-Base Excess 3.0 (*)    All other components within normal limits  I-STAT ARTERIAL BLOOD GAS, ED - Abnormal; Notable for the following:    pH, Arterial 7.345 (*)    pCO2 arterial 55.3 (*)    Bicarbonate 30.2 (*)    Acid-Base Excess 3.0 (*)    All other components within normal limits  MRSA PCR SCREENING  URINE CULTURE  CBC  MAGNESIUM  AMMONIA  TROPONIN I  TROPONIN I  BLOOD GAS, VENOUS  TROPONIN I  CBC  URINE RAPID DRUG SCREEN, HOSP PERFORMED  I-STAT TROPOININ, ED  I-STAT TROPOININ, ED  Randolm Idol, ED    Imaging Review Dg Chest 2 View  10/31/2015  CLINICAL DATA:  Chest pain earlier today.  Initial encounter. EXAM: CHEST  2 VIEW COMPARISON:  Single view of the chest 08/16/2015 PA and lateral chest 10/01/2014. FINDINGS: The lungs are clear. Asymmetric elevation of the left hemidiaphragm relative to the right is unchanged. Heart size is normal. No pneumothorax or pleural effusion. IMPRESSION: No acute disease. Electronically Signed   By: Inge Rise M.D.   On: 10/31/2015 22:45    EKG Interpretation   Date/Time:  Thursday October 31 2015 21:20:24 EST Ventricular Rate:  95 PR Interval:  157 QRS Duration: 94 QT Interval:  367 QTC Calculation: 461 R Axis:   10 Text Interpretation:  Sinus rhythm Abnormal R-wave progression, early   transition No significant change since last tracing Confirmed by Maryan Rued   MD, Loree Fee ([id number]) on 10/31/2015 10:16:40 PM      MDM   Final diagnoses:  Chest pain, unspecified chest pain type  Disorientation  UTI (lower urinary tract infection)    Patient is a 61 year old female with a history of stroke with left-sided weakness who presents from her assisted living facility for concerns left-sided chest pain. Upon arrival patient has stable vital signs but doesn't appear to be somnolent with difficulty arousing. She is requiring 3-4 L nasal cannula O2 for desatted into the high 80s. No history of O2 use prior. Further history and exam as above. During patient's stay her mental status improved to her baseline per her family. She is able to answer all questions and states that her chest pain happened after she was in her activities class today. Pain was relieved with nitroglycerin that was given prehospital but resulted in a syncopal episode. Chest x-ray without acute process. Labs reveal a respiratory acidosis and his UTI. Troponin is negative and EKG without acute ischemic changes. Concern for ACS at this time as well as possible delirium from UTI. Patient will be admitted to medicine for concerns of UTI and also ACS rule out given an high heart score. CT PE study and head ct will be obtained per request of primary team.    Heriberto Antigua, MD 11/01/15 [place], MD 11/02/15 [phone]

## 2015-11-01 ENCOUNTER — Other Ambulatory Visit (HOSPITAL_COMMUNITY): Payer: Medicare Other

## 2015-11-01 ENCOUNTER — Encounter (HOSPITAL_COMMUNITY): Payer: Self-pay | Admitting: Radiology

## 2015-11-01 ENCOUNTER — Observation Stay (HOSPITAL_BASED_OUTPATIENT_CLINIC_OR_DEPARTMENT_OTHER): Payer: Medicare Other

## 2015-11-01 ENCOUNTER — Observation Stay (HOSPITAL_COMMUNITY): Payer: Medicare Other

## 2015-11-01 DIAGNOSIS — R55 Syncope and collapse: Secondary | ICD-10-CM

## 2015-11-01 DIAGNOSIS — R079 Chest pain, unspecified: Secondary | ICD-10-CM

## 2015-11-01 DIAGNOSIS — G9340 Encephalopathy, unspecified: Secondary | ICD-10-CM

## 2015-11-01 DIAGNOSIS — N390 Urinary tract infection, site not specified: Secondary | ICD-10-CM

## 2015-11-01 LAB — I-STAT ARTERIAL BLOOD GAS, ED
ACID-BASE EXCESS: 3 mmol/L — AB (ref 0.0–2.0)
Bicarbonate: 30.2 mEq/L — ABNORMAL HIGH (ref 20.0–24.0)
O2 SAT: 95 %
PCO2 ART: 55.3 mmHg — AB (ref 35.0–45.0)
PO2 ART: 81 mmHg (ref 80.0–100.0)
Patient temperature: 98.5
TCO2: 32 mmol/L (ref 0–100)
pH, Arterial: 7.345 — ABNORMAL LOW (ref 7.350–7.450)

## 2015-11-01 LAB — CBC WITH DIFFERENTIAL/PLATELET
Basophils Absolute: 0 10*3/uL (ref 0.0–0.1)
Basophils Relative: 0 %
EOS PCT: 1 %
Eosinophils Absolute: 0.1 10*3/uL (ref 0.0–0.7)
HCT: 41.7 % (ref 36.0–46.0)
Hemoglobin: 13.1 g/dL (ref 12.0–15.0)
LYMPHS ABS: 1.6 10*3/uL (ref 0.7–4.0)
LYMPHS PCT: 28 %
MCH: 27.9 pg (ref 26.0–34.0)
MCHC: 31.4 g/dL (ref 30.0–36.0)
MCV: 88.9 fL (ref 78.0–100.0)
MONO ABS: 0.4 10*3/uL (ref 0.1–1.0)
MONOS PCT: 7 %
Neutro Abs: 3.7 10*3/uL (ref 1.7–7.7)
Neutrophils Relative %: 64 %
PLATELETS: 147 10*3/uL — AB (ref 150–400)
RBC: 4.69 MIL/uL (ref 3.87–5.11)
RDW: 14.1 % (ref 11.5–15.5)
WBC: 5.8 10*3/uL (ref 4.0–10.5)

## 2015-11-01 LAB — RAPID URINE DRUG SCREEN, HOSP PERFORMED
AMPHETAMINES: NOT DETECTED
Barbiturates: NOT DETECTED
Benzodiazepines: POSITIVE — AB
Cocaine: NOT DETECTED
Opiates: NOT DETECTED
Tetrahydrocannabinol: NOT DETECTED

## 2015-11-01 LAB — COMPREHENSIVE METABOLIC PANEL
ALBUMIN: 3.4 g/dL — AB (ref 3.5–5.0)
ALT: 16 U/L (ref 14–54)
AST: 21 U/L (ref 15–41)
Alkaline Phosphatase: 63 U/L (ref 38–126)
Anion gap: 7 (ref 5–15)
BUN: 10 mg/dL (ref 6–20)
CHLORIDE: 105 mmol/L (ref 101–111)
CO2: 30 mmol/L (ref 22–32)
Calcium: 9.5 mg/dL (ref 8.9–10.3)
Creatinine, Ser: 0.51 mg/dL (ref 0.44–1.00)
GFR calc Af Amer: >60 mL/min (ref >60)
GFR calc non Af Amer: >60 mL/min (ref >60)
GLUCOSE: 129 mg/dL — AB (ref 65–99)
POTASSIUM: 3.7 mmol/L (ref 3.5–5.1)
Sodium: 142 mmol/L (ref 135–145)
Total Bilirubin: 0.2 mg/dL — ABNORMAL LOW (ref 0.3–1.2)
Total Protein: 6.8 g/dL (ref 6.5–8.1)

## 2015-11-01 LAB — I-STAT TROPONIN, ED: TROPONIN I, POC: 0 ng/mL (ref 0.00–0.08)

## 2015-11-01 LAB — TROPONIN I: Troponin I: <0.03 ng/mL (ref <0.031)

## 2015-11-01 LAB — GLUCOSE, CAPILLARY
GLUCOSE-CAPILLARY: 121 mg/dL — AB (ref 65–99)
GLUCOSE-CAPILLARY: 113 mg/dL — AB (ref 65–99)
Glucose-Capillary: 114 mg/dL — ABNORMAL HIGH (ref 65–99)

## 2015-11-01 LAB — MRSA PCR SCREENING: MRSA by PCR: NEGATIVE

## 2015-11-01 MED ORDER — ACETAMINOPHEN 325 MG PO TABS
650.0000 mg | ORAL_TABLET | Freq: Four times a day (QID) | ORAL | Status: DC | PRN
Start: 2015-11-01 — End: 2015-11-02
  Administered 2015-11-01 – 2015-11-02 (×2): 650 mg via ORAL
  Filled 2015-11-01 (×3): qty 2

## 2015-11-01 MED ORDER — AMLODIPINE BESYLATE 5 MG PO TABS
5.0000 mg | ORAL_TABLET | Freq: Every day | ORAL | Status: DC
  Administered 2015-11-01 – 2015-11-02 (×2): 5 mg via ORAL
  Filled 2015-11-01 (×2): qty 1

## 2015-11-01 MED ORDER — TIZANIDINE HCL 4 MG PO TABS
4.0000 mg | ORAL_TABLET | Freq: Four times a day (QID) | ORAL | Status: DC | PRN
  Filled 2015-11-01: qty 1

## 2015-11-01 MED ORDER — DOXYCYCLINE HYCLATE 100 MG PO TABS
100.0000 mg | ORAL_TABLET | Freq: Two times a day (BID) | ORAL | Status: DC
  Administered 2015-11-01 – 2015-11-02 (×3): 100 mg via ORAL
  Filled 2015-11-01 (×3): qty 1

## 2015-11-01 MED ORDER — DOXYCYCLINE HYCLATE 100 MG IV SOLR
100.0000 mg | Freq: Two times a day (BID) | INTRAVENOUS | Status: DC
  Filled 2015-11-01: qty 100

## 2015-11-01 MED ORDER — DOXYCYCLINE HYCLATE 100 MG PO TABS: 100.0000 mg | ORAL_TABLET | Freq: Two times a day (BID) | ORAL | Status: DC

## 2015-11-01 MED ORDER — VENLAFAXINE HCL ER 150 MG PO CP24
150.0000 mg | ORAL_CAPSULE | Freq: Every day | ORAL | Status: DC
  Administered 2015-11-01 – 2015-11-02 (×2): 150 mg via ORAL
  Filled 2015-11-01 (×4): qty 1

## 2015-11-01 MED ORDER — INSULIN ASPART 100 UNIT/ML ~~LOC~~ SOLN
0.0000 [IU] | Freq: Three times a day (TID) | SUBCUTANEOUS | Status: DC
  Administered 2015-11-01 – 2015-11-02 (×2): 1 [IU] via SUBCUTANEOUS

## 2015-11-01 MED ORDER — ONDANSETRON HCL 4 MG/2ML IJ SOLN: 4.0000 mg | Freq: Four times a day (QID) | INTRAMUSCULAR | Status: DC | PRN

## 2015-11-01 MED ORDER — MECLIZINE HCL 12.5 MG PO TABS
12.5000 mg | ORAL_TABLET | Freq: Three times a day (TID) | ORAL | Status: DC | PRN
Start: 2015-11-01 — End: 2015-11-01

## 2015-11-01 MED ORDER — HYDRALAZINE HCL 20 MG/ML IJ SOLN: 10.0000 mg | INTRAMUSCULAR | Status: DC | PRN

## 2015-11-01 MED ORDER — ACETAMINOPHEN 650 MG RE SUPP: 650.0000 mg | Freq: Four times a day (QID) | RECTAL | Status: DC | PRN

## 2015-11-01 MED ORDER — LISINOPRIL 20 MG PO TABS
20.0000 mg | ORAL_TABLET | Freq: Two times a day (BID) | ORAL | Status: DC
  Administered 2015-11-01 – 2015-11-02 (×3): 20 mg via ORAL
  Filled 2015-11-01 (×3): qty 1

## 2015-11-01 MED ORDER — HYDRALAZINE HCL 50 MG PO TABS
50.0000 mg | ORAL_TABLET | Freq: Three times a day (TID) | ORAL | Status: DC
  Administered 2015-11-01 – 2015-11-02 (×5): 50 mg via ORAL
  Filled 2015-11-01 (×5): qty 1

## 2015-11-01 MED ORDER — CETYLPYRIDINIUM CHLORIDE 0.05 % MT LIQD
7.0000 mL | Freq: Two times a day (BID) | OROMUCOSAL | Status: DC
  Administered 2015-11-01 – 2015-11-02 (×2): 7 mL via OROMUCOSAL

## 2015-11-01 MED ORDER — LOSARTAN POTASSIUM 50 MG PO TABS
100.0000 mg | ORAL_TABLET | Freq: Every day | ORAL | Status: DC
  Administered 2015-11-01 – 2015-11-02 (×2): 100 mg via ORAL
  Filled 2015-11-01 (×2): qty 2

## 2015-11-01 MED ORDER — ONDANSETRON HCL 4 MG PO TABS
4.0000 mg | ORAL_TABLET | Freq: Four times a day (QID) | ORAL | Status: DC | PRN
  Administered 2015-11-02: 4 mg via ORAL
  Filled 2015-11-01: qty 1

## 2015-11-01 MED ORDER — PRAVASTATIN SODIUM 40 MG PO TABS
80.0000 mg | ORAL_TABLET | Freq: Every day | ORAL | Status: DC
  Administered 2015-11-01: 80 mg via ORAL
  Filled 2015-11-01: qty 2

## 2015-11-01 MED ORDER — SENNOSIDES-DOCUSATE SODIUM 8.6-50 MG PO TABS
1.0000 | ORAL_TABLET | Freq: Two times a day (BID) | ORAL | Status: DC
  Administered 2015-11-01: 1 via ORAL
  Filled 2015-11-01 (×3): qty 1

## 2015-11-01 MED ORDER — CLOPIDOGREL BISULFATE 75 MG PO TABS
75.0000 mg | ORAL_TABLET | Freq: Every day | ORAL | Status: DC
  Administered 2015-11-01 – 2015-11-02 (×2): 75 mg via ORAL
  Filled 2015-11-01 (×3): qty 1

## 2015-11-01 MED ORDER — FLUTICASONE PROPIONATE 50 MCG/ACT NA SUSP: 2.0000 | Freq: Every day | NASAL | Status: DC | PRN

## 2015-11-01 MED ORDER — NITROFURANTOIN MONOHYD MACRO 100 MG PO CAPS
100.0000 mg | ORAL_CAPSULE | Freq: Once | ORAL | Status: AC
  Administered 2015-11-01: 100 mg via ORAL
  Filled 2015-11-01: qty 1

## 2015-11-01 MED ORDER — ASPIRIN EC 81 MG PO TBEC
81.0000 mg | DELAYED_RELEASE_TABLET | Freq: Every day | ORAL | Status: DC
  Administered 2015-11-01: 81 mg via ORAL
  Filled 2015-11-01 (×2): qty 1

## 2015-11-01 MED ORDER — GABAPENTIN 300 MG PO CAPS: 600.0000 mg | ORAL_CAPSULE | Freq: Three times a day (TID) | ORAL | Status: DC

## 2015-11-01 MED ORDER — LOSARTAN POTASSIUM 50 MG PO TABS: 100.0000 mg | ORAL_TABLET | Freq: Every day | ORAL | Status: DC

## 2015-11-01 MED ORDER — CHLORHEXIDINE GLUCONATE 0.12 % MT SOLN
15.0000 mL | Freq: Two times a day (BID) | OROMUCOSAL | Status: DC
  Administered 2015-11-01 – 2015-11-02 (×3): 15 mL via OROMUCOSAL
  Filled 2015-11-01 (×2): qty 15

## 2015-11-01 MED ORDER — LORAZEPAM 0.5 MG PO TABS
0.5000 mg | ORAL_TABLET | Freq: Four times a day (QID) | ORAL | Status: DC | PRN
  Administered 2015-11-01: 0.5 mg via ORAL
  Filled 2015-11-01: qty 1

## 2015-11-01 MED ORDER — IOHEXOL 300 MG/ML  SOLN
100.0000 mL | Freq: Once | INTRAMUSCULAR | Status: AC | PRN
  Administered 2015-11-01: 100 mL via INTRAVENOUS

## 2015-11-01 MED ORDER — LABETALOL HCL 100 MG PO TABS
100.0000 mg | ORAL_TABLET | Freq: Two times a day (BID) | ORAL | Status: DC
  Administered 2015-11-01 – 2015-11-02 (×3): 100 mg via ORAL
  Filled 2015-11-01 (×3): qty 1

## 2015-11-01 MED ORDER — CIPROFLOXACIN HCL 500 MG PO TABS: 500.0000 mg | ORAL_TABLET | Freq: Two times a day (BID) | ORAL | Status: DC

## 2015-11-01 MED ORDER — ENOXAPARIN SODIUM 40 MG/0.4ML ~~LOC~~ SOLN
40.0000 mg | SUBCUTANEOUS | Status: DC
  Administered 2015-11-01: 40 mg via SUBCUTANEOUS
  Filled 2015-11-01: qty 0.4

## 2015-11-01 MED ORDER — ALBUTEROL SULFATE (2.5 MG/3ML) 0.083% IN NEBU: 3.0000 mL | INHALATION_SOLUTION | RESPIRATORY_TRACT | Status: DC | PRN

## 2015-11-01 MED ORDER — INSULIN DETEMIR 100 UNIT/ML ~~LOC~~ SOLN
40.0000 [IU] | Freq: Every evening | SUBCUTANEOUS | Status: DC
  Administered 2015-11-01: 40 [IU] via SUBCUTANEOUS
  Filled 2015-11-01 (×3): qty 0.4

## 2015-11-01 NOTE — ED Notes (Signed)
Dr. Hal Hope wants patient placed on CPAP after ABG is drawn. Respiratory tech called to bedside to draw the ABG.

## 2015-11-01 NOTE — Progress Notes (Signed)
Nutrition Brief Note  Patient identified on the Malnutrition Screening Tool (MST) Report  Wt Readings from Last 15 Encounters:  11/01/15 170 lb 3.1 oz (77.2 kg)  08/10/15 186 lb 4.6 oz (84.5 kg)  08/06/15 192 lb 14.4 oz (87.5 kg)  04/15/15 193 lb 9.6 oz (87.816 kg)  03/29/15 198 lb 6.4 oz (89.994 kg)  03/15/15 194 lb (87.998 kg)  02/08/15 188 lb (85.276 kg)  01/07/15 193 lb 8 oz (87.771 kg)  12/11/14 197 lb 9.6 oz (89.631 kg)  11/15/14 197 lb (89.359 kg)  11/08/14 197 lb 14.4 oz (89.767 kg)  10/30/14 196 lb (88.905 kg)  10/16/14 195 lb 9.6 oz (88.724 kg)  10/01/14 197 lb 1.5 oz (89.4 kg)  10/01/14 197 lb (89.359 kg)   Destiny Robinson is a 60 y.o. 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 She was having chest pain while getting PT. She states that every time PT works with her left arm, she feels chest pain . She also states that every time she becomes anxious, she has chest pain. She received a Nitroglycerin for her pain and then passed out. She was quite confused in the ER and hypoxic. She was found to have a UTI.   Spoke with pt who reports she does not feel well. She reports good appetite PTA ("I was clearing my plate and eating everything"). Noted 100% meal completion.   Pt endorses weight loss, but unsure of etiology. Pt refused nutrition-focused physical exam.   Body mass index is 31.12 kg/(m^2). Patient meets criteria for obesity, class I based on current BMI.   Current diet order is Heart Healthy/ Carb Modified, patient is consuming approximately 100% of meals at this time. Labs and medications reviewed.   No nutrition interventions warranted at this time. If nutrition issues arise, please consult RD.   Destiny Robinson, RD, LDN, CDE Pager: [phone] After hours Pager: [phone]

## 2015-11-01 NOTE — Progress Notes (Signed)
RT entered room to place patient on CPAP and patient begin to argue with RT about not wearing the CPAP because it wasn't her CPAP.  RT advised patient that the CPAP in the room was the CPAP that was provided by the hospital and she wore it this AM when she was admitted and patient stated that she wasn't wearing. RT advised patient that she wasn't going to argue with her and would advise the RN that she wasn't going to wear the CPAP.

## 2015-11-01 NOTE — ED Notes (Signed)
Report attempted 

## 2015-11-01 NOTE — H&P (Signed)
[facility] History and Physical  Destiny Robinson [id number] DOB: 1956-04-17 DOA: 10/31/2015  Referring physician: Mr.[email or web address].PA. PCP: [name], MD  Specialists: Neurology.  Chief Complaint: Chest pain.  History obtained from ER physician as patient appears confused.  HPI: Destiny Robinson is a 60 y.o. 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 As per the ER physician patient had complained of left-sided chest pain for which patient was given sublingual nitroglycerin. After the sublingual nitroglycerin was given patient had a syncopal episode. Patient's blood pressure also dropped at that time. When patient is ER patient was appearing confused. CT of the head and CT angiogram of the chest are unremarkable. Patient was found to be hypoxic and has been placed on CPAP. On exam patient appears still confused and drowsy. Patient will be admitted for further management of her chest pain and acute encephalopathy. Urine has lot of white blood cell count.   Review of Systems: As presented in the history of presenting illness, rest negative.  Past Medical History  Diagnosis Date  . Hypertension   . Hypercholesterolemia   . Bronchitis   . Pancreatitis   . Complication of anesthesia 12/11/11    "angry, mean, hateful after" endoscopy & colonoscopy  . Asthma ~ 1991  . Pneumonia 06/2011; 07/2011  . Diabetes mellitus type 2, controlled ([place])   . GERD (gastroesophageal reflux disease)   . GI bleeding 12/21/11  . Hx of colonoscopy with polypectomy 12/11/11    "took out 7; couldn't get #8, that one was precancerous"  . Depression   . Cancer [facility])     "female parts; they got it all when I had hysterectomy"  . Paralysis gastric   . Kidney stones   . Sleep apnea     uses a cpap-oxygen at night  . Stroke [facility])     Past Surgical History  Procedure Laterality Date  . Cesarean section  1981; 1986  . Esophagogastroduodenoscopy  12/11/2011    Procedure: ESOPHAGOGASTRODUODENOSCOPY (EGD);  Surgeon: Missy Sabins, MD;  Location: [facility] ENDOSCOPY;  Service: Endoscopy;  Laterality: N/A;  . Colonoscopy  12/11/2011    Procedure: COLONOSCOPY;  Surgeon: Missy Sabins, MD;  Location: [place];  Service: Endoscopy;  Laterality: N/A;  . Abdominal hysterectomy  2002  . Cholecystectomy  ~ 2002  . Tonsillectomy and adenoidectomy  1988  . Appendectomy  ~ 2002    "w/hysterectomy"  . Colonoscopy  04/26/2012    Procedure: COLONOSCOPY;  Surgeon: Wonda Horner, MD;  Location: [facility] ENDOSCOPY;  Service: Endoscopy;  Laterality: N/A;  apc  . Hernia repair      Umbilical  . Open reduction internal fixation (orif) distal radial fracture Left 01/07/2015    Procedure: OPEN REDUCTION INTERNAL FIXATION (ORIF) LEFT DISTAL RADIAL FRACTURE;  Surgeon: Milly Jakob, MD;  Location: [place];  Service: Orthopedics;  Laterality: Left;  . Knee arthroscopy with medial menisectomy Right 03/29/2015    Procedure: ARTHROSCOPY KNEE, medial menisectomy, removal of loose body;  Surgeon: Frederik Pear, MD;  Location: [place];  Service: Orthopedics;  Laterality: Right;  RIGHT KNEE ARTHROSCOPY   Social History:  reports that she quit smoking about 18 years ago. Her smoking use included Cigarettes. She has a .72 pack-year smoking history. She has never used smokeless tobacco. She reports that she does not drink alcohol or  use illicit drugs. Where does patient live nursing home. Can patient participate in ADLs? Not sure.  Allergies  Allergen Reactions  . Aspirin Other (See Comments)    Bleeding events with 325mg  dose, unsure about 81mg ?  . Codeine Hypertension  . Penicillins Anaphylaxis  . Reglan [Metoclopramide] Swelling and Rash    5MG  MAKES THROAT SWELL  . Xifaxan [Rifaximin] Hives, Shortness Of Breath and Swelling     550MG  MAKES THROAT SWELL and SHORTNESS OF BREATH  . Zocor [Simvastatin] Anaphylaxis and Hives  . Crestor [Rosuvastatin] Other (See Comments)    Cramping and Muscles ache    Family History:  Family History  Problem Relation Age of Onset  . Colon cancer Sister   . Alzheimer's disease Mother   . Heart disease Father     Enlarged heart      Prior to Admission medications   Medication Sig Start Date End Date Taking? Authorizing Provider  acetaminophen (TYLENOL) 500 MG tablet Take 1,000 mg by mouth daily as needed for fever or headache (headache).     Historical Provider, MD  aluminum-magnesium hydroxide-simethicone (MAALOX) [id number] MG/5ML SUSP Take 30 mLs by mouth every 4 (four) hours as needed.    Historical Provider, MD  amLODipine (NORVASC) 2.5 MG tablet Take 5 mg by mouth daily.     Historical Provider, MD  aspirin EC 81 MG tablet Take 1 tablet (81 mg total) by mouth daily. 10/29/15   Dennie Bible, NP  clopidogrel (PLAVIX) 75 MG tablet Take 75 mg by mouth daily.    Historical Provider, MD  Dextromethorphan-Quinidine (NUEDEXTA) 20-10 MG CAPS Take by mouth 2 (two) times daily.    Historical Provider, MD  fluticasone (FLONASE) 50 MCG/ACT nasal spray Place 2 sprays into both nostrils daily as needed for allergies.  11/12/14   Historical Provider, MD  gabapentin (NEURONTIN) 300 MG capsule Take 2 capsules (600 mg total) by mouth 3 (three) times daily. 04/17/15   Wallene Huh, DPM  hydrALAZINE (APRESOLINE) 50 MG tablet Take 1 tablet (50 mg total) by mouth every 8 (eight) hours. 08/07/15   Donzetta Starch, NP  hydroxypropyl cellulose (LACRISERT) 5 MG INST Place 5 mg into both eyes 3 (three) times daily. 08/07/15   Donzetta Starch, NP  insulin detemir (LEVEMIR) 100 UNIT/ML injection Inject 0.4 mLs (40 Units total) into the skin daily. Patient taking differently: Inject 40 Units into the skin every evening.  08/07/15   Donzetta Starch, NP  labetalol (NORMODYNE) 100 MG tablet Take 1 tablet (100 mg  total) by mouth 2 (two) times daily. 08/10/15   Thurnell Lose, MD  lisinopril (PRINIVIL,ZESTRIL) 10 MG tablet Take 1 tablet (10 mg total) by mouth 2 (two) times daily. Patient taking differently: Take 20 mg by mouth 2 (two) times daily.  08/10/15   Thurnell Lose, MD  LORazepam (ATIVAN) 0.5 MG tablet Take 1 tablet (0.5 mg total) by mouth 4 (four) times daily. FOR ANXIETY 08/07/15   Donzetta Starch, NP  losartan (COZAAR) 100 MG tablet Take 100 mg by mouth daily.    Historical Provider, MD  meclizine (ANTIVERT) 12.5 MG tablet Take 12.5 mg by mouth 3 (three) times daily as needed for dizziness.    Historical Provider, MD  metFORMIN (GLUCOPHAGE-XR) 500 MG 24 hr tablet Take 500 mg by mouth daily with breakfast.    Historical Provider, MD  nitrofurantoin (MACRODANTIN) 100 MG capsule Take 100 mg by mouth 2 (two) times daily.    Historical Provider, MD  ondansetron (ZOFRAN-ODT) 4 MG disintegrating tablet Take 1 tablet (4 mg total) by mouth every 8 (eight) hours as needed for nausea or vomiting. 08/16/15   Davonna Belling, MD  polyvinyl alcohol (LIQUIFILM TEARS) 1.4 % ophthalmic solution Place 1 drop into both eyes 4 (four) times daily. Reported on 10/18/2015    Historical Provider, MD  pravastatin (PRAVACHOL) 80 MG tablet Take 1 tablet (80 mg total) by mouth daily. Patient taking differently: Take 80 mg by mouth at bedtime.  08/07/15 08/06/16  Donzetta Starch, NP  PROAIR HFA 108 (90 BASE) MCG/ACT inhaler USE 2 PUFFS EVERY 6 HOURS AS NEEDED FOR WHEEZING    Clinton D Young, MD  senna-docusate (SENOKOT-S) 8.6-50 MG tablet Take 1 tablet by mouth 2 (two) times daily. 08/07/15   Donzetta Starch, NP  tiZANidine (ZANAFLEX) 4 MG capsule Take 4 mg by mouth 4 (four) times daily as needed for muscle spasms.    Historical Provider, MD  traMADol (ULTRAM) 50 MG tablet Take 50 mg by mouth 3 (three) times daily.    Historical Provider, MD  venlafaxine XR (EFFEXOR-XR) 150 MG 24 hr capsule Take 150 mg by mouth daily with breakfast.     Historical Provider, MD    Physical Exam: Filed Vitals:   11/01/15 0315 11/01/15 0316 11/01/15 0330 11/01/15 0400  BP: 112/72 112/72 125/78   Pulse: 86 87 91   Temp:      TempSrc:      Resp:  18 11   Height:    5\' 2"  (1.575 m)  Weight:    77.2 kg (170 lb 3.1 oz)  SpO2: 98% 97% 99%      General:  Moderately built and nourished.  Eyes: Anicteric no pallor.  ENT: No discharge from the ears eyes nose or mouth.  Neck: No mass felt. No neck rigidity.  Cardiovascular: S1 and S2 heard.  Respiratory: No rhonchi or crepitations.  Abdomen: Soft nontender bowel sounds present. No guarding or rigidity.  Skin: No rash.  Musculoskeletal: No edema.  Psychiatric: Patient appears lethargic.  Neurologic: Patient appears lethargic.  Labs on Admission:  Basic Metabolic Panel:  Recent Labs Lab 10/31/15 2228 10/31/15 2236  NA 141 141  K 3.4* 3.5  CL 99* 100*  CO2  --  29  GLUCOSE 163* 167*  BUN 14 11  CREATININE 0.70 0.79  CALCIUM  --  10.0  MG  --  2.0   Liver Function Tests:  Recent Labs Lab 10/31/15 2236  AST 23  ALT 17  ALKPHOS 64  BILITOT 0.1*  PROT 6.9  ALBUMIN 3.6   No results for input(s): LIPASE, AMYLASE in the last 168 hours.  Recent Labs Lab 10/31/15 2236  AMMONIA 35   CBC:  Recent Labs Lab 10/31/15 2212 10/31/15 2228  WBC 9.7  --   HGB 13.7 15.0  HCT 42.6 44.0  MCV 87.3  --   PLT 163  --    Cardiac Enzymes: No results for input(s): CKTOTAL, CKMB, CKMBINDEX, TROPONINI in the last 168 hours.  BNP (last 3 results) No results for input(s): BNP in the last 8760 hours.  ProBNP (last 3 results) No results for input(s): PROBNP in the last 8760 hours.  CBG: No results for input(s): GLUCAP in the last 168 hours.  Radiological Exams on Admission: Dg Chest 2 View  10/31/2015  CLINICAL DATA:  Chest pain earlier today.  Initial encounter. EXAM: CHEST  2 VIEW COMPARISON:  Single view of the chest 08/16/2015 PA and lateral  chest 10/01/2014.  FINDINGS: The lungs are clear. Asymmetric elevation of the left hemidiaphragm relative to the right is unchanged. Heart size is normal. No pneumothorax or pleural effusion. IMPRESSION: No acute disease. Electronically Signed   By: Inge Rise M.D.   On: 10/31/2015 22:45   Ct Head Wo Contrast  11/01/2015  CLINICAL DATA:  Acute onset of altered mental status. Loss of consciousness. Initial encounter. EXAM: CT HEAD WITHOUT CONTRAST TECHNIQUE: Contiguous axial images were obtained from the base of the skull through the vertex without intravenous contrast. COMPARISON:  CT of the head performed 10/25/2015 FINDINGS: There is no evidence of acute infarction, mass lesion, or intra- or extra-axial hemorrhage on CT. A chronic infarct is again noted at the right thalamus, with a chronic lacunar infarct at the left basal ganglia. Scattered periventricular white matter change likely reflects small vessel ischemic microangiopathy. The brainstem and fourth ventricle are within normal limits. The cerebral hemispheres demonstrate grossly normal gray-white differentiation. No mass effect or midline shift is seen. There is no evidence of fracture; visualized osseous structures are unremarkable in appearance. The visualized portions of the orbits are within normal limits. The paranasal sinuses and mastoid air cells are well-aerated. No significant soft tissue abnormalities are seen. IMPRESSION: 1. No acute intracranial pathology seen on CT. 2. Chronic infarct again noted at the right thalamus, and chronic lacunar infarct at the left basal ganglia. 3. Scattered small vessel ischemic microangiopathy. Electronically Signed   By: Garald Balding M.D.   On: 11/01/2015 03:11   Ct Angio Chest Pe W/cm &/or Wo Cm  11/01/2015  CLINICAL DATA:  Altered mental status and chest pain. Shortness of breath. Diabetic. EXAM: CT ANGIOGRAPHY CHEST WITH CONTRAST TECHNIQUE: Multidetector CT imaging of the chest was performed using the standard  protocol during bolus administration of intravenous contrast. Multiplanar CT image reconstructions and MIPs were obtained to evaluate the vascular anatomy. CONTRAST:  171mL OMNIPAQUE IOHEXOL 300 MG/ML  SOLN COMPARISON:  None. FINDINGS: Technically adequate study with good opacification of the central and segmental pulmonary arteries. No focal filling defects demonstrated. No evidence of significant pulmonary embolus. Normal heart size. Calcification in the mitral valve annulus. Coronary artery calcifications. Normal caliber thoracic aorta with mild calcification. No aortic aneurysm or dissection. Great vessel origins are patent. Mediastinal lymph nodes are not pathologically enlarged. Esophagus is decompressed. Atelectasis in the lung bases. Mosaic attenuation pattern to the lungs may be due to respiratory motion or air trapping. No focal consolidation. Airways appear patent although there is some flattening of the central airways which could indicate tracheomalacia. No pleural effusions. No pneumothorax. Included portions of the upper abdominal organs demonstrate a small accessory spleen. Degenerative changes in the spine. No destructive bone lesions. Review of the MIP images confirms the above findings. IMPRESSION: No evidence of significant pulmonary embolus. Atelectasis in the lungs. Mosaic attenuation pattern may indicate motion artifact or air trapping. Electronically Signed   By: Lucienne Capers M.D.   On: 11/01/2015 03:26    EKG: Independently reviewed. Normal sinus rhythm with poor R-wave progression.  Assessment/Plan Active Problems:   Essential hypertension   OSA on CPAP   Cough   Acute encephalopathy   Type 2 diabetes mellitus with vascular disease (HCC)   Chest pain   Pain in the chest   1. Chest pain - since patient is lethargic at this time we are not able to get further history about the character and the timing of chest pain. Given the patient's history of diabetes and  hypertension  and stroke we will cycle cardiac markers. Will check 2-D echo. Patient is on aspirin and Plavix. 2. Acute encephalopathy - cause not clear. Patient was admitted for similar picture in November when cause was found to be UTI. Patient afebrile at this time. CT head is unremarkable. Urine shows too numerous to count white blood cells with moderate leukocyte esterase. I have placed patient on doxycycline for urine cultures. For now I'm holding off patient's Neurontin and Ativan will be on when necessary dosing instead of scheduled dosing. Recheck ABGs in a.m. Again. 3. Diabetes mellitus type 2 - will continue Lantus insulin with sliding scale coverage. Hold metformin while inpatient. 4. Hypertension - continue home medications. When necessary IV hydralazine for systolic blood pressure more than 140. 5. History of hemorrhagic CVA with left-sided hemiplegia - posterior monitor blood pressure and continue antiplatelet agents. 6. OSA on C Pap.   DVT Prophylaxis Lovenox.  Code Status: Full code.  Family Communication: No family at the bedside.  Disposition Plan: Admit for observation.    [place] Ebron N. [facility] Pager [phone].  If 7PM-7AM, please contact night-coverage [email or web address] Password [email or web address] 11/01/2015, 4:46 AM

## 2015-11-01 NOTE — Progress Notes (Signed)
abg collected  

## 2015-11-01 NOTE — Progress Notes (Signed)
  Echocardiogram 2D Echocardiogram has been performed.  Destiny Robinson 11/01/2015, 9:54 AM

## 2015-11-01 NOTE — Progress Notes (Addendum)
[facility] Progress Note   LACRICIA TOLLISON  [id number]  DOB: 02-17-1956  DOA: 10/31/2015 PCP: [name], MD  Brief narrative: Destiny Robinson is a 60 y.o. 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 She was having chest pain while getting PT. She states that every time PT works with her left arm, she feels chest pain . She also states that every time she becomes anxious, she has chest pain. She received a Nitroglycerin for her pain and then passed out. She was quite confused in the ER and hypoxic. She was found to have a UTI.    Subjective: Feels sleepy. No further chest pain through the night. No dysuria, fever or chills. No dyspnea or cough.   Assessment/Plan: Principal Problem:   Syncope/chest pain at rest - Based on history, this appears to have been likely secondary to nitroglycerin-CT head did not reveal new infarcts-she has no new focal neurological symptoms suggestive of an infarct before I don't feel an MRI is needed at this time  -Echo performed today reveals grade 1 diastolic dysfunction, moderately dilated left atrium and mildly increased pulmonary pressure but no other abnormalities that could have caused syncope -Follow on telemetry for arrhythmias-she has no complaints of palpitations -Troponin have been negative and EKG was unrevealing -Suspect chest pain may be related to anxiety versus radiating from left arm during physical therapy  Active Problems: -UTI - Follow-up culture-Started on doxycycline as her previous UA grew out staph that was sensitive to this   Essential hypertension  Hypoxia on admission -Chest x-ray and CT without an underlying etiology for hypoxia other than atelectasis -She has no cough -Start incentive spirometry-in-U O2 as needed    OSA on CPAP -Continue C Pap at bedtime    Type 2 diabetes mellitus with vascular disease ([place]) -Continue  insulin  Hypertension -Cozaar , hydralazine and Norvasc  CVA in fall of 2016 -Has left-sided weakness and is mostly nonambulatory -Continue pravastatin,Plavix and baby aspirin  Thrombocytopenia - repeat tomorrow  Antibiotics: Anti-infectives    Start     Dose/Rate Route Frequency Ordered Stop   11/01/15 1000  doxycycline (VIBRA-TABS) tablet 100 mg  Status:  Discontinued     100 mg Oral Every 12 hours 11/01/15 0758 11/01/15 0810   11/01/15 1000  doxycycline (VIBRA-TABS) tablet 100 mg     100 mg Oral Every 12 hours 11/01/15 0810     11/01/15 0815  ciprofloxacin (CIPRO) tablet 500 mg  Status:  Discontinued     500 mg Oral 2 times daily 11/01/15 0810 11/01/15 0810   11/01/15 0600  doxycycline (VIBRAMYCIN) 100 mg in dextrose 5 % 250 mL IVPB  Status:  Discontinued     100 mg 125 mL/hr over 120 Minutes Intravenous Every 12 hours 11/01/15 0527 11/01/15 0758     Code Status:     Code Status Orders        Start     Ordered   11/01/15 0445  Full code   Continuous     11/01/15 0446    Code Status History    Date Active Date Inactive Code Status Order ID Comments User Context   08/08/2015  2:50 PM 08/10/2015  8:11 PM Full Code [id number]  Rise Patience, MD Inpatient   07/29/2015 11:10 AM 08/07/2015  9:21 PM Full Code [id number]  Alexis Goodell, MD Inpatient   10/01/2014  3:42 PM 10/03/2014  7:04 PM Full Code  [id number]  Donne Hazel, MD ED   12/21/2011  4:48 PM 12/24/2011  5:38 PM Full Code [id number]  Acquanetta Chain, DO Inpatient   12/10/2011  4:13 AM 12/13/2011  5:44 PM Full Code [id number]  Rise Patience, MD Inpatient    Advance Directive Documentation        Most Recent Value   Type of Advance Directive  Healthcare Power of Attorney   Pre-existing out of facility DNR order (yellow form or pink MOST form)     "MOST" Form in Place?       Family Communication:  Disposition Plan: Discharged back to nursing facility in 1-2 days  DVT prophylaxis: Lovenox  Consultants:  None  Procedures: 2-D echo   Objective: Filed Weights   10/31/15 2126 11/01/15 0400  Weight: 78.472 kg (173 lb) 77.2 kg (170 lb 3.1 oz)    Intake/Output Summary (Last 24 hours) at 11/01/15 1249 Last data filed at 11/01/15 0800  Gross per 24 hour  Intake    225 ml  Output      0 ml  Net    225 ml     Vitals Filed Vitals:   11/01/15 1000 11/01/15 1100 11/01/15 1203 11/01/15 1204  BP: 127/77 135/82  139/82  Pulse: 99 91 85   Temp:  97.4 F (36.3 C)    TempSrc:  Oral    Resp: 16 17    Height:      Weight:      SpO2: 95% 98%      Exam:  General:  Pt is alert, not in acute distress  HEENT: No icterus, No thrush, oral mucosa moist  Cardiovascular: regular rate and rhythm, S1/S2 No murmur  Respiratory: clear to auscultation bilaterally   Abdomen: Soft, +Bowel sounds, non tender, non distended, no guarding  MSK: No cyanosis or clubbing- no pedal edema  Neuro: 4/5 strength on left side, 5/5 in right side, CN 2-12 intact   Data Reviewed: Basic Metabolic Panel:  Recent Labs Lab 10/31/15 2228 10/31/15 2236 11/01/15 0722  NA 141 141 142  K 3.4* 3.5 3.7  CL 99* 100* 105  CO2  --  29 30  GLUCOSE 163* 167* 129*  BUN 14 11 10   CREATININE 0.70 0.79 0.51  CALCIUM  --  10.0 9.5  MG  --  2.0  --    Liver Function Tests:  Recent Labs Lab 10/31/15 2236 11/01/15 0722  AST 23 21  ALT 17 16  ALKPHOS 64 63  BILITOT 0.1* 0.2*  PROT 6.9 6.8  ALBUMIN 3.6 3.4*   No results for input(s): LIPASE, AMYLASE in the last 168 hours.  Recent Labs Lab 10/31/15 2236  AMMONIA 35   CBC:  Recent Labs Lab 10/31/15 2212 10/31/15 2228 11/01/15 0722  WBC 9.7  --  5.8  NEUTROABS  --   --  3.7  HGB 13.7 15.0 13.1  HCT 42.6 44.0 41.7  MCV 87.3  --  88.9  PLT 163  --  147*   Cardiac Enzymes:  Recent Labs Lab 11/01/15 0722 11/01/15 1019  TROPONINI <0.03 <0.03   BNP (last 3 results) No results for input(s): BNP in the last 8760 hours.  ProBNP (last 3  results) No results for input(s): PROBNP in the last 8760 hours.  CBG:  Recent Labs Lab 11/01/15 0739  GLUCAP 121*    Recent Results (from the past 240 hour(s))  MRSA PCR Screening     Status: None   Collection Time: 11/01/15  4:24 AM  Result Value Ref Range Status   MRSA by PCR NEGATIVE NEGATIVE Final    Comment:        The GeneXpert MRSA Assay (FDA approved for NASAL specimens only), is one component of a comprehensive MRSA colonization surveillance program. It is not intended to diagnose MRSA infection nor to guide or monitor treatment for MRSA infections.      Studies: Dg Chest 2 View  10/31/2015  CLINICAL DATA:  Chest pain earlier today.  Initial encounter. EXAM: CHEST  2 VIEW COMPARISON:  Single view of the chest 08/16/2015 PA and lateral chest 10/01/2014. FINDINGS: The lungs are clear. Asymmetric elevation of the left hemidiaphragm relative to the right is unchanged. Heart size is normal. No pneumothorax or pleural effusion. IMPRESSION: No acute disease. Electronically Signed   By: Inge Rise M.D.   On: 10/31/2015 22:45   Ct Head Wo Contrast  11/01/2015  CLINICAL DATA:  Acute onset of altered mental status. Loss of consciousness. Initial encounter. EXAM: CT HEAD WITHOUT CONTRAST TECHNIQUE: Contiguous axial images were obtained from the base of the skull through the vertex without intravenous contrast. COMPARISON:  CT of the head performed 10/25/2015 FINDINGS: There is no evidence of acute infarction, mass lesion, or intra- or extra-axial hemorrhage on CT. A chronic infarct is again noted at the right thalamus, with a chronic lacunar infarct at the left basal ganglia. Scattered periventricular white matter change likely reflects small vessel ischemic microangiopathy. The brainstem and fourth ventricle are within normal limits. The cerebral hemispheres demonstrate grossly normal gray-white differentiation. No mass effect or midline shift is seen. There is no evidence of  fracture; visualized osseous structures are unremarkable in appearance. The visualized portions of the orbits are within normal limits. The paranasal sinuses and mastoid air cells are well-aerated. No significant soft tissue abnormalities are seen. IMPRESSION: 1. No acute intracranial pathology seen on CT. 2. Chronic infarct again noted at the right thalamus, and chronic lacunar infarct at the left basal ganglia. 3. Scattered small vessel ischemic microangiopathy. Electronically Signed   By: Garald Balding M.D.   On: 11/01/2015 03:11   Ct Angio Chest Pe W/cm &/or Wo Cm  11/01/2015  CLINICAL DATA:  Altered mental status and chest pain. Shortness of breath. Diabetic. EXAM: CT ANGIOGRAPHY CHEST WITH CONTRAST TECHNIQUE: Multidetector CT imaging of the chest was performed using the standard protocol during bolus administration of intravenous contrast. Multiplanar CT image reconstructions and MIPs were obtained to evaluate the vascular anatomy. CONTRAST:  134mL OMNIPAQUE IOHEXOL 300 MG/ML  SOLN COMPARISON:  None. FINDINGS: Technically adequate study with good opacification of the central and segmental pulmonary arteries. No focal filling defects demonstrated. No evidence of significant pulmonary embolus. Normal heart size. Calcification in the mitral valve annulus. Coronary artery calcifications. Normal caliber thoracic aorta with mild calcification. No aortic aneurysm or dissection. Great vessel origins are patent. Mediastinal lymph nodes are not pathologically enlarged. Esophagus is decompressed. Atelectasis in the lung bases. Mosaic attenuation pattern to the lungs may be due to respiratory motion or air trapping. No focal consolidation. Airways appear patent although there is some flattening of the central airways which could indicate tracheomalacia. No pleural effusions. No pneumothorax. Included portions of the upper abdominal organs demonstrate a small accessory spleen. Degenerative changes in the spine. No  destructive bone lesions. Review of the MIP images confirms the above findings. IMPRESSION: No evidence of significant pulmonary embolus. Atelectasis in the lungs. Mosaic attenuation pattern may indicate motion artifact or air trapping. Electronically  Signed   By: Lucienne Capers M.D.   On: 11/01/2015 03:26    Scheduled Meds:  Scheduled Meds: . amLODipine  5 mg Oral Daily  . antiseptic oral rinse  7 mL Mouth Rinse q12n4p  . aspirin EC  81 mg Oral Daily  . chlorhexidine  15 mL Mouth Rinse BID  . clopidogrel  75 mg Oral Daily  . doxycycline  100 mg Oral Q12H  . enoxaparin (LOVENOX) injection  40 mg Subcutaneous Q24H  . hydrALAZINE  50 mg Oral 3 times per day  . insulin aspart  0-9 Units Subcutaneous TID WC  . insulin detemir  40 Units Subcutaneous QPM  . labetalol  100 mg Oral BID  . lisinopril  20 mg Oral BID  . losartan  100 mg Oral Daily  . pravastatin  80 mg Oral QHS  . senna-docusate  1 tablet Oral BID  . venlafaxine XR  150 mg Oral Q breakfast   Continuous Infusions:   Time spent on care of this patient: 68 min   [place], MD 11/01/2015, 12:49 PM    [facility] Office  [phone] Pager - Text Page per [email or web address] If 7PM-7AM, please contact night-coverage [email or web address]

## 2015-11-01 NOTE — ED Notes (Signed)
Destiny Robinson [facility]) [phone] (cell)                                             [phone] (home)

## 2015-11-01 NOTE — Progress Notes (Signed)
Pt is on NIV at this time 

## 2015-11-01 NOTE — Progress Notes (Signed)
Pt collected on a 2liter [place].

## 2015-11-01 NOTE — Progress Notes (Signed)
abg results given to MD. MD wants placed on CPAP for OSA

## 2015-11-02 DIAGNOSIS — N390 Urinary tract infection, site not specified: Secondary | ICD-10-CM

## 2015-11-02 DIAGNOSIS — R079 Chest pain, unspecified: Secondary | ICD-10-CM | POA: Diagnosis not present

## 2015-11-02 DIAGNOSIS — I10 Essential (primary) hypertension: Secondary | ICD-10-CM | POA: Diagnosis not present

## 2015-11-02 DIAGNOSIS — R55 Syncope and collapse: Secondary | ICD-10-CM | POA: Diagnosis not present

## 2015-11-02 DIAGNOSIS — E1159 Type 2 diabetes mellitus with other circulatory complications: Secondary | ICD-10-CM

## 2015-11-02 DIAGNOSIS — G4733 Obstructive sleep apnea (adult) (pediatric): Secondary | ICD-10-CM

## 2015-11-02 DIAGNOSIS — G9340 Encephalopathy, unspecified: Secondary | ICD-10-CM | POA: Diagnosis not present

## 2015-11-02 LAB — CBC
HEMATOCRIT: 44.7 % (ref 36.0–46.0)
HEMOGLOBIN: 14.4 g/dL (ref 12.0–15.0)
MCH: 28.2 pg (ref 26.0–34.0)
MCHC: 32.2 g/dL (ref 30.0–36.0)
MCV: 87.5 fL (ref 78.0–100.0)
Platelets: 152 10*3/uL (ref 150–400)
RBC: 5.11 MIL/uL (ref 3.87–5.11)
RDW: 13.7 % (ref 11.5–15.5)
WBC: 5.9 10*3/uL (ref 4.0–10.5)

## 2015-11-02 LAB — GLUCOSE, CAPILLARY
Glucose-Capillary: 118 mg/dL — ABNORMAL HIGH (ref 65–99)
Glucose-Capillary: 124 mg/dL — ABNORMAL HIGH (ref 65–99)

## 2015-11-02 MED ORDER — FOSFOMYCIN TROMETHAMINE 3 G PO PACK
3.0000 g | PACK | Freq: Once | ORAL | Status: AC
  Administered 2015-11-02: 3 g via ORAL
  Filled 2015-11-02: qty 3

## 2015-11-02 MED ORDER — DOXYCYCLINE HYCLATE 100 MG PO TABS: 100.0000 mg | ORAL_TABLET | Freq: Two times a day (BID) | ORAL | Status: DC

## 2015-11-02 MED ORDER — SENNOSIDES-DOCUSATE SODIUM 8.6-50 MG PO TABS: 1.0000 | ORAL_TABLET | Freq: Two times a day (BID) | ORAL | Status: DC | PRN

## 2015-11-02 NOTE — Progress Notes (Addendum)
Patient is medically stable for DC per RN and MD with phone call to [place]. LCSW updated FL2 and contacted SNF for patient return and they are agreeable.    LCSW sent all DC information through [place] to facility for review.  Patient will require PTAR for transport.   Family aware of DC and agreeable.  No other needs. DC to SNF: [facility].  Lane Hacker, MSW Clinical Social Work: Emergency Room [phone]

## 2015-11-02 NOTE — Evaluation (Signed)
Physical Therapy Evaluation Patient Details Name: Destiny Robinson MRN: [id number] DOB: [birth year] Today's Date: 11/02/2015   History of Present Illness  Pt is a 60 y/o F with history of hemorrhagic CVA last October 2016 with left-sided hemiplegia, hypertension, OSA, diabetes mellitus type 2 was brought to the ER from nursing home after patient was complaining of chest pain w/ syncopal episode after given nitroglycerin.    Clinical Impression  Pt admitted with above diagnosis. Pt currently with functional limitations due to the deficits listed below (see PT Problem List). Mr. Ewers is from SNF where a lift is used to assist her w/ transfer to/from [facility].  She currently requires max>total assist w/ mobility and will need to go SNF at d/c. Pt will benefit from skilled PT to increase their independence and safety with mobility to allow discharge to the venue listed below.      Follow Up Recommendations SNF;Supervision/Assistance - 24 hour    Equipment Recommendations  Other (comment) (TBD at next venue of care)    Recommendations for Other Services       Precautions / Restrictions Precautions Precautions: Fall Precaution Comments: Lt side hemiplegia Required Braces or Orthoses: Other Brace/Splint Other Brace/Splint: Lt wrist splint in room that pt reports she wears 12pm-6pm each day.  Pt refuses to wear it and begins crying and yelling when PT attempts to don brace. Restrictions Weight Bearing Restrictions: No      Mobility  Bed Mobility Overal bed mobility: Needs Assistance;+2 for physical assistance Bed Mobility: Rolling;Sidelying to Sit Rolling: Min assist Sidelying to sit: Total assist;+2 for physical assistance       General bed mobility comments: Pt assist w/ turning by pulling on bed rail.  Total assist to boost up to sitting from sidelying as pt resists due to reported pain in Lt UE/LE.  Cues for relaxation techniques.    Transfers Overall transfer level: Needs  assistance Equipment used: 2 person hand held assist Transfers: Sit to/from [facility] Sit to Stand: Total assist;+2 physical assistance Stand pivot transfers: Total assist;+2 physical assistance       General transfer comment: Total assist to boost up to standing and to pivot to chair, pt becomes very agitated once standing when nurse tech attempts to assist and begins yelling.  Gait belt used for stability.  Total assist to control descent to recliner chair.  Ambulation/Gait                Stairs            Wheelchair Mobility    Modified Rankin (Stroke Patients Only)       Balance Overall balance assessment: Needs assistance Sitting-balance support: Single extremity supported;Feet supported Sitting balance-Leahy Scale: Zero Sitting balance - Comments: Requires max assist as pt becomes agitated and is unable to maintain upright w/ support of only Rt UE   Standing balance support: Bilateral upper extremity supported;During functional activity Standing balance-Leahy Scale: Zero                               Pertinent Vitals/Pain Pain Assessment: Faces Faces Pain Scale: Hurts whole lot Pain Location: Lt LE, Lt UE ("I can't move it but it's painful") Pain Descriptors / Indicators: Grimacing;Guarding;Moaning Pain Intervention(s): Limited activity within patient's tolerance;Monitored during session;Repositioned    Home Living Family/patient expects to be discharged to:: Skilled nursing facility  Additional Comments: Pt is from SNF ([facility])    Prior Function Level of Independence: Needs assistance   Gait / Transfers Assistance Needed: Use of lift at SNF to transfer pt to [facility].  Pt reports she is able to manage WC using Rt UE/Rt LE, otherwise needs assist  ADL's / Homemaking Assistance Needed: Needs assist w/ bathing, pt reports she was able to dress Ind but pt poor historian.  Pt able to feed  herself        Hand Dominance   Dominant Hand: Right    Extremity/Trunk Assessment   Upper Extremity Assessment: LUE deficits/detail       LUE Deficits / Details: Lt hemiplegia due to h/o stroke, no movement noted   Lower Extremity Assessment: LLE deficits/detail;Generalized weakness   LLE Deficits / Details: Lt hemiplegia due to h/o stroke, no movement noted  Cervical / Trunk Assessment: Kyphotic  Communication   Communication: No difficulties  Cognition Arousal/Alertness: Awake/alert Behavior During Therapy: Agitated;Restless;Impulsive Overall Cognitive Status: History of cognitive impairments - at baseline                      General Comments General comments (skin integrity, edema, etc.): Educated pt on importance of wearing Lt UE splint for her regular 6 hrs/day and that the nursing staff will assist her w/ donning it at 12pm.      Exercises        Assessment/Plan    PT Assessment Patient needs continued PT services  PT Diagnosis Difficulty walking;Generalized weakness;Acute pain;Altered mental status   PT Problem List Decreased range of motion;Decreased strength;Decreased activity tolerance;Decreased balance;Decreased mobility;Decreased coordination;Decreased cognition;Decreased knowledge of use of DME;Decreased safety awareness;Decreased knowledge of precautions;Pain;Cardiopulmonary status limiting activity  PT Treatment Interventions DME instruction;Functional mobility training;Therapeutic activities;Therapeutic exercise;Balance training;Neuromuscular re-education;Cognitive remediation;Patient/family education;Modalities;Wheelchair mobility training   PT Goals (Current goals can be found in the Care Plan section) Acute Rehab PT Goals Patient Stated Goal: to go home PT Goal Formulation: With patient Time For Goal Achievement: 11/23/15 Potential to Achieve Goals: Fair    Frequency Min 2X/week   Barriers to discharge        Co-evaluation                End of Session Equipment Utilized During Treatment: Gait belt;Oxygen Activity Tolerance: Treatment limited secondary to agitation;Patient limited by pain Patient left: in chair;with call bell/phone within reach;with chair alarm set;with nursing/sitter in room Nurse Communication: Mobility status;Need for lift equipment    Functional Assessment Tool Used: Clinical Judgement Functional Limitation: Mobility: Walking and moving around Mobility: Walking and Moving Around Current Status [phone]): At least 60 percent but less than 80 percent impaired, limited or restricted Mobility: Walking and Moving Around Goal Status [phone]): At least 60 percent but less than 80 percent impaired, limited or restricted    Time: 1031-1102 PT Time Calculation (min) (ACUTE ONLY): 31 min   Charges:   PT Evaluation $PT Eval High Complexity: 1 Procedure PT Treatments $Therapeutic Activity: 8-22 mins   PT G Codes:   PT G-Codes **NOT FOR INPATIENT CLASS** Functional Assessment Tool Used: Clinical Judgement Functional Limitation: Mobility: Walking and moving around Mobility: Walking and Moving Around Current Status [id number]): At least 60 percent but less than 80 percent impaired, limited or restricted Mobility: Walking and Moving Around Goal Status [phone]): At least 60 percent but less than 80 percent impaired, limited or restricted    Joslyn Hy PT, DPT [phone] Pager: [phone] 11/02/2015, 1:14 PM

## 2015-11-02 NOTE — Discharge Summary (Signed)
Physician Discharge Summary  Destiny Robinson [id number] DOB: 07/08/1956 DOA: 10/31/2015  PCP: [name], MD  Admit date: 10/31/2015 Discharge date: 11/02/2015  Time spent: 50 minutes  Recommendations for Outpatient Follow-up:  1. Return to SNF 2. Avoid Nitroglycerin  Discharge Condition: stable    Discharge Diagnoses:  Principal Problem:   Syncope Active Problems:   Essential hypertension   OSA on CPAP   Acute encephalopathy   Type 2 diabetes mellitus with vascular disease (HCC)   Chest pain   UTI (urinary tract infection)   History of present illness:  Destiny Robinson is a 60 y.o. 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 She was having chest pain while getting PT. She states that every time PT works with her left arm, she feels chest pain . She also states that every time she becomes anxious, she has chest pain. She received a Nitroglycerin for her pain and then passed out. She was quite confused in the ER and hypoxic. She was found to have a UTI.   Hospital Course:  Principal Problem:  Syncope/chest pain at rest - Based on history, this appears to have been likely secondary to nitroglycerin-CT head did not reveal new infarcts-she has no new focal neurological symptoms suggestive of an infarct before I don't feel an MRI is needed at this time  - CT chest was inconclusive  -Echo performed today reveals grade 1 diastolic dysfunction, moderately dilated left atrium and mildly increased pulmonary pressure but no other abnormalities that could have caused syncope -Followed on telemetry for arrhythmias: no arrythmias noted; she has no complaints of palpitations -Troponin have been negative and EKG was unrevealing -Suspect chest pain may be related to anxiety versus radiating from left arm during physical therapy - RNs states today the patient is constantly calling for help and does not seem to be able  to be satisfied   Active Problems: Hypoxia on admission -Chest x-ray and CT without an underlying etiology for hypoxia other than atelectasis -She has no cough -Start incentive spirometry-in-U O2 as needed  -UTI - Started on doxycycline as her previous UA grew out staph that was sensitive to this- - culture growing E coli- will give Fosphomycin before she leaves today  Diarrhea - received Senna per outpt dose and had 2 loose BMs today and is angry about receiving the laxative- will d/c for now- use PRN at facilty   OSA on CPAP -Continue C Pap at bedtime   Type 2 diabetes mellitus with vascular disease ([place]) -Continue insulin  Hypertension -Cozaar , hydralazine and Norvasc  CVA in fall of 2016 -Has left-sided weakness and is mostly nonambulatory -Continue pravastatin,Plavix and baby aspirin  Thrombocytopenia - normalized today   Procedures:   Left ventricle: The cavity size was normal. Wall thickness was normal. Systolic function was normal. The estimated ejection fraction was in the range of 60% to 65%. Wall motion was normal; there were no regional wall motion abnormalities. Doppler parameters are consistent with abnormal left ventricular relaxation (grade 1 diastolic dysfunction). - Aortic valve: There was trivial regurgitation. Valve area (VTI): 2.24 cm^2. Valve area (Vmax): 1.99 cm^2. Valve area (Vmean): 2.08 cm^2. - Mitral valve: Calcified annulus. - Left atrium: The atrium was moderately dilated. - Pulmonary arteries: Systolic pressure was mildly increased. PA peak pressure: 44 mm Hg (S).   Consultations:  none  Discharge Exam: Filed Weights   10/31/15 2126 11/01/15 0400 11/02/15 0411  Weight: 78.472 kg (173 lb) 77.2 kg (170 lb 3.1 oz) 76.6 kg (168 lb 14 oz)   Filed Vitals:   11/02/15 0848 11/02/15 0851  BP: 152/87   Pulse:  103  Temp:    Resp:      General: AAO x 3, no distress Cardiovascular: RRR, no murmurs  Respiratory:  clear to auscultation bilaterally GI: soft, non-tender, non-distended, bowel sound positive  Discharge Instructions You were cared for by a hospitalist during your hospital stay. If you have any questions about your discharge medications or the care you received while you were in the hospital after you are discharged, you can call the unit and asked to speak with the hospitalist on call if the hospitalist that took care of you is not available. Once you are discharged, your primary care physician will handle any further medical issues. Please note that NO REFILLS for any discharge medications will be authorized once you are discharged, as it is imperative that you return to your primary care physician (or establish a relationship with a primary care physician if you do not have one) for your aftercare needs so that they can reassess your need for medications and monitor your lab values.      Discharge Instructions    Diet - low sodium heart healthy    Complete by:  As directed      Increase activity slowly    Complete by:  As directed             Medication List    TAKE these medications        acetaminophen 500 MG tablet  Commonly known as:  TYLENOL  Take 1,000 mg by mouth daily as needed for fever or headache (headache).     aluminum-magnesium hydroxide-simethicone [id number] MG/5ML Susp  Commonly known as:  MAALOX  Take 30 mLs by mouth every 4 (four) hours as needed.     amLODipine 5 MG tablet  Commonly known as:  NORVASC  Take 5 mg by mouth daily.     aspirin EC 81 MG tablet  Take 1 tablet (81 mg total) by mouth daily.     clopidogrel 75 MG tablet  Commonly known as:  PLAVIX  Take 75 mg by mouth daily.     doxycycline 100 MG tablet  Commonly known as:  VIBRA-TABS  Take 1 tablet (100 mg total) by mouth every 12 (twelve) hours.     fluticasone 50 MCG/ACT nasal spray  Commonly known as:  FLONASE  Place 2 sprays into both nostrils daily as needed for allergies.      gabapentin 300 MG capsule  Commonly known as:  NEURONTIN  Take 2 capsules (600 mg total) by mouth 3 (three) times daily.     hydrALAZINE 50 MG tablet  Commonly known as:  APRESOLINE  Take 1 tablet (50 mg total) by mouth every 8 (eight) hours.     insulin detemir 100 UNIT/ML injection  Commonly known as:  LEVEMIR  Inject 0.4 mLs (40 Units total) into the skin daily.     labetalol 100 MG tablet  Commonly known as:  NORMODYNE  Take 1 tablet (100 mg total) by mouth 2 (two) times daily.     lisinopril 10 MG tablet  Commonly known as:  PRINIVIL,ZESTRIL  Take 1 tablet (10 mg total) by mouth 2 (two) times daily.     LORazepam 0.5 MG tablet  Commonly known as:  ATIVAN  Take 1 tablet (0.5 mg total) by mouth 4 (  four) times daily. FOR ANXIETY     losartan 100 MG tablet  Commonly known as:  COZAAR  Take 100 mg by mouth daily.     meclizine 12.5 MG tablet  Commonly known as:  ANTIVERT  Take 12.5 mg by mouth 3 (three) times daily as needed for dizziness.     metFORMIN 500 MG 24 hr tablet  Commonly known as:  GLUCOPHAGE-XR  Take 500 mg by mouth daily with breakfast.     NUEDEXTA 20-10 MG Caps  Generic drug:  Dextromethorphan-Quinidine  Take by mouth 2 (two) times daily.     ondansetron 4 MG disintegrating tablet  Commonly known as:  ZOFRAN-ODT  Take 1 tablet (4 mg total) by mouth every 8 (eight) hours as needed for nausea or vomiting.     polyvinyl alcohol 1.4 % ophthalmic solution  Commonly known as:  LIQUIFILM TEARS  Place 1 drop into both eyes 4 (four) times daily. Reported on 11/01/2015     pravastatin 80 MG tablet  Commonly known as:  PRAVACHOL  Take 1 tablet (80 mg total) by mouth daily.     PROAIR HFA 108 (90 Base) MCG/ACT inhaler  Generic drug:  albuterol  USE 2 PUFFS EVERY 6 HOURS AS NEEDED FOR WHEEZING     senna-docusate 8.6-50 MG tablet  Commonly known as:  Senokot-S  Take 1 tablet by mouth 2 (two) times daily as needed for mild constipation.     tiZANidine 4 MG  capsule  Commonly known as:  ZANAFLEX  Take 4 mg by mouth 4 (four) times daily as needed for muscle spasms.     traMADol 50 MG tablet  Commonly known as:  ULTRAM  Take 50 mg by mouth 3 (three) times daily.     venlafaxine XR 150 MG 24 hr capsule  Commonly known as:  EFFEXOR-XR  Take 150 mg by mouth daily with breakfast.       Allergies  Allergen Reactions  . Aspirin Other (See Comments)    Bleeding events with 325mg  dose, unsure about 81mg ?  . Codeine Hypertension  . Penicillins Anaphylaxis    Has patient had a PCN reaction causing immediate rash, facial/tongue/throat swelling, SOB or lightheadedness with hypotension: Yes Has patient had a PCN reaction causing severe rash involving mucus membranes or skin necrosis: No Has patient had a PCN reaction that required hospitalization Yes Has patient had a PCN reaction occurring within the last 10 years: No If all of the above answers are "NO", then may proceed with Cephalosporin use.   . Reglan [Metoclopramide] Swelling and Rash    5MG  MAKES THROAT SWELL  . Xifaxan [Rifaximin] Hives, Shortness Of Breath and Swelling    550MG  MAKES THROAT SWELL and SHORTNESS OF BREATH  . Zocor [Simvastatin] Anaphylaxis and Hives  . Crestor [Rosuvastatin] Other (See Comments)    Cramping and Muscles ache      The results of significant diagnostics from this hospitalization (including imaging, microbiology, ancillary and laboratory) are listed below for reference.    Significant Diagnostic Studies: Dg Chest 2 View  10/31/2015  CLINICAL DATA:  Chest pain earlier today.  Initial encounter. EXAM: CHEST  2 VIEW COMPARISON:  Single view of the chest 08/16/2015 PA and lateral chest 10/01/2014. FINDINGS: The lungs are clear. Asymmetric elevation of the left hemidiaphragm relative to the right is unchanged. Heart size is normal. No pneumothorax or pleural effusion. IMPRESSION: No acute disease. Electronically Signed   By: Inge Rise M.D.   On:  10/31/2015 22:45  Ct Head Wo Contrast  11/01/2015  CLINICAL DATA:  Acute onset of altered mental status. Loss of consciousness. Initial encounter. EXAM: CT HEAD WITHOUT CONTRAST TECHNIQUE: Contiguous axial images were obtained from the base of the skull through the vertex without intravenous contrast. COMPARISON:  CT of the head performed 10/25/2015 FINDINGS: There is no evidence of acute infarction, mass lesion, or intra- or extra-axial hemorrhage on CT. A chronic infarct is again noted at the right thalamus, with a chronic lacunar infarct at the left basal ganglia. Scattered periventricular white matter change likely reflects small vessel ischemic microangiopathy. The brainstem and fourth ventricle are within normal limits. The cerebral hemispheres demonstrate grossly normal gray-white differentiation. No mass effect or midline shift is seen. There is no evidence of fracture; visualized osseous structures are unremarkable in appearance. The visualized portions of the orbits are within normal limits. The paranasal sinuses and mastoid air cells are well-aerated. No significant soft tissue abnormalities are seen. IMPRESSION: 1. No acute intracranial pathology seen on CT. 2. Chronic infarct again noted at the right thalamus, and chronic lacunar infarct at the left basal ganglia. 3. Scattered small vessel ischemic microangiopathy. Electronically Signed   By: Garald Balding M.D.   On: 11/01/2015 03:11   Ct Head Wo Contrast  10/25/2015   [facility] [address] [place], [place] [address] [phone] NEUROIMAGING REPORT STUDY DATE: 10/25/2015 PATIENT NAME: SOPHIANNA FOSNAUGH DOB: 08-02-1956 MRN: [id number] EXAM: CT of the head without contrast ORDERING CLINICIAN: Dennie Bible, NP CLINICAL HISTORY: 60 year old woman with history of right thalamic hemorrhage COMPARISON FILMS: CT scan 08/08/2015 TECHNIQUE: CT scan of the head was obtained utilizing 5 mm axial slices from the skull base  to the vertex. CONTRAST: None IMAGING SITE: [facility],  [place] FINDINGS: There is mild cortical atrophy resulting in mild lateral ventriculomegaly. The right thalamic hemorrhage continues to evolve with reduction in the mass effect and resolution of much of the blood products. .  There is no evidence of new hemorrhage..  No extra-axial fluid collections are seen.  Currently, there is no evidence of mass effect or midline shift.  The cerebellum and brainstem appear normal. There are some hypodense changes in the hemispheres and a chronic lacunar infarction adjacent to the head of the left caudate none of these changes appear to be acute and were present on the prior CT scan.   The orbits and their contents, paranasal sinuses and calvarium are unremarkable.  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   10/25/2015   This CT scan of the head without contrast shows further evolution of the right thalamic hemorrhage from October 2016.    Mass effect is reduced and there is no longer any right to left midline shift compared to the CT scan from 08/08/2015. Blood products have resolved. Additionally, there are unchanged chronic microvascular ischemic changes with a chronic lacunar infarction adjacent to the left caudate. There is basilar artery dolichoectasia calcification. This is unchanged. INTERPRETING PHYSICIAN: Richard A. Felecia Shelling, MD, PhD Certified in  Neuroimaging by [employer]   Ct Angio Chest Pe W/cm &/or Wo Cm  11/01/2015  CLINICAL DATA:  Altered mental status and chest pain. Shortness of breath. Diabetic. EXAM: CT ANGIOGRAPHY CHEST WITH CONTRAST TECHNIQUE: Multidetector CT imaging of the chest was performed using the standard protocol during bolus administration of intravenous contrast. Multiplanar CT image reconstructions and MIPs were obtained to evaluate the vascular anatomy. CONTRAST:  111mL OMNIPAQUE IOHEXOL 300 MG/ML  SOLN COMPARISON:  None. FINDINGS:  Technically adequate study with good opacification of the central and segmental pulmonary arteries. No focal filling defects demonstrated. No evidence of significant pulmonary embolus. Normal heart size. Calcification in the mitral valve annulus. Coronary artery calcifications. Normal caliber thoracic aorta with mild calcification. No aortic aneurysm or dissection. Great vessel origins are patent. Mediastinal lymph nodes are not pathologically enlarged. Esophagus is decompressed. Atelectasis in the lung bases. Mosaic attenuation pattern to the lungs may be due to respiratory motion or air trapping. No focal consolidation. Airways appear patent although there is some flattening of the central airways which could indicate tracheomalacia. No pleural effusions. No pneumothorax. Included portions of the upper abdominal organs demonstrate a small accessory spleen. Degenerative changes in the spine. No destructive bone lesions. Review of the MIP images confirms the above findings. IMPRESSION: No evidence of significant pulmonary embolus. Atelectasis in the lungs. Mosaic attenuation pattern may indicate motion artifact or air trapping. Electronically Signed   By: Lucienne Capers M.D.   On: 11/01/2015 03:26    Microbiology: Recent Results (from the past 240 hour(s))  MRSA PCR Screening     Status: None   Collection Time: 11/01/15  4:24 AM  Result Value Ref Range Status   MRSA by PCR NEGATIVE NEGATIVE Final    Comment:        The GeneXpert MRSA Assay (FDA approved for NASAL specimens only), is one component of a comprehensive MRSA colonization surveillance program. It is not intended to diagnose MRSA infection nor to guide or monitor treatment for MRSA infections.   Culture, Urine     Status: None (Preliminary result)   Collection Time: 11/01/15 10:33 AM  Result Value Ref Range Status   Specimen Description URINE, RANDOM  Final   Special Requests NONE  Final   Culture >=100,000 COLONIES/mL  ESCHERICHIA COLI  Final   Report Status PENDING  Incomplete     Labs: Basic Metabolic Panel:  Recent Labs Lab 10/31/15 2228 10/31/15 2236 11/01/15 0722  NA 141 141 142  K 3.4* 3.5 3.7  CL 99* 100* 105  CO2  --  29 30  GLUCOSE 163* 167* 129*  BUN 14 11 10   CREATININE 0.70 0.79 0.51  CALCIUM  --  10.0 9.5  MG  --  2.0  --    Liver Function Tests:  Recent Labs Lab 10/31/15 2236 11/01/15 0722  AST 23 21  ALT 17 16  ALKPHOS 64 63  BILITOT 0.1* 0.2*  PROT 6.9 6.8  ALBUMIN 3.6 3.4*   No results for input(s): LIPASE, AMYLASE in the last 168 hours.  Recent Labs Lab 10/31/15 2236  AMMONIA 35   CBC:  Recent Labs Lab 10/31/15 2212 10/31/15 2228 11/01/15 0722 11/02/15 0351  WBC 9.7  --  5.8 5.9  NEUTROABS  --   --  3.7  --   HGB 13.7 15.0 13.1 14.4  HCT 42.6 44.0 41.7 44.7  MCV 87.3  --  88.9 87.5  PLT 163  --  147* 152   Cardiac Enzymes:  Recent Labs Lab 11/01/15 0722 11/01/15 1019 11/01/15 1841  TROPONINI <0.03 <0.03 <0.03   BNP: BNP (last 3 results) No results for input(s): BNP in the last 8760 hours.  ProBNP (last 3 results) No results for input(s): PROBNP in the last 8760 hours.  CBG:  Recent Labs Lab 11/01/15 0739 11/01/15 1152 11/01/15 1619 11/02/15 0746 11/02/15 1218  GLUCAP 121* 114* 113* 118* 124*       Signed:  Debbe Odea, MD [facility] 11/02/2015, 1:50 PM

## 2015-11-02 NOTE — NC FL2 (Signed)
MEDICAID FL2 LEVEL OF CARE SCREENING TOOL     IDENTIFICATION  Patient Name: Destiny Robinson Birthdate: 1956-04-24 Sex: female Admission Date (Current Location): 10/31/2015  [facility] and [place] Number:  Herbalist and Address:  The . [facility], [place] [address] [place], [place] [address]      Provider Number: [id number]  Attending Physician Name and Address:  Debbe Odea, MD  Relative Name and Phone Number:       Current Level of Care: Hospital Recommended Level of Care: [place] Prior Approval Number:    Date Approved/Denied:   PASRR Number:    Discharge Plan: SNF    Current Diagnoses: Patient Active Problem List   Diagnosis Date Noted  . Chest pain 11/01/2015  . UTI (urinary tract infection) 11/01/2015  . Syncope 11/01/2015  . Altered mental status 08/08/2015  . Acute encephalopathy 08/08/2015  . Type 2 diabetes mellitus with vascular disease ([place]) 08/08/2015  . UTI (lower urinary tract infection)   . Other specified fever   . Malignant hypertension   . ICH (intracerebral hemorrhage) ([place]) 07/29/2015  . Migraine with aura and without status migrainosus, not intractable 04/15/2015  . Cephalalgia 12/11/2014  . Type 2 diabetes mellitus with other circulatory complications ([place]) [id number]  . Headache 10/30/2014  . Left sided numbness 10/30/2014  . HLD (hyperlipidemia) 10/30/2014  . OSA on CPAP 10/30/2014  . Obesity 10/30/2014  . Essential hypertension   . Left hip pain   . Sensory disturbance 10/02/2014  . Morbid obesity ([place]) 10/02/2014  . Cerebrovascular accident ([place]) 10/01/2014  . CVA (cerebral infarction) 10/01/2014  . Acute bronchitis 05/14/2013  . GERD (gastroesophageal reflux disease) 04/22/2013  . Obstructive sleep apnea 11/19/2012  . Dyspnea on exertion 11/19/2012  . Abdominal pain 12/21/2011  . GIB (gastrointestinal bleeding) 12/21/2011  . Soft tissue infection 12/21/2011  . Depression  12/11/2011  . Nausea, vomiting and diarrhea 12/10/2011  . HTN (hypertension) 12/10/2011  . Diabetes mellitus, type 2 ([place]) 12/10/2011  . Pancreatitis     Orientation RESPIRATION BLADDER Height & Weight    Self, Time, Situation, Place  Normal Incontinent   168 lbs.  BEHAVIORAL SYMPTOMS/MOOD NEUROLOGICAL BOWEL NUTRITION STATUS      Incontinent Diet (Low sodium heart healthy)  AMBULATORY STATUS COMMUNICATION OF NEEDS Skin   Extensive Assist Verbally Normal                       Personal Care Assistance Level of Assistance  Bathing, Feeding, Dressing Bathing Assistance: Limited assistance Feeding assistance: Limited assistance Dressing Assistance: Limited assistance     Functional Limitations Info  Sight, Hearing, Speech Sight Info: Impaired Hearing Info: Impaired Speech Info: Adequate    SPECIAL CARE FACTORS FREQUENCY  PT (By licensed PT), OT (By licensed OT)     PT Frequency: 3x OT Frequency: 3x            Contractures Contractures Info: Not present    Additional Factors Info  Code Status, Allergies, Psychotropic, Insulin Sliding Scale, Isolation Precautions Code Status Info: Full Code Allergies Info: Asprin, Codeine, Penicillin, Reglan, Xifaxan, Zocor, Crestor Psychotropic Info: Effexor Insulin Sliding Scale Info: 3x Isolation Precautions Info: None     Current Medications (11/02/2015):  This is the current hospital active medication list Current Facility-Administered Medications  Medication Dose Route Frequency Provider Last Rate Last Dose  . acetaminophen (TYLENOL) tablet 650 mg  650 mg Oral Q6H PRN Rise Patience, MD  650 mg at 11/02/15 1306   Or  . acetaminophen (TYLENOL) suppository 650 mg  650 mg Rectal Q6H PRN Rise Patience, MD      . albuterol (PROVENTIL) (2.5 MG/3ML) 0.083% nebulizer solution 3 mL  3 mL Inhalation Q4H PRN Rise Patience, MD      . amLODipine (NORVASC) tablet 5 mg  5 mg Oral Daily Rise Patience, MD   5 mg  at 11/02/15 0848  . antiseptic oral rinse (CPC / CETYLPYRIDINIUM CHLORIDE 0.05%) solution 7 mL  7 mL Mouth Rinse q12n4p Rise Patience, MD   7 mL at 11/02/15 1302  . aspirin EC tablet 81 mg  81 mg Oral Daily Rise Patience, MD   81 mg at 11/01/15 1000  . chlorhexidine (PERIDEX) 0.12 % solution 15 mL  15 mL Mouth Rinse BID Rise Patience, MD   15 mL at 11/02/15 0849  . clopidogrel (PLAVIX) tablet 75 mg  75 mg Oral Daily Rise Patience, MD   75 mg at 11/02/15 0849  . doxycycline (VIBRA-TABS) tablet 100 mg  100 mg Oral Q12H Debbe Odea, MD   100 mg at 11/02/15 0850  . enoxaparin (LOVENOX) injection 40 mg  40 mg Subcutaneous Q24H Rise Patience, MD   40 mg at 11/01/15 1558  . fluticasone (FLONASE) 50 MCG/ACT nasal spray 2 spray  2 spray Each Nare Daily PRN Rise Patience, MD      . fosfomycin (MONUROL) packet 3 g  3 g Oral Once Debbe Odea, MD      . hydrALAZINE (APRESOLINE) injection 10 mg  10 mg Intravenous Q4H PRN Rise Patience, MD      . hydrALAZINE (APRESOLINE) tablet 50 mg  50 mg Oral 3 times per day Rise Patience, MD   50 mg at 11/02/15 0608  . insulin aspart (novoLOG) injection 0-9 Units  0-9 Units Subcutaneous TID WC Rise Patience, MD   1 Units at 11/02/15 1302  . insulin detemir (LEVEMIR) injection 40 Units  40 Units Subcutaneous QPM Rise Patience, MD   40 Units at 11/01/15 1820  . labetalol (NORMODYNE) tablet 100 mg  100 mg Oral BID Rise Patience, MD   100 mg at 11/02/15 0851  . lisinopril (PRINIVIL,ZESTRIL) tablet 20 mg  20 mg Oral BID Rise Patience, MD   20 mg at 11/02/15 0851  . LORazepam (ATIVAN) tablet 0.5 mg  0.5 mg Oral Q6H PRN Rise Patience, MD   0.5 mg at 11/01/15 2326  . losartan (COZAAR) tablet 100 mg  100 mg Oral Daily Debbe Odea, MD   100 mg at 11/02/15 0852  . ondansetron (ZOFRAN) tablet 4 mg  4 mg Oral Q6H PRN Rise Patience, MD   4 mg at 11/02/15 1306   Or  . ondansetron (ZOFRAN) injection 4 mg   4 mg Intravenous Q6H PRN Rise Patience, MD      . pravastatin (PRAVACHOL) tablet 80 mg  80 mg Oral QHS Rise Patience, MD   80 mg at 11/01/15 2106  . senna-docusate (Senokot-S) tablet 1 tablet  1 tablet Oral BID Rise Patience, MD   1 tablet at 11/01/15 2107  . tiZANidine (ZANAFLEX) tablet 4 mg  4 mg Oral QID PRN Rise Patience, MD      . venlafaxine XR (EFFEXOR-XR) 24 hr capsule 150 mg  150 mg Oral Q breakfast Rise Patience, MD   150 mg at 11/02/15 1041  Discharge Medications: Please see discharge summary for a list of discharge medications.  Relevant Imaging Results:  Relevant Lab Results:   Additional Information    [place], LCSW

## 2015-11-03 LAB — GLUCOSE, CAPILLARY: Glucose-Capillary: 117 mg/dL — ABNORMAL HIGH (ref 65–99)

## 2015-11-03 LAB — URINE CULTURE: Culture: >=100000

## 2016-01-09 ENCOUNTER — Encounter: Payer: Self-pay | Admitting: Nurse Practitioner

## 2016-01-09 ENCOUNTER — Ambulatory Visit (INDEPENDENT_AMBULATORY_CARE_PROVIDER_SITE_OTHER): Payer: Medicare Other | Admitting: Nurse Practitioner

## 2016-01-09 VITALS — BP 142/82 | HR 104 | Temp 97.0°F | Ht 62.0 in

## 2016-01-09 DIAGNOSIS — I10 Essential (primary) hypertension: Secondary | ICD-10-CM | POA: Diagnosis not present

## 2016-01-09 DIAGNOSIS — I6330 Cerebral infarction due to thrombosis of unspecified cerebral artery: Secondary | ICD-10-CM | POA: Diagnosis not present

## 2016-01-09 DIAGNOSIS — I610 Nontraumatic intracerebral hemorrhage in hemisphere, subcortical: Secondary | ICD-10-CM | POA: Diagnosis not present

## 2016-01-09 DIAGNOSIS — G8110 Spastic hemiplegia affecting unspecified side: Secondary | ICD-10-CM | POA: Diagnosis not present

## 2016-01-09 DIAGNOSIS — F329 Major depressive disorder, single episode, unspecified: Secondary | ICD-10-CM | POA: Diagnosis not present

## 2016-01-09 DIAGNOSIS — F32A Depression, unspecified: Secondary | ICD-10-CM

## 2016-01-09 NOTE — Progress Notes (Signed)
[facility]  PATIENT: Destiny Robinson DOB: May 02, 1956   REASON FOR VISIT: follow up for stroke 10/21/15, with risk factors essential hypertension obesity, obstructive sleep apnea, diabetes HISTORY FROM:patient and daughters    HISTORY OF PRESENT ILLNESS: HISTORY: Destiny Robinson is a 60 y.o. female with PMH of hypertension, diabetes, diabetic gastroparesis, hyperlipidemia, obesity, OSA on CPAP who presents as a new patient for episodic left-sided weakness on 10/30/14.  Patient stated that since October last year she started to have this episodic left-sided numbness, involving left face, arm and leg. It happens 4-5 times per months for October, November and December. Most time it happens one patient was sitting still. The numbness happens acutely, lasting about 30 minutes and resolved spontaneously. Starting in December, her episode was followed by headache, the headache is more bilateral frontal temporal and occipital, 8-9/10, she has to take 2 x 500mg  Tylenol for the headache and take a nap for 2 hours. After the nap headache was gone. Blurry vision was associated with headache, denies nausea vomiting, photophobia phonophobia. After each episode she will feeding generalized weakness for a while and recovered. On 10/01/2014, she had again left-sided numbness, however, it did not resolve within 30 minutes. After 2 hours, she presented herself to the ER for further evaluation. CAT scan of the head showed no acute abnormality, but bilateral basal ganglia lacunar infarcts. MRI brain confirmed no acute infarcts but bilateral basal ganglia lacunar infarcts. Her other stroke workup negative, except high LDL and TG. The numbness lasted about 12 hours and then subsided. She was put on aspirin 81 and discharged with outpatient follow-up. After discharge, her episode was initially subsided, however for the last 3 days, she had again 5 times of left-sided numbness episodes followed by headache.  She came in today for further evaluation. She had a history of hyperlipidemia, follow-up with her PCP [name] after discharge, and was put on pravastatin 80 mg, fenofibrate, and niacin, and was told her TG and LDL were much better. She had allergic reaction to Lipitor, and not able to tolerate with Crestor or simvastatin. She had a history of hypertension on HCTZ, losartan, and metoprolol. She stated her blood pressure at home around 135/80, but today in clinic 156/93. She has a history of diabetes, diabetic neuropathy, diabetic gastroparesis, on insulin injection, metformin and glipizide. She said her sugar was between 80-90 at home, her A1c was 6.6 in December. She denies any history of migraine, however her sister and her mom have migraine. She denies any visual aura with headache, but admits to headache associated with the blurry vision. She had a history of neuropathy, has left lower extremity below knee numbness before the admission, constant and 2 now. She is obese, former smoker but quit at age of 81. She had OSA on CPAP, has been followed with pulmonology. She denies any alcohol drinking, or illicit drug use.  12/11/14 follow up DrXu the patient has been doing well. She was put on nortriptyline after last visit. She stated that for the last 1+ month, she only had one episode on 11/18/14 with left sided numbness lasting 30 min followed by headache and she had to sleep for 2 hours. The frequency much decreased than before. She had EEG which showed no seizure activity. Her BP 148/79 today but normally 135/85 at home. Her glucose between 50-150 at home. She has appointment on 01/04/15 with PCP.   Interval History7/11/16Dr. XU During the interval time, pt is doing OK. She had  a fall on 01/02/15 and had fracture at left forearm. She had surgery and currently recovering well, no functional loss. Her BP 127/82 and no recurrent symptoms. No more headaches.   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 She awakened that morning, was attempting to get out of bed fell and hit the nightstand and was then unable to get off the floor. She had acute onset left sided weakness causing her fall she has multiple medical problems to include diabetes hypertension hyperlipidemia obesity. She also has obstructive sleep apnea and uses CPAP. Bleed resulted in left hemiplegia and dysarthria left-sided neglect. CTA of the head and neck right P2 stenosis but otherwise unremarkable. Carotid Doppler unremarkable. 2-D echo unremarkable. LDL unable to calculate due to TG 405. Hemoglobin A1c 6.7. She was on Plavix prior to admission now on no anti-thrombotic agents due to [place]. She was discharged to skilled facility to receive physical therapy occupational therapy and speech therapy. She returns for reevaluation she is currently being treated for urinary tract infection.  UPDATE 04/06/2017[name], 60 year old female returns for  three-month follow-up. Repeat CT of the head 11/01/15  shows resolution of ICH. Per Dr. Erlinda Hong previous comments please restart ASA .81mg  daily For secondary strioke prevention. This information was given to the nursing facility however she remains on Plavix and aspirin. The patient is currently on Plavix by PCP.  Her speech therapy has concluded. She has plateaued with her physical therapy. She continues to be anxious and have crying spells. . She is seeing psychiatry twice a week at the nursing home and is currently on Effexor and Nuedexta.  She has not had further stroke symptoms she returns for reevaluation.  Patient requires max assist for transfers   REVIEW OF SYSTEMS: Full 14 system review of systems performed and notable only for those listed, all others are neg:  Constitutional:  fatigue  Cardiovascular: neg Ear/Nose/Throat: neg  Skin: neg Eyes: neg Respiratory: neg Gastroitestinal: neg    Hematology/Lymphatic: neg  Endocrine: neg Musculoskeletal:neg Allergy/Immunology: neg Neurological:  weakness Psychiatric:  Agitation and confusion depression, hallucinations treated by psychiatry Sleep :  Obstructive sleep apnea with CPAP   ALLERGIES: Allergies  Allergen Reactions  . Aspirin Other (See Comments)    Bleeding events with 325mg  dose, unsure about 81mg ?  . Codeine Hypertension  . Penicillins Anaphylaxis    Has patient had a PCN reaction causing immediate rash, facial/tongue/throat swelling, SOB or lightheadedness with hypotension: Yes Has patient had a PCN reaction causing severe rash involving mucus membranes or skin necrosis: No Has patient had a PCN reaction that required hospitalization Yes Has patient had a PCN reaction occurring within the last 10 years: No If all of the above answers are "NO", then may proceed with Cephalosporin use.   . Reglan [Metoclopramide] Swelling and Rash    5MG  MAKES THROAT SWELL  . Xifaxan [Rifaximin] Hives, Shortness Of Breath and Swelling    550MG  MAKES THROAT SWELL and SHORTNESS OF BREATH  . Zocor [Simvastatin] Anaphylaxis and Hives  . Crestor [Rosuvastatin] Other (See Comments)    Cramping and Muscles ache    HOME MEDICATIONS: Outpatient Prescriptions Prior to Visit  Medication Sig Dispense Refill  . acetaminophen (TYLENOL) 500 MG tablet Take 1,000 mg by mouth daily as needed for fever or headache (headache).     Marland Kitchen aluminum-magnesium hydroxide-simethicone (MAALOX) I037812 MG/5ML SUSP Take 30 mLs by mouth every 4 (four) hours as needed.    Marland Kitchen  amLODipine (NORVASC) 5 MG tablet Take 5 mg by mouth daily.    Marland Kitchen aspirin EC 81 MG tablet Take 1 tablet (81 mg total) by mouth daily. 30 tablet 5  . clopidogrel (PLAVIX) 75 MG tablet Take 75 mg by mouth daily.    Marland Kitchen Dextromethorphan-Quinidine (NUEDEXTA) 20-10 MG CAPS Take by mouth 2 (two) times daily.    Marland Kitchen doxycycline (VIBRA-TABS) 100 MG tablet Take 1 tablet (100 mg total) by mouth every  12 (twelve) hours. 6 tablet 0  . fluticasone (FLONASE) 50 MCG/ACT nasal spray Place 2 sprays into both nostrils daily as needed for allergies.     Marland Kitchen gabapentin (NEURONTIN) 300 MG capsule Take 2 capsules (600 mg total) by mouth 3 (three) times daily. (Patient taking differently: Take 1,200 mg by mouth 3 (three) times daily. ) 180 capsule 11  . hydrALAZINE (APRESOLINE) 50 MG tablet Take 1 tablet (50 mg total) by mouth every 8 (eight) hours. 90 tablet 2  . insulin detemir (LEVEMIR) 100 UNIT/ML injection Inject 0.4 mLs (40 Units total) into the skin daily. (Patient taking differently: Inject 40 Units into the skin every evening. ) 10 mL 11  . labetalol (NORMODYNE) 100 MG tablet Take 1 tablet (100 mg total) by mouth 2 (two) times daily. 90 tablet 2  . lisinopril (PRINIVIL,ZESTRIL) 10 MG tablet Take 1 tablet (10 mg total) by mouth 2 (two) times daily. (Patient taking differently: Take 20 mg by mouth 2 (two) times daily. )    . LORazepam (ATIVAN) 0.5 MG tablet Take 1 tablet (0.5 mg total) by mouth 4 (four) times daily. FOR ANXIETY 30 tablet 0  . losartan (COZAAR) 100 MG tablet Take 100 mg by mouth daily.    . meclizine (ANTIVERT) 12.5 MG tablet Take 12.5 mg by mouth 3 (three) times daily as needed for dizziness.    . metFORMIN (GLUCOPHAGE-XR) 500 MG 24 hr tablet Take 500 mg by mouth daily with breakfast.    . ondansetron (ZOFRAN-ODT) 4 MG disintegrating tablet Take 1 tablet (4 mg total) by mouth every 8 (eight) hours as needed for nausea or vomiting. 10 tablet 0  . polyvinyl alcohol (LIQUIFILM TEARS) 1.4 % ophthalmic solution Place 1 drop into both eyes 4 (four) times daily. Reported on 11/01/2015    . pravastatin (PRAVACHOL) 80 MG tablet Take 1 tablet (80 mg total) by mouth daily. (Patient taking differently: Take 80 mg by mouth at bedtime. ) 30 tablet 2  . PROAIR HFA 108 (90 BASE) MCG/ACT inhaler USE 2 PUFFS EVERY 6 HOURS AS NEEDED FOR WHEEZING 8.5 g 4  . senna-docusate (SENOKOT-S) 8.6-50 MG tablet Take 1  tablet by mouth 2 (two) times daily as needed for mild constipation. 30 tablet 2  . tiZANidine (ZANAFLEX) 4 MG capsule Take 4 mg by mouth 4 (four) times daily as needed for muscle spasms.    . traMADol (ULTRAM) 50 MG tablet Take 50 mg by mouth 3 (three) times daily.    Marland Kitchen venlafaxine XR (EFFEXOR-XR) 150 MG 24 hr capsule Take 150 mg by mouth daily with breakfast.     No facility-administered medications prior to visit.    PAST MEDICAL HISTORY: Past Medical History  Diagnosis Date  . Hypertension   . Hypercholesterolemia   . Bronchitis   . Pancreatitis   . Complication of anesthesia 12/11/11    "angry, mean, hateful after" endoscopy & colonoscopy  . Asthma ~ 1991  . Pneumonia 06/2011; 07/2011  . Diabetes mellitus type 2, controlled ([place])   . GERD (  gastroesophageal reflux disease)   . GI bleeding 12/21/11  . Hx of colonoscopy with polypectomy 12/11/11    "took out 7; couldn't get #8, that one was precancerous"  . Depression   . 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 Paralysis gastric   . Kidney stones   . Sleep apnea     uses a cpap-oxygen at night  . Stroke [facility])     PAST SURGICAL HISTORY: Past Surgical History  Procedure Laterality Date  . Cesarean section  1981; 1986  . Esophagogastroduodenoscopy  12/11/2011    Procedure: ESOPHAGOGASTRODUODENOSCOPY (EGD);  Surgeon: Missy Sabins, MD;  Location: [facility] ENDOSCOPY;  Service: Endoscopy;  Laterality: N/A;  . Colonoscopy  12/11/2011    Procedure: COLONOSCOPY;  Surgeon: Missy Sabins, MD;  Location: [place];  Service: Endoscopy;  Laterality: N/A;  . Abdominal hysterectomy  2002  . Cholecystectomy  ~ 2002  . Tonsillectomy and adenoidectomy  1988  . Appendectomy  ~ 2002    "w/hysterectomy"  . Colonoscopy  04/26/2012    Procedure: COLONOSCOPY;  Surgeon: Wonda Horner, MD;  Location: [facility] ENDOSCOPY;  Service: Endoscopy;  Laterality: N/A;  apc  . Hernia repair      Umbilical  . Open reduction internal  fixation (orif) distal radial fracture Left 01/07/2015    Procedure: OPEN REDUCTION INTERNAL FIXATION (ORIF) LEFT DISTAL RADIAL FRACTURE;  Surgeon: Milly Jakob, MD;  Location: [place];  Service: Orthopedics;  Laterality: Left;  . Knee arthroscopy with medial menisectomy Right 03/29/2015    Procedure: ARTHROSCOPY KNEE, medial menisectomy, removal of loose body;  Surgeon: Frederik Pear, MD;  Location: [place];  Service: Orthopedics;  Laterality: Right;  RIGHT KNEE ARTHROSCOPY    FAMILY HISTORY: Family History  Problem Relation Age of Onset  . Colon cancer Sister   . Alzheimer's disease Mother   . Heart disease Father     Enlarged heart    SOCIAL HISTORY: Social History   Social History  . Marital Status: Widowed    Spouse Name: N/A  . Number of Children: 2  . Years of Education: 12   Occupational History  . Not on file.   Social History Main Topics  . Smoking status: Former Smoker -- 0.12 packs/day for 6 years    Types: Cigarettes    Quit date: 12/09/1996  . Smokeless tobacco: Never Used  . Alcohol Use: No  . Drug Use: No  . Sexual Activity: No     Comment: complete hysterectomy   Other Topics Concern  . Not on file   Social History Narrative   Patient is widowed with 2 children.   Patient is right handed.   Patient has hs education.   Patient drinks 36 oz daily.     PHYSICAL EXAM  Filed Vitals:   01/09/16 1030  BP: 142/82  Pulse: 104  Temp: 97 F (36.1 C)  TempSrc: Oral  Height: 5\' 2"  (1.575 m)   There is no weight on file to calculate BMI. Generalized: Well developed,  obese female in no acute distress  Head: normocephalic and atraumatic,. Oropharynx benign  Neck: Supple, no carotid bruits  Cardiac: Regular rate rhythm, no murmur  Musculoskeletal: No deformity   Neurological examination   Mentation: Alert oriented to time, place, history taking. Attention span and concentration appropriate. Recent and remote  memory intact. Follows all commands speech with mild dysarthria .   Cranial nerve II-XII:  Fundoscopic exam deferred .Pupils were equal round reactive to light extraocular movements were full, visual field were full on confrontational test. Facial sensation intact left facial droop. hearing was intact to finger rubbing bilaterally. Uvula tongue midline. head turning and shoulder shrug decreased on the left .Tongue protrusion into cheek strength was normal. Motor: 0/5 left upper extremity and 2/5 left lower extremity, 5 over 5 right upper and right lower extremity  Sensory: normal and symmetric to light touch, pinprick, and Vibration, on the right, diminished on the left  Coordination: finger-nose-finger, normal on the right unable to perform on the left  Reflexes: Brachioradialis 2/2, biceps 2/2, triceps 2/2, patellar 2/2, Achilles 2/2, plantar responses were flexor bilaterally. Gait and Station: Not ambulated in wheelchair   DIAGNOSTIC DATA (LABS, IMAGING, TESTING) - I reviewed patient records, labs, notes, testing and imaging myself where available.  Lab Results  Component Value Date   WBC 5.9 11/02/2015   HGB 14.4 11/02/2015   HCT 44.7 11/02/2015   MCV 87.5 11/02/2015   PLT 152 11/02/2015      Component Value Date/Time   NA 142 11/01/2015 0722   K 3.7 11/01/2015 0722   CL 105 11/01/2015 0722   CO2 30 11/01/2015 0722   GLUCOSE 129* 11/01/2015 0722   BUN 10 11/01/2015 0722   CREATININE 0.51 11/01/2015 0722   CALCIUM 9.5 11/01/2015 0722   PROT 6.8 11/01/2015 0722   ALBUMIN 3.4* 11/01/2015 0722   AST 21 11/01/2015 0722   ALT 16 11/01/2015 0722   ALKPHOS 63 11/01/2015 0722   BILITOT 0.2* 11/01/2015 0722   GFRNONAA >60 11/01/2015 0722   GFRAA >60 11/01/2015 0722   Lab Results  Component Value Date   CHOL 237* 07/30/2015   HDL 47 07/30/2015   LDLCALC UNABLE TO CALCULATE IF TRIGLYCERIDE OVER 400 mg/dL 07/30/2015   TRIG 405* 07/30/2015   CHOLHDL 5.0 07/30/2015   Lab  Results  Component Value Date   HGBA1C 6.7* 07/30/2015   No results found for: [id number] Lab Results  Component Value Date   TSH 0.569 08/08/2015      ASSESSMENT AND PLAN  60 y.o. year old female  has a past medical history of Hypertension; Hypercholesterolemia; Diabetes mellitus type 2, controlled ([place]); GERD (gastroesophageal reflux disease); GI bleeding (12/21/11); (12/11/11); Depression; Sleep apnea; and Stroke ([place]).and  right thalamic hemorrhage here to follow up. CT of the head 11/01/15  shows resolution of ICH   PLAN:  Continue aspirin .81mg  , stop Plavix.This was reported to nsg home back in January but Plavix  continues Diabetes continue with  CBG monitoring, A1c less than 6.5 Hypertension continue amlodipine and labetalol B/P today 142/82 Hyperlipidemia continue pravastatin  Obstructive sleep apnea continue CPAP For depression continue Effexor and Nuedexta, continue follow up with psychiatry Left-sided weakness and mild dysarthria  , needs max assist to transfer Follow-up in 6 months visit time face-to-face 25 next with Dr. Mertha Finders, [facility], [facility], APRN  [facility] [address] [place], [place] [address] [phone]

## 2016-01-09 NOTE — Patient Instructions (Signed)
Per nursing home sheet 

## 2016-01-11 NOTE — Progress Notes (Signed)
Rosalin Hawking, MD PhD Stroke Neurology 01/11/2016 11:15 AM

## 2016-01-16 ENCOUNTER — Ambulatory Visit: Payer: Medicare Other | Admitting: Nurse Practitioner

## 2016-07-07 ENCOUNTER — Telehealth: Payer: Self-pay | Admitting: Cardiology

## 2016-07-07 NOTE — Telephone Encounter (Signed)
Returned call to patient's sister Santiago Glad.She stated sister is living at [facility] in [place].Stated she is not having sob.Stated she had a stroke last year and gets confused.Stated she was told she only has to see Dr.Hochrein if needed.Stated she will call back if she has any problems.

## 2016-07-07 NOTE — Telephone Encounter (Signed)
Returned call to patient.No answer.LMTC. 

## 2016-07-07 NOTE — Telephone Encounter (Signed)
New message ° ° ° ° °Returning a call to the nurse °

## 2016-07-07 NOTE — Telephone Encounter (Signed)
New message    Pt experiencing SOB for about 2 weeks.   Pt c/o Shortness Of Breath: STAT if SOB developed within the last 24 hours or pt is noticeably SOB on the phone  1. Are you currently SOB (can you hear that pt is SOB on the phone)? Yes  2. How long have you been experiencing SOB? about 2 weeks  3. Are you SOB when sitting or when up moving around? Mostly when she moves around  4. Are you currently experiencing any other symptoms? chest heaviness  I schedule the pt for 10/17 with Hochrein. Pt request that you call her back within the next hour b/c she will be getting up for lunch soon.

## 2016-07-09 ENCOUNTER — Encounter: Payer: Self-pay | Admitting: Neurology

## 2016-07-09 ENCOUNTER — Ambulatory Visit (INDEPENDENT_AMBULATORY_CARE_PROVIDER_SITE_OTHER): Payer: Medicare Other | Admitting: Neurology

## 2016-07-09 VITALS — BP 129/75 | HR 91 | Ht 62.0 in

## 2016-07-09 DIAGNOSIS — I10 Essential (primary) hypertension: Secondary | ICD-10-CM | POA: Diagnosis not present

## 2016-07-09 DIAGNOSIS — E1159 Type 2 diabetes mellitus with other circulatory complications: Secondary | ICD-10-CM | POA: Diagnosis not present

## 2016-07-09 DIAGNOSIS — G8110 Spastic hemiplegia affecting unspecified side: Secondary | ICD-10-CM | POA: Diagnosis not present

## 2016-07-09 DIAGNOSIS — I610 Nontraumatic intracerebral hemorrhage in hemisphere, subcortical: Secondary | ICD-10-CM

## 2016-07-09 DIAGNOSIS — I6330 Cerebral infarction due to thrombosis of unspecified cerebral artery: Secondary | ICD-10-CM

## 2016-07-09 NOTE — Progress Notes (Signed)
STROKE NEUROLOGY FOLLOW UP NOTE  NAME: Destiny Robinson DOB: [birth year]  REASON FOR VISIT: stroke follow up HISTORY FROM: pt and chart  Today we had the pleasure of seeing Destiny Robinson in follow-up at our Neurology Clinic. Pt was accompanied by no one.   History Summary Destiny Robinson is a 60 y.o. female with PMH of hypertension, diabetes, diabetic gastroparesis, hyperlipidemia, obesity, OSA on CPAP who presents as a new patient for episodic left-sided weakness on 10/30/14.   Patient stated that since October last year she started to have this episodic left-sided numbness, involving left face, arm and leg. It happens 4-5 times per months for October, November and December. Most time it happens one patient was sitting still. The numbness happens acutely, lasting about 30 minutes and resolved spontaneously. Starting in December, her episode was followed by headache, the headache is more bilateral frontal temporal and occipital, 8-9/10, she has to take 2 x 500mg  Tylenol for the headache and take a nap for 2 hours. After the nap headache was gone. Blurry vision was associated with headache, denies nausea vomiting, photophobia phonophobia. After each episode she will feeding generalized weakness for a while and recovered. On 10/01/2014, she had again left-sided numbness, however, it did not resolve within 30 minutes. After 2 hours, she presented herself to the ER for further evaluation. CAT scan of the head showed no acute abnormality, but bilateral basal ganglia lacunar infarcts. MRI brain confirmed no acute infarcts but bilateral basal ganglia lacunar infarcts. Her other stroke workup negative, except high LDL and TG. The numbness lasted about 12 hours and then subsided. She was put on aspirin 81 and discharged with outpatient follow-up. After discharge, her episode was initially subsided, however for the last 3 days, she had again 5 times of left-sided numbness episodes followed by headache. She came  in today for further evaluation. She had a history of hyperlipidemia, follow-up with her PCP [name] after discharge, and was put on pravastatin 80 mg, fenofibrate, and niacin, and was told her TG and LDL were much better. She had allergic reaction to Lipitor, and not able to tolerate with Crestor or simvastatin. She had a history of hypertension on HCTZ, losartan, and metoprolol. She stated her blood pressure at home around 135/80, but today in clinic 156/93. She has a history of diabetes, diabetic neuropathy, diabetic gastroparesis, on insulin injection, metformin and glipizide. She said her sugar was between 80-90 at home, her A1c was 6.6 in December. She denies any history of migraine, however her sister and her mom have migraine. She denies any visual aura with headache, but admits to headache associated with the blurry vision. She had a history of neuropathy, has left lower extremity below knee numbness before the admission, constant and 2 now. She is obese, former smoker but quit at age of 16. She had OSA on CPAP, has been followed with pulmonology. She denies any alcohol drinking, or illicit drug use.  12/11/14 follow up -  the patient has been doing well. She was put on nortriptyline after last visit. She stated that for the last 1+ month, she only had one episode on 11/18/14 with left sided numbness lasting 30 min followed by headache and she had to sleep for 2 hours. The frequency much decreased than before. She had EEG which showed no seizure activity. Her BP 148/79 today but normally 135/85 at home. Her glucose between 50-150 at home. She has appointment on 01/04/15 with PCP.    04/15/15  follow up - pt is doing OK. She had a fall on 01/02/15 and had fracture at left forearm. She had surgery and currently recovering well, no functional loss. Her BP 127/82 and no recurrent symptoms. No more headaches.   07/29/15 admission - pt was admitted to [facility] due to right BG/thalamus IVH and ICH. CTA of the  head and neck right P2 stenosis but otherwise unremarkable. Carotid Doppler unremarkable. 2-D echo unremarkable. LDL unable to calculate due to TG 405. Hemoglobin A1c 6.7. MRI showed no infarct. Her Plavix was stopped due to [place]. She was discharged to SNF to receive PT/OT and speech therapy.  10/18/15 follow up (CM) pt has been doing well. BP stable in rehab. Receiving therapies.   01/09/16 follow up (CM) - Repeat CT of the head 11/01/15  shows resolution of ICH. Per Dr. Erlinda Hong previous comments please restart ASA .81mg  daily For secondary strioke prevention. This information was given to the nursing facility however she remains on Plavix and aspirin. The patient is currently on Plavix by PCP.  Her speech therapy has concluded. She has plateaued with her physical therapy. She continues to be anxious and have crying spells. . She is seeing psychiatry twice a week at the nursing home and is currently on Effexor and Nuedexta.  She has not had further stroke symptoms she returns for reevaluation.  Patient requires max assist for transfers  Interval History During the interval time, pt has been doing the same. 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 Left UE and LE increase muscle tone but able to move some. Left shoulder pain with limited ROM. BP controlled with 5 BP meds, today 129/75. Glucose under control, last A1C 6.5 and LDL 95. She has no PT/OT now. Has not followed up with rehab yet or received any botox.   REVIEW OF SYSTEMS: Full 14 system review of systems performed and notable only for those listed below and in HPI above, all others are negative:  Constitutional:  Excessive sweating Cardiovascular:  Ear/Nose/Throat:   Skin:  Eyes:   Respiratory:   Gastroitestinal:   Genitourinary:  Hematology/Lymphatic:   Endocrine:  Musculoskeletal:  Joint pain, walking difficulties Allergy/Immunology:   Neurological:  Dizziness, numbness, HA, memory loss Psychiatric: depression, agitation, behavior  problem, confusion, decreased concentration, nervous, anxious, hallucination Sleep: apnea, snoring  The following represents the patient's updated allergies and side effects list: Allergies  Allergen Reactions  . Aspirin Other (See Comments)    Bleeding events with 325mg  dose, unsure about 81mg ?  . Codeine Hypertension  . Penicillins Anaphylaxis    Has patient had a PCN reaction causing immediate rash, facial/tongue/throat swelling, SOB or lightheadedness with hypotension: Yes Has patient had a PCN reaction causing severe rash involving mucus membranes or skin necrosis: No Has patient had a PCN reaction that required hospitalization Yes Has patient had a PCN reaction occurring within the last 10 years: No If all of the above answers are "NO", then may proceed with Cephalosporin use.   . Reglan [Metoclopramide] Swelling and Rash    5MG  MAKES THROAT SWELL  . Xifaxan [Rifaximin] Hives, Shortness Of Breath and Swelling    550MG  MAKES THROAT SWELL and SHORTNESS OF BREATH  . Zocor [Simvastatin] Anaphylaxis and Hives  . Crestor [Rosuvastatin] Other (See Comments)    Cramping and Muscles ache    The neurologically relevant items on the patient's problem list were reviewed on today's visit.  Neurologic Examination  A problem focused neurological exam (12 or more points of the single  system neurologic examination, vital signs counts as 1 point, cranial nerves count for 8 points) was performed.  Blood pressure 129/75, pulse 91, height 5\' 2"  (1.575 m).  General - obese, well developed, in no apparent distress.  Ophthalmologic - fundi not visualized due to noncooperation.  Cardiovascular - Regular rate and rhythm with no murmur.  Mental Status -  Level of arousal and orientation to time, place, and person were intact. Language including expression, naming, repetition, comprehension, reading, and writing was assessed and found intact.  Cranial Nerves II - XII - II - Visual field intact  OU, however, significant eye movement consistent with psychological impersistence. III, IV, VI - Extraocular movements intact. V - Facial sensation intact bilaterally. VII - left nasal labial fold flattening, left facial droop. VIII - Hearing & vestibular intact bilaterally. X - Palate elevates symmetrically. XI - Chin turning & shoulder shrug intact bilaterally. XII - Tongue protrusion intact.  Motor Strength - The patient's strength was 5/5 RUE and RLE, 2+/5 LUE proximal, 3/5 wrist extension and flexion, 0/5 finger movement, 2+/5 LLE proximal, 3-/5 DF and PF, but no toe movement.  Bulk was normal and fasciculations were absent.   Motor Tone - Muscle tone was assessed at the neck and appendages and was significant increased LUE and LLE.  Reflexes - The patient's reflexes were 3+ LUE and LLE and she had positive babinski on the left.  Sensory - Light touch, temperature/pinprick were assessed and were decreased LLE and b/l feet    Coordination - The patient had normal movements in the right hand with no ataxia or dysmetria.  Tremor was absent.  Gait and Station - not able to test.  Data reviewed: I personally reviewed the images and agree with the radiology interpretations.  CT head 10/01/14 -  1. Age-indeterminate lacunar infarcts in the right and left basal ganglia. 2. Ectasia of the basilar artery, described previously.  MRI and MRA head and neck 10/01/14 Negative for acute infarct. Moderately severe chronic microvascular ischemic changes. Moderately severe chronic micro hemorrhage in the brain. 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 No focal intracranial stenosis  CUS - Bilateral: 1-39% ICA stenosis. Vertebral artery flow is antegrade.  2D echo - - Left ventricle: The cavity size was normal. Wall thickness was normal. Systolic function was mildly reduced. The estimated ejection fraction was in the  range of 45% to 50%. Wall motion was normal; there were no regional wall motion abnormalities. Doppler parameters are consistent with abnormal left ventricular relaxation (grade 1 diastolic dysfunction). - Mitral valve: Calcified annulus. Mildly thickened leaflets . - Pulmonary arteries: PA peak pressure: 38 mm Hg (S).  Component  Latest Ref Rng 10/02/2014  Cholesterol  0 - 200 mg/dL 259 (H)  Triglycerides  <150 mg/dL 523 (H)  HDL  >39 mg/dL 33 (L)  Total CHOL/HDL Ratio   7.8  VLDL  0 - 40 mg/dL UNABLE TO CALCULATE IF TRIGLYCERIDE OVER 400 mg/dL  LDL (calc)  0 - 99 mg/dL UNABLE TO CALCULATE IF TRIGLYCERIDE OVER 400 mg/dL  Hgb A1c MFr Bld  <5.7 % 6.6 (H)  Mean Plasma Glucose  <117 mg/dL 143 (H)         CT head 07/29/15 1. Right thalamic hemorrhage with 6 mm of midline shift from right to left. 2. Intraventricular hemorrhage. 3. Old left basal ganglia lacunar infarct. 4. 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  CTA head and neck 07/30/15 CT head: Evolving RIGHT  thalamic intraparenchymal hematoma, no suspicious enhancement. CTA neck: No acute vascular or hemodynamically significant stenosis. Tortuous vessels compatible with chronic hypertension. CTA head:   No acute large vessel occlusion  Moderate to high-grade stenosis RIGHT P2 segment (likely thromboembolic, less likely focal vasospasm). Dolicoectatic intracranial vessels compatible with chronic hypertension.  MRI 08/08/15 No acute infarct. Evolving subacute RIGHT thalamic intraparenchymal hemorrhage. Persistent mass effect on the third ventricle with intraventricular extension and mild hydrocephalus. Similar micro hemorrhages most compatible with chronic hypertension, corroborated by dolicoectatic intracranial vessels. Evidence of old subarachnoid hemorrhage/redistributed blood products. Mild parenchymal brain volume loss for age. Fall  bilateral basal ganglia lacunar infarcts.  CT head 11/01/15 1. No acute intracranial pathology seen on CT. 2. Chronic infarct again noted at the right thalamus, and chronic lacunar infarct at the left basal ganglia. 3. Scattered small vessel ischemic microangiopathy.  Component     Latest Ref Rng & Units 07/30/2015  Cholesterol     0 - 200 mg/dL 237 (H)  Triglycerides     <150 mg/dL 405 (H)  HDL Cholesterol     >40 mg/dL 47  Total CHOL/HDL Ratio     RATIO 5.0  VLDL     0 - 40 mg/dL UNABLE TO CALCULATE IF TRIGLYCERIDE OVER 400 mg/dL  LDL (calc)     0 - 99 mg/dL UNABLE TO CALCULATE IF TRIGLYCERIDE OVER 400 mg/dL  Hemoglobin A1C     4.8 - 5.6 % 6.7 (H)  Mean Plasma Glucose     mg/dL 146  TSH     0.350 - 4.500 uIU/mL   Ammonia     9 - 35 umol/L    Component     Latest Ref Rng & Units 08/08/2015 10/31/2015  Cholesterol     0 - 200 mg/dL    Triglycerides     <150 mg/dL    HDL Cholesterol     >40 mg/dL    Total CHOL/HDL Ratio     RATIO    VLDL     0 - 40 mg/dL    LDL (calc)     0 - 99 mg/dL    Hemoglobin A1C     4.8 - 5.6 %    Mean Plasma Glucose     mg/dL    TSH     0.350 - 4.500 uIU/mL 0.569   Ammonia     9 - 35 umol/L 38 (H) 35    Assessment: As you may recall, she is a 60 y.o. Caucasian female with PMH of hypertension, diabetes, diabetic gastroparesis, hyperlipidemia, obesity, OSA on CPAP follow up in clinic for episodic left-sided weakness followed by headache. Stroke work up negative except high TG and LDL. EEG negative for seizure. 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 Put on nortriptyline and frequency much decreased.   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 Still has significantly increased muscle tone on the left and weakness too. Currently on ASA and pravastatin. PT/OT stopped. Due to the spasticity, will refer to Dr. Letta Pate.  Plan:  - continue ASA and pravastatin  for stroke prevention - will refer to Dr. Letta Pate for botox injection and left shoulder injection and consider further rehab  - check BP in facility daily - Follow up with your primary care physician for stroke risk factor modification. Recommend maintain blood pressure goal <130/80, diabetes with hemoglobin A1c goal below 7.0% and lipids with LDL cholesterol goal below 70 mg/dL.  -  continue exercise of left UE and LE - follow up in 3 months.   I spent more than 25 minutes of face to face time with the patient. Greater than 50% of time was spent in counseling and coordination of care. We discussed referral to PMR, continue BP management and continue self exercise.   Orders Placed This Encounter  Procedures  . Ambulatory referral to Physical Medicine Rehab    Referral Priority:   Routine    Referral Type:   Rehabilitation    Referral Reason:   Specialty Services Required    Referred to Provider:   Charlett Blake, MD    Requested Specialty:   Physical Medicine and Rehabilitation    Number of Visits Requested:   1    No orders of the defined types were placed in this encounter.   Patient Instructions  - continue ASA and pravastatin for stroke prevention - will refer to Dr. Letta Pate for botox injection and left shoulder injection and consider further rehab  - check BP in facility daily - Follow up with your primary care physician for stroke risk factor modification. Recommend maintain blood pressure goal <130/80, diabetes with hemoglobin A1c goal below 7.0% and lipids with LDL cholesterol goal below 70 mg/dL.  - continue exercise of left UE and LE - follow up in 3 months.    Rosalin Hawking, MD PhD [facility] [address] [place],  [address] [phone]

## 2016-07-09 NOTE — Patient Instructions (Signed)
-   continue ASA and pravastatin for stroke prevention - will refer to Dr. Letta Pate for botox injection and left shoulder injection and consider further rehab  - check BP in facility daily - Follow up with your primary care physician for stroke risk factor modification. Recommend maintain blood pressure goal <130/80, diabetes with hemoglobin A1c goal below 7.0% and lipids with LDL cholesterol goal below 70 mg/dL.  - continue exercise of left UE and LE - follow up in 3 months.

## 2016-07-10 ENCOUNTER — Ambulatory Visit: Payer: Medicare Other | Admitting: Internal Medicine

## 2016-07-21 ENCOUNTER — Encounter: Payer: Medicare Other | Admitting: Cardiology

## 2016-08-13 ENCOUNTER — Encounter: Payer: Self-pay | Admitting: Physical Medicine & Rehabilitation

## 2016-08-13 ENCOUNTER — Ambulatory Visit: Payer: Medicare Other | Admitting: Physical Medicine & Rehabilitation

## 2016-08-13 ENCOUNTER — Encounter: Payer: Medicare Other | Attending: Physical Medicine & Rehabilitation

## 2016-08-13 VITALS — BP 159/95 | HR 93 | Temp 98.1°F | Resp 14

## 2016-08-13 DIAGNOSIS — G8111 Spastic hemiplegia affecting right dominant side: Secondary | ICD-10-CM | POA: Diagnosis not present

## 2016-08-13 DIAGNOSIS — G8114 Spastic hemiplegia affecting left nondominant side: Secondary | ICD-10-CM

## 2016-08-13 DIAGNOSIS — G8110 Spastic hemiplegia affecting unspecified side: Secondary | ICD-10-CM

## 2016-08-13 NOTE — Patient Instructions (Signed)
You received a Botox injection today. You may experience soreness at the needle injection sites. Please call us if any of the injection sites turns red after a couple days or if there is any drainage. You may experience muscle weakness as a result of Botox. This would improve with time but can take several weeks to improve. May use ice 20 minutes every 2 hours. for injection sites The Botox should start working in about one week. The Botox usually last 3 months. The injection can be repeated every 3 months as needed.

## 2016-08-13 NOTE — Progress Notes (Addendum)
Botox Injection for spasticity using needle EMG guidance    Dilution: 50 Units/ml Indication:Left nondominant spastic hemiplegia  which interferes with ADL,mobility and/or  hygiene and is unresponsive to medication management and other conservative care Informed consent was obtained after describing risks and benefits of the procedure with the patient. This includes bleeding, bruising, infection, excessive weakness, or medication side effects. A REMS form is on file and signed. Needle: 27g 1" needle electrode Number of units per muscle Pectoralis100 Biceps50 brachiorad 50 FCR25 FDS25 FDP25 FPL25 VMO 25 VL 25 Rectus Fem 25 Vast int 25 All injections were done after obtaining appropriate EMG activity and after negative drawback for blood. The patient tolerated the procedure well. Post procedure instructions were given. A followup appointment was made.

## 2016-09-17 NOTE — Progress Notes (Signed)
Cardiology Office Note   Date:  09/19/2016   ID:  Destiny Robinson, DOB [birth year], MRN [id number]  PCP:  Tonia Brooms, MD  Cardiologist:   Minus Breeding, MD   Chief Complaint  Patient presents with  . Cardiomyopathy      History of Present Illness: Destiny Robinson is a 60 y.o. female who presents for evaluation of a reduced ejection fraction and chest pain.  I saw her in 2002. She previously had an EF of 45-50%.  The most recent was 60%. She had no significant carotid stenosis.  In 2016 she had a stress perfusion study which was negative. EF was 54%.   She was in the hospital early this eary with chest pain and syncope.  This was felt to be non anginal.  The etiology of the syncope was unremarkable.  She reports that she has episodes of left sharp chest and arm pain only when she is emotionally upset. This happens to her and the staff her from the situation that is upsetting her. She has techniques to calm herself down. This is she does this her symptoms resolved. Of note she did have the same kind of arm pain when she was injected recently with Botox.  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 She is living in a nursing home.  Past Medical History:  Diagnosis Date  . Asthma ~ 1991  . Bronchitis   . 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 Complication of anesthesia 12/11/11   "angry, mean, hateful after" endoscopy & colonoscopy  . Depression   . Diabetes mellitus type 2, controlled ([place])   . GERD (gastroesophageal reflux disease)   . GI bleeding 12/21/11  . Hx of colonoscopy with polypectomy 12/11/11   "took out 7; couldn't get #8, that one was precancerous"  . Hypercholesterolemia   . Hypertension   . Kidney stones   . Pancreatitis   . Paralysis gastric   . Pneumonia 06/2011; 07/2011  . Sleep apnea    uses a cpap-oxygen at night  . Stroke [facility])     Past Surgical History:  Procedure Laterality  Date  . ABDOMINAL HYSTERECTOMY  2002  . APPENDECTOMY  ~ 2002   "w/hysterectomy"  . [place]; 1986  . CHOLECYSTECTOMY  ~ 2002  . COLONOSCOPY  12/11/2011   Procedure: COLONOSCOPY;  Surgeon: Missy Sabins, MD;  Location: [place];  Service: Endoscopy;  Laterality: N/A;  . COLONOSCOPY  04/26/2012   Procedure: COLONOSCOPY;  Surgeon: Wonda Horner, MD;  Location: [facility] ENDOSCOPY;  Service: Endoscopy;  Laterality: N/A;  apc  . ESOPHAGOGASTRODUODENOSCOPY  12/11/2011   Procedure: ESOPHAGOGASTRODUODENOSCOPY (EGD);  Surgeon: Missy Sabins, MD;  Location: [facility] ENDOSCOPY;  Service: Endoscopy;  Laterality: N/A;  . HERNIA REPAIR     Umbilical  . KNEE ARTHROSCOPY WITH MEDIAL MENISECTOMY Right 03/29/2015   Procedure: ARTHROSCOPY KNEE, medial menisectomy, removal of loose body;  Surgeon: Frederik Pear, MD;  Location: [place];  Service: Orthopedics;  Laterality: Right;  RIGHT KNEE ARTHROSCOPY  . OPEN REDUCTION INTERNAL FIXATION (ORIF) DISTAL RADIAL FRACTURE Left 01/07/2015   Procedure: OPEN REDUCTION INTERNAL FIXATION (ORIF) LEFT DISTAL RADIAL FRACTURE;  Surgeon: Milly Jakob, MD;  Location: [place];  Service: Orthopedics;  Laterality: Left;  . TONSILLECTOMY AND ADENOIDECTOMY  1988     Current Outpatient Prescriptions  Medication Sig Dispense Refill  . acetaminophen (  TYLENOL) 500 MG tablet Take 1,000 mg by mouth daily as needed for fever or headache (headache).     . Alum & Mag Hydroxide-Simeth (ANTACID & ANTIGAS) 200-200-20 MG/5ML SUSP Take 15 mLs by mouth every 4 (four) hours as needed for indigestion.    Marland Kitchen amLODipine (NORVASC) 5 MG tablet Take 5 mg by mouth daily. Reported on 01/09/2016    . aspirin EC 81 MG tablet Take 1 tablet (81 mg total) by mouth daily. (Patient taking differently: Take 81 mg by mouth daily. Childrens OTC) 30 tablet 5  . fluticasone (FLONASE) 50 MCG/ACT nasal spray Place 2 sprays into both nostrils daily.    Marland Kitchen gabapentin (NEURONTIN) 600 MG  tablet Take 1,200 mg by mouth 3 (three) times daily.    . hydrALAZINE (APRESOLINE) 50 MG tablet Take 1 tablet (50 mg total) by mouth every 8 (eight) hours. 90 tablet 2  . insulin detemir (LEVEMIR) 100 UNIT/ML injection Inject 18 Units into the skin at bedtime.    Marland Kitchen labetalol (NORMODYNE) 100 MG tablet Take 1 tablet (100 mg total) by mouth 2 (two) times daily. 90 tablet 2  . lisinopril (PRINIVIL,ZESTRIL) 20 MG tablet Take 20 mg by mouth daily.    Marland Kitchen LORazepam (ATIVAN) 0.5 MG tablet Take 0.5 mg by mouth daily.    Marland Kitchen LORazepam (ATIVAN) 1 MG tablet Take 1 mg by mouth 2 (two) times daily.    Marland Kitchen losartan (COZAAR) 100 MG tablet Take 100 mg by mouth daily.    . meclizine (ANTIVERT) 12.5 MG tablet Take 12.5 mg by mouth every 4 (four) hours as needed for dizziness (For 3 doses).    . metFORMIN (GLUCOPHAGE-XR) 500 MG 24 hr tablet Take 500 mg by mouth daily with breakfast.    . Olopatadine HCl (PAZEO) 0.7 % SOLN Apply 1 drop to eye daily.    Marland Kitchen omeprazole (PRILOSEC) 40 MG capsule Take 40 mg by mouth daily.    Marland Kitchen PROAIR HFA 108 (90 BASE) MCG/ACT inhaler USE 2 PUFFS EVERY 6 HOURS AS NEEDED FOR WHEEZING 8.5 g 4  . tizanidine (ZANAFLEX) 2 MG capsule Take 2 mg by mouth 4 (four) times daily as needed for muscle spasms.    . traMADol (ULTRAM) 50 MG tablet Take 50 mg by mouth 3 (three) times daily.    Marland Kitchen venlafaxine XR (EFFEXOR-XR) 150 MG 24 hr capsule Take 150 mg by mouth daily.    . pravastatin (PRAVACHOL) 80 MG tablet Take 1 tablet (80 mg total) by mouth daily. (Patient taking differently: Take 80 mg by mouth at bedtime. ) 30 tablet 2   No current facility-administered medications for this visit.     Allergies:   Aspirin; Codeine; Penicillins; Reglan [metoclopramide]; Xifaxan [rifaximin]; Zocor [simvastatin]; and Crestor [rosuvastatin]    ROS:  Please see the history of present illness.   Otherwise, review of systems are positive for cramping after she has walked quite a way on a given day.   All other systems are  reviewed and negative.    PHYSICAL EXAM: VS:  BP (!) 142/81 (BP Location: Right Arm)   Pulse 89   Ht 5\' 2"  (1.575 m)   Wt 173 lb 3.2 oz (78.6 kg)   BMI 31.68 kg/m  , BMI Body mass index is 31.68 kg/m. GEN:  No distress NECK:  No jugular venous distention at 90 degrees, waveform within normal limits, carotid upstroke brisk and symmetric, no bruits, no thyromegaly LYMPHATICS:  No cervical adenopathy LUNGS:  Clear to auscultation bilaterally BACK:  No CVA tenderness CHEST:  Unremarkable HEART:  S1 and S2 within normal limits, no S3, no S4, no clicks, no rubs, no murmurs ABD:  Positive bowel sounds normal in frequency in pitch, no bruits, no rebound, no guarding, unable to assess midline mass or bruit with the patient seated. EXT:  2 plus pulses throughout, moderate edema, no cyanosis no clubbing SKIN:  No rashes no nodules NEURO:  Cranial nerves II through XII grossly intact, motor grossly intact throughout PSYCH:  Cognitively intact, oriented to person place and time   EKG:  EKG is ordered today. Sinus rhythm, rate 89, axis within normal limits, intervals within normal limits, no acute ST-T wave changes area   Recent Labs: 10/31/2015: Magnesium 2.0 11/01/2015: ALT 16; BUN 10; Creatinine, Ser 0.51; Potassium 3.7; Sodium 142 11/02/2015: Hemoglobin 14.4; Platelets 152    Wt Readings from Last 3 Encounters:  09/18/16 173 lb 3.2 oz (78.6 kg)  11/02/15 168 lb 14 oz (76.6 kg)  08/10/15 186 lb 4.6 oz (84.5 kg)      Other studies Reviewed: Additional studies/ records that were reviewed today include:   Hospital records.    ASSESSMENT AND PLAN:  CHEST PAIN:  Her pain was atypical. She had a negative stress test. No further cardiovascular testing is suggested.  This certainly seems to be a stable pattern related to stress/panic attacks.  She has a good method of dealing with this.  There are no unstable symptoms.   No further testing is indicated.   SYNCOPE:  She has had no further  episodes.  No further work up is planned  HISTORY OF CVA:   She is limited by this.  No change in therapy.   HTN:  The blood pressure is at target. No change in medications is indicated. We will continue with therapeutic lifestyle changes (TLC).  DYSLIPIDEMIA:  I will defer follow-up to [facility], MD  DM:    Her A1C was 5.6.  She will continue on meds as listed.    Current medicines are reviewed at length with the patient today.  The patient does not have concerns regarding medicines.  The following changes have been made:  no change  Labs/ tests ordered today include: See above.  No   Disposition:   FU with me in 12  months   Signed, Minus Breeding, MD  09/19/2016 8:46 AM    [place]

## 2016-09-18 ENCOUNTER — Ambulatory Visit (INDEPENDENT_AMBULATORY_CARE_PROVIDER_SITE_OTHER): Payer: Medicare Other | Admitting: Cardiology

## 2016-09-18 ENCOUNTER — Encounter: Payer: Self-pay | Admitting: Cardiology

## 2016-09-18 VITALS — BP 142/81 | HR 89 | Ht 62.0 in | Wt 173.2 lb

## 2016-09-18 DIAGNOSIS — I429 Cardiomyopathy, unspecified: Secondary | ICD-10-CM | POA: Diagnosis not present

## 2016-09-18 DIAGNOSIS — I10 Essential (primary) hypertension: Secondary | ICD-10-CM

## 2016-09-18 DIAGNOSIS — I6330 Cerebral infarction due to thrombosis of unspecified cerebral artery: Secondary | ICD-10-CM

## 2016-09-18 NOTE — Patient Instructions (Signed)

## 2016-09-19 ENCOUNTER — Encounter: Payer: Self-pay | Admitting: Cardiology

## 2016-09-25 ENCOUNTER — Ambulatory Visit (HOSPITAL_BASED_OUTPATIENT_CLINIC_OR_DEPARTMENT_OTHER): Payer: Medicare Other | Admitting: Physical Medicine & Rehabilitation

## 2016-09-25 ENCOUNTER — Encounter: Payer: Self-pay | Admitting: Physical Medicine & Rehabilitation

## 2016-09-25 VITALS — BP 151/86 | HR 85 | Resp 16

## 2016-09-25 DIAGNOSIS — I6330 Cerebral infarction due to thrombosis of unspecified cerebral artery: Secondary | ICD-10-CM | POA: Diagnosis not present

## 2016-09-25 DIAGNOSIS — G8111 Spastic hemiplegia affecting right dominant side: Secondary | ICD-10-CM

## 2016-09-25 DIAGNOSIS — G8110 Spastic hemiplegia affecting unspecified side: Secondary | ICD-10-CM

## 2016-09-25 DIAGNOSIS — I619 Nontraumatic intracerebral hemorrhage, unspecified: Secondary | ICD-10-CM | POA: Diagnosis not present

## 2016-09-25 DIAGNOSIS — I618 Other nontraumatic intracerebral hemorrhage: Secondary | ICD-10-CM | POA: Diagnosis not present

## 2016-09-25 NOTE — Progress Notes (Signed)
Subjective:    Patient ID: Destiny Robinson, female    DOB: [birth year], 58 y.o.   MRN: [id number]  HPI [facility] since her CVA which has caused a severe left spastic hemiplegia proximally one year ago. She was referred by neurology for botulinum toxin injections     Follow up from 11/9 botox injection Pectoralis100 Biceps50 brachiorad 50 FCR25 FDS25 FDP25 FPL25 VMO 25 VL 25 Rectus Fem 25 Vast int 25  Patient states that she has not noted any significant improvement in her spasticity on the left side in either her upper or her lower extremity. She is complaining of pain with range of motion of the lower extremity. According to one of her caregivers. Both flexion and extension bother her. She has extremely limited mobility and uses a lift to transfer. She is wheelchair bound Pain Inventory Average Pain 0 Pain Right Now 0 My pain is no pain  In the last 24 hours, has pain interfered with the following? General activity 0 Relation with others 0 Enjoyment of life no pain What TIME of day is your pain at its worst? no pain Sleep (in general) NA  Pain is worse with: bending Pain improves with: therapy/exercise Relief from Meds: no pain  Mobility ability to climb steps?  no do you drive?  no use a wheelchair needs help with transfers  Function disabled: date disabled . retired  Neuro/Psych No problems in this area  Prior Studies Any changes since last visit?  no  Physicians involved in your care Any changes since last visit?  no   Family History  Problem Relation Age of Onset  . Alzheimer's disease Mother   . Stroke Mother   . Heart disease Father     Enlarged heart  . Colon cancer Sister    Social History   Social History  . Marital status: Widowed    Spouse name: N/A  . Number of children: 2  . Years of education: 34   Social History Main Topics  . Smoking status: Former Smoker    Packs/day: 0.12     Years: 6.00    Types: Cigarettes    Quit date: 12/09/1996  . Smokeless tobacco: Never Used  . Alcohol use No  . Drug use: No  . Sexual activity: No     Comment: complete hysterectomy   Other Topics Concern  . None   Social History Narrative   Patient is widowed with 2 children.   Patient is right handed.   Patient has hs education.   Patient drinks 36 oz daily.   Past Surgical History:  Procedure Laterality Date  . ABDOMINAL HYSTERECTOMY  2002  . APPENDECTOMY  ~ 2002   "w/hysterectomy"  . [place]; 1986  . CHOLECYSTECTOMY  ~ 2002  . COLONOSCOPY  12/11/2011   Procedure: COLONOSCOPY;  Surgeon: Missy Sabins, MD;  Location: [place];  Service: Endoscopy;  Laterality: N/A;  . COLONOSCOPY  04/26/2012   Procedure: COLONOSCOPY;  Surgeon: Wonda Horner, MD;  [facility];  Service: Endoscopy;  Laterality: N/A;  apc  . ESOPHAGOGASTRODUODENOSCOPY  12/11/2011   Procedure: ESOPHAGOGASTRODUODENOSCOPY (EGD);  Surgeon: Missy Sabins, MD;  Location: [facility] ENDOSCOPY;  Service: Endoscopy;  Laterality: N/A;  . HERNIA REPAIR     Umbilical  . KNEE ARTHROSCOPY WITH MEDIAL MENISECTOMY Right 03/29/2015   Procedure: ARTHROSCOPY KNEE, medial menisectomy, removal of loose body;  Surgeon: Frederik Pear, MD;  Location: [facility]  [place];  Service: Orthopedics;  Laterality: Right;  RIGHT KNEE ARTHROSCOPY  . OPEN REDUCTION INTERNAL FIXATION (ORIF) DISTAL RADIAL FRACTURE Left 01/07/2015   Procedure: OPEN REDUCTION INTERNAL FIXATION (ORIF) LEFT DISTAL RADIAL FRACTURE;  Surgeon: Milly Jakob, MD;  Location: [place];  Service: Orthopedics;  Laterality: Left;  . TONSILLECTOMY AND ADENOIDECTOMY  1988   Past Medical History:  Diagnosis Date  . Asthma ~ 1991  . Bronchitis   . 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 Complication of anesthesia 12/11/11   "angry, mean, hateful after" endoscopy & colonoscopy  . Depression   . Diabetes  mellitus type 2, controlled ([place])   . GERD (gastroesophageal reflux disease)   . GI bleeding 12/21/11  . Hx of colonoscopy with polypectomy 12/11/11   "took out 7; couldn't get #8, that one was precancerous"  . Hypercholesterolemia   . Hypertension   . Kidney stones   . Pancreatitis   . Paralysis gastric   . Pneumonia 06/2011; 07/2011  . Sleep apnea    uses a cpap-oxygen at night  . Stroke ([place])    BP (!) 151/86   Pulse 85   Resp 16   SpO2 92%   Opioid Risk Score:   Fall Risk Score:  `1  Depression screen PHQ 2/9  No flowsheet data found.   Review of Systems  Constitutional: Negative.   HENT: Negative.   Eyes: Negative.   Respiratory: Negative.   Cardiovascular: Negative.   Gastrointestinal: Negative.   Endocrine: Negative.   Genitourinary: Negative.   Musculoskeletal: Negative.   Skin: Negative.   Allergic/Immunologic: Negative.   Neurological: Negative.   Hematological: Negative.   Psychiatric/Behavioral: Negative.   All other systems reviewed and are negative.      Objective:   Physical Exam  Constitutional: She is oriented to person, place, and time. She appears well-developed and well-nourished.  HENT:  Head: Normocephalic and atraumatic.  Eyes: Conjunctivae and EOM are normal. Pupils are equal, round, and reactive to light.  Neck: Normal range of motion. Neck supple.  Neurological: She is alert and oriented to person, place, and time.  [place] for spasticity in the finger flexors, 3 at the wrist flexor, 1 at the elbow flexors, 3 at the shoulder adductor's 3 at the knee extensors, 3 at the knee flexors. Motor strength is essentially 0 in the left upper extremity, upper and lower  Patient does have pain with lower extremity range of motion with both knee flexion and extension. No evidence of clonus at the ankle.  Skin: Skin is warm and dry.  Nursing note and vitals reviewed.         Assessment & Plan:  1. 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 I suspect that she has contractures at the finger flexors and perhaps the shoulder adductor's. We discussed that additional Botox may not be helpful in that situation. I do think her lower extremity tone may be helped by higher dose including injection of the hamstrings. Patient states that she does get some pain relief from Zanaflex, but this is on a when necessary basis and she does not always request. We'll schedule Zanaflex 2-4 mg daily at bedtime At this point she would like to hold off on further Botox injections. We did discuss Dysport as an alternative, which may allow higher dosing. Patient will follow-up on an as-needed basis

## 2016-09-25 NOTE — Patient Instructions (Signed)
If you decide to have another Botox injection, we could actually use a competitor called Dysport. We can change which muscles are injected as well. Please call to schedule

## 2016-09-27 ENCOUNTER — Inpatient Hospital Stay (HOSPITAL_COMMUNITY)
Admission: EM | Admit: 2016-09-27 | Discharge: 2016-10-05 | DRG: 065 | Disposition: E | Payer: Medicare Other | Attending: Family Medicine | Admitting: Family Medicine

## 2016-09-27 ENCOUNTER — Emergency Department (HOSPITAL_COMMUNITY): Payer: Medicare Other

## 2016-09-27 ENCOUNTER — Encounter (HOSPITAL_COMMUNITY): Payer: Self-pay | Admitting: Emergency Medicine

## 2016-09-27 DIAGNOSIS — G4089 Other seizures: Secondary | ICD-10-CM | POA: Diagnosis present

## 2016-09-27 DIAGNOSIS — I618 Other nontraumatic intracerebral hemorrhage: Secondary | ICD-10-CM | POA: Diagnosis present

## 2016-09-27 DIAGNOSIS — Z823 Family history of stroke: Secondary | ICD-10-CM | POA: Diagnosis not present

## 2016-09-27 DIAGNOSIS — I619 Nontraumatic intracerebral hemorrhage, unspecified: Secondary | ICD-10-CM | POA: Diagnosis present

## 2016-09-27 DIAGNOSIS — I629 Nontraumatic intracranial hemorrhage, unspecified: Secondary | ICD-10-CM | POA: Diagnosis not present

## 2016-09-27 DIAGNOSIS — Z820 Family history of epilepsy and other diseases of the nervous system: Secondary | ICD-10-CM

## 2016-09-27 DIAGNOSIS — Z800 Family history of malignant neoplasm of digestive organs: Secondary | ICD-10-CM

## 2016-09-27 DIAGNOSIS — R402432 Glasgow coma scale score 3-8, at arrival to emergency department: Secondary | ICD-10-CM | POA: Diagnosis present

## 2016-09-27 DIAGNOSIS — G919 Hydrocephalus, unspecified: Secondary | ICD-10-CM | POA: Diagnosis present

## 2016-09-27 DIAGNOSIS — K219 Gastro-esophageal reflux disease without esophagitis: Secondary | ICD-10-CM | POA: Diagnosis present

## 2016-09-27 DIAGNOSIS — G8929 Other chronic pain: Secondary | ICD-10-CM | POA: Diagnosis present

## 2016-09-27 DIAGNOSIS — G4730 Sleep apnea, unspecified: Secondary | ICD-10-CM | POA: Diagnosis present

## 2016-09-27 DIAGNOSIS — Z515 Encounter for palliative care: Secondary | ICD-10-CM | POA: Diagnosis not present

## 2016-09-27 DIAGNOSIS — E7800 Pure hypercholesterolemia, unspecified: Secondary | ICD-10-CM | POA: Diagnosis present

## 2016-09-27 DIAGNOSIS — F329 Major depressive disorder, single episode, unspecified: Secondary | ICD-10-CM | POA: Diagnosis present

## 2016-09-27 DIAGNOSIS — E119 Type 2 diabetes mellitus without complications: Secondary | ICD-10-CM | POA: Diagnosis present

## 2016-09-27 DIAGNOSIS — I615 Nontraumatic intracerebral hemorrhage, intraventricular: Secondary | ICD-10-CM | POA: Diagnosis present

## 2016-09-27 DIAGNOSIS — I69254 Hemiplegia and hemiparesis following other nontraumatic intracranial hemorrhage affecting left non-dominant side: Secondary | ICD-10-CM

## 2016-09-27 DIAGNOSIS — Z87442 Personal history of urinary calculi: Secondary | ICD-10-CM

## 2016-09-27 DIAGNOSIS — I10 Essential (primary) hypertension: Secondary | ICD-10-CM | POA: Diagnosis present

## 2016-09-27 DIAGNOSIS — E669 Obesity, unspecified: Secondary | ICD-10-CM | POA: Diagnosis present

## 2016-09-27 DIAGNOSIS — Z66 Do not resuscitate: Secondary | ICD-10-CM | POA: Diagnosis present

## 2016-09-27 DIAGNOSIS — E785 Hyperlipidemia, unspecified: Secondary | ICD-10-CM | POA: Diagnosis present

## 2016-09-27 DIAGNOSIS — Z87891 Personal history of nicotine dependence: Secondary | ICD-10-CM

## 2016-09-27 DIAGNOSIS — Z8249 Family history of ischemic heart disease and other diseases of the circulatory system: Secondary | ICD-10-CM | POA: Diagnosis not present

## 2016-09-27 LAB — I-STAT ARTERIAL BLOOD GAS, ED
ACID-BASE EXCESS: 3 mmol/L — AB (ref 0.0–2.0)
BICARBONATE: 28.4 mmol/L — AB (ref 20.0–28.0)
O2 Saturation: 95 %
PCO2 ART: 45.2 mmHg (ref 32.0–48.0)
PH ART: 7.406 (ref 7.350–7.450)
PO2 ART: 79 mmHg — AB (ref 83.0–108.0)
Patient temperature: 98.6
TCO2: 30 mmol/L (ref 0–100)

## 2016-09-27 LAB — CBC
HCT: 46.8 % — ABNORMAL HIGH (ref 36.0–46.0)
HEMOGLOBIN: 15.4 g/dL — AB (ref 12.0–15.0)
MCH: 28.4 pg (ref 26.0–34.0)
MCHC: 32.9 g/dL (ref 30.0–36.0)
MCV: 86.2 fL (ref 78.0–100.0)
PLATELETS: 146 10*3/uL — AB (ref 150–400)
RBC: 5.43 MIL/uL — AB (ref 3.87–5.11)
RDW: 13.8 % (ref 11.5–15.5)
WBC: 7.3 10*3/uL (ref 4.0–10.5)

## 2016-09-27 LAB — I-STAT TROPONIN, ED: Troponin i, poc: 0 ng/mL (ref 0.00–0.08)

## 2016-09-27 LAB — DIFFERENTIAL
BASOS ABS: 0 10*3/uL (ref 0.0–0.1)
Basophils Relative: 0 %
EOS ABS: 0.1 10*3/uL (ref 0.0–0.7)
EOS PCT: 1 %
LYMPHS ABS: 1.3 10*3/uL (ref 0.7–4.0)
LYMPHS PCT: 18 %
Monocytes Absolute: 0.4 10*3/uL (ref 0.1–1.0)
Monocytes Relative: 5 %
NEUTROS PCT: 76 %
Neutro Abs: 5.6 10*3/uL (ref 1.7–7.7)

## 2016-09-27 LAB — COMPREHENSIVE METABOLIC PANEL
ALBUMIN: 4.4 g/dL (ref 3.5–5.0)
ALT: 25 U/L (ref 14–54)
ANION GAP: 9 (ref 5–15)
AST: 33 U/L (ref 15–41)
Alkaline Phosphatase: 72 U/L (ref 38–126)
BILIRUBIN TOTAL: 0.4 mg/dL (ref 0.3–1.2)
BUN: 9 mg/dL (ref 6–20)
CO2: 28 mmol/L (ref 22–32)
Calcium: 10 mg/dL (ref 8.9–10.3)
Chloride: 104 mmol/L (ref 101–111)
Creatinine, Ser: 0.5 mg/dL (ref 0.44–1.00)
GFR calc Af Amer: >60 mL/min (ref >60)
GFR calc non Af Amer: >60 mL/min (ref >60)
GLUCOSE: 182 mg/dL — AB (ref 65–99)
POTASSIUM: 4 mmol/L (ref 3.5–5.1)
SODIUM: 141 mmol/L (ref 135–145)
TOTAL PROTEIN: 8.1 g/dL (ref 6.5–8.1)

## 2016-09-27 LAB — PROTIME-INR
INR: 0.86
PROTHROMBIN TIME: 11.7 s (ref 11.4–15.2)

## 2016-09-27 LAB — CBG MONITORING, ED: GLUCOSE-CAPILLARY: 173 mg/dL — AB (ref 65–99)

## 2016-09-27 LAB — APTT: APTT: 27 s (ref 24–36)

## 2016-09-27 LAB — ETHANOL: Alcohol, Ethyl (B): <5 mg/dL (ref <5)

## 2016-09-27 MED ORDER — ACETAMINOPHEN 325 MG PO TABS: 650.0000 mg | ORAL_TABLET | Freq: Four times a day (QID) | ORAL | Status: DC | PRN

## 2016-09-27 MED ORDER — ONDANSETRON HCL 4 MG/2ML IJ SOLN
4.0000 mg | Freq: Once | INTRAMUSCULAR | Status: AC
  Administered 2016-09-27: 4 mg via INTRAVENOUS
  Filled 2016-09-27: qty 2

## 2016-09-27 MED ORDER — LORAZEPAM 2 MG/ML IJ SOLN
1.0000 mg | Freq: Once | INTRAMUSCULAR | Status: AC
  Administered 2016-09-27: 1 mg via INTRAVENOUS
  Filled 2016-09-27: qty 1

## 2016-09-27 MED ORDER — LABETALOL HCL 5 MG/ML IV SOLN
20.0000 mg | Freq: Once | INTRAVENOUS | Status: AC
  Administered 2016-09-27: 20 mg via INTRAVENOUS
  Filled 2016-09-27: qty 4

## 2016-09-27 MED ORDER — MORPHINE SULFATE (PF) 4 MG/ML IV SOLN
1.0000 mg | INTRAVENOUS | Status: DC | PRN
  Administered 2016-09-27: 1 mg via INTRAVENOUS
  Filled 2016-09-27: qty 1

## 2016-09-27 MED ORDER — GLYCOPYRROLATE 1 MG PO TABS
1.0000 mg | ORAL_TABLET | ORAL | Status: DC | PRN
  Filled 2016-09-27: qty 1

## 2016-09-27 MED ORDER — LORAZEPAM 2 MG/ML IJ SOLN: 1.0000 mg | INTRAMUSCULAR | Status: DC | PRN

## 2016-09-27 MED ORDER — ACETAMINOPHEN 650 MG RE SUPP: 650.0000 mg | Freq: Four times a day (QID) | RECTAL | Status: DC | PRN

## 2016-09-27 MED ORDER — SODIUM CHLORIDE 0.9 % IV SOLN
12.5000 mg | Freq: Four times a day (QID) | INTRAVENOUS | Status: DC | PRN
  Filled 2016-09-27: qty 0.5

## 2016-09-27 MED ORDER — GLYCOPYRROLATE 0.2 MG/ML IJ SOLN: 0.2000 mg | INTRAMUSCULAR | Status: DC | PRN

## 2016-09-27 MED ORDER — GLYCOPYRROLATE 0.2 MG/ML IJ SOLN
0.2000 mg | INTRAMUSCULAR | Status: DC | PRN
  Administered 2016-09-27: 0.2 mg via INTRAVENOUS
  Filled 2016-09-27: qty 1

## 2016-09-29 ENCOUNTER — Telehealth: Payer: Self-pay | Admitting: Cardiology

## 2016-09-29 NOTE — Telephone Encounter (Signed)
Follow Up   Family wanted to let Dr. Percival Spanish know that patient passed away.

## 2016-09-29 NOTE — Telephone Encounter (Signed)
FYI

## 2016-10-05 NOTE — Progress Notes (Signed)
    1600  Clinical Encounter Type  Visited With Family;Health care provider  Visit Type Death  Referral From Nurse  Consult/Referral To Chaplain  Spiritual Encounters  Spiritual Needs Prayer;Ritual;Emotional;Grief support  Stress Factors  Patient Stress Factors Not reviewed  Family Stress Factors Loss    Chaplain responded to page to 6N for death. Family present, prayed offered ministry of presence.

## 2016-10-05 NOTE — Progress Notes (Signed)
Patient was admitted at 62. Patient given 1 mg of morphine for dyspnea. Family at the bedside. Patient passed 1530. Dr. Aggie Moats notified

## 2016-10-05 NOTE — Progress Notes (Signed)
Spoke with nurse, Steffanie Dunn via telephone for an update on the patient. Per Steffanie Dunn, patient was gasping for breath-morphine just given. This NP urgently went to bedside. Expired upon arrival to room. No pulse felt or heart/lung sounds upon ascultation. Pronounced 3:30pm. Offered emotional and spiritual support to family at bedside.   NO CHARGE  Ihor Dow, FNP-C Palliative Medicine Team  Phone: [phone] Fax: [phone]

## 2016-10-05 NOTE — ED Notes (Signed)
Unsuccessful attempt at giving report to 6N. 

## 2016-10-05 NOTE — ED Notes (Signed)
Chaplain paged per patient's family request

## 2016-10-05 NOTE — ED Triage Notes (Signed)
Received pt from [facility] with c/o LSN at 0930. Pt was found at 0945 staring. Pt given 5 of midazolam by EMS with no change. Pt presents with full body stiffness and snoring respirations.

## 2016-10-05 NOTE — H&P (Signed)
History and Physical    Destiny Robinson [id number] DOB: July 26, 1956 DOA: 10-05-2016   PCP: Tonia Brooms, MD /UNASSIGNED  Patient coming from/Res[facility] with[facility]  Admission status: Inpatient/Palliaitve-medically necessary to stay a minimum 2 midnights to rule out impending and/or unexpected changes in physiologic status that may differ from initial evaluation performed in the ER and/or at time of admission. Patient presents with significant intracranial hemorrhage complicated by seizure with grave prognosis. Family has opted for comfort measures. Anticipate hospital death.  Chief Complaint: Altered mental status/new onset seizures secondary to intracranial hemorrhage  HPI: Destiny Robinson is a 61 y.o. female with medical history significant for prior intracranial hemorrhage/CVA with resultant left hemiparesis residing in nursing facility,  hypertension, diabetes, obesity, dyslipidemia, depression and reflux. She was also being treated by Dr. Bonney Aid for pain related to severe left spastic hemiplegia and had received a Botox injection on 08/13/16. EMS was called to nursing facility when patient was completely unresponsive with full body stiffness and snoring respirations documented. Last seen normal 9:30 AM was found at 9:45 AM as described above. Was given 5 of Versed by EMS due to concerns for seizure activity with no change in status. Patient remained unresponsive upon arrival to ER. Stat CT of the head revealed large acute left thalamic hemorrhage with intraventricular extension and hydrocephalus with attended 15 mm rightward midline shift. Results were communicated to the ER physician as well to the stroke neurologist on call Dr. Leonel Ramsay. Patient with grave prognosis. This communicated to the family by both the EDP and the neurologist and plan is to begin comfort measures.  ED Course:  Vital Signs: BP 165/83   Pulse 84   Resp 17   Ht 5\' 2"  (1.575 m)   Wt 78.5 kg  (173 lb)   SpO2 (!) 89%   BMI 31.64 kg/m  CT Head without contrast: Large acute left thalamic hemorrhage with intraventricular extension and hydrocephalus. There is also attended 15 mm rightward midline shift Lab data: Sodium 141, potassium 4.0, chloride 104, BUN 9, creatinine 0.5, glucose 182, calcium 10, LFTs normal, poc troponin 0.00, WBC 7300 with neutrophils 76% and absolute neutrophils 5.6%, hemoglobin 15.4, platelets 146,000, coags normal Medications and treatments: Ativan 1 mg IV 1, Zofran 4 mg IV 1  Review of Systems:  In addition to the HPI above,  **Unable to obtain from patient due to unresponsive state   Past Medical History:  Diagnosis Date  . Asthma ~ 1991  . Bronchitis   . 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 Complication of anesthesia 12/11/11   "angry, mean, hateful after" endoscopy & colonoscopy  . Depression   . Diabetes mellitus type 2, controlled ([place])   . GERD (gastroesophageal reflux disease)   . GI bleeding 12/21/11  . Hx of colonoscopy with polypectomy 12/11/11   "took out 7; couldn't get #8, that one was precancerous"  . Hypercholesterolemia   . Hypertension   . Kidney stones   . Pancreatitis   . Paralysis gastric   . Pneumonia 06/2011; 07/2011  . Sleep apnea    uses a cpap-oxygen at night  . Stroke [facility])     Past Surgical History:  Procedure Laterality Date  . ABDOMINAL HYSTERECTOMY  2002  . APPENDECTOMY  ~ 2002   "w/hysterectomy"  . [place]; 1986  . CHOLECYSTECTOMY  ~ 2002  . COLONOSCOPY  12/11/2011   Procedure: COLONOSCOPY;  Surgeon: Elyse Jarvis  Amedeo Plenty, MD;  Location: [place];  Service: Endoscopy;  Laterality: N/A;  . COLONOSCOPY  04/26/2012   Procedure: COLONOSCOPY;  Surgeon: Wonda Horner, MD;  Location: [facility] ENDOSCOPY;  Service: Endoscopy;  Laterality: N/A;  apc  . ESOPHAGOGASTRODUODENOSCOPY  12/11/2011   Procedure: ESOPHAGOGASTRODUODENOSCOPY (EGD);  Surgeon: Missy Sabins, MD;  Location[facility]  ENDOSCOPY;  Service: Endoscopy;  Laterality: N/A;  . HERNIA REPAIR     Umbilical  . KNEE ARTHROSCOPY WITH MEDIAL MENISECTOMY Right 03/29/2015   Procedure: ARTHROSCOPY KNEE, medial menisectomy, removal of loose body;  Surgeon: Frederik Pear, MD;  Location: [place];  Service: Orthopedics;  Laterality: Right;  RIGHT KNEE ARTHROSCOPY  . OPEN REDUCTION INTERNAL FIXATION (ORIF) DISTAL RADIAL FRACTURE Left 01/07/2015   Procedure: OPEN REDUCTION INTERNAL FIXATION (ORIF) LEFT DISTAL RADIAL FRACTURE;  Surgeon: Milly Jakob, MD;  Location: [place];  Service: Orthopedics;  Laterality: Left;  . TONSILLECTOMY AND ADENOIDECTOMY  1988    Social History   Social History  . Marital status: Widowed    Spouse name: N/A  . Number of children: 2  . Years of education: 12   Occupational History  . Not on file.   Social History Main Topics  . Smoking status: Former Smoker    Packs/day: 0.12    Years: 6.00    Types: Cigarettes    Quit date: 12/09/1996  . Smokeless tobacco: Never Used  . Alcohol use No  . Drug use: No  . Sexual activity: No     Comment: complete hysterectomy   Other Topics Concern  . Not on file   Social History Narrative   Patient is widowed with 2 children.   Patient is right handed.   Patient has hs education.   Patient drinks 36 oz daily.    Mobility: Not applicable Work history: Disabled   Allergies  Allergen Reactions  . Aspirin Other (See Comments)    Bleeding events with 325mg  dose, unsure about 81mg ?  . Codeine Hypertension  . Penicillins Anaphylaxis    Has patient had a PCN reaction causing immediate rash, facial/tongue/throat swelling, SOB or lightheadedness with hypotension: Yes Has patient had a PCN reaction causing severe rash involving mucus membranes or skin necrosis: No Has patient had a PCN reaction that required hospitalization Yes Has patient had a PCN reaction occurring within the last 10 years: No If all of the above  answers are "NO", then may proceed with Cephalosporin use.   . Reglan [Metoclopramide] Swelling and Rash    5MG  MAKES THROAT SWELL  . Xifaxan [Rifaximin] Hives, Shortness Of Breath and Swelling    550MG  MAKES THROAT SWELL and SHORTNESS OF BREATH  . Zocor [Simvastatin] Anaphylaxis and Hives  . Crestor [Rosuvastatin] Other (See Comments)    Cramping and Muscles ache    Family History  Problem Relation Age of Onset  . Alzheimer's disease Mother   . Stroke Mother   . Heart disease Father     Enlarged heart  . Colon cancer Sister      Prior to Admission medications   Medication Sig Start Date End Date Taking? Authorizing Provider  aspirin EC 81 MG tablet Take 1 tablet (81 mg total) by mouth daily. Patient taking differently: Take 81 mg by mouth daily. Childrens OTC 10/29/15  Yes Dennie Bible, NP  fluticasone [facility]) 50 MCG/ACT nasal spray Place 2 sprays into both nostrils daily.   Yes Historical Provider, MD  gabapentin (NEURONTIN) 600 MG tablet Take 1,200 mg by  mouth 3 (three) times daily.   Yes Historical Provider, MD  hydrALAZINE (APRESOLINE) 50 MG tablet Take 1 tablet (50 mg total) by mouth every 8 (eight) hours. 08/07/15  Yes Donzetta Starch, NP  insulin detemir (LEVEMIR) 100 UNIT/ML injection Inject 18 Units into the skin at bedtime. 08/07/15  Yes Historical Provider, MD  labetalol (NORMODYNE) 100 MG tablet Take 1 tablet (100 mg total) by mouth 2 (two) times daily. 08/10/15  Yes Thurnell Lose, MD  lisinopril (PRINIVIL,ZESTRIL) 20 MG tablet Take 20 mg by mouth daily. 09/16/16  Yes Historical Provider, MD  LORazepam (ATIVAN) 0.5 MG tablet Take 0.5 mg by mouth daily.   Yes Historical Provider, MD  LORazepam (ATIVAN) 1 MG tablet Take 1 mg by mouth 2 (two) times daily.   Yes Historical Provider, MD  losartan (COZAAR) 100 MG tablet Take 100 mg by mouth daily.   Yes Historical Provider, MD  metFORMIN (GLUCOPHAGE-XR) 500 MG 24 hr tablet Take 500 mg by mouth daily with breakfast.    Yes Historical Provider, MD  Olopatadine HCl (PAZEO) 0.7 % SOLN Apply 1 drop to eye daily.   Yes Historical Provider, MD  omeprazole (PRILOSEC) 40 MG capsule Take 40 mg by mouth daily. 09/03/16  Yes Historical Provider, MD  tizanidine (ZANAFLEX) 2 MG capsule Take 2 mg by mouth 4 (four) times daily as needed for muscle spasms.   Yes Historical Provider, MD  traMADol (ULTRAM) 50 MG tablet Take 50 mg by mouth 3 (three) times daily.   Yes Historical Provider, MD  venlafaxine XR (EFFEXOR-XR) 150 MG 24 hr capsule Take 150 mg by mouth daily.   Yes Historical Provider, MD  acetaminophen (TYLENOL) 500 MG tablet Take 1,000 mg by mouth daily as needed for fever or headache (headache).     Historical Provider, MD  Alum & Mag Hydroxide-Simeth (ANTACID & ANTIGAS) [id number] MG/5ML SUSP Take 15 mLs by mouth every 4 (four) hours as needed for indigestion.    Historical Provider, MD  amLODipine (NORVASC) 5 MG tablet Take 5 mg by mouth daily. Reported on 01/09/2016    Historical Provider, MD  meclizine (ANTIVERT) 12.5 MG tablet Take 12.5 mg by mouth every 4 (four) hours as needed for dizziness (For 3 doses).    Historical Provider, MD  pravastatin (PRAVACHOL) 80 MG tablet Take 1 tablet (80 mg total) by mouth daily. Patient taking differently: Take 80 mg by mouth at bedtime.  08/07/15 08/06/16  Donzetta Starch, NP  PROAIR HFA 108 (90 BASE) MCG/ACT inhaler USE 2 PUFFS EVERY 6 HOURS AS NEEDED FOR WHEEZING    Deneise Lever, MD    Physical Exam: Vitals:   10-17-2016 1057 10-17-16 1100 10-17-16 1115 17-Oct-2016 1130  BP:  165/83 190/98 165/83  Pulse:  83 90 84  Resp:  19 22 17   SpO2: 98% 96% 93% (!) 89%  Weight:      Height:          Constitutional: In acute distress as evidenced by neurogenic breathing pattern and unresponsive state Eyes: Pupils are 3 mm and minimally reactive to light, sclerae are injected bilaterally ENMT: Mucous membranes are moist. Posterior pharynx clear of any exudate or lesions.Normal  dentition.  Neck: normal, supple, no masses, no thyromegaly Respiratory: Bilateral lung sounds are coarse to auscultation with snoring respiratory effort as well as blowing respiratory effort, nasal cannula oxygen in place Cardiovascular: Regular rate and sinus rhythm, no murmurs / rubs / gallops. No extremity edema. 2+ pedal pulses. No carotid bruits.  Hypertensive with current blood pressure 192/83 Abdomen: no tenderness, no masses palpated. No hepatosplenomegaly. Bowel sounds absent.  Musculoskeletal: no clubbing / cyanosis. No joint deformity upper and lower extremities. Good ROM, no contractures. Normal muscle tone.  Skin: no rashes, lesions, ulcers. No induration Neurologic: Unresponsive to verbal stimulation, no blink reflex, extensive posturing to noxious stimulation Psychiatric: Unresponsive    Labs on Admission: I have personally reviewed following labs and imaging studies  CBC:  Recent Labs Lab 25-Oct-2016 1120  WBC 7.3  NEUTROABS 5.6  HGB 15.4*  HCT 46.8*  MCV 86.2  PLT 123456*   Basic Metabolic Panel:  Recent Labs Lab 2016/10/25 1120  NA 141  K 4.0  CL 104  CO2 28  GLUCOSE 182*  BUN 9  CREATININE 0.50  CALCIUM 10.0   GFR: Estimated Creatinine Clearance: 74.4 mL/min (by C-G formula based on SCr of 0.5 mg/dL). Liver Function Tests:  Recent Labs Lab 10-25-16 1120  AST 33  ALT 25  ALKPHOS 72  BILITOT 0.4  PROT 8.1  ALBUMIN 4.4   No results for input(s): LIPASE, AMYLASE in the last 168 hours. No results for input(s): AMMONIA in the last 168 hours. Coagulation Profile:  Recent Labs Lab 25-Oct-2016 1120  INR 0.86   Cardiac Enzymes: No results for input(s): CKTOTAL, CKMB, CKMBINDEX, TROPONINI in the last 168 hours. BNP (last 3 results) No results for input(s): PROBNP in the last 8760 hours. HbA1C: No results for input(s): HGBA1C in the last 72 hours. CBG:  Recent Labs Lab 10-25-2016 1123  GLUCAP 173*   Lipid Profile: No results for input(s): CHOL,  HDL, LDLCALC, TRIG, CHOLHDL, LDLDIRECT in the last 72 hours. Thyroid Function Tests: No results for input(s): TSH, T4TOTAL, FREET4, T3FREE, THYROIDAB in the last 72 hours. Anemia Panel: No results for input(s): VITAMINB12, FOLATE, FERRITIN, TIBC, IRON, RETICCTPCT in the last 72 hours. Urine analysis:    Component Value Date/Time   COLORURINE YELLOW 10/31/2015 2328   APPEARANCEUR CLOUDY (A) 10/31/2015 2328   LABSPEC 1.021 10/31/2015 2328   PHURINE 5.0 10/31/2015 2328   GLUCOSEU NEGATIVE 10/31/2015 2328   HGBUR NEGATIVE 10/31/2015 2328   BILIRUBINUR NEGATIVE 10/31/2015 2328   KETONESUR NEGATIVE 10/31/2015 2328   PROTEINUR 100 (A) 10/31/2015 2328   UROBILINOGEN 0.2 08/16/2015 1940   NITRITE NEGATIVE 10/31/2015 2328   LEUKOCYTESUR MODERATE (A) 10/31/2015 2328   Sepsis Labs: @LABRCNTIP (procalcitonin:4,lacticidven:4) )No results found for this or any previous visit (from the past 240 hour(s)).   Radiological Exams on Admission: Ct Head Wo Contrast  Result Date: 10-25-2016 CLINICAL DATA:  Unresponsive.  Seizure. EXAM: CT HEAD WITHOUT CONTRAST TECHNIQUE: Contiguous axial images were obtained from the base of the skull through the vertex without intravenous contrast. COMPARISON:  11/01/2015 FINDINGS: Brain: There is a large acute parenchymal hemorrhage centered in the left thalamus which measures approximately 5.4 x 4.5 x 3.8 cm (estimated volume of 46 cc). There is intraventricular extension with a large amount of hemorrhage in the left greater than right lateral ventricles as well as third and fourth ventricles. There is moderate diffuse dilatation of the lateral ventricles consistent with acute hydrocephalus. Periventricular white matter hypodensities may reflect a combination of chronic small vessel ischemic disease and transependymal CSF flow. There is approximately 1.5 cm of rightward midline shift at the level of the thalami. Midline shift more superiorly at the level of the bodies of the  lateral ventricles measures 1.0 cm. There is partial effacement of the basilar cisterns. No inferior cerebellar tonsillar herniation. No extra-axial  fluid collection. Density in the right thalamus may represent a combination of acute edema as well as chronic encephalomalacia related to a known prior hemorrhage. Vascular: Calcified atherosclerosis at the skullbase. Intracranial arterial dolichoectasia. Skull: No fracture or focal osseous lesion. Sinuses/Orbits: Mild bilateral ethmoid sinus mucosal thickening and small bilateral maxillary sinus fluid levels. Clear mastoid air cells. Unremarkable orbits. Other: None. IMPRESSION: Large acute left thalamic hemorrhage with intraventricular extension and hydrocephalus. 10-15 mm rightward midline shift. Critical Value/emergent results were called by telephone at the time of interpretation on Oct 08, 2016 at 12:55 pm to Dr. Dorie Rank , who verbally acknowledged these results. Electronically Signed   By: Logan Bores M.D.   On: 10/08/2016 13:05     Assessment/Plan Principal Problem:   Intracranial hemorrhage/new onset seizure -Patient presents with unresponsive state and devastating intracranial hemorrhage in setting of known poor quality of life 2/2 prior CVA with left-sided spastic hemiparesis causing chronic pain -Previous determination made for DO NOT RESUSCITATE/DO NOT INTUBATE status and reconfirmed by family present at bedside -Neurology has discussed with patient's family at bedside and desire is to focus on comfort measures only -Anticipate hospital death but have consulted palliative medicine in the event this calms a prolonged situation and require transition to residential hospice -Palliative care end-of-life/comfort measures order set initiated:  IV morphine prn perceived pain/respiratory distress  IV Ativan prn seizures  Continue oxygen for comfort  No vital signs or labs  IV Robinul for excessive secretions  IV Thorazine for hiccups  Chaplain  services consulted  Active Problems:   Essential hypertension   Diabetes mellitus, type 2    DM (diabetes mellitus)   HLD (hyperlipidemia)  -Not addressing any active issues with above problems secondary to comfort measures instituted     DVT prophylaxis: Not applicable Code Status: DO NOT RESUSCITATE Family Communication: 2 family members at bedside-emotional support given Disposition Plan: Anticipate hospital death Consults called: Neurology/[place]    Benjimen Kelley L. ANP-BC [facility] Pager [phone]   If 7PM-7AM, please contact night-coverage [email or web address] Password [email or web address]  2016/10/08, 1:24 PM

## 2016-10-05 NOTE — ED Provider Notes (Signed)
[facility] DEPT Provider Note   CSN: [id number] Arrival date & time: 10-26-16  1043     History   Chief Complaint Chief Complaint  Patient presents with  . Seizures    HPI Destiny Robinson is a 61 y.o. female.  HPI Patient presented to the emergency room after a seizure. 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  Patient is a resident of [facility] [place]. She was last seen normal at about 0 9:30. At 0 945 patient was found staring and unresponsive. EMS noted the patient to have generalized stiffness with sonorous respirations. She was given 5 mg of Versed without significant change. During transport she had worsening symptoms and stiffness. Patient is unresponsive here in the emergency room Past Medical History:  Diagnosis Date  . Asthma ~ 1991  . Bronchitis   . 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 Complication of anesthesia 12/11/11   "angry, mean, hateful after" endoscopy & colonoscopy  . Depression   . Diabetes mellitus type 2, controlled ([place])   . GERD (gastroesophageal reflux disease)   . GI bleeding 12/21/11  . Hx of colonoscopy with polypectomy 12/11/11   "took out 7; couldn't get #8, that one was precancerous"  . Hypercholesterolemia   . Hypertension   . Kidney stones   . Pancreatitis   . Paralysis gastric   . Pneumonia 06/2011; 07/2011  . Sleep apnea    uses a cpap-oxygen at night  . Stroke [facility])     Patient Active Problem List   Diagnosis Date Noted  . Intracranial hemorrhage ([place]) 10-26-2016  . Spastic hemiplegia affecting nondominant side ([place]) 01/09/2016  . Chest pain 11/01/2015  . UTI (urinary tract infection) 11/01/2015  . Syncope 11/01/2015  . Altered mental status 08/08/2015  . Acute encephalopathy 08/08/2015  . Type 2 diabetes mellitus with vascular disease ([place]) 08/08/2015  . UTI (lower urinary tract infection)   . Other specified fever   . Malignant  hypertension   . ICH (intracerebral hemorrhage) ([place]) 07/29/2015  . Migraine with aura and without status migrainosus, not intractable 04/15/2015  . Cephalalgia 12/11/2014  . Headache 10/30/2014  . Left sided numbness 10/30/2014  . HLD (hyperlipidemia) 10/30/2014  . OSA on CPAP 10/30/2014  . Obesity 10/30/2014  . Essential hypertension   . Left hip pain   . Sensory disturbance 10/02/2014  . Morbid obesity ([place]) 10/02/2014  . Cerebrovascular accident ([place]) 10/01/2014  . CVA (cerebral infarction) 10/01/2014  . Acute bronchitis 05/14/2013  . GERD (gastroesophageal reflux disease) 04/22/2013  . Obstructive sleep apnea 11/19/2012  . Dyspnea on exertion 11/19/2012  . Abdominal pain 12/21/2011  . GIB (gastrointestinal bleeding) 12/21/2011  . Soft tissue infection 12/21/2011  . Depression 12/11/2011  . Nausea, vomiting and diarrhea 12/10/2011  . HTN (hypertension) 12/10/2011  . Diabetes mellitus, type 2 ([place]) 12/10/2011  . Pancreatitis     Past Surgical History:  Procedure Laterality Date  . ABDOMINAL HYSTERECTOMY  2002  . APPENDECTOMY  ~ 2002   "w/hysterectomy"  . [place]; 1986  . CHOLECYSTECTOMY  ~ 2002  . COLONOSCOPY  12/11/2011   Procedure: COLONOSCOPY;  Surgeon: Missy Sabins, MD;  Location: [facility];  Service: Endoscopy;  Laterality: N/A;  . COLONOSCOPY  04/26/2012   Procedure: COLONOSCOPY;  Surgeon: Wonda Horner, MD;  Location: [facility] ENDOSCOPY;  Service: Endoscopy;  Laterality: N/A;  apc  . ESOPHAGOGASTRODUODENOSCOPY  12/11/2011  Procedure: ESOPHAGOGASTRODUODENOSCOPY (EGD);  Surgeon: Missy Sabins, MD;  [facility] ENDOSCOPY;  Service: Endoscopy;  Laterality: N/A;  . HERNIA REPAIR     Umbilical  . KNEE ARTHROSCOPY WITH MEDIAL MENISECTOMY Right 03/29/2015   Procedure: ARTHROSCOPY KNEE, medial menisectomy, removal of loose body;  Surgeon: Frederik Pear, MD;  Location: [facility];  Service: Orthopedics;  Laterality: Right;  RIGHT KNEE ARTHROSCOPY  .  OPEN REDUCTION INTERNAL FIXATION (ORIF) DISTAL RADIAL FRACTURE Left 01/07/2015   Procedure: OPEN REDUCTION INTERNAL FIXATION (ORIF) LEFT DISTAL RADIAL FRACTURE;  Surgeon: Milly Jakob, MD;  Location: [facility];  Service: Orthopedics;  Laterality: Left;  . TONSILLECTOMY AND ADENOIDECTOMY  1988    OB History    No data available       Home Medications    Prior to Admission medications   Medication Sig Start Date End Date Taking? Authorizing Provider  aspirin EC 81 MG tablet Take 1 tablet (81 mg total) by mouth daily. Patient taking differently: Take 81 mg by mouth daily. Childrens OTC 10/29/15  Yes Dennie Bible, NP  fluticasone [facility]) 50 MCG/ACT nasal spray Place 2 sprays into both nostrils daily.   Yes Historical Provider, MD  gabapentin (NEURONTIN) 600 MG tablet Take 1,200 mg by mouth 3 (three) times daily.   Yes Historical Provider, MD  hydrALAZINE (APRESOLINE) 50 MG tablet Take 1 tablet (50 mg total) by mouth every 8 (eight) hours. 08/07/15  Yes Donzetta Starch, NP  insulin detemir (LEVEMIR) 100 UNIT/ML injection Inject 18 Units into the skin at bedtime. 08/07/15  Yes Historical Provider, MD  labetalol (NORMODYNE) 100 MG tablet Take 1 tablet (100 mg total) by mouth 2 (two) times daily. 08/10/15  Yes Thurnell Lose, MD  lisinopril (PRINIVIL,ZESTRIL) 20 MG tablet Take 20 mg by mouth daily. 09/16/16  Yes Historical Provider, MD  LORazepam (ATIVAN) 0.5 MG tablet Take 0.5 mg by mouth daily.   Yes Historical Provider, MD  LORazepam (ATIVAN) 1 MG tablet Take 1 mg by mouth 2 (two) times daily.   Yes Historical Provider, MD  losartan (COZAAR) 100 MG tablet Take 100 mg by mouth daily.   Yes Historical Provider, MD  metFORMIN (GLUCOPHAGE-XR) 500 MG 24 hr tablet Take 500 mg by mouth daily with breakfast.   Yes Historical Provider, MD  Olopatadine HCl (PAZEO) 0.7 % SOLN Apply 1 drop to eye daily.   Yes Historical Provider, MD  omeprazole (PRILOSEC) 40 MG capsule Take 40 mg by  mouth daily. 09/03/16  Yes Historical Provider, MD  tizanidine (ZANAFLEX) 2 MG capsule Take 2 mg by mouth 4 (four) times daily as needed for muscle spasms.   Yes Historical Provider, MD  traMADol (ULTRAM) 50 MG tablet Take 50 mg by mouth 3 (three) times daily.   Yes Historical Provider, MD  venlafaxine XR (EFFEXOR-XR) 150 MG 24 hr capsule Take 150 mg by mouth daily.   Yes Historical Provider, MD  acetaminophen (TYLENOL) 500 MG tablet Take 1,000 mg by mouth daily as needed for fever or headache (headache).     Historical Provider, MD  Alum & Mag Hydroxide-Simeth (ANTACID & ANTIGAS) [id number] MG/5ML SUSP Take 15 mLs by mouth every 4 (four) hours as needed for indigestion.    Historical Provider, MD  amLODipine (NORVASC) 5 MG tablet Take 5 mg by mouth daily. Reported on 01/09/2016    Historical Provider, MD  meclizine (ANTIVERT) 12.5 MG tablet Take 12.5 mg by mouth every 4 (four) hours as needed for dizziness (For  3 doses).    Historical Provider, MD  pravastatin (PRAVACHOL) 80 MG tablet Take 1 tablet (80 mg total) by mouth daily. Patient taking differently: Take 80 mg by mouth at bedtime.  08/07/15 08/06/16  Donzetta Starch, NP  PROAIR HFA 108 (90 BASE) MCG/ACT inhaler USE 2 PUFFS EVERY 6 HOURS AS NEEDED FOR WHEEZING    Deneise Lever, MD    Family History Family History  Problem Relation Age of Onset  . Alzheimer's disease Mother   . Stroke Mother   . Heart disease Father     Enlarged heart  . Colon cancer Sister     Social History Social History  Substance Use Topics  . Smoking status: Former Smoker    Packs/day: 0.12    Years: 6.00    Types: Cigarettes    Quit date: 12/09/1996  . Smokeless tobacco: Never Used  . Alcohol use No     Allergies   Aspirin; Codeine; Penicillins; Reglan [metoclopramide]; Xifaxan [rifaximin]; Zocor [simvastatin]; and Crestor [rosuvastatin]   Review of Systems Review of Systems  All other systems reviewed and are negative.    Physical Exam Updated  Vital Signs BP 192/83   Pulse 65   Resp 22   Ht 5\' 2"  (1.575 m)   Wt 78.5 kg   SpO2 91%   BMI 31.64 kg/m   Physical Exam  HENT:  Head: Normocephalic and atraumatic.  Right Ear: External ear normal.  Left Ear: External ear normal.  Eyes: Conjunctivae are normal. Right eye exhibits no discharge. Left eye exhibits no discharge. No scleral icterus.  Neck: Neck supple. No tracheal deviation present.  Cardiovascular: Normal rate, regular rhythm and intact distal pulses.   Pulmonary/Chest: Effort normal and breath sounds normal. No stridor. No respiratory distress. She has no wheezes. She has no rales.  Abdominal: Soft. Bowel sounds are normal. She exhibits no distension. There is no tenderness. There is no rebound and no guarding.  Musculoskeletal: She exhibits no edema or tenderness.  Neurological: She is unresponsive. She displays atrophy. No cranial nerve deficit (no facial droop, ). She exhibits abnormal muscle tone. She displays seizure activity.  Patient has generalized stiffness, she is not awake or following commands. Left side appears to have chronic contractures and atrophy  Skin: Skin is warm. No rash noted. She is diaphoretic.  Psychiatric: She has a normal mood and affect.  Nursing note and vitals reviewed.    ED Treatments / Results  Labs (all labs ordered are listed, but only abnormal results are displayed) Labs Reviewed  CBC - Abnormal; Notable for the following:       Result Value   RBC 5.43 (*)    Hemoglobin 15.4 (*)    HCT 46.8 (*)    Platelets 146 (*)    All other components within normal limits  COMPREHENSIVE METABOLIC PANEL - Abnormal; Notable for the following:    Glucose, Bld 182 (*)    All other components within normal limits  I-STAT ARTERIAL BLOOD GAS, ED - Abnormal; Notable for the following:    pO2, Arterial 79.0 (*)    Bicarbonate 28.4 (*)    Acid-Base Excess 3.0 (*)    All other components within normal limits  CBG MONITORING, ED - Abnormal;  Notable for the following:    Glucose-Capillary 173 (*)    All other components within normal limits  ETHANOL  PROTIME-INR  APTT  DIFFERENTIAL  I-STAT TROPOININ, ED     Radiology Ct Head Wo Contrast  Result  Date: 10-16-2016 CLINICAL DATA:  Unresponsive.  Seizure. EXAM: CT HEAD WITHOUT CONTRAST TECHNIQUE: Contiguous axial images were obtained from the base of the skull through the vertex without intravenous contrast. COMPARISON:  11/01/2015 FINDINGS: Brain: There is a large acute parenchymal hemorrhage centered in the left thalamus which measures approximately 5.4 x 4.5 x 3.8 cm (estimated volume of 46 cc). There is intraventricular extension with a large amount of hemorrhage in the left greater than right lateral ventricles as well as third and fourth ventricles. There is moderate diffuse dilatation of the lateral ventricles consistent with acute hydrocephalus. Periventricular white matter hypodensities may reflect a combination of chronic small vessel ischemic disease and transependymal CSF flow. There is approximately 1.5 cm of rightward midline shift at the level of the thalami. Midline shift more superiorly at the level of the bodies of the lateral ventricles measures 1.0 cm. There is partial effacement of the basilar cisterns. No inferior cerebellar tonsillar herniation. No extra-axial fluid collection. Density in the right thalamus may represent a combination of acute edema as well as chronic encephalomalacia related to a known prior hemorrhage. Vascular: Calcified atherosclerosis at the skullbase. Intracranial arterial dolichoectasia. Skull: No fracture or focal osseous lesion. Sinuses/Orbits: Mild bilateral ethmoid sinus mucosal thickening and small bilateral maxillary sinus fluid levels. Clear mastoid air cells. Unremarkable orbits. Other: None. IMPRESSION: Large acute left thalamic hemorrhage with intraventricular extension and hydrocephalus. 10-15 mm rightward midline shift. Critical  Value/emergent results were called by telephone at the time of interpretation on October 16, 2016 at 12:55 pm to Dr. Dorie Rank , who verbally acknowledged these results. Electronically Signed   By: Logan Bores M.D.   On: 10-16-2016 13:05    Procedures .Critical Care Performed by: Dorie Rank Authorized by: Dorie Rank   Critical care provider statement:    Critical care time (minutes):  45   Critical care was time spent personally by me on the following activities:  Discussions with consultants, evaluation of patient's response to treatment, examination of patient, ordering and performing treatments and interventions, ordering and review of laboratory studies, ordering and review of radiographic studies, pulse oximetry, re-evaluation of patient's condition, obtaining history from patient or surrogate and review of old charts   (including critical care time)  Medications Ordered in ED Medications  labetalol (NORMODYNE,TRANDATE) injection 20 mg (not administered)  acetaminophen (TYLENOL) tablet 650 mg (not administered)    Or  acetaminophen (TYLENOL) suppository 650 mg (not administered)  morphine 4 MG/ML injection 1 mg (not administered)  chlorproMAZINE (THORAZINE) 12.5 mg in sodium chloride 0.9 % 25 mL IVPB (not administered)  glycopyrrolate (ROBINUL) tablet 1 mg (not administered)    Or  glycopyrrolate (ROBINUL) injection 0.2 mg (not administered)    Or  glycopyrrolate (ROBINUL) injection 0.2 mg (not administered)  LORazepam (ATIVAN) injection 1 mg (not administered)  LORazepam (ATIVAN) injection 1 mg (1 mg Intravenous Given 2016/10/16 1125)  ondansetron (ZOFRAN) injection 4 mg (4 mg Intravenous Given 16-Oct-2016 1211)     Initial Impression / Assessment and Plan / ED Course  I have reviewed the triage vital signs and the nursing notes.  Pertinent labs & imaging results that were available during my care of the patient were reviewed by me and considered in my medical decision making (see  chart for details).  Clinical Course as of Sep 28 1339  Sun Sep 27, 2016  1110 Patient presents with presumed seizure. She is oxygenating at this point. I'm concerned about the possibility of hypoxia and  airway compromise. We'll need  to monitor closely. If her symptoms do not improve she may end up requiring intubation.  [id number] Pt is now vomiting again.  Concerning for bleed.  Discussed with family about intubation.  Pt does not wish to be on a ventilator.    Pt has not gone for CT.  Will call to expedite  [JK]  1248 I reviewed the films.  Obvious intracerebral hemorrhage on CT.  Will update family when they return.  We will need to discuss code status.  [id number] Discussed findings with family.  She is DNR/DNI.   This hemorrhage is extremely large.  Will consult with neuro hospitalist to discuss whether pure comfort care or should initiate HTN management  [JK]    Clinical Course User Index [JK] Dorie Rank, MD    Pt unfortunately has a large  thalamic hemorrhage. This is not a Survivable injury.  I discussed the findings with the patient's family members. Dr. Leonel Ramsay kindly evaluated the patient as well and confirmed this with the family.  Plan is to mid the patient to the hospital for comfort care.      Final Clinical Impressions(s) / ED Diagnoses   Final diagnoses:  Left-sided nontraumatic intracerebral hemorrhage, unspecified cerebral location [facility])      Dorie Rank, MD 2016-10-23 1341

## 2016-10-05 NOTE — Consult Note (Signed)
Neurology Consultation Reason for Consult: [place] Referring Physician: Hillard Danker  CC: ICH  History is obtained from:patient's family  HPI: Destiny Robinson is a 61 y.o. female with unclear LKW who was seen to have a "seizure" at her nursing home.  She was brought to the ER where she was found to have a massive deep ICH.   She had poor quality of life at baseline due to previous ICH 14 months ago. She had already decided to be DNR/DNI.    LKW: unclear tpa given?: no, ICH ICH Score: 4    ROS: Unable to assess secondary to patient's altered mental status.    Past Medical History:  Diagnosis Date  . Asthma ~ 1991  . Bronchitis   . 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 Complication of anesthesia 12/11/11   "angry, mean, hateful after" endoscopy & colonoscopy  . Depression   . Diabetes mellitus type 2, controlled ([place])   . GERD (gastroesophageal reflux disease)   . GI bleeding 12/21/11  . Hx of colonoscopy with polypectomy 12/11/11   "took out 7; couldn't get #8, that one was precancerous"  . Hypercholesterolemia   . Hypertension   . Kidney stones   . Pancreatitis   . Paralysis gastric   . Pneumonia 06/2011; 07/2011  . Sleep apnea    uses a cpap-oxygen at night  . Stroke [facility])      Family History  Problem Relation Age of Onset  . Alzheimer's disease Mother   . Stroke Mother   . Heart disease Father     Enlarged heart  . Colon cancer Sister      Social History:  reports that she quit smoking about 19 years ago. Her smoking use included Cigarettes. She has a 0.72 pack-year smoking history. She has never used smokeless tobacco. She reports that she does not drink alcohol or use drugs.   Exam: Current vital signs: BP 165/83   Pulse 84   Resp 17   Ht 5\' 2"  (1.575 m)   Wt 78.5 kg (173 lb)   SpO2 (!) 89%   BMI 31.64 kg/m  Vital signs in last 24 hours: Pulse Rate:  [83-90] 84 (12/24 1130) Resp:  [17-22] 17 (12/24 1130) BP:  (165-190)/(83-98) 165/83 (12/24 1130) SpO2:  [89 %-98 %] 89 % (12/24 1130) Weight:  [78.5 kg (173 lb)] 78.5 kg (173 lb) (12/24 1056)   Physical Exam  Constitutional: Appears older than stated age Psych: comatose Eyes: No scleral injection HENT: No OP obstrucion Head: Normocephalic.  Cardiovascular: Normal rate and regular rhythm.  Respiratory: sonorus respirations.  GI: Soft.  No distension. There is no tenderness.  Skin: WDI  Neuro: Mental Status: Patient is comatose, does not open eyes.  Cranial Nerves: [id number] not blink to threat.  Pupils are equal, round, and reactive to light.   V:VII: intact corneals.  Motor: Extension to nox stim bilaterally Sensory: As above Cerebellar: Does not perform.   I have reviewed labs in epic and the results pertinent to this consultation are: INR wnl  I have reviewed the images obtained:CT head - devastating hemorrhage.   Impression: 61 yo F with devastating ICH. She will not survive this. She was already DNR prior to this event. Recommend only comfort care.   Recommendations: 1) Please call if we can be of any further assistance.    Roland Rack, MD [facility] [phone]  If 7pm- 7am, please page neurology on  call as listed in [place].

## 2016-10-05 NOTE — Discharge Summary (Signed)
Death Summary  Destiny Robinson [id number] DOB: 1956-05-12 DOA: 10/18/16  PCP: Tonia Brooms, MD   Admit date: 10/18/16 Date of Death: Oct 18, 2016  Final Diagnoses:  Principal Problem:   Intracranial hemorrhage ([place]) Active Problems:   Essential hypertension   Diabetes mellitus, type 2 ([place])   HLD (hyperlipidemia)    History of present illness:  Destiny Robinson is a 61 y.o. female with medical history significant for prior intracranial hemorrhage/CVA with resultant left hemiparesis residing in nursing facility,  hypertension, diabetes, obesity, dyslipidemia, depression and reflux. She was also being treated by Dr. Bonney Aid for pain related to severe left spastic hemiplegia and had received a Botox injection on 08/13/16. EMS was called to nursing facility when patient was completely unresponsive with full body stiffness and snoring respirations documented. Last seen normal 9:30 AM was found at 9:45 AM as described above. Was given 5 of Versed by EMS due to concerns for seizure activity with no change in status. Patient remained unresponsive upon arrival to ER. Stat CT of the head revealed large acute left thalamic hemorrhage with intraventricular extension and hydrocephalus with attended 15 mm rightward midline shift. Results were communicated to the ER physician as well to the stroke neurologist on call Dr. Leonel Ramsay. Patient with grave prognosis. This communicated to the family by both the EDP and the neurologist and plan is to begin comfort measures.  Hospital Course:  Patient was admitted to the hospital with focus on comfort. Orders were written to administer comfort medications including morphine for pain or air hunger, IV Ativan for seizure activity, and/or IV Robinul for excessive secretions. Patient was eventually transported from the ER to 6 [address]. Within several hours of admission patient developed agonal respirations and was given low-dose morphine for air hunger. Within a few  minutes patient became unresponsive without palpable pulse or auscultated heart tones and she was. She was pronounced deceased at 3:30 PM. Family was at bedside and chaplain had been urgently paged to provide emotional and spiritual support.   Time: 3:30 PM  Signed:  Dilcia Rybarczyk L. ANP  [facility] Oct 18, 2016, 3:49 PM

## 2016-10-05 DEATH — deceased

## 2016-10-19 ENCOUNTER — Ambulatory Visit: Payer: Medicare Other | Admitting: Internal Medicine

## 2016-10-27 ENCOUNTER — Ambulatory Visit: Payer: Medicare Other | Admitting: Neurology
